# Patient Record
Sex: Female | Born: 1955
Health system: Southern US, Community
[De-identification: ages and names within clinical notes are randomized; demographics above are authoritative.]

## PROBLEM LIST (undated history)

## (undated) DIAGNOSIS — G4733 Obstructive sleep apnea (adult) (pediatric): Secondary | ICD-10-CM

## (undated) DIAGNOSIS — M1990 Unspecified osteoarthritis, unspecified site: Secondary | ICD-10-CM

## (undated) DIAGNOSIS — I10 Essential (primary) hypertension: Secondary | ICD-10-CM

## (undated) DIAGNOSIS — H269 Unspecified cataract: Secondary | ICD-10-CM

## (undated) DIAGNOSIS — R7301 Impaired fasting glucose: Secondary | ICD-10-CM

## (undated) DIAGNOSIS — D649 Anemia, unspecified: Secondary | ICD-10-CM

## (undated) DIAGNOSIS — E119 Type 2 diabetes mellitus without complications: Secondary | ICD-10-CM

## (undated) DIAGNOSIS — E785 Hyperlipidemia, unspecified: Secondary | ICD-10-CM

## (undated) DIAGNOSIS — E079 Disorder of thyroid, unspecified: Secondary | ICD-10-CM

## (undated) DIAGNOSIS — K219 Gastro-esophageal reflux disease without esophagitis: Secondary | ICD-10-CM

## (undated) DIAGNOSIS — G709 Myoneural disorder, unspecified: Secondary | ICD-10-CM

## (undated) DIAGNOSIS — E039 Hypothyroidism, unspecified: Secondary | ICD-10-CM

## (undated) DIAGNOSIS — R011 Cardiac murmur, unspecified: Secondary | ICD-10-CM

## (undated) DIAGNOSIS — G4730 Sleep apnea, unspecified: Secondary | ICD-10-CM

## (undated) HISTORY — DX: Obstructive sleep apnea (adult) (pediatric): G47.33

## (undated) HISTORY — DX: Anemia, unspecified: D64.9

## (undated) HISTORY — DX: Disorder of thyroid, unspecified: E07.9

## (undated) HISTORY — DX: Unspecified cataract: H26.9

## (undated) HISTORY — DX: Hyperlipidemia, unspecified: E78.5

## (undated) HISTORY — DX: Unspecified osteoarthritis, unspecified site: M19.90

## (undated) HISTORY — DX: Essential (primary) hypertension: I10

## (undated) HISTORY — DX: Sleep apnea, unspecified: G47.30

## (undated) HISTORY — PX: EYE SURGERY: SHX253

## (undated) HISTORY — DX: Myoneural disorder, unspecified: G70.9

## (undated) HISTORY — DX: Cardiac murmur, unspecified: R01.1

## (undated) HISTORY — DX: Type 2 diabetes mellitus without complications: E11.9

## (undated) HISTORY — PX: COLONOSCOPY: SHX174

## (undated) HISTORY — DX: Impaired fasting glucose: R73.01

## (undated) HISTORY — DX: Gastro-esophageal reflux disease without esophagitis: K21.9

---
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
## 1998-07-08 ENCOUNTER — Ambulatory Visit (HOSPITAL_COMMUNITY): Admission: RE | Admit: 1998-07-08 | Discharge: 1998-07-08 | Payer: Self-pay | Admitting: Internal Medicine

## 1999-10-16 ENCOUNTER — Encounter: Payer: Self-pay | Admitting: Internal Medicine

## 1999-10-16 ENCOUNTER — Encounter: Admission: RE | Admit: 1999-10-16 | Discharge: 1999-10-16 | Payer: Self-pay | Admitting: Internal Medicine

## 2000-03-12 ENCOUNTER — Other Ambulatory Visit: Admission: RE | Admit: 2000-03-12 | Discharge: 2000-03-12 | Payer: Self-pay | Admitting: Internal Medicine

## 2000-10-25 ENCOUNTER — Encounter: Admission: RE | Admit: 2000-10-25 | Discharge: 2000-10-25 | Payer: Self-pay | Admitting: Internal Medicine

## 2000-10-25 ENCOUNTER — Encounter: Payer: Self-pay | Admitting: Internal Medicine

## 2001-08-04 ENCOUNTER — Other Ambulatory Visit: Admission: RE | Admit: 2001-08-04 | Discharge: 2001-08-04 | Payer: Self-pay | Admitting: Internal Medicine

## 2001-08-11 ENCOUNTER — Encounter: Admission: RE | Admit: 2001-08-11 | Discharge: 2001-09-14 | Payer: Self-pay | Admitting: Internal Medicine

## 2001-11-15 ENCOUNTER — Encounter: Admission: RE | Admit: 2001-11-15 | Discharge: 2001-11-15 | Payer: Self-pay | Admitting: Internal Medicine

## 2001-11-15 ENCOUNTER — Encounter: Payer: Self-pay | Admitting: Internal Medicine

## 2002-01-24 ENCOUNTER — Encounter: Payer: Self-pay | Admitting: Internal Medicine

## 2002-03-17 ENCOUNTER — Encounter: Payer: Self-pay | Admitting: Internal Medicine

## 2002-03-17 ENCOUNTER — Ambulatory Visit (HOSPITAL_COMMUNITY): Admission: RE | Admit: 2002-03-17 | Discharge: 2002-03-17 | Payer: Self-pay | Admitting: Internal Medicine

## 2002-11-20 ENCOUNTER — Ambulatory Visit (HOSPITAL_BASED_OUTPATIENT_CLINIC_OR_DEPARTMENT_OTHER): Admission: RE | Admit: 2002-11-20 | Discharge: 2002-11-20 | Payer: Self-pay | Admitting: Internal Medicine

## 2002-11-28 ENCOUNTER — Encounter: Admission: RE | Admit: 2002-11-28 | Discharge: 2002-11-28 | Payer: Self-pay | Admitting: Internal Medicine

## 2002-11-28 ENCOUNTER — Encounter: Payer: Self-pay | Admitting: Internal Medicine

## 2003-09-20 ENCOUNTER — Encounter: Payer: Self-pay | Admitting: Family Medicine

## 2003-10-16 ENCOUNTER — Other Ambulatory Visit: Admission: RE | Admit: 2003-10-16 | Discharge: 2003-10-16 | Payer: Self-pay | Admitting: Obstetrics & Gynecology

## 2003-12-14 ENCOUNTER — Encounter: Admission: RE | Admit: 2003-12-14 | Discharge: 2003-12-14 | Payer: Self-pay | Admitting: Obstetrics & Gynecology

## 2004-10-09 ENCOUNTER — Ambulatory Visit: Payer: Self-pay | Admitting: Family Medicine

## 2004-12-17 ENCOUNTER — Other Ambulatory Visit: Admission: RE | Admit: 2004-12-17 | Discharge: 2004-12-17 | Payer: Self-pay | Admitting: Obstetrics & Gynecology

## 2004-12-17 ENCOUNTER — Encounter: Admission: RE | Admit: 2004-12-17 | Discharge: 2004-12-17 | Payer: Self-pay | Admitting: Obstetrics & Gynecology

## 2005-01-20 ENCOUNTER — Ambulatory Visit: Payer: Self-pay | Admitting: Family Medicine

## 2005-02-03 ENCOUNTER — Ambulatory Visit: Payer: Self-pay | Admitting: Family Medicine

## 2005-09-03 ENCOUNTER — Ambulatory Visit: Payer: Self-pay | Admitting: Family Medicine

## 2005-09-25 ENCOUNTER — Encounter: Payer: Self-pay | Admitting: Family Medicine

## 2005-09-25 ENCOUNTER — Ambulatory Visit (HOSPITAL_COMMUNITY): Admission: RE | Admit: 2005-09-25 | Discharge: 2005-09-25 | Payer: Self-pay | Admitting: Family Medicine

## 2005-12-09 ENCOUNTER — Encounter: Payer: Self-pay | Admitting: Family Medicine

## 2006-01-13 ENCOUNTER — Encounter: Admission: RE | Admit: 2006-01-13 | Discharge: 2006-01-13 | Payer: Self-pay | Admitting: Obstetrics & Gynecology

## 2006-01-25 ENCOUNTER — Other Ambulatory Visit: Admission: RE | Admit: 2006-01-25 | Discharge: 2006-01-25 | Payer: Self-pay | Admitting: Obstetrics & Gynecology

## 2007-01-06 ENCOUNTER — Ambulatory Visit: Payer: Self-pay | Admitting: Family Medicine

## 2007-01-06 DIAGNOSIS — D649 Anemia, unspecified: Secondary | ICD-10-CM | POA: Insufficient documentation

## 2007-01-06 DIAGNOSIS — E039 Hypothyroidism, unspecified: Secondary | ICD-10-CM | POA: Insufficient documentation

## 2007-01-06 DIAGNOSIS — I10 Essential (primary) hypertension: Secondary | ICD-10-CM | POA: Insufficient documentation

## 2007-01-06 DIAGNOSIS — E785 Hyperlipidemia, unspecified: Secondary | ICD-10-CM | POA: Insufficient documentation

## 2007-01-08 ENCOUNTER — Encounter: Payer: Self-pay | Admitting: Family Medicine

## 2007-01-10 LAB — CONVERTED CEMR LAB
AST: 14 units/L (ref 0–37)
Albumin: 4.4 g/dL (ref 3.5–5.2)
Alkaline Phosphatase: 67 units/L (ref 39–117)
BUN: 13 mg/dL (ref 6–23)
Calcium: 8.8 mg/dL (ref 8.4–10.5)
Creatinine, Ser: 0.72 mg/dL (ref 0.40–1.20)
Glucose, Bld: 65 mg/dL — ABNORMAL LOW (ref 70–99)
HCT: 45 % (ref 36.0–46.0)
HDL: 51 mg/dL (ref >39)
Hemoglobin: 13.1 g/dL (ref 12.0–15.0)
LDL Cholesterol: 74 mg/dL (ref 0–99)
MCHC: 29.1 g/dL — ABNORMAL LOW (ref 30.0–36.0)
MCV: 98.7 fL (ref 78.0–100.0)
Potassium: 3.3 meq/L — ABNORMAL LOW (ref 3.5–5.3)
RBC: 4.56 M/uL (ref 3.87–5.11)
RDW: 17.8 % — ABNORMAL HIGH (ref 11.5–14.0)
TSH: 3.115 microintl units/mL (ref 0.350–5.50)
Total CHOL/HDL Ratio: 3
Triglycerides: 139 mg/dL (ref <150)

## 2007-01-20 ENCOUNTER — Ambulatory Visit: Payer: Self-pay | Admitting: Internal Medicine

## 2007-01-25 ENCOUNTER — Encounter: Admission: RE | Admit: 2007-01-25 | Discharge: 2007-01-25 | Payer: Self-pay | Admitting: Obstetrics & Gynecology

## 2007-01-28 ENCOUNTER — Encounter: Payer: Self-pay | Admitting: Family Medicine

## 2007-02-03 ENCOUNTER — Ambulatory Visit: Payer: Self-pay | Admitting: Internal Medicine

## 2007-02-07 ENCOUNTER — Encounter: Payer: Self-pay | Admitting: Family Medicine

## 2007-02-07 ENCOUNTER — Telehealth: Payer: Self-pay | Admitting: Family Medicine

## 2007-03-02 ENCOUNTER — Other Ambulatory Visit: Admission: RE | Admit: 2007-03-02 | Discharge: 2007-03-02 | Payer: Self-pay | Admitting: Obstetrics & Gynecology

## 2007-04-05 ENCOUNTER — Encounter: Payer: Self-pay | Admitting: Family Medicine

## 2007-11-08 ENCOUNTER — Emergency Department (HOSPITAL_COMMUNITY): Admission: EM | Admit: 2007-11-08 | Discharge: 2007-11-08 | Payer: Self-pay | Admitting: Emergency Medicine

## 2007-11-10 HISTORY — PX: OTHER SURGICAL HISTORY: SHX169

## 2007-12-02 ENCOUNTER — Encounter: Admission: RE | Admit: 2007-12-02 | Discharge: 2007-12-02 | Payer: Self-pay | Admitting: Internal Medicine

## 2007-12-14 ENCOUNTER — Encounter: Admission: RE | Admit: 2007-12-14 | Discharge: 2008-01-25 | Payer: Self-pay | Admitting: Internal Medicine

## 2007-12-22 ENCOUNTER — Ambulatory Visit: Payer: Self-pay | Admitting: Family Medicine

## 2007-12-22 DIAGNOSIS — K5289 Other specified noninfective gastroenteritis and colitis: Secondary | ICD-10-CM | POA: Insufficient documentation

## 2008-01-27 ENCOUNTER — Encounter: Admission: RE | Admit: 2008-01-27 | Discharge: 2008-01-27 | Payer: Self-pay | Admitting: Obstetrics & Gynecology

## 2008-01-31 ENCOUNTER — Encounter: Payer: Self-pay | Admitting: Family Medicine

## 2008-02-06 ENCOUNTER — Encounter: Payer: Self-pay | Admitting: Family Medicine

## 2008-02-07 DIAGNOSIS — E1169 Type 2 diabetes mellitus with other specified complication: Secondary | ICD-10-CM | POA: Insufficient documentation

## 2008-02-07 DIAGNOSIS — E1122 Type 2 diabetes mellitus with diabetic chronic kidney disease: Secondary | ICD-10-CM | POA: Insufficient documentation

## 2008-02-07 DIAGNOSIS — E669 Obesity, unspecified: Secondary | ICD-10-CM | POA: Insufficient documentation

## 2008-02-07 LAB — CONVERTED CEMR LAB
BUN: 18 mg/dL (ref 6–23)
CO2: 24 meq/L (ref 19–32)
Calcium: 9 mg/dL (ref 8.4–10.5)
Chloride: 101 meq/L (ref 96–112)
Cholesterol: 152 mg/dL (ref 0–200)
Creatinine, Ser: 0.9 mg/dL (ref 0.40–1.20)
HDL: 49 mg/dL (ref >39)
Total Bilirubin: 0.5 mg/dL (ref 0.3–1.2)
Total CHOL/HDL Ratio: 3.1
VLDL: 29 mg/dL (ref 0–40)

## 2008-02-10 ENCOUNTER — Ambulatory Visit: Payer: Self-pay | Admitting: Family Medicine

## 2008-02-10 ENCOUNTER — Encounter: Admission: RE | Admit: 2008-02-10 | Discharge: 2008-02-10 | Payer: Self-pay | Admitting: Family Medicine

## 2008-02-10 DIAGNOSIS — M25569 Pain in unspecified knee: Secondary | ICD-10-CM | POA: Insufficient documentation

## 2008-03-05 ENCOUNTER — Telehealth: Payer: Self-pay | Admitting: Family Medicine

## 2008-03-08 ENCOUNTER — Telehealth: Payer: Self-pay | Admitting: Family Medicine

## 2008-03-10 ENCOUNTER — Encounter: Admission: RE | Admit: 2008-03-10 | Discharge: 2008-03-10 | Payer: Self-pay | Admitting: Family Medicine

## 2008-03-12 ENCOUNTER — Telehealth: Payer: Self-pay | Admitting: Family Medicine

## 2008-03-21 ENCOUNTER — Other Ambulatory Visit: Admission: RE | Admit: 2008-03-21 | Discharge: 2008-03-21 | Payer: Self-pay | Admitting: Obstetrics & Gynecology

## 2008-05-02 ENCOUNTER — Telehealth: Payer: Self-pay | Admitting: Family Medicine

## 2008-05-03 ENCOUNTER — Encounter: Payer: Self-pay | Admitting: Family Medicine

## 2008-05-14 ENCOUNTER — Encounter: Payer: Self-pay | Admitting: Family Medicine

## 2008-05-29 ENCOUNTER — Ambulatory Visit (HOSPITAL_BASED_OUTPATIENT_CLINIC_OR_DEPARTMENT_OTHER): Admission: RE | Admit: 2008-05-29 | Discharge: 2008-05-29 | Payer: Self-pay | Admitting: Orthopedic Surgery

## 2008-07-18 ENCOUNTER — Telehealth: Payer: Self-pay | Admitting: Family Medicine

## 2008-07-30 ENCOUNTER — Encounter: Admission: RE | Admit: 2008-07-30 | Discharge: 2008-09-14 | Payer: Self-pay | Admitting: Orthopedic Surgery

## 2009-01-28 ENCOUNTER — Encounter: Admission: RE | Admit: 2009-01-28 | Discharge: 2009-01-28 | Payer: Self-pay | Admitting: Obstetrics & Gynecology

## 2009-01-29 ENCOUNTER — Encounter: Payer: Self-pay | Admitting: Family Medicine

## 2009-02-26 ENCOUNTER — Telehealth (INDEPENDENT_AMBULATORY_CARE_PROVIDER_SITE_OTHER): Payer: Self-pay | Admitting: *Deleted

## 2009-03-18 ENCOUNTER — Encounter: Payer: Self-pay | Admitting: Family Medicine

## 2009-03-19 ENCOUNTER — Ambulatory Visit: Payer: Self-pay | Admitting: Family Medicine

## 2009-03-19 LAB — CONVERTED CEMR LAB
ALT: 20 units/L (ref 0–35)
AST: 17 units/L (ref 0–37)
Alkaline Phosphatase: 62 units/L (ref 39–117)
Cholesterol: 189 mg/dL (ref 0–200)
Creatinine, Ser: 0.83 mg/dL (ref 0.40–1.20)
HCT: 36.8 % (ref 36.0–46.0)
LDL Cholesterol: 92 mg/dL (ref 0–99)
MCHC: 32.6 g/dL (ref 30.0–36.0)
MCV: 89.5 fL (ref 78.0–100.0)
Platelets: 202 10*3/uL (ref 150–400)
RDW: 13.8 % (ref 11.5–15.5)
Sodium: 139 meq/L (ref 135–145)
TSH: 1.899 microintl units/mL (ref 0.350–4.500)
Total Bilirubin: 0.5 mg/dL (ref 0.3–1.2)
Total CHOL/HDL Ratio: 3.4
VLDL: 41 mg/dL — ABNORMAL HIGH (ref 0–40)

## 2009-03-21 ENCOUNTER — Encounter: Payer: Self-pay | Admitting: Family Medicine

## 2009-03-21 LAB — CONVERTED CEMR LAB: Calcium Ionized: 1.25 mmol/L (ref 1.12–1.32)

## 2009-05-08 ENCOUNTER — Other Ambulatory Visit: Admission: RE | Admit: 2009-05-08 | Discharge: 2009-05-08 | Payer: Self-pay | Admitting: Obstetrics & Gynecology

## 2010-02-04 ENCOUNTER — Encounter: Admission: RE | Admit: 2010-02-04 | Discharge: 2010-02-04 | Payer: Self-pay | Admitting: Obstetrics & Gynecology

## 2010-02-05 LAB — HM MAMMOGRAPHY: HM Mammogram: NORMAL

## 2010-03-11 ENCOUNTER — Telehealth: Payer: Self-pay | Admitting: Family Medicine

## 2010-03-14 ENCOUNTER — Encounter: Payer: Self-pay | Admitting: Family Medicine

## 2010-03-16 LAB — CONVERTED CEMR LAB
ALT: 20 units/L (ref 0–35)
Alkaline Phosphatase: 61 units/L (ref 39–117)
CO2: 23 meq/L (ref 19–32)
Cholesterol: 221 mg/dL — ABNORMAL HIGH (ref 0–200)
Creatinine, Ser: 0.83 mg/dL (ref 0.40–1.20)
Total Bilirubin: 0.5 mg/dL (ref 0.3–1.2)
Total CHOL/HDL Ratio: 4.7
VLDL: 43 mg/dL — ABNORMAL HIGH (ref 0–40)

## 2010-03-18 ENCOUNTER — Ambulatory Visit: Payer: Self-pay | Admitting: Family Medicine

## 2010-03-18 ENCOUNTER — Encounter: Admission: RE | Admit: 2010-03-18 | Discharge: 2010-03-18 | Payer: Self-pay | Admitting: Family Medicine

## 2010-03-18 DIAGNOSIS — M75102 Unspecified rotator cuff tear or rupture of left shoulder, not specified as traumatic: Secondary | ICD-10-CM | POA: Insufficient documentation

## 2010-03-18 DIAGNOSIS — M25519 Pain in unspecified shoulder: Secondary | ICD-10-CM | POA: Insufficient documentation

## 2010-03-18 DIAGNOSIS — R209 Unspecified disturbances of skin sensation: Secondary | ICD-10-CM | POA: Insufficient documentation

## 2010-05-22 ENCOUNTER — Other Ambulatory Visit: Admission: RE | Admit: 2010-05-22 | Discharge: 2010-05-22 | Payer: Self-pay | Admitting: Obstetrics & Gynecology

## 2010-06-17 ENCOUNTER — Telehealth: Payer: Self-pay | Admitting: Family Medicine

## 2010-07-02 ENCOUNTER — Encounter: Admission: RE | Admit: 2010-07-02 | Discharge: 2010-08-19 | Payer: Self-pay | Admitting: Family Medicine

## 2010-07-04 ENCOUNTER — Encounter: Payer: Self-pay | Admitting: Family Medicine

## 2010-07-31 ENCOUNTER — Ambulatory Visit: Payer: Self-pay | Admitting: Family Medicine

## 2010-08-01 ENCOUNTER — Encounter: Payer: Self-pay | Admitting: Family Medicine

## 2010-08-01 LAB — CONVERTED CEMR LAB
Alkaline Phosphatase: 81 units/L (ref 39–117)
BUN: 21 mg/dL (ref 6–23)
Glucose, Bld: 119 mg/dL — ABNORMAL HIGH (ref 70–99)
HDL: 43 mg/dL (ref >39)
LDL Cholesterol: 106 mg/dL — ABNORMAL HIGH (ref 0–99)
Sodium: 142 meq/L (ref 135–145)
Total Bilirubin: 0.5 mg/dL (ref 0.3–1.2)
Triglycerides: 216 mg/dL — ABNORMAL HIGH (ref <150)
VLDL: 43 mg/dL — ABNORMAL HIGH (ref 0–40)

## 2010-08-07 ENCOUNTER — Encounter: Payer: Self-pay | Admitting: Family Medicine

## 2010-08-18 ENCOUNTER — Encounter: Payer: Self-pay | Admitting: Family Medicine

## 2010-08-28 ENCOUNTER — Encounter (INDEPENDENT_AMBULATORY_CARE_PROVIDER_SITE_OTHER): Payer: Self-pay | Admitting: *Deleted

## 2010-09-02 ENCOUNTER — Ambulatory Visit (HOSPITAL_BASED_OUTPATIENT_CLINIC_OR_DEPARTMENT_OTHER): Admission: RE | Admit: 2010-09-02 | Discharge: 2010-09-02 | Payer: Self-pay | Admitting: Family Medicine

## 2010-09-02 ENCOUNTER — Encounter: Payer: Self-pay | Admitting: Family Medicine

## 2010-09-26 ENCOUNTER — Telehealth: Payer: Self-pay | Admitting: Family Medicine

## 2010-09-26 DIAGNOSIS — G4733 Obstructive sleep apnea (adult) (pediatric): Secondary | ICD-10-CM | POA: Insufficient documentation

## 2010-10-20 ENCOUNTER — Ambulatory Visit: Payer: Self-pay | Admitting: Internal Medicine

## 2010-10-20 DIAGNOSIS — K602 Anal fissure, unspecified: Secondary | ICD-10-CM | POA: Insufficient documentation

## 2010-11-13 ENCOUNTER — Encounter: Payer: Self-pay | Admitting: Internal Medicine

## 2010-11-26 ENCOUNTER — Encounter: Payer: Self-pay | Admitting: Family Medicine

## 2010-12-09 NOTE — Letter (Signed)
Summary: Letter to Dr Liliane Bade, [facility]  Page January 24, 2002    Madison Hickman, M.D.  Theatre stage manager at [employer] [employer]  [address], [place] [address]    RE:  Lajeana, Strough ([id number])    Dear Shawna Orleans:    Thank you, so much, for sending Ms. Sansom to [place] for discussion of colorectal screening.  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  Thank you for sending her office records to [place] for review.    Jerie's father was found to have a colon polyp at the age of 15, which had high grade dysplasia.  His mother had colon cancer, and died of it at the age of 18, after being diagnosed one year prior to that.  Ladrea has occasional diarrhea, usually at the time of her periods.  She also has intermittent, sharp, left, lower quadrant abdominal pain, which also occurs during her periods, and is usually relieved by evacuation of the stool.  She was given hemoccult cards in the past, but never completed them. Her weight has been stable, somewhat overweight.  She is being treated for hypertension.  She does not smoke and does not drink.   Her medications include Nexium for esophageal reflux.    On physical exam, blood pressure 160/100, pulse 78 and weight 221 pounds.  She appeared healthy and in no distress.  Skin was warm and dry. Sclerae were non-icteric.  Lungs were clear to auscultation.  COR with normal S1, normal S2.  Abdomen was soft and relaxed, with no palpable tenderness or fullness.  Rectal exam was not done today.    IMPRESSION:  Onna is a 55 year old, white female who has a strong family history of colon cancer in her father, as well as paternal grandmother.  Because both of these neoplasms were discovered in 60's, because of the possibility of genetic predisposition to colorectal cancer, I agree that Alanni would be a good candidate for screening colonoscopy.     We discussed preparation  for the colonoscopy, as well as indications, and the procedure itself.  She also may be a candidate for genetic counseling, which will be soon available at [facility], and she was interested in that.  We have scheduled her for colonoscopy for the next few weeks, using the routine preparation, and I will be in touch with you concerning the results of her colonoscopy.    Thank you, so much, for your referral.   With Best Regards,    Hedwig Morton. Juanda Chance, M.D.  [id number] D:  2002-01-24 00:00:00.0; T:  ; Job [phone]

## 2010-12-09 NOTE — Assessment & Plan Note (Signed)
Summary: CPE--NO PAP   Vital Signs:  Patient profile:   55 year old female Height:      66.75 inches Weight:      220 pounds BMI:     34.84 Pulse rate:   79 / minute BP sitting:   122 / 65  (left arm) Cuff size:   large  Vitals Entered By: Kathlene November (Mar 18, 2010 3:49 PM) CC: CPE no pap   Primary Care Provider:  Linford Arnold, C  CC:  CPE no pap.  History of Present Illness: Having rally bad cramps in her legs so stopped her simvastain about 2 weeks ago. Had tried increasing her potassium and that didn't help. Has felt better since off the statin.  here to review the labs as well.   Insomnia:  Can fall alseep but can't stay asleep so on Blach Cohash. Periods are getting less frequent.  Feels achy al over when gets out of bed in the morning. Has been having more low back pain. Both hands go numb at night.  Having some neck pain. Bought a new matress adn still not better.  Still having her left shoulder pain.  Using ASA and motrin for pain relief.  Havng daytime sleepiness.  NOt sure if she snores.  Had had a sleep study in the past.   Current Medications (verified): 1)  Labetalol Hcl 100 Mg Tabs (Labetalol Hcl) .... Take 1 Tablet By Mouth Two Times A Day 2)  Synthroid 125 Mcg  Tabs (Levothyroxine Sodium) .... Take 1 Tablet By Mouth Once A Day 3)  Benicar Hct 40-25 Mg  Tabs (Olmesartan Medoxomil-Hctz) .... Take 1 Tablet By Mouth Once A Day 4)  Nexium 40 Mg Cpdr (Esomeprazole Magnesium) .... Take 1 Tablet By Mouth Once A Day 5)  Celebrex 200 Mg Caps (Celecoxib) .Marland Kitchen.. 1 By Mouth Daily 6)  Fish Oil 1000 Mg Caps (Omega-3 Fatty Acids) .... Take One Capsule By Mouth Three Times A Day 7)  Vitamin D3 2000 Unit Caps (Cholecalciferol) .... Take One Tablet By Mouth Once A Day 8)  St Johns Wort 150 Mg Caps ([address] Johns Wort) .... Take One Tablet By Mouth Twice A Day 9)  Black Cohosh 540 Mg Caps (Black Cohosh) .... Take One Tablet  By Mouth Three Times A Day 10)  Glucosamine Sulfate 750 Mg Caps  (Glucosamine Sulfate) .... Take One Tablet By Mouth Twice A Dday  Allergies (verified): No Known Drug Allergies  Comments:  Nurse/Medical Assistant: The patient's medications and allergies were reviewed with the patient and were updated in the Medication and Allergy Lists. Kathlene November (Mar 18, 2010 3:52 PM)  Past History:  Past Surgical History: Last updated: 03/19/2009 Upper palate of mouth repaired after a large tear 1961 Torn ligament of right shoulder repaired 1975 Right knee surgery, arthroscopy for torn medial meniscus  2009  Family History: Last updated: 03/18/2010 Father dx colon Ca age 89, PGM with colon Ca, MI, stroke GM w/ DM Mom w/ hi cholesterol  Past Medical History: Gyn - Dr.Lavoie.   Family History: Father dx colon Ca age 39, PGM with colon Ca, MI, stroke GM w/ DM Mom w/ hi cholesterol  Social History: Reviewed history from 03/19/2009 and no changes required. 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  No children.   Never Smoked Alcohol use-yes Drug use-no Regular exercise-no  Review of Systems       The patient complains of weight gain.  The patient denies anorexia, fever, weight loss, vision loss, decreased  hearing, hoarseness, chest pain, syncope, dyspnea on exertion, peripheral edema, prolonged cough, headaches, hemoptysis, abdominal pain, melena, hematochezia, severe indigestion/heartburn, hematuria, incontinence, genital sores, muscle weakness, suspicious skin lesions, transient blindness, difficulty walking, depression, unusual weight change, abnormal bleeding, enlarged lymph nodes, and breast masses.    Physical Exam  General:  Well-developed,well-nourished,in no acute distress; alert,appropriate and cooperative throughout examination Head:  Normocephalic and atraumatic without obvious abnormalities. No apparent alopecia or balding. Eyes:  No corneal or conjunctival inflammation noted. EOMI. Perrla.Wears glasses.  Ears:  External ear  exam shows no significant lesions or deformities.  Otoscopic examination reveals clear canals, tympanic membranes are intact bilaterally without bulging, retraction, inflammation or discharge. Hearing is grossly normal bilaterally. Nose:  External nasal examination shows no deformity or inflammation. Nasal mucosa are pink and moist without lesions or exudates. Mouth:  Oral mucosa and oropharynx without lesions or exudates.  Teeth in good repair. Neck:  No deformities, masses, or tenderness noted. Chest Wall:  No deformities, masses, or tenderness noted. Lungs:  Normal respiratory effort, chest expands symmetrically. Lungs are clear to auscultation, no crackles or wheezes. Heart:  Normal rate and regular rhythm. S1 and S2 normal without gallop, murmur, click, rub or other extra sounds. Abdomen:  Bowel sounds positive,abdomen soft and non-tender without masses, organomegaly or hernias noted. Msk:  No deformity or scoliosis noted of thoracic or lumbar spine.   Pulses:  R and L carotid,radial,dorsalis pedis and posterior tibial pulses are full and equal bilaterally Extremities:  No clubbing, cyanosis, edema, or deformity noted with normal full range of motion of all joints.   Neurologic:  No cranial nerve deficits noted. Station and gait are normal. DTRs are symmetrical throughout. Sensory, motor and coordinative functions appear intact. Skin:  no rashes.   Cervical Nodes:  No lymphadenopathy noted Psych:  Cognition and judgment appear intact. Alert and cooperative with normal attention span and concentration. No apparent delusions, illusions, hallucinations   Impression & Recommendations:  Problem # 1:  EXAMINATION, ROUTINE MEDICAL (ICD-V70.0) Dsicussed importance of weight loss, regular exercise, controlling BP and cholesterol.  REcommend restart exercise program Keep f/u with Dr. Seymour Bars fo her pap.   colonoscopy and mammo and vaccines are up to date.   Problem # 2:  HYPERLIPIDEMIA NEC/NOS  (ICD-272.4) Recheck in 3 months. Will change to lipitor. If muscle camping in her legs recur then please call.  The following medications were removed from the medication list:    Simvastatin 40 Mg Tabs (Simvastatin) .Marland Kitchen... Take 1 tablet by mouth once a day at bedtime Her updated medication list for this problem includes:    Lipitor 40 Mg Tabs (Atorvastatin calcium) .Marland Kitchen... Take 1 tablet by mouth once a day at bedtime  Problem # 3:  PARESTHESIA, HANDS (ICD-782.0) Discussed if in both hands likely from her neck.  Will get an xray to see if OA or any evidence of disck herniation.  If normal then consider ortho referrral for further evaluation.  Orders: T-DG Cervical Spine 2-3 Views (54098)  Problem # 4:  SHOULDER PAIN, LEFT (ICD-719.41) hx of ligament repair years ago but still cuasing alot of pain. She has done PT on this shoulder in the past.  Her updated medication list for this problem includes:    Celebrex 200 Mg Caps (Celecoxib) .Marland Kitchen... 1 by mouth daily  Orders: T-DG Shoulder*L* (73030)  Complete Medication List: 1)  Labetalol Hcl 100 Mg Tabs (Labetalol hcl) .... Take 1 tablet by mouth two times a day 2)  Synthroid 137 Mcg  Tabs (Levothyroxine sodium) .... Take 1 tablet by mouth once a day 3)  Benicar Hct 40-25 Mg Tabs (Olmesartan medoxomil-hctz) .... Take 1 tablet by mouth once a day 4)  Nexium 40 Mg Cpdr (Esomeprazole magnesium) .... Take 1 tablet by mouth once a day 5)  Celebrex 200 Mg Caps (Celecoxib) .Marland Kitchen.. 1 by mouth daily 6)  Fish Oil 1000 Mg Caps (Omega-3 fatty acids) .... Take one capsule by mouth three times a day 7)  Vitamin D3 2000 Unit Caps (Cholecalciferol) .... Take one tablet by mouth once a day 8)  Black Cohosh 540 Mg Caps (Black cohosh) .... Take one tablet  by mouth three times a day 9)  Glucosamine Sulfate 750 Mg Caps (Glucosamine sulfate) .... Take one tablet by mouth twice a dday 10)  Lipitor 40 Mg Tabs (Atorvastatin calcium) .... Take 1 tablet by mouth once a day at  bedtime  Patient Instructions: 1)  Encourage regular exercise and weight loss 2)  Recheck lipids and thyroid in 3 months.   Prescriptions: SYNTHROID 137 MCG TABS (LEVOTHYROXINE SODIUM) Take 1 tablet by mouth once a day Brand medically necessary #90 x 0   Entered and Authorized by:   Nani Gasser MD   Signed by:   Nani Gasser MD on 03/18/2010   Method used:   Electronically to        [facility]* (retail)       [address]       [address] Shipping/mailing       [place], [place]  [address]       Ph: [phone]       Fax: [phone]   RxID:   [id number] LIPITOR 40 MG TABS (ATORVASTATIN CALCIUM) Take 1 tablet by mouth once a day at bedtime  #90 x 0   Entered and Authorized by:   Nani Gasser MD   Signed by:   Nani Gasser MD on 03/18/2010   Method used:   Electronically to        [facility]* (retail)       [address]       [address] Shipping/mailing       [place], [place]  [address]       Ph: [phone]       Fax: [phone]   RxID:   [id number]   Flex Sig Next Due:  Not Indicated Hemoccult Next Due:  Not Indicated

## 2010-12-09 NOTE — Op Note (Signed)
Summary: COLON                    [place]  Patient:    Alexis Mcneil, Alexis Mcneil Visit Number: [id number] MRN: [id number]          Service Type: END Location: ENDO Attending Physician:  Mervin Hack Dictated by:   Hedwig Morton. Juanda Chance, M.D. [facility] Proc. Date: 03/17/02 Admit Date:  03/17/2002 Discharge Date: 03/17/2002   CC:         Darius Bump, M.D.   Operative Report  PROCEDURE:  Colonoscopy.  ENDOSCOPIST:  Hedwig Morton. Juanda Chance, M.D.  INDICATIONS:  This 55 year old white female has strong family history of colon cancer in her father who had high grade dysplasia and a polyp, her grandmother who had colon cancer at the age of 8.  The patient herself has diarrhea on an intermittent basis and intermittent left lower quadrant abdominal pain.  She is undergoing colonoscopy to explain the symptoms as well as for screening.  ENDOSCOPE:  Fujinon single channel videoscope.  SEDATION:  Versed 10 mg IV, fentanyl 100 mcg IV.  DESCRIPTION OF PROCEDURE:  The Fujinon single channel videoscope was passed under direct vision to the sigmoid colon.  The patient was monitored by pulse oximeter.  Her oxygen saturations were normal.  Her prep was adequate.  Anal canal and rectal ampulla was unremarkable.  Sigmoid colon showed large thickened folds but no definite diverticula.  The scope was passed with some difficulty through the sigmoid colon because of its tortuosity and enlarged folds.  Descending colon, splenic flexure and transverse colon were unremarkable.  The hepatic flexure and ascending colon was also normal and was traversed without difficulty.  The cecum, ileocecal valve and the appendiceal opening were normal.  The colonoscope was then retracted.  The colon was decompressed.  There were no polyps.  IMPRESSION:  Prediverticular changes of the left colon.  Otherwise normal exam.  PLAN: 1. The patients left lower quadrant abdominal pain is likely related to    an  irritable bowel syndrome due to prediverticular changes of the left    colon.  She is to stay on a high fiber diet and NuLev 0.125 mg sublingual    on a p.r.n. basis. 2. Repeat colonoscopy in five to seven years. Dictated by:   Hedwig Morton. Juanda Chance, M.D. [facility] Attending Physician:  Mervin Hack DD:  03/19/02 TD:  03/19/02 Job: [phone] [id number]  This report was created from the original endoscopy report, which was reviewed and signed by the above listed endoscopist.

## 2010-12-09 NOTE — Procedures (Signed)
Summary: COLON   Colonoscopy  Procedure date:  02/03/2007  Findings:      Location:   Endoscopy Center.    Procedures Next Due Date:    Colonoscopy: 02/2012 Patient Name: Alexis Mcneil, Alexis Mcneil MRN:  Procedure Procedures: Colonoscopy CPT: [address].  Personnel: Endoscopist: Markita Stcharles L. Juanda Chance, MD.  Exam Location: Exam performed in Outpatient Clinic. Outpatient  Patient Consent: Procedure, Alternatives, Risks and Benefits discussed, consent obtained, from patient. Consent was obtained by the RN.  Indications  Surveillance of: 2003.  Increased Risk Screening: For family history of colorectal neoplasia, in  parent grandparent  History  Current Medications: Patient is not currently taking Coumadin.  Pre-Exam Physical: Performed Feb 03, 2007. Entire physical exam was normal.  Comments: Pt. history reviewed/updated, physical exam performed prior to initiation of sedation?yes Exam Exam: Extent of exam reached: Cecum, extent intended: Cecum.  The cecum was identified by appendiceal orifice and IC valve. Colon retroflexion performed. Images taken. ASA Classification: I. Tolerance: good.  Monitoring: Pulse and BP monitoring, Oximetry used. Supplemental O2 given.  Colon Prep Used Miralax for colon prep. Prep results: good.  Sedation Meds: Patient assessed and found to be appropriate for moderate (conscious) sedation. Fentanyl 100 mcg. given IV. Versed 10 mg. given IV.  Findings - NORMAL EXAM: Cecum.  - OTHER FINDING: Sigmoid Colon. Comments: prep induced mucosal petechiae.  - NORMAL EXAM: to Rectum.    Comments: scope withdrawal time 6:00 min Assessment Normal examination.  Comments: no polyps Events  Unplanned Interventions: No intervention was required.  Unplanned Events: There were no complications. Plans Patient Education: Patient given standard instructions for: Patient instructed to get routine colonoscopy every 5 years.  Disposition: After procedure  patient sent to recovery. After recovery patient sent home.   This report was created from the original endoscopy report, which was reviewed and signed by the above listed endoscopist.

## 2010-12-09 NOTE — Letter (Signed)
Summary: Generic Letter  [facility]  [address], [address]   [place], [place] [address]   Phone: [phone]  Fax: [phone]    08/07/2010  BRUNETTE LAVALLE [address] [place], [place]  [address]  Dear Ms. Laurell Josephs,   We have received your lab results back and have been unable to reach you. Your Glucose is 119- very borderline for diabetes. LDL is 106 which is better. Would still like it to be under 100- would you be ok with increasing the Lipitor to 80mg  daily? If so please call our office and let us know so we can send a prescription sent to your pharmacy. Triglycerides are still high. Make sure still taking the Fish Oil. Going up on the Lipitor will improve the LDL and the TG. Thyroid looks great- recheck in 6 months. Make sure you are getting regular exercise.        Sincerely,   Nani Gasser, MD

## 2010-12-09 NOTE — Miscellaneous (Signed)
Summary: PT Initial Summary/[facility]  PT Initial [facility]   Imported By: Lanelle Bal 07/16/2010 10:50:08  _____________________________________________________________________  External Attachment:    Type:   Image     Comment:   External Document

## 2010-12-09 NOTE — Assessment & Plan Note (Signed)
Summary: F/U shoulder, HTN, etc   Vital Signs:  Patient profile:   55 year old female Height:      66.75 inches Weight:      227 pounds Pulse rate:   76 / minute BP sitting:   113 / 61  (right arm) Cuff size:   large  Vitals Entered By: Avon Gully CMA, Duncan Dull) (July 31, 2010 3:09 PM)  Serial Vital Signs/Assessments:                                PEF    PreRx  PostRx Time      O2 Sat  O2 Type     L/min  L/min  L/min   By 3:53 PM                       250    350    380     Andrea McCrimmon CMA, (AAMA)  Comments: 3:53 PM post peak flow By: Avon Gully CMA, (AAMA)   CC: f/u meds, Hypertension Management   Primary Care Provider:  Linford Arnold, C  CC:  f/u meds and Hypertension Management.  History of Present Illness: Pain and mobility are waxing and waning. Worse if reaches out or it lifts arm above her head. Feels improved about 30% with PT and almost done with her PT. Has another session this afternoon.    Daytime sleepiness. She wonders if could be her BP medication.  Had a sleep study 10 + years ago that showed mild apnea. Recnelty told she is a heavy snorer.    Hypertension History:      She denies headache, chest pain, palpitations, dyspnea with exertion, orthopnea, PND, peripheral edema, visual symptoms, neurologic problems, syncope, and side effects from treatment.  She notes no problems with any antihypertensive medication side effects.        Positive major cardiovascular risk factors include hyperlipidemia and hypertension.  Negative major cardiovascular risk factors include female age less than 77 years old, negative family history for ischemic heart disease, and non-tobacco-user status.     Current Medications (verified): 1)  Labetalol Hcl 100 Mg Tabs (Labetalol Hcl) .... Take 1 Tablet By Mouth Two Times A Day 2)  Synthroid 137 Mcg Tabs (Levothyroxine Sodium) .... Take 1 Tablet By Mouth Once A Day 3)  Benicar Hct 40-25 Mg  Tabs (Olmesartan  Medoxomil-Hctz) .... Take 1 Tablet By Mouth Once A Day 4)  Nexium 40 Mg Cpdr (Esomeprazole Magnesium) .... Take 1 Tablet By Mouth Once A Day 5)  Celebrex 200 Mg Caps (Celecoxib) .Marland Kitchen.. 1 By Mouth Daily 6)  Fish Oil 1000 Mg Caps (Omega-3 Fatty Acids) .... Take One Capsule By Mouth Three Times A Day 7)  Vitamin D3 2000 Unit Caps (Cholecalciferol) .... Take One Tablet By Mouth Once A Day 8)  Black Cohosh 540 Mg Caps (Black Cohosh) .... Take One Tablet  By Mouth Three Times A Day 9)  Glucosamine Sulfate 750 Mg Caps (Glucosamine Sulfate) .... Take One Tablet By Mouth Twice A Dday 10)  Lipitor 40 Mg Tabs (Atorvastatin Calcium) .... Take 1 Tablet By Mouth Once A Day At Bedtime  Allergies (verified): No Known Drug Allergies  Comments:  Nurse/Medical Assistant: The patient's medications and allergies were reviewed with the patient and were updated in the Medication and Allergy Lists. 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   Never Smoked Alcohol use-yes Drug use-no Regular exercise-no  Past Surgical History: Reviewed history from 03/19/2009 and no changes required. Upper palate of mouth repaired after a large tear 1961 Torn ligament of right shoulder repaired 1975 Right knee surgery, arthroscopy for torn medial meniscus  2009  Social History: Reviewed history from 03/19/2009 and no changes required. 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   Never Smoked Alcohol use-yes Drug use-no Regular exercise-no  Physical Exam  General:  Well-developed,well-nourished,in no acute distress; alert,appropriate and cooperative throughout examination Lungs:  Normal respiratory effort, chest expands symmetrically. Lungs are clear to auscultation, no crackles or wheezes. Heart:  Normal rate and regular rhythm. S1 and S2 normal without gallop,  murmur, click, rub or other extra sounds. Msk:  Right shoulder with dec rom.  Extension to about 95 degrees.  Skin:  no rashes.   Psych:  Cognition and judgment appear intact. Alert and cooperative with normal attention span and concentration. No apparent delusions, illusions, hallucinations   Impression & Recommendations:  Problem # 1:  SHOULDER PAIN, LEFT (ICD-719.41) Has almost completed PT and feels she is 30% better. I really think she should see ortho since still dec ROM with extension. May need an MRI with arthrogram so rec she f/u wiht Dr. Thurston Hole.   Her updated medication list for this problem includes:    Celebrex 200 Mg Caps (Celecoxib) .Marland Kitchen... 1 by mouth daily  Problem # 2:  SNORING (ICD-786.09) I don't think her excessive daytime sleepiness is from her med.  Would like to refer her for OSA testing.  Will schedule at [facility].   Her updated medication list for this problem includes:    Labetalol Hcl 100 Mg Tabs (Labetalol hcl) .Marland Kitchen... Take 1 tablet by mouth two times a day    Benicar Hct 40-25 Mg Tabs (Olmesartan medoxomil-hctz) .Marland Kitchen... Take 1 tablet by mouth once a day  Orders: Sleep Disorder Referral (Sleep Disorder)  Problem # 3:  HYPERTENSION, BENIGN ESSENTIAL (ICD-401.1) Assessment: Improved Cut the labetolol in half and monitor BP. She is actually on the lower side today.  Her updated medication list for this problem includes:    Labetalol Hcl 100 Mg Tabs (Labetalol hcl) .Marland Kitchen... Take 1 tablet by mouth two times a day    Benicar Hct 40-25 Mg Tabs (Olmesartan medoxomil-hctz) .Marland Kitchen... Take 1 tablet by mouth once a day  Complete Medication List: 1)  Labetalol Hcl 100 Mg Tabs (Labetalol hcl) .... Take 1 tablet by mouth two times a day 2)  Synthroid 137 Mcg Tabs (Levothyroxine sodium) .... Take 1 tablet by mouth once a day 3)  Benicar Hct 40-25 Mg Tabs (Olmesartan medoxomil-hctz) .... Take 1 tablet by mouth once a day 4)  Nexium 40 Mg Cpdr (Esomeprazole magnesium) .... Take 1 tablet by  mouth once a day 5)  Celebrex 200 Mg Caps (Celecoxib) .Marland Kitchen.. 1 by mouth daily 6)  Fish Oil 1000 Mg Caps (Omega-3 fatty acids) .... Take one capsule by mouth three times a day 7)  Vitamin D3 2000 Unit Caps (Cholecalciferol) .... Take one tablet by mouth once a day 8)  Glucosamine Sulfate 750 Mg Caps (Glucosamine sulfate) .... Take one tablet by mouth twice a dday 9)  Lipitor 40 Mg Tabs (Atorvastatin calcium) .... Take 1 tablet by mouth once a day at bedtime  Hypertension Assessment/Plan:      The patient's hypertensive risk group is category B:  At least one risk factor (excluding diabetes) with no target organ damage.  Her calculated 10 year risk of coronary heart disease is 5 %.  Today's blood pressure is 113/61.  Her blood pressure goal is < 140/90.  Patient Instructions: 1)  Call Dr. Sherene Sires Office to evaluate your shoulder.  2)  we will call you iwth your lab result 3)  Please schedule a follow-up appointment in 2-3 months.

## 2010-12-09 NOTE — Progress Notes (Signed)
Summary: Sleep study results.   Phone Note Outgoing Call   Summary of Call: Call pt: Sleep study showed moderately sever sleep apnea.  They did rec a mask with 7cm water pressure with a heated humidifier.  We can arrange this through [employer] or a [facility]. Then she needs a f/u with me in 1 month after she gets her equipment, to see how she is doing.  Initial call taken by: Nani Gasser MD,  September 26, 2010 12:05 PM  Follow-up for Phone Call        left message for pt to call back Follow-up by: Avon Gully CMA, Duncan Dull),  September 26, 2010 12:45 PM  Additional Follow-up for Phone Call Additional follow up Details #1::        left message to call back Additional Follow-up by: Avon Gully CMA, Duncan Dull),  September 29, 2010 9:19 AM  New Problems: SLEEP APNEA, OBSTRUCTIVE (ICD-327.23)   Additional Follow-up for Phone Call Additional follow up Details #2::    pt notified of results Follow-up by: Avon Gully CMA, Duncan Dull),  September 29, 2010 10:27 AM  New Problems: SLEEP APNEA, OBSTRUCTIVE (ICD-327.23)

## 2010-12-09 NOTE — Progress Notes (Signed)
Summary: Benicar, Omeprazole  Phone Note Call from Patient Call back at Home Phone [phone]   Caller: Patient Call For: Nani Gasser MD Summary of Call: pt calls and states the BP med you gave her samples of at [facility] is working. Please send to the [facility] along with her PPI med Initial call taken by: Kathlene November,  March 05, 2008 8:31 AM      Prescriptions: PRILOSEC OTC 20 MG TBEC (OMEPRAZOLE MAGNESIUM) Take 1 tablet by mouth once a day  #90 x 1   Entered and Authorized by:   Nani Gasser MD   Signed by:   Nani Gasser MD on 03/05/2008   Method used:   Electronically sent to ...       [facility]*       [address]       [address] [place] [address]       [place], [place]  [address]       Ph: [phone]       Fax: [phone]   RxID:   [id number] BENICAR HCT 40-25 MG  TABS (OLMESARTAN MEDOXOMIL-HCTZ) Take 1 tablet by mouth once a day  #30 x 5   Entered and Authorized by:   Nani Gasser MD   Signed by:   Nani Gasser MD on 03/05/2008   Method used:   Electronically sent to ...       [facility]*       [address]       [address]       [place], [place]  [address]       Ph: [phone]       Fax: [phone]   RxID:   [id number]

## 2010-12-09 NOTE — Miscellaneous (Signed)
Summary: PT Discharge/[facility]  PT Discharge/[facility]   Imported By: Lanelle Bal 08/29/2010 14:34:49  _____________________________________________________________________  External Attachment:    Type:   Image     Comment:   External Document

## 2010-12-09 NOTE — Progress Notes (Signed)
Summary: Lab orders for CPE  Phone Note Call from Patient   Caller: Patient Summary of Call: Dr.Metheney    Patient is sch to have her CPE with you before you go out, her appt is 03-18-10 at 4:00, she said she can get her labs done at her job so she want to know if she can get her lab orders. Initial call taken by: Vanessa [place],  Mar 11, 2010 11:51 AM  Follow-up for Phone Call        slip printed.  Follow-up by: Nani Gasser MD,  Mar 11, 2010 1:00 PM  Additional Follow-up for Phone Call Additional follow up Details #1::        pt notified. kJ [id number] Additional Follow-up by: Kathlene November,  Mar 11, 2010 1:07 PM

## 2010-12-09 NOTE — Letter (Signed)
Summary: New Patient letter  [facility]  [address] [place], [place] [address]   Phone: [phone]  Fax: [phone]       08/28/2010 MRN: [id number]  Alexis Mcneil [address] [place], [place]  [address]  Dear Ms. Laurell Josephs,  Welcome to the Gastroenterology Division at [facility].    You are scheduled to see Dr. Juanda Chance on 10/20/2010 at 2:45PM on the 3rd floor at [facility], [address]  We ask that you try to arrive at our office 15 minutes prior to your appointment time to allow for check-in.  We would like you to complete the enclosed self-administered evaluation form prior to your visit and bring it with you on the day of your appointment.  We will review it with you.  Also, please bring a complete list of all your medications or, if you prefer, bring the medication bottles and we will list them.  Please bring your insurance card so that we may make a copy of it.  If your insurance requires a referral to see a specialist, please bring your referral form from your primary care physician.  Co-payments are due at the time of your visit and may be paid by cash, check or credit card.     Your office visit will consist of a consult with your physician (includes a physical exam), any laboratory testing he/she may order, scheduling of any necessary diagnostic testing (e.g. x-ray, ultrasound, CT-scan), and scheduling of a procedure (e.g. Endoscopy, Colonoscopy) if required.  Please allow enough time on your schedule to allow for any/all of these possibilities.    If you cannot keep your appointment, please call [phone] to cancel or reschedule prior to your appointment date.  This allows [place] the opportunity to schedule an appointment for another patient in need of care.  If you do not cancel or reschedule by 5 p.m. the business day prior to your appointment date, you will be charged a $50.00 late cancellation/no-show fee.    Thank you for choosing  [facility] for your medical needs.  We appreciate the opportunity to care for you.  Please visit [place] at our website  to learn more about our practice.                     Sincerely,                                                             The Gastroenterology Division

## 2010-12-09 NOTE — Progress Notes (Signed)
Summary: Wants PT for shoulder  Phone Note Call from Patient Call back at Work Phone [phone]   Caller: Patient Call For: Nani Gasser MD Summary of Call: Pt calls and states still having some problems with her shoulder- sen ortho and said spurring but now she thinks it feels more like muscle and would like a referral to do PT. Would like to use the one in our building Initial call taken by: Kathlene November,  June 17, 2010 9:24 AM  Follow-up for Phone Call        OK to schedule.  Follow-up by: Nani Gasser MD,  June 17, 2010 9:36 AM  Additional Follow-up for Phone Call Additional follow up Details #1::        Faxed order to [facility] therapy in [place] Additional Follow-up by: Kathlene November,  June 17, 2010 9:41 AM

## 2010-12-11 NOTE — Letter (Signed)
Summary: CMN for DME Denied / [facility]  CMN for DME Denied / [facility]   Imported By: Lennie Odor 11/25/2010 10:01:17  _____________________________________________________________________  External Attachment:    Type:   Image     Comment:   External Document

## 2010-12-11 NOTE — Assessment & Plan Note (Signed)
Summary: RECTAL BLEEDING...AS.    History of Present Illness Visit Type: Initial Visit Primary GI MD: Lina Sar MD Primary Provider: Nani Gasser, MD Chief Complaint: Rectal bleeding x 3 months, has subsided to a degree History of Present Illness:   This is a 55 year old white female with intermittent rectal bleeding, at times associated with rectal pain but most of the time painless. This has been going on for several months. She has a family history of a malignant dysplastic polyp in her father and colon cancer in a paternal grandmother. A colonoscopy in May 2003 and again in March 2008 was normal. She denies being constipated. She reports occasional rectal pain with bowel movements. 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   GI Review of Systems    Reports acid reflux.      Denies abdominal pain, belching, bloating, chest pain, dysphagia with liquids, dysphagia with solids, heartburn, loss of appetite, nausea, vomiting, vomiting blood, weight loss, and  weight gain.      Reports rectal bleeding.     Denies anal fissure, black tarry stools, change in bowel habit, constipation, diarrhea, diverticulosis, fecal incontinence, heme positive stool, hemorrhoids, irritable bowel syndrome, jaundice, light color stool, liver problems, and  rectal pain. Preventive Screening-Counseling & Management      Drug Use:  no.      Current Medications (verified): 1)  Labetalol Hcl 100 Mg Tabs (Labetalol Hcl) .... Take 1 Tablet By Mouth Two Times A Day 2)  Synthroid 137 Mcg Tabs (Levothyroxine Sodium) .... Take 1 Tablet By Mouth Once A Day 3)  Benicar Hct 40-25 Mg  Tabs (Olmesartan Medoxomil-Hctz) .... Take 1 Tablet By Mouth Once A Day 4)  Prilosec Otc 20 Mg  Tbec (Omeprazole Magnesium) .Marland Kitchen.. 1 Each Day 30 Minutes Before Meal 5)  Celebrex 200 Mg Caps (Celecoxib) .Marland Kitchen.. 1 By Mouth Daily 6)  Fish Oil 1000 Mg  Caps (Omega-3 Fatty Acids) .... Take 1 Capsule By Mouth Three Times A Day 7)  Vitamin D3 2000 Unit Caps (Cholecalciferol) .... Take One Tablet By Mouth Once A Day 8)  Glucosamine Sulfate 750 Mg Caps (Glucosamine Sulfate) .... Take One Tablet By Mouth Twice A Dday 9)  Lipitor 40 Mg Tabs (Atorvastatin Calcium) .... Take 1 Tablet By Mouth Once A Day At Bedtime  Allergies (verified): No Known Drug Allergies  Past History:  Past Medical History: Reviewed history from 10/14/2010 and no changes required. Gyn - Dr.Lavoie Current Problems:  Family Hx of COLON CANCER (ICD-153.9) SLEEP APNEA, OBSTRUCTIVE (ICD-327.23) SHOULDER PAIN, LEFT (ICD-719.41) PARESTHESIA, HANDS (ICD-782.0) KNEE PAIN, RIGHT, ACUTE (ICD-719.46) IMPAIRED FASTING GLUCOSE (ICD-790.21) GASTROENTERITIS (ICD-558.9) ANEMIA NOS (ICD-285.9) HYPOTHYROIDISM (ICD-244.9) HYPERLIPIDEMIA NEC/NOS (ICD-272.4) HYPERTENSION, BENIGN ESSENTIAL (ICD-401.1)    Past Surgical History: Reviewed history from 03/19/2009 and no changes required. Upper palate of mouth repaired after a large tear 1961 Torn ligament of right shoulder repaired 1975 Right knee surgery, arthroscopy for torn medial meniscus  2009  Family History: Reviewed history from 10/14/2010 and no changes required. 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  No children.   Never Smoked Alcohol use-yes Drug use-no Regular exercise-no Daily Caffeine Use Illicit Drug Use - no  Review of Systems       The patient complains of allergy/sinus, back pain, heart murmur,  muscle pains/cramps, night sweats, sleeping problems, swelling of feet/legs, and urine leakage.  The patient denies anemia, anxiety-new, arthritis/joint pain, blood in urine, breast changes/lumps, change in vision, confusion, cough, coughing up blood,  depression-new, fainting, fatigue, fever, headaches-new, hearing problems, heart rhythm changes, itching, menstrual pain, nosebleeds, pregnancy symptoms, shortness of breath, skin rash, sore throat, swollen lymph glands, thirst - excessive , urination - excessive , urination changes/pain, vision changes, and voice change.         Pertinent positive and negative review of systems were noted in the above HPI. All other ROS was otherwise negative.   Vital Signs:  Patient profile:   55 year old female Height:      66 inches Weight:      222.50 pounds BMI:     36.04 Pulse rate:   76 / minute Pulse rhythm:   regular BP sitting:   112 / 68  (left arm) Cuff size:   regular  Vitals Entered By: June McMurray CMA Duncan Dull) (October 20, 2010 3:10 PM)  Physical Exam  General:  alert, oriented and mildly overweight. Eyes:  nonicteric. Neck:  Supple; no masses or thyromegaly. Lungs:  Clear throughout to auscultation. Heart:  Regular rate and rhythm; no murmurs, rubs,  or bruits. Abdomen:  Soft, nontender and nondistended. No masses, hepatosplenomegaly or hernias noted. Normal bowel sounds. Rectal:  rectal and anoscopic exam reveals on normal perianal area. Somewhat decreased rectal sphincter tone with visible half inch long fissure at 3:00 which bleeds as it is dilated with the anoscope. It was nontender. They did not appear to be deep or fibrosed. There was also one internal hemorrhoid that appeared edematous but that did not show any stigmata of bleeding. Stool is Hemoccult negative. Extremities:  No clubbing, cyanosis, edema or deformities noted.   Impression & Recommendations:  Problem # 1:  Family Hx of COLON CANCER (ICD-153.9) Patient's next recall colonoscopy will be due in March 2013  Problem # 2:  ANAL FISSURE (ICD-565.0) An anal fissue was seen on today's endoscopic exam. She will be given a prescription of Analpram cream 2.5%. I have discussed treatment of anal fissure with her and have  given her literature about it. She will start Anusol-HC suppositories for the internal hemorrhoids. I have asked her to stand up frequently and walk around. I have also asked her to take a stool softener as needed to prevent straining.  Patient Instructions: 1)  Please pick up your prescriptions at the pharmacy. Electronic prescription(s) has already been sent for Anusol suppositories and Analpram cream.2.5 % bid 2)  You should use Anusol  HC suppositories, 1 suppository per rectum at bedtime. 3)  We have given you a handout on Anal Fissure which we would like for you to read over. 4)  recall colonoscopy March 2013 5)  Office visit 2 months. Consider surgical repair of fissure if symptoms persist 6)  Copy sent to : Nani Gasser, MD 7)  The medication list was reviewed and reconciled.  All changed / newly prescribed medications were explained.  A complete medication list was provided to the patient / caregiver. Prescriptions: ANUSOL-HC 25 MG SUPP (HYDROCORTISONE ACETATE) Insert 1 suppository into rectum at bedtime  #12 x 1   Entered by:   Lamona Curl CMA (AAMA)   Authorized by:   Hart Carwin MD   Signed by:   Lamona Curl CMA (AAMA) on 10/20/2010   Method used:   Electronically to        [facility]* (retail)       [address]       [address] Shipping/mailing       [place], [place]  [address]       Ph: [phone]       Fax: [phone]   RxID:   [id number] ANALPRAM-HC 1-2.5 % CREA (HYDROCORTISONE ACE-PRAMOXINE) Apply to rectum two times a day  #15 grams x 1   Entered by:   Lamona Curl CMA (AAMA)   Authorized by:   Hart Carwin MD   Signed by:   Lamona Curl CMA (AAMA) on 10/20/2010   Method used:   Electronically to        [facility]* (retail)       [address]       [address] Shipping/mailing       [place], [place]  [address]       Ph: [phone]       Fax: [phone]   RxID:    [id number]

## 2010-12-29 ENCOUNTER — Other Ambulatory Visit: Payer: Self-pay | Admitting: Obstetrics & Gynecology

## 2010-12-29 DIAGNOSIS — Z1231 Encounter for screening mammogram for malignant neoplasm of breast: Secondary | ICD-10-CM

## 2011-02-09 ENCOUNTER — Ambulatory Visit
Admission: RE | Admit: 2011-02-09 | Discharge: 2011-02-09 | Disposition: A | Discharge status: Home / Self Care | Payer: 59 | Source: Ambulatory Visit | Attending: Obstetrics & Gynecology | Admitting: Obstetrics & Gynecology

## 2011-02-09 DIAGNOSIS — Z1231 Encounter for screening mammogram for malignant neoplasm of breast: Secondary | ICD-10-CM

## 2011-03-24 NOTE — Op Note (Signed)
NAMEJAMESE, [name]                ACCOUNT NO.:  [id number]   MEDICAL RECORD NO.:  [id number]          PATIENT TYPE:  AMB   LOCATION:  DSC                          FACILITY:  [facility]   PHYSICIAN:  Robert A. Thurston Hole, M.D. DATE OF BIRTH:  1956-04-07   DATE OF PROCEDURE:  05/29/2008  DATE OF DISCHARGE:                               OPERATIVE REPORT   PREOPERATIVE DIAGNOSIS:  Right knee medial and lateral meniscal tear  with chondromalacia and synovitis.   POSTOPERATIVE DIAGNOSIS:  Right knee medial and lateral meniscal tear  with chondromalacia and synovitis.   PROCEDURE:  1. Right knee examination under anesthesia followed by arthroscopic      partial medial and lateral meniscectomies.  2. Right knee chondroplasty with partial synovectomy.   SURGEON:  Elana Alm. Thurston Hole, MD   ANESTHESIA:  Local MAC.   OPERATIVE TIME:  Thirty minutes.   COMPLICATIONS:  None.   INDICATIONS FOR PROCEDURE:  [name] is a 55 year old woman who has had  significant right knee pain for the past 3 months, increasing in nature  with examine MRI documenting meniscal tear with chondromalacia and  synovitis.  She has failed conservative care and is now to undergo  arthroscopy.   DESCRIPTION:  [name] was brought to the operating room on May 29, 2008, after a knee block was placed in the holding area by Anesthesia.  She was placed on operating table in supine position.  Her right knee  was examined under anesthesia.  She had full range of motion, 1-2+  crepitation, knee stable ligamentous exam, and normal patellar tracking.  The right leg was prepped using sterile DuraPrep and draped using  sterile technique.  Initially through an anterolateral portal, the  arthroscope with a pump attached was placed into an anteromedial portal,  and arthroscopic probe was placed.  On initial inspection of the medial  compartment, she had 30-40% grade 3 chondromalacia, which was debrided.  Medial meniscus tear 30%,  posterior and medial horn, which was resected  back to stable rim.  Intercondylar notch was inspected.  Anterior and  posterior crucial ligaments were normal.  Lateral compartment was  inspected.  Grade 1 and 2 chondromalacia noted.  Lateral meniscus  partial tear is 20% posterolateral corner, which was resected back to a  stable rim.  Patellofemoral joint showed 30-40% grade 3 chondromalacia  on the patella, which was debrided.  Femoral groove and articular  cartilage was intact.  The patella tracked normally.  Moderate synovitis  and medial and lateral gutters were debrided.  Otherwise, this was free  of pathology.  After this was done, it was felt that all pathology have  been satisfactorily addressed.  The instruments were removed.  The  portals were closed with 3-0 nylon suture and injected with 0.25%  Marcaine with epinephrine and 4 mg of morphine.  Sterile dressings were  applied, and the patient awakened and taken to the recovery room in  stable condition.   FOLLOWUP CARE:  [name] will be followed as an outpatient on Vicodin  and Mobic.  She will be seen back  in the office in a week for sutures  out and followup.      Robert A. Thurston Hole, M.D.  Electronically Signed     RAW/MEDQ  D:  05/29/2008  T:  05/30/2008  Job:  [id number]

## 2011-03-27 NOTE — Op Note (Signed)
[facility]  Patient:    Alexis Mcneil, Alexis Mcneil Visit Number: [id number] MRN: [id number]          Service Type: END Location: ENDO Attending Physician:  Mervin Hack Dictated by:   Hedwig Morton. Juanda Chance, M.D. [facility] Proc. Date: 03/17/02 Admit Date:  03/17/2002 Discharge Date: 03/17/2002   CC:         Darius Bump, M.D.   Operative Report  PROCEDURE:  Colonoscopy.  ENDOSCOPIST:  Hedwig Morton. Juanda Chance, M.D.  INDICATIONS:  This 55 year old white female has strong family history of colon cancer in her father who had high grade dysplasia and a polyp, her grandmother who had colon cancer at the age of 2.  The patient herself has diarrhea on an intermittent basis and intermittent left lower quadrant abdominal pain.  She is undergoing colonoscopy to explain the symptoms as well as for screening.  ENDOSCOPE:  Fujinon single channel videoscope.  SEDATION:  Versed 10 mg IV, fentanyl 100 mcg IV.  DESCRIPTION OF PROCEDURE:  The Fujinon single channel videoscope was passed under direct vision to the sigmoid colon.  The patient was monitored by pulse oximeter.  Her oxygen saturations were normal.  Her prep was adequate.  Anal canal and rectal ampulla was unremarkable.  Sigmoid colon showed large thickened folds but no definite diverticula.  The scope was passed with some difficulty through the sigmoid colon because of its tortuosity and enlarged folds.  Descending colon, splenic flexure and transverse colon were unremarkable.  The hepatic flexure and ascending colon was also normal and was traversed without difficulty.  The cecum, ileocecal valve and the appendiceal opening were normal.  The colonoscope was then retracted.  The colon was decompressed.  There were no polyps.  IMPRESSION:  Prediverticular changes of the left colon.  Otherwise normal exam.  PLAN: 1. The patients left lower quadrant abdominal pain is likely related to    an irritable bowel  syndrome due to prediverticular changes of the left    colon.  She is to stay on a high fiber diet and NuLev 0.125 mg sublingual    on a p.r.n. basis. 2. Repeat colonoscopy in five to seven years. Dictated by:   Hedwig Morton. Juanda Chance, M.D. [facility] Attending Physician:  Mervin Hack DD:  03/19/02 TD:  03/19/02 Job: [id number]

## 2011-06-09 ENCOUNTER — Other Ambulatory Visit: Payer: Self-pay | Admitting: Family Medicine

## 2011-06-16 ENCOUNTER — Other Ambulatory Visit: Payer: Self-pay | Admitting: *Deleted

## 2011-06-16 MED ORDER — ESOMEPRAZOLE MAGNESIUM 40 MG PO CPDR: 40.0000 mg | DELAYED_RELEASE_CAPSULE | Freq: Every day | ORAL | Status: DC

## 2011-06-16 MED ORDER — LEVOTHYROXINE SODIUM 137 MCG PO TABS
137.0000 ug | ORAL_TABLET | Freq: Every day | ORAL | Status: DC
Start: 2011-06-16 — End: 2011-07-20

## 2011-06-16 MED ORDER — LABETALOL HCL 100 MG PO TABS: 100.0000 mg | ORAL_TABLET | Freq: Two times a day (BID) | ORAL | Status: DC

## 2011-06-29 ENCOUNTER — Encounter: Payer: Self-pay | Admitting: Family Medicine

## 2011-06-30 ENCOUNTER — Encounter: Payer: Self-pay | Admitting: Family Medicine

## 2011-07-06 ENCOUNTER — Encounter: Payer: Self-pay | Admitting: Family Medicine

## 2011-07-06 ENCOUNTER — Ambulatory Visit (INDEPENDENT_AMBULATORY_CARE_PROVIDER_SITE_OTHER): Payer: 59 | Admitting: Family Medicine

## 2011-07-06 VITALS — BP 109/60 | HR 98 | Wt 225.0 lb

## 2011-07-06 DIAGNOSIS — E039 Hypothyroidism, unspecified: Secondary | ICD-10-CM

## 2011-07-06 DIAGNOSIS — F32A Depression, unspecified: Secondary | ICD-10-CM

## 2011-07-06 DIAGNOSIS — E785 Hyperlipidemia, unspecified: Secondary | ICD-10-CM

## 2011-07-06 DIAGNOSIS — F3289 Other specified depressive episodes: Secondary | ICD-10-CM

## 2011-07-06 DIAGNOSIS — D649 Anemia, unspecified: Secondary | ICD-10-CM

## 2011-07-06 DIAGNOSIS — F329 Major depressive disorder, single episode, unspecified: Secondary | ICD-10-CM

## 2011-07-06 MED ORDER — OLMESARTAN MEDOXOMIL-HCTZ 40-25 MG PO TABS: 1.0000 | ORAL_TABLET | Freq: Every day | ORAL | Status: DC

## 2011-07-06 MED ORDER — ESOMEPRAZOLE MAGNESIUM 40 MG PO CPDR: 40.0000 mg | DELAYED_RELEASE_CAPSULE | Freq: Every day | ORAL | Status: DC

## 2011-07-06 NOTE — Patient Instructions (Signed)
Stop the labetolol.

## 2011-07-06 NOTE — Progress Notes (Signed)
  Subjective:    Patient ID: Alexis Mcneil, female    DOB: [birth year], 55 y.o.   MRN: [id number]  HPI Hypercholesterolemia - She is compliant with her meds.  Says has diffuse jont pain and wonders if from her cholesterol med.She felt that was on simvastatin.  She has been more fatigued lately. Last 3 times tried to donate blood was turned down because her hgb was low. She she hasn't been very motivated to exercise and then when does she aches so much in her joints she quits.   Her partner of 15 year left her last year. Then came back and then left again. She is seeing is Veterinary surgeon. She was tearful in the office talking about it. counseling is hepling. She is not interested in meds at this time.    HTN- compliant with meds. No CP, SOB, or dizziness.  On Benicar HCT.    GERD - Still taking her nexium daily. She takes Advil daily for her OA pain. Celebrex was causing LE edema.    TSH - occ hot flashes. We haven't rechcked her level in almost a year. No weight changes. Occ feels hot but not hotflashes. No skin or hair changes.    Review of Systems     Objective:   Physical Exam  Constitutional: She is oriented to person, place, and time. She appears well-developed and well-nourished.  HENT:  Head: Normocephalic and atraumatic.  Right Ear: External ear normal.  Left Ear: External ear normal.  Cardiovascular: Normal rate, regular rhythm and normal heart sounds.   Pulmonary/Chest: Effort normal and breath sounds normal.  Musculoskeletal: She exhibits no edema.  Neurological: She is alert and oriented to person, place, and time.  Skin: Skin is warm and dry.  Psychiatric: She has a normal mood and affect. Her behavior is normal.          Assessment & Plan:  HTN - Very well controlled. rEAlly a little low so will stop the labetalol and contin the benicar hct and recheck BP in 4-6 weeks.   GERD - Discussed potential risk of osteoporosis and fractures on longterm PPI. Rec dec to every other  day and wan as tolerated.  Monitor for GI S.E. Since on NSAIDs daily.   Hypothyroid - Recheck TSH. Adjust if needed.  Hyperlipidemia - Rechck Chol. We can stop med for one month and see if feels better. Pt to let me know. If still have joint pain then restart the lipitor.    Anemia - Check B12 and iron.   Depression - Discussed counseling and possible med. She is not interested in med at this time.

## 2011-07-08 LAB — COMPLETE METABOLIC PANEL WITHOUT GFR
ALT: 22 U/L (ref 0–35)
AST: 15 U/L (ref 0–37)
Albumin: 4.5 g/dL (ref 3.5–5.2)
Alkaline Phosphatase: 81 U/L (ref 39–117)
BUN: 22 mg/dL (ref 6–23)
CO2: 23 meq/L (ref 19–32)
Calcium: 9.1 mg/dL (ref 8.4–10.5)
Chloride: 106 meq/L (ref 96–112)
Creat: 0.96 mg/dL (ref 0.50–1.10)
GFR, Est African American: >60 mL/min
GFR, Est Non African American: >60 mL/min
Glucose, Bld: 110 mg/dL — ABNORMAL HIGH (ref 70–99)
Potassium: 4.2 meq/L (ref 3.5–5.3)
Sodium: 142 meq/L (ref 135–145)
Total Bilirubin: 0.5 mg/dL (ref 0.3–1.2)
Total Protein: 6.4 g/dL (ref 6.0–8.3)

## 2011-07-08 LAB — VITAMIN B12: Vitamin B-12: 430 pg/mL (ref 211–911)

## 2011-07-08 LAB — IBC PANEL
%SAT: 23 % (ref 20–55)
TIBC: 305 ug/dL (ref 250–470)
UIBC: 234 ug/dL (ref 125–400)

## 2011-07-08 LAB — TSH: TSH: 2.838 u[IU]/mL (ref 0.350–4.500)

## 2011-07-08 LAB — LIPID PANEL: Cholesterol: 165 mg/dL (ref 0–200)

## 2011-07-09 ENCOUNTER — Telehealth: Payer: Self-pay | Admitting: Family Medicine

## 2011-07-09 NOTE — Telephone Encounter (Signed)
Call pt: B12 and iron and thyroid are normal.  Glucose is still mild elevated. Good chol is low and TG are very high. Is she stil taking her fish oil? If so then I recommend adding Tricor.  Let me know if pt ok.

## 2011-07-10 NOTE — Telephone Encounter (Signed)
Left message onvm with results and to call back if ok with tricor

## 2011-07-20 ENCOUNTER — Telehealth: Payer: Self-pay | Admitting: Family Medicine

## 2011-07-20 ENCOUNTER — Other Ambulatory Visit: Payer: Self-pay | Admitting: *Deleted

## 2011-07-20 MED ORDER — LEVOTHYROXINE SODIUM 137 MCG PO TABS: 137.0000 ug | ORAL_TABLET | Freq: Every day | ORAL | Status: DC

## 2011-07-20 MED ORDER — ATORVASTATIN CALCIUM 40 MG PO TABS: 40.0000 mg | ORAL_TABLET | Freq: Every day | ORAL | Status: DC

## 2011-07-20 NOTE — Telephone Encounter (Signed)
Pt had called on 07-14-11 and requested copy of recent labs. Plan:  Mailed copy of labs to the pt. Jarvis Newcomer, LPN [facility]

## 2011-08-07 LAB — BASIC METABOLIC PANEL
CO2: 28
Calcium: 9.1
Creatinine, Ser: 0.84
GFR calc Af Amer: >60

## 2011-08-07 LAB — POCT HEMOGLOBIN-HEMACUE: Hemoglobin: 12.2

## 2011-08-21 ENCOUNTER — Other Ambulatory Visit: Payer: Self-pay | Admitting: Family Medicine

## 2011-08-21 MED ORDER — LEVOTHYROXINE SODIUM 137 MCG PO TABS: 137.0000 ug | ORAL_TABLET | Freq: Every day | ORAL | Status: DC

## 2011-08-21 NOTE — Telephone Encounter (Signed)
Pt called and needs refill of her thyroid medication. Pt insurance is changing as far as having to use catalyst and generic brands, so benicar will not be covered.  Will need to choose diovan HCT, losartan HCTZ, micardis HCT.  These options are on pt ins plan.   Plan:  Refill sent #30/5 levothyroxine  to [facility].  Pt aware. Jarvis Newcomer, LPN [facility]

## 2011-08-22 NOTE — Telephone Encounter (Signed)
Darl Pikes I would change her to Micardis/HCT 80/25 will instructions ton return to the office in a month to make BP was doing all right.

## 2011-08-26 NOTE — Telephone Encounter (Signed)
Acknowledged. Joselyn Edling, LPN /Triage  

## 2011-09-01 ENCOUNTER — Telehealth: Payer: Self-pay | Admitting: Family Medicine

## 2011-09-02 ENCOUNTER — Telehealth: Payer: Self-pay | Admitting: *Deleted

## 2011-09-02 MED ORDER — LOSARTAN POTASSIUM 100 MG PO TABS: 100.0000 mg | ORAL_TABLET | Freq: Every day | ORAL | Status: DC

## 2011-09-02 MED ORDER — HYDROCHLOROTHIAZIDE 25 MG PO TABS: 25.0000 mg | ORAL_TABLET | Freq: Every day | ORAL | Status: DC

## 2011-09-02 NOTE — Telephone Encounter (Signed)
Pt called and states the pharmacist gave her cozaar and HCTZ in place of her Benicar because she can get those for free and wants a rx sent in to her pharm

## 2011-09-02 NOTE — Telephone Encounter (Signed)
Left message on vm

## 2011-09-02 NOTE — Telephone Encounter (Signed)
I changed her prescriptions and sent him to [facility]. She still needs to keep her followup appointment.

## 2011-09-02 NOTE — Telephone Encounter (Signed)
Closed

## 2011-10-26 ENCOUNTER — Emergency Department
Admission: EM | Admit: 2011-10-26 | Discharge: 2011-10-26 | Disposition: A | Discharge status: Home / Self Care | Payer: 59 | Source: Home / Self Care | Attending: Family Medicine | Admitting: Family Medicine

## 2011-10-26 ENCOUNTER — Encounter: Payer: Self-pay | Admitting: *Deleted

## 2011-10-26 DIAGNOSIS — J302 Other seasonal allergic rhinitis: Secondary | ICD-10-CM

## 2011-10-26 DIAGNOSIS — J309 Allergic rhinitis, unspecified: Secondary | ICD-10-CM

## 2011-10-26 DIAGNOSIS — J0100 Acute maxillary sinusitis, unspecified: Secondary | ICD-10-CM

## 2011-10-26 DIAGNOSIS — J069 Acute upper respiratory infection, unspecified: Secondary | ICD-10-CM

## 2011-10-26 MED ORDER — AMOXICILLIN 875 MG PO TABS: 875.0000 mg | ORAL_TABLET | Freq: Two times a day (BID) | ORAL | Status: AC

## 2011-10-26 MED ORDER — BENZONATATE 200 MG PO CAPS: 200.0000 mg | ORAL_CAPSULE | Freq: Every day | ORAL | Status: AC

## 2011-10-26 MED ORDER — GUAIFENESIN ER 600 MG PO TB12: 1200.0000 mg | ORAL_TABLET | Freq: Two times a day (BID) | ORAL | Status: DC

## 2011-10-26 MED ORDER — FLUTICASONE PROPIONATE 50 MCG/ACT NA SUSP: 2.0000 | Freq: Every day | NASAL | Status: DC

## 2011-10-26 NOTE — ED Notes (Signed)
Pt c/o cold and URI s/s x ago. She also c/o  lingering cough, nasal congestion and sinus pain x 1wk. She has taken nyquil and dayquil. Denies fever.

## 2011-10-26 NOTE — ED Provider Notes (Signed)
History     CSN: [id number] Arrival date & time: 10/26/2011  2:46 PM   First MD Initiated Contact with Patient 10/26/11 1511      Chief Complaint  Patient presents with  . Cough  . Facial Pain  . Nasal Congestion      HPI Comments: Patient states that she had a cold about a month ago that lasted 1.5 weeks.  Most of the symptoms resolved except for nasal congestion.  Over the past 3 days she has developed a recurrent sore throat, increased sinus congestion, and increased cough.  Her cough is now productive of brownish sputum.  Patient is a 55 y.o. female presenting with sinusitis. The history is provided by the patient.  Sinusitis  This is a new problem. The current episode started more than 2 days ago. The problem has been gradually worsening. Maximum temperature: low grade fever. The pain has been constant since onset. Associated symptoms include congestion, ear pain, sinus pressure, sore throat and cough. Pertinent negatives include no chills, no sweats, no hoarse voice, no swollen glands and no shortness of breath. Treatments tried: Dayquil and Nyquil. The treatment provided no relief.    Past Medical History  Diagnosis Date  . Cancer   . OSA (obstructive sleep apnea)   . Shoulder pain   . Paresthesia of hand   . Knee pain, right   . IFG (impaired fasting glucose)   . Gastroenteritis   . Anemia   . Thyroid disease   . Hyperlipidemia   . Hypertension     Past Surgical History  Procedure Date  . Mouth palate surgery repair 1961    large tear  . Torn ligament shoulder 1975    repaired  . Rt knee surgery arthroscopy 2009    torn meniscus/medial    Family History  Problem Relation Age of Onset  . Colon cancer Father 63    colon/ polyps  . Colon cancer Paternal Grandmother     colon  . Stroke Paternal Grandmother   . Heart disease Paternal Grandmother     heart attack  . Diabetes      grandmother  . Hyperlipidemia Mother   . Hypertension Mother     History    Substance Use Topics  . Smoking status: Never Smoker   . Smokeless tobacco: Not on file  . Alcohol Use: Yes    OB History    Grav Para Term Preterm Abortions TAB SAB Ect Mult Living                  Review of Systems  Constitutional: Positive for fever and fatigue. Negative for chills.  HENT: Positive for ear pain, congestion, sore throat, rhinorrhea, postnasal drip and sinus pressure. Negative for hoarse voice.   Eyes: Negative.   Respiratory: Positive for cough and chest tightness. Negative for shortness of breath and wheezing.   Cardiovascular: Negative.   Gastrointestinal: Negative.   Genitourinary: Negative.   Musculoskeletal: Negative.   Skin: Negative.   Neurological: Negative for headaches.    Allergies  Review of patient's allergies indicates no known allergies.  Home Medications   Current Outpatient Rx  Name Route Sig Dispense Refill  . AMOXICILLIN 875 MG PO TABS Oral Take 1 tablet (875 mg total) by mouth 2 (two) times daily. 20 tablet 0  . ATORVASTATIN CALCIUM 40 MG PO TABS Oral Take 1 tablet (40 mg total) by mouth daily. 90 tablet 1  . BENZONATATE 200 MG PO CAPS Oral Take 1  capsule (200 mg total) by mouth at bedtime. Take as needed for cough 12 capsule 0  . ESOMEPRAZOLE MAGNESIUM 40 MG PO CPDR Oral Take 1 capsule (40 mg total) by mouth daily. 90 capsule 3  . OMEGA-3 FATTY ACIDS 1000 MG PO CAPS Oral Take 1 g by mouth 3 (three) times daily.      Marland Kitchen FLUTICASONE PROPIONATE 50 MCG/ACT NA SUSP Nasal Place 2 sprays into the nose daily. 16 g 1  . GLUCOSAMINE SULFATE 750 MG PO CAPS Oral Take by mouth 2 (two) times daily.      . GUAIFENESIN ER 600 MG PO TB12 Oral Take 2 tablets (1,200 mg total) by mouth 2 (two) times daily. 20 tablet 0  . HYDROCHLOROTHIAZIDE 25 MG PO TABS Oral Take 1 tablet (25 mg total) by mouth daily. 30 tablet 6  . LEVOTHYROXINE SODIUM 137 MCG PO TABS Oral Take 1 tablet (137 mcg total) by mouth daily. 30 tablet 5    Pt needs blood work  . LOSARTAN  POTASSIUM 100 MG PO TABS Oral Take 1 tablet (100 mg total) by mouth daily. 30 tablet 6  . MULTIVITAMIN PO Oral Take by mouth.      . SYNTHROID 137 MCG PO TABS  TAKE 1 TABLET BY MOUTH DAILY. 30 tablet 0  . SYNTHROID 137 MCG PO TABS  TAKE 1 TABLET BY MOUTH DAILY. 30 tablet 0  . SYNTHROID 137 MCG PO TABS  TAKE 1 TABLET BY MOUTH DAILY. 30 tablet 0  . VITAMIN D (CHOLECALCIFEROL) PO Oral Take by mouth.        BP 142/90  Pulse 81  Temp(Src) 98.6 F (37 C) (Oral)  Resp 18  Ht 5\' 6"  (1.676 m)  Wt 222 lb 12 oz (101.039 kg)  BMI 35.95 kg/m2  SpO2 95%  Physical Exam  Nursing note and vitals reviewed. Constitutional: She is oriented to person, place, and time. She appears well-developed and well-nourished. No distress.       Patient is obese (BMI 36)   HENT:  Head: Normocephalic and atraumatic.  Right Ear: Tympanic membrane, external ear and ear canal normal.  Left Ear: Tympanic membrane, external ear and ear canal normal.  Nose: Mucosal edema and rhinorrhea present. Right sinus exhibits maxillary sinus tenderness. Left sinus exhibits maxillary sinus tenderness.  Mouth/Throat: Oropharynx is clear and moist. No oral lesions. No oropharyngeal exudate.  Eyes: Conjunctivae are normal. Pupils are equal, round, and reactive to light. Right eye exhibits no discharge. Left eye exhibits no discharge. No scleral icterus.  Neck: Neck supple.  Cardiovascular: Normal rate, regular rhythm and normal heart sounds.   Pulmonary/Chest: Effort normal and breath sounds normal. She has no wheezes. She has no rales.  Abdominal: Soft. There is no tenderness.  Musculoskeletal: She exhibits no edema and no tenderness.  Lymphadenopathy:    She has no cervical adenopathy.  Neurological: She is alert and oriented to person, place, and time.  Skin: Skin is warm and dry.      ED Course  Procedures  none      1. Acute maxillary sinusitis   2. Acute upper respiratory infections of unspecified site   3. Seasonal  allergic rhinitis       MDM  Begin amoxicillin, fluticasone nasal spray, and cough suppressant at bedtime Take Mucinex (guaifenesin) twice daily for congestion.  Increase fluid intake, rest. May use Afrin nasal spray (or generic oxymetazoline) twice daily for about 5 days.  Also recommend using saline nasal spray several times daily  and/or saline nasal irrigation. Stop all antihistamines for now, and other non-prescription cough/cold preparations.. Follow-up with family doctor if not improving 7 to 10 days.    Patient has an extensive list of currently prescribed medications.  All new medications have been reconciled with current medications using the Ep[employer] Drug Interactions program.  No significant interactions were revealed.       Donna Christen, MD 10/28/11 2325

## 2012-01-06 ENCOUNTER — Encounter: Payer: Self-pay | Admitting: Internal Medicine

## 2012-01-26 ENCOUNTER — Other Ambulatory Visit: Payer: Self-pay | Admitting: Obstetrics & Gynecology

## 2012-01-26 DIAGNOSIS — Z1231 Encounter for screening mammogram for malignant neoplasm of breast: Secondary | ICD-10-CM

## 2012-02-11 ENCOUNTER — Ambulatory Visit: Payer: 59

## 2012-02-12 ENCOUNTER — Ambulatory Visit
Admission: RE | Admit: 2012-02-12 | Discharge: 2012-02-12 | Disposition: A | Discharge status: Home / Self Care | Payer: 59 | Source: Ambulatory Visit | Attending: Obstetrics & Gynecology | Admitting: Obstetrics & Gynecology

## 2012-02-12 DIAGNOSIS — Z1231 Encounter for screening mammogram for malignant neoplasm of breast: Secondary | ICD-10-CM

## 2012-02-29 ENCOUNTER — Other Ambulatory Visit: Payer: Self-pay | Admitting: Family Medicine

## 2012-03-29 ENCOUNTER — Ambulatory Visit (INDEPENDENT_AMBULATORY_CARE_PROVIDER_SITE_OTHER): Payer: 59 | Admitting: Family Medicine

## 2012-03-29 ENCOUNTER — Encounter: Payer: Self-pay | Admitting: Family Medicine

## 2012-03-29 VITALS — BP 131/81 | HR 79 | Ht 66.0 in | Wt 228.0 lb

## 2012-03-29 DIAGNOSIS — E785 Hyperlipidemia, unspecified: Secondary | ICD-10-CM

## 2012-03-29 DIAGNOSIS — R7301 Impaired fasting glucose: Secondary | ICD-10-CM

## 2012-03-29 DIAGNOSIS — R7309 Other abnormal glucose: Secondary | ICD-10-CM

## 2012-03-29 DIAGNOSIS — I10 Essential (primary) hypertension: Secondary | ICD-10-CM

## 2012-03-29 DIAGNOSIS — E039 Hypothyroidism, unspecified: Secondary | ICD-10-CM

## 2012-03-29 MED ORDER — LOSARTAN POTASSIUM 100 MG PO TABS: 100.0000 mg | ORAL_TABLET | Freq: Every day | ORAL | Status: DC

## 2012-03-29 MED ORDER — PITAVASTATIN CALCIUM 4 MG PO TABS: 1.0000 | ORAL_TABLET | Freq: Every day | ORAL | Status: DC

## 2012-03-29 MED ORDER — METOPROLOL SUCCINATE ER 25 MG PO TB24: 25.0000 mg | ORAL_TABLET | Freq: Every day | ORAL | Status: DC

## 2012-03-29 MED ORDER — HYDROCHLOROTHIAZIDE 25 MG PO TABS: 25.0000 mg | ORAL_TABLET | Freq: Every day | ORAL | Status: DC

## 2012-03-29 NOTE — Progress Notes (Signed)
  Subjective:    Patient ID: Alexis Mcneil, female    DOB: 09-08-1956, 56 y.o.   MRN: [id number]  HPI HTN - doing well. Taking meds regularly. BP at work was 138/92.    hyperlpidemia - Says just really feels achey on th emedication.  Says went misses med for a couple of days.  Has been taking cherry tart for the aches and pain   Review of Systems     Objective:   Physical Exam  Constitutional: She is oriented to person, place, and time. She appears well-developed and well-nourished.  HENT:  Head: Normocephalic and atraumatic.  Cardiovascular: Normal rate, regular rhythm and normal heart sounds.   Pulmonary/Chest: Effort normal and breath sounds normal.  Neurological: She is alert and oriented to person, place, and time.  Skin: Skin is warm and dry.  Psychiatric: She has a normal mood and affect. Her behavior is normal.          Assessment & Plan:  HTN - Will add back a betablocker. Was taking labetalol before, but we had stopped it bc bp was so well controlled.  Will change to metoprolol XL 25mg  . Cointinue losartan and HCTZ. They are free when written seperately.    Hyperlipidemia - Disussed options. Trial of livalo recommended. Given 2 weeks of samples of 4mg  and coupon card for $18 a month.  I would like her to try it for 6 weeks to see if feels better off the lipitor.  Then go to recheck lipids in 2-3 weeks.  Need to make sure TG are under control. Can also increase fish oild to 4 tabs.    Hypothyroid - Says still feels tired.  On brand synthroid.  Recheck to make sure at goal. If normal then consider change to compounded thyroid med.  Also work on exercise and diet. Maybe if get her aches better form lipitor better then can exercise more and feel better.  Lab Results  Component Value Date   TSH 2.838 07/06/2011   IFG- Due to recheck A1C. Last glucose at work was 102.

## 2012-05-03 ENCOUNTER — Other Ambulatory Visit: Payer: Self-pay | Admitting: Family Medicine

## 2012-05-05 LAB — COMPLETE METABOLIC PANEL WITH GFR
Albumin: 4.1 g/dL (ref 3.5–5.2)
BUN: 19 mg/dL (ref 6–23)
CO2: 24 mEq/L (ref 19–32)
Calcium: 9.4 mg/dL (ref 8.4–10.5)
Chloride: 105 mEq/L (ref 96–112)
Creat: 0.83 mg/dL (ref 0.50–1.10)
GFR, Est African American: >89 mL/min
Glucose, Bld: 99 mg/dL (ref 70–99)
Potassium: 3.7 mEq/L (ref 3.5–5.3)

## 2012-05-05 LAB — HEMOGLOBIN A1C
Hgb A1c MFr Bld: 6.1 % — ABNORMAL HIGH (ref <5.7)
Mean Plasma Glucose: 128 mg/dL — ABNORMAL HIGH (ref <117)

## 2012-05-05 LAB — LIPID PANEL
Cholesterol: 135 mg/dL (ref 0–200)
Triglycerides: 162 mg/dL — ABNORMAL HIGH (ref <150)

## 2012-05-17 ENCOUNTER — Ambulatory Visit (INDEPENDENT_AMBULATORY_CARE_PROVIDER_SITE_OTHER): Payer: 59 | Admitting: Family Medicine

## 2012-05-17 ENCOUNTER — Encounter: Payer: Self-pay | Admitting: Family Medicine

## 2012-05-17 VITALS — BP 134/76 | HR 66 | Ht 66.0 in | Wt 230.0 lb

## 2012-05-17 DIAGNOSIS — I10 Essential (primary) hypertension: Secondary | ICD-10-CM

## 2012-05-17 DIAGNOSIS — G5600 Carpal tunnel syndrome, unspecified upper limb: Secondary | ICD-10-CM

## 2012-05-17 DIAGNOSIS — E785 Hyperlipidemia, unspecified: Secondary | ICD-10-CM

## 2012-05-17 NOTE — Patient Instructions (Signed)
Carpal Tunnel Syndrome Carpal tunnel syndrome is a disorder of the nervous system in the wrist that causes pain, hand weakness, and/or loss of feeling. Carpal tunnel syndrome is caused by the compression, stretching, or irritation of the median nerve at the wrist joint. Athletes who experience carpal tunnel syndrome may notice a decrease in their performance to the condition, especially for sports that require strong hand or wrist action.   SYMPTOMS    Tingling, numbness, or burning pain in the hand or fingers.   Inability to sleep due to pain in the hand.   Sharp pains that shoot from the wrist up the arm or to the fingers, especially at night.   Morning stiffness or cramping of the hand.   Thumb weakness, resulting in difficulty holding objects or making a fist.   Shiny, dry skin on the hand.   Reduced performance in any sport requiring a strong grip.  CAUSES    Median nerve damage at the wrist is caused by pressure due to swelling, inflammation, or scarred tissue.   Sources of pressure include:   Repetitive gripping or squeezing that causes inflammation of the tendon sheaths.   Scarring or shortening of the ligament that covers the median nerve.   Traumatic injury to the wrist or forearm such as fracture, sprain, or dislocation.   Prolonged hyperextension (wrist bent backward) or hyperflexion (wrist bent downward) of the wrist.  RISK INCREASES WITH:  Diabetes mellitus.   Menopause or amenorrhea.   Rheumatoid arthritis.   Raynaud's disease.   Pregnancy.   Gout.   Kidney disease.   Ganglion cyst.   Repetitive hand or wrist action.   Hypothyroidism (underactive thyroid gland).   Repetitive jolting or shaking of the hands or wrist.   Prolonged forceful weight-bearing on the hands.  PREVENTION  Bracing the hand and wrist straight during activities that involve repetitive grasping.   For activities that require prolonged extension of the wrist (bending towards  the top of the forearm) periodically change the position of your wrists.   Learn and use proper technique in activities that result in the wrist position in neutral to slight extension.   Avoid bending the wrist into full extension or flexion (up or down)   Keep the wrist in a straight (neutral) position. To keep the wrist in this position, wear a splint.   Avoid repetitive hand and wrist motions.   When possible avoid prolonged grasping of items (steering wheel of a car, a pen, a vacuum cleaner, or a rake).   Loosen your grip for activities that require prolonged grasping of items.   Place keyboards and writing surfaces at the correct height as to decrease strain on the wrist and hand.   Alternate work tasks to avoid prolonged wrist flexion.   Avoid pinching activities (needlework and writing) as they may irritate your carpal tunnel syndrome.   If these activities are necessary, complete them for shorter periods of time.   When writing, use a felt tip or roller ball pen and/or build up the grip on a pen to decrease the forces required for writing.  PROGNOSIS   Carpal tunnel syndrome is usually curable with appropriate conservative treatment and sometimes resolves spontaneously. For some cases, surgery is necessary, especially if muscle wasting or nerve changes have developed.   RELATED COMPLICATIONS    Permanent numbness and a weak thumb or fingers in the affected hand.   Permanent paralysis of a portion of the hand and fingers.  TREATMENT  Treatment initially consists of stopping activities that aggravate the symptoms as well as medication and ice to reduce inflammation. A wrist splint is often recommended for wear during activities of repetitive motion as well as at night. It is also important to learn and use proper technique when performing activities that typically cause pain. On occasion, a corticosteroid injection may be given. If symptoms persist despite conservative  treatment, surgery may be an option. Surgical techniques free the pinched or compressed nerve. Carpal tunnel surgery is usually performed on an outpatient basis, meaning you go home the same day as surgery. These procedures provide almost complete relief of all symptoms in 95% of patients. Expect at least 2 weeks for healing after surgery. For cases that are the result of repeated jolting or shaking of the hand or wrist or prolonged hyperextension, surgery is not usually recommended, because stretching of the median nerve and not compression are usually the cause of carpal tunnel syndrome in these cases. MEDICATION    If pain medication is necessary, nonsteroidal anti-inflammatory medications, such as aspirin and ibuprofen, or other minor pain relievers, such as acetaminophen, are often recommended.   Do not take pain medication for 7 days before surgery.   Prescription pain relievers are usually only prescribed after surgery. Use only as directed and only as much as you need.   Corticosteroid injections may be given to reduce inflammation. However, they are not always recommended.   Vitamin B6 (pyridoxine) may reduce symptoms; use only if prescribed for your disorder.  SEEK MEDICAL CARE IF:    Symptoms get worse or do not improve in 2 weeks despite treatment.   You also have a current or recent history of neck or shoulder injury that has resulted in pain or tingling elsewhere in your arm.  Document Released: 10/26/2005 Document Revised: 10/15/2011 Document Reviewed: 02/07/2009 [facility] Patient Information 2012 [place], [place].

## 2012-05-17 NOTE — Progress Notes (Signed)
  Subjective:    Patient ID: Alexis Mcneil, female    DOB: Apr 20, 1956, 56 y.o.   MRN: [id number]  HPI Hyperlipidemia - Doing well on the livalo. Within 3 days of getting off the lipitor felt tremendously better.   Numbness in hands started a couple of weeks ago. Says if sleeps on her sides her whole hands go numb.  Hx of C3-4 disc issues.  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  Getts numbness in thumb and first finger.  Occ discomfort in the wrist.   HTN- No CP or SOB. Doing well on BP regimen.    Review of Systems     Objective:   Physical Exam  Constitutional: Alexis Mcneil is oriented to person, place, and time. Alexis Mcneil appears well-developed and well-nourished.  HENT:  Head: Normocephalic and atraumatic.  Cardiovascular: Normal rate, regular rhythm and normal heart sounds.   Pulmonary/Chest: Effort normal and breath sounds normal.  Musculoskeletal:       Wrist with normal range of motion. No swelling. Nontender. Negative Tinel's sign. Positive Phalen's sign.  Neurological: Alexis Mcneil is alert and oriented to person, place, and time.  Skin: Skin is warm and dry.  Psychiatric: Alexis Mcneil has a normal mood and affect. Her behavior is normal.          Assessment & Plan:  Hyperlipidemia- well-controlled on the Livalo. Doing well. Her myalgias are much better. Then Alexis Mcneil can start an exercise program and work on losing weight. Alexis Mcneil did increase fish oil to 4 tablets as well and it seems to be working well.  Carpal tunnel syndrome - New dx reveiwed. H.O given. Bilat wrist splints given at night. Can start exercise in 5-7 days. Recommend IBU 600mg  tid with food and water. Avoid NSAIDs if any GI irritation. If not improving in the next 3 weeks then consider referral for injection and maybe PT.    HTN - Well controlled.  F/U in 6 months.

## 2012-05-18 ENCOUNTER — Telehealth: Payer: Self-pay | Admitting: *Deleted

## 2012-05-18 NOTE — Telephone Encounter (Signed)
Can you please take the charge out for this pt for the wrist splints? The patient decided not to get them. Thank you

## 2012-05-19 NOTE — Telephone Encounter (Signed)
DME charge removed

## 2012-05-31 ENCOUNTER — Other Ambulatory Visit: Payer: Self-pay | Admitting: Family Medicine

## 2012-06-06 ENCOUNTER — Other Ambulatory Visit: Payer: Self-pay | Admitting: Family Medicine

## 2012-07-22 ENCOUNTER — Other Ambulatory Visit: Payer: Self-pay | Admitting: Family Medicine

## 2012-08-10 ENCOUNTER — Other Ambulatory Visit: Payer: Self-pay | Admitting: Family Medicine

## 2012-09-14 ENCOUNTER — Other Ambulatory Visit (HOSPITAL_COMMUNITY)
Admission: RE | Admit: 2012-09-14 | Discharge: 2012-09-14 | Disposition: A | Discharge status: Home / Self Care | Payer: 59 | Source: Ambulatory Visit | Attending: Family Medicine | Admitting: Family Medicine

## 2012-09-14 ENCOUNTER — Other Ambulatory Visit: Payer: Self-pay | Admitting: Obstetrics & Gynecology

## 2012-09-14 DIAGNOSIS — Z1151 Encounter for screening for human papillomavirus (HPV): Secondary | ICD-10-CM | POA: Insufficient documentation

## 2012-09-14 DIAGNOSIS — Z01419 Encounter for gynecological examination (general) (routine) without abnormal findings: Secondary | ICD-10-CM | POA: Insufficient documentation

## 2012-09-22 ENCOUNTER — Encounter: Payer: Self-pay | Admitting: Family Medicine

## 2012-10-03 ENCOUNTER — Other Ambulatory Visit: Payer: Self-pay | Admitting: Family Medicine

## 2012-10-12 ENCOUNTER — Other Ambulatory Visit: Payer: Self-pay | Admitting: Family Medicine

## 2012-10-31 ENCOUNTER — Other Ambulatory Visit: Payer: Self-pay | Admitting: Obstetrics & Gynecology

## 2012-10-31 ENCOUNTER — Encounter (HOSPITAL_COMMUNITY): Payer: Self-pay

## 2012-11-07 ENCOUNTER — Other Ambulatory Visit: Payer: Self-pay | Admitting: Family Medicine

## 2012-11-07 ENCOUNTER — Encounter: Payer: Self-pay | Admitting: Family Medicine

## 2012-11-08 ENCOUNTER — Other Ambulatory Visit: Payer: Self-pay

## 2012-11-08 ENCOUNTER — Encounter (HOSPITAL_COMMUNITY): Payer: Self-pay

## 2012-11-08 ENCOUNTER — Encounter (HOSPITAL_COMMUNITY)
Admission: RE | Admit: 2012-11-08 | Discharge: 2012-11-08 | Disposition: A | Discharge status: Home / Self Care | Payer: 59 | Source: Ambulatory Visit | Attending: Obstetrics & Gynecology | Admitting: Obstetrics & Gynecology

## 2012-11-08 LAB — COMPREHENSIVE METABOLIC PANEL
ALT: 35 U/L (ref 0–35)
Albumin: 3.8 g/dL (ref 3.5–5.2)
Alkaline Phosphatase: 68 U/L (ref 39–117)
BUN: 18 mg/dL (ref 6–23)
Calcium: 9.8 mg/dL (ref 8.4–10.5)
GFR calc Af Amer: 87 mL/min — ABNORMAL LOW (ref >90)
Glucose, Bld: 124 mg/dL — ABNORMAL HIGH (ref 70–99)
Potassium: 3.8 mEq/L (ref 3.5–5.1)
Sodium: 136 mEq/L (ref 135–145)
Total Protein: 6.8 g/dL (ref 6.0–8.3)

## 2012-11-08 LAB — CBC
Hemoglobin: 12.5 g/dL (ref 12.0–15.0)
MCH: 29.5 pg (ref 26.0–34.0)
MCHC: 34 g/dL (ref 30.0–36.0)
RDW: 14.1 % (ref 11.5–15.5)

## 2012-11-08 NOTE — Patient Instructions (Addendum)
20 Ayliana Casciano  11/08/2012   Your procedure is scheduled on:  11/10/12  Enter through the Main Entrance of [facility] at 800 AM.  Pick up the phone at the desk and dial [phone].   Call this number if you have problems the morning of surgery: [phone]   Remember:   Do not eat food:After Midnight.  Do not drink clear liquids: After Midnight.  Take these medicines the morning of surgery with A SIP OF WATER: Blood pressure medication, Nexium, Thyroid Medication   Do not wear jewelry, make-up or nail polish.  Do not wear lotions, powders, or perfumes. You may wear deodorant.  Do not shave 48 hours prior to surgery.  Do not bring valuables to the hospital.  Contacts, dentures or bridgework may not be worn into surgery.  Leave suitcase in the car. After surgery it may be brought to your room.  For patients admitted to the hospital, checkout time is 11:00 AM the day of discharge.   Patients discharged the day of surgery will not be allowed to drive home.  Name and phone number of your driver: Lorra Hals  Special Instructions: Shower using CHG 2 nights before surgery and the night before surgery.  If you shower the day of surgery use CHG.  Use special wash - you have one bottle of CHG for all showers.  You should use approximately 1/3 of the bottle for each shower.   Please read over the following fact sheets that you were given: Surgical Site Infection Prevention

## 2012-11-10 ENCOUNTER — Encounter (HOSPITAL_COMMUNITY): Payer: Self-pay | Admitting: Anesthesiology

## 2012-11-10 ENCOUNTER — Encounter (HOSPITAL_COMMUNITY)
Admission: RE | Disposition: A | Discharge status: Home / Self Care | Payer: Self-pay | Source: Ambulatory Visit | Attending: Obstetrics & Gynecology

## 2012-11-10 ENCOUNTER — Ambulatory Visit (HOSPITAL_COMMUNITY)
Admission: RE | Admit: 2012-11-10 | Discharge: 2012-11-10 | Disposition: A | Discharge status: Home / Self Care | Payer: 59 | Source: Ambulatory Visit | Attending: Obstetrics & Gynecology | Admitting: Obstetrics & Gynecology

## 2012-11-10 ENCOUNTER — Ambulatory Visit (HOSPITAL_COMMUNITY): Payer: 59 | Admitting: Anesthesiology

## 2012-11-10 ENCOUNTER — Encounter (HOSPITAL_COMMUNITY): Payer: Self-pay | Admitting: *Deleted

## 2012-11-10 DIAGNOSIS — Z01812 Encounter for preprocedural laboratory examination: Secondary | ICD-10-CM | POA: Insufficient documentation

## 2012-11-10 DIAGNOSIS — N950 Postmenopausal bleeding: Secondary | ICD-10-CM | POA: Insufficient documentation

## 2012-11-10 DIAGNOSIS — N840 Polyp of corpus uteri: Secondary | ICD-10-CM | POA: Insufficient documentation

## 2012-11-10 DIAGNOSIS — Z01818 Encounter for other preprocedural examination: Secondary | ICD-10-CM | POA: Insufficient documentation

## 2012-11-10 LAB — PREGNANCY, URINE: Preg Test, Ur: NEGATIVE

## 2012-11-10 SURGERY — DILATATION & CURETTAGE/HYSTEROSCOPY WITH TRUCLEAR
Anesthesia: General | Site: Uterus | Wound class: Clean Contaminated

## 2012-11-10 MED ORDER — KETOROLAC TROMETHAMINE 30 MG/ML IJ SOLN
INTRAMUSCULAR | Status: AC
  Filled 2012-11-10: qty 1

## 2012-11-10 MED ORDER — OXYCODONE-ACETAMINOPHEN 7.5-325 MG PO TABS: 1.0000 | ORAL_TABLET | ORAL | Status: DC | PRN

## 2012-11-10 MED ORDER — DEXAMETHASONE SODIUM PHOSPHATE 10 MG/ML IJ SOLN
INTRAMUSCULAR | Status: DC | PRN
  Administered 2012-11-10: 10 mg via INTRAVENOUS

## 2012-11-10 MED ORDER — FENTANYL CITRATE 0.05 MG/ML IJ SOLN
INTRAMUSCULAR | Status: AC
  Filled 2012-11-10: qty 2

## 2012-11-10 MED ORDER — KETOROLAC TROMETHAMINE 30 MG/ML IJ SOLN
INTRAMUSCULAR | Status: DC | PRN
  Administered 2012-11-10: 30 mg via INTRAVENOUS

## 2012-11-10 MED ORDER — CEFAZOLIN SODIUM-DEXTROSE 2-3 GM-% IV SOLR
2.0000 g | INTRAVENOUS | Status: AC
  Administered 2012-11-10: 2 g via INTRAVENOUS

## 2012-11-10 MED ORDER — PROPOFOL 10 MG/ML IV EMUL
INTRAVENOUS | Status: AC
  Filled 2012-11-10: qty 20

## 2012-11-10 MED ORDER — SODIUM CHLORIDE 0.9 % IR SOLN
Status: DC | PRN
  Administered 2012-11-10: 3000 mL

## 2012-11-10 MED ORDER — CHLOROPROCAINE HCL 1 % IJ SOLN
INTRAMUSCULAR | Status: DC | PRN
  Administered 2012-11-10: 10 mL

## 2012-11-10 MED ORDER — LIDOCAINE HCL (CARDIAC) 20 MG/ML IV SOLN
INTRAVENOUS | Status: AC
  Filled 2012-11-10: qty 5

## 2012-11-10 MED ORDER — PANTOPRAZOLE SODIUM 40 MG IV SOLR
40.0000 mg | Freq: Once | INTRAVENOUS | Status: DC
  Filled 2012-11-10: qty 40

## 2012-11-10 MED ORDER — LACTATED RINGERS IV SOLN
INTRAVENOUS | Status: DC
  Administered 2012-11-10: 10:00:00 via INTRAVENOUS
  Administered 2012-11-10: 125 mL/h via INTRAVENOUS

## 2012-11-10 MED ORDER — CEFAZOLIN SODIUM-DEXTROSE 2-3 GM-% IV SOLR
INTRAVENOUS | Status: AC
  Filled 2012-11-10: qty 50

## 2012-11-10 MED ORDER — LIDOCAINE HCL (CARDIAC) 20 MG/ML IV SOLN
INTRAVENOUS | Status: DC | PRN
  Administered 2012-11-10: 40 mg via INTRAVENOUS

## 2012-11-10 MED ORDER — PROPOFOL 10 MG/ML IV BOLUS
INTRAVENOUS | Status: DC | PRN
  Administered 2012-11-10: 150 mg via INTRAVENOUS

## 2012-11-10 MED ORDER — ONDANSETRON HCL 4 MG/2ML IJ SOLN
INTRAMUSCULAR | Status: DC | PRN
  Administered 2012-11-10: 4 mg via INTRAVENOUS

## 2012-11-10 MED ORDER — MIDAZOLAM HCL 5 MG/5ML IJ SOLN
INTRAMUSCULAR | Status: DC | PRN
  Administered 2012-11-10: 2 mg via INTRAVENOUS

## 2012-11-10 MED ORDER — ONDANSETRON HCL 4 MG/2ML IJ SOLN
INTRAMUSCULAR | Status: AC
  Filled 2012-11-10: qty 2

## 2012-11-10 MED ORDER — DEXAMETHASONE SODIUM PHOSPHATE 10 MG/ML IJ SOLN
INTRAMUSCULAR | Status: AC
  Filled 2012-11-10: qty 1

## 2012-11-10 MED ORDER — MIDAZOLAM HCL 2 MG/2ML IJ SOLN
INTRAMUSCULAR | Status: AC
  Filled 2012-11-10: qty 2

## 2012-11-10 MED ORDER — CHLOROPROCAINE HCL 1 % IJ SOLN
INTRAMUSCULAR | Status: AC
  Filled 2012-11-10: qty 30

## 2012-11-10 MED ORDER — FENTANYL CITRATE 0.05 MG/ML IJ SOLN
INTRAMUSCULAR | Status: DC | PRN
  Administered 2012-11-10: 100 ug via INTRAVENOUS

## 2012-11-10 MED ORDER — PANTOPRAZOLE SODIUM 40 MG PO TBEC
DELAYED_RELEASE_TABLET | ORAL | Status: AC
  Administered 2012-11-10: 40 mg
  Filled 2012-11-10: qty 1

## 2012-11-10 SURGICAL SUPPLY — 21 items
BLADE INCISOR TRUC PLUS 2.9 (ABLATOR) IMPLANT
CANISTERS HI-FLOW 3000CC (CANNISTER) ×4 IMPLANT
CATH ROBINSON RED A/P 16FR (CATHETERS) ×2 IMPLANT
CLOTH BEACON ORANGE TIMEOUT ST (SAFETY) ×2 IMPLANT
CONTAINER PREFILL 10% NBF 60ML (FORM) ×4 IMPLANT
DRAPE HYSTEROSCOPY (DRAPE) ×2 IMPLANT
DRESSING TELFA 8X3 (GAUZE/BANDAGES/DRESSINGS) ×2 IMPLANT
GLOVE BIO SURGEON STRL SZ 6.5 (GLOVE) ×4 IMPLANT
GLOVE BIOGEL PI IND STRL 7.0 (GLOVE) ×1 IMPLANT
GLOVE BIOGEL PI INDICATOR 7.0 (GLOVE) ×1
GOWN STRL REIN XL XLG (GOWN DISPOSABLE) ×4 IMPLANT
INCISOR TRUC PLUS BLADE 2.9 (ABLATOR) ×2
KIT HYSTEROSCOPY TRUCLEAR (ABLATOR) ×1 IMPLANT
MORCELLATOR RECIP TRUCLEAR 4.0 (ABLATOR) IMPLANT
NDL SPNL 22GX3.5 QUINCKE BK (NEEDLE) ×1 IMPLANT
NEEDLE SPNL 22GX3.5 QUINCKE BK (NEEDLE) ×2 IMPLANT
PACK VAGINAL MINOR WOMEN LF (CUSTOM PROCEDURE TRAY) ×2 IMPLANT
PAD OB MATERNITY 4.3X12.25 (PERSONAL CARE ITEMS) ×2 IMPLANT
SYR CONTROL 10ML LL (SYRINGE) ×2 IMPLANT
TOWEL OR 17X24 6PK STRL BLUE (TOWEL DISPOSABLE) ×4 IMPLANT
WATER STERILE IRR 1000ML POUR (IV SOLUTION) ×2 IMPLANT

## 2012-11-10 NOTE — Discharge Summary (Signed)
  Physician Discharge Summary  Patient ID: Alexis Mcneil MRN: [id number] DOB/AGE: 1956-02-01 57 y.o.  Admit date: 11/10/2012 Discharge date: 11/10/2012  Admission Diagnoses: Postmenopausal Bleeding, intrauterine lesions  Discharge Diagnoses: Postmenopausal Bleeding, intrauterine lesions        Active Problems:  * No active hospital problems. *    Discharged Condition: good  Hospital Course: Outpatient  Consults: None  Treatments: surgery: Hysteroscopy, resection TruClear, D+C  Disposition: 01-Home or Self Care     Medication List     As of 11/10/2012 10:21 AM    TAKE these medications         fish oil-omega-3 fatty acids 1000 MG capsule   Take 2 g by mouth 2 (two) times daily.      hydrochlorothiazide 25 MG tablet   Commonly known as: HYDRODIURIL   Take 1 tablet (25 mg total) by mouth daily.      levothyroxine 137 MCG tablet   Commonly known as: SYNTHROID, LEVOTHROID   Take 137 mcg by mouth daily before breakfast.      LIVALO 4 MG Tabs   Generic drug: Pitavastatin Calcium   TAKE 1 TABLET BY MOUTH EVERY NIGHT AT BEDTIME      losartan 100 MG tablet   Commonly known as: COZAAR   Take 1 tablet (100 mg total) by mouth daily.      meloxicam 15 MG tablet   Commonly known as: MOBIC   Take 15 mg by mouth daily.      metoprolol succinate 25 MG 24 hr tablet   Commonly known as: TOPROL-XL   TAKE 1 TABLET BY MOUTH ONCE DAILY      NEXIUM 40 MG capsule   Generic drug: esomeprazole   TAKE 1 CAPSULE BY MOUTH ONCE DAILY      oxyCODONE-acetaminophen 7.5-325 MG per tablet   Commonly known as: PERCOCET   Take 1 tablet by mouth every 4 (four) hours as needed for pain.      VITAMIN D (CHOLECALCIFEROL) PO   Take 2,000 Int'l Units by mouth at bedtime.           Follow-up Information    Follow up with Lyvia Mondesir,MARIE-LYNE, MD. In 3 weeks.   Contact information:   [address] [place] [address] [phone]          Signed: Genia Del, MD 11/10/2012,  10:21 AM

## 2012-11-10 NOTE — Transfer of Care (Signed)
Immediate Anesthesia Transfer of Care Note  Patient: Alexis Mcneil  Procedure(s) Performed: Procedure(s) (LRB) with comments: DILATATION & CURETTAGE/HYSTEROSCOPY WITH TRUCLEAR (N/A)  Patient Location: PACU  Anesthesia Type:General  Level of Consciousness: awake  Airway & Oxygen Therapy: Patient Spontanous Breathing and Patient connected to nasal cannula oxygen  Post-op Assessment: Report given to PACU RN and Post -op Vital signs reviewed and stable  Post vital signs: stable  Complications: No apparent anesthesia complications

## 2012-11-10 NOTE — Anesthesia Preprocedure Evaluation (Addendum)
Anesthesia Evaluation  Patient identified by MRN, date of birth, ID band Patient awake    Reviewed: Allergy & Precautions, H&P , Patient's Chart, lab work & pertinent test results, reviewed documented beta blocker date and time   Airway Mallampati: II TM Distance: >3 FB Neck ROM: full    Dental No notable dental hx.    Pulmonary sleep apnea ,  breath sounds clear to auscultation  Pulmonary exam normal       Cardiovascular hypertension, Pt. on medications and Pt. on home beta blockers Rhythm:regular Rate:Normal     Neuro/Psych    GI/Hepatic   Endo/Other  Morbid obesity  Renal/GU      Musculoskeletal   Abdominal   Peds  Hematology   Anesthesia Other Findings   Reproductive/Obstetrics                           Anesthesia Physical Anesthesia Plan  ASA: II  Anesthesia Plan: General   Post-op Pain Management:    Induction: Intravenous  Airway Management Planned: LMA  Additional Equipment:   Intra-op Plan:   Post-operative Plan:   Informed Consent: I have reviewed the patients History and Physical, chart, labs and discussed the procedure including the risks, benefits and alternatives for the proposed anesthesia with the patient or authorized representative who has indicated his/her understanding and acceptance.   Dental Advisory Given  Plan Discussed with: CRNA and Surgeon  Anesthesia Plan Comments: (  Discussed  general anesthesia, including possible nausea, instrumentation of airway, sore throat,pulmonary aspiration, etc. I asked if the were any outstanding questions, or  concerns before we proceeded. )        Anesthesia Quick Evaluation

## 2012-11-10 NOTE — H&P (Signed)
Alexis Mcneil is an 57 y.o. female  G0  RP:  IU lesion for [facility], resection, D+C  Pertinent Gynecological History: Menses: post-menopausal Contraception: abstinence, same sex partner Blood transfusions: none Sexually transmitted diseases: no past history Previous GYN Procedures: none  Last mammogram: normal Last pap: normal  OB History: G0   Menstrual History:  No LMP recorded. Patient is postmenopausal.    Past Medical History  Diagnosis Date  . OSA (obstructive sleep apnea)   . IFG (impaired fasting glucose)   . Anemia   . Thyroid disease   . Hyperlipidemia   . Hypertension     Past Surgical History  Procedure Date  . Mouth palate surgery repair 1961    large tear  . Torn ligament shoulder 1975    repaired  . Rt knee surgery arthroscopy 2009    torn meniscus/medial    Family History  Problem Relation Age of Onset  . Colon cancer Father 87    colon/ polyps  . Colon cancer Paternal Grandmother   . Stroke Paternal Grandmother   . Heart attack Paternal Grandmother     heart attack  . Diabetes      grandmother  . Hyperlipidemia Mother   . Hypertension Mother     Social History:  reports that she has never smoked. She has never used smokeless tobacco. She reports that she drinks alcohol. She reports that she does not use illicit drugs.  Allergies:  Allergies  Allergen Reactions  . Lipitor (Atorvastatin) Other (See Comments)    Myalgias, stiffness.   . Niacin And Related Nausea And Vomiting    Passed out and threw up    Prescriptions prior to admission  Medication Sig Dispense Refill  . fish oil-omega-3 fatty acids 1000 MG capsule Take 2 g by mouth 2 (two) times daily.       . hydrochlorothiazide (HYDRODIURIL) 25 MG tablet Take 1 tablet (25 mg total) by mouth daily.  90 tablet  3  . levothyroxine (SYNTHROID, LEVOTHROID) 137 MCG tablet Take 137 mcg by mouth daily before breakfast.      . LIVALO 4 MG TABS TAKE 1 TABLET BY MOUTH EVERY NIGHT AT BEDTIME  30  tablet  1  . losartan (COZAAR) 100 MG tablet Take 1 tablet (100 mg total) by mouth daily.  90 tablet  3  . meloxicam (MOBIC) 15 MG tablet Take 15 mg by mouth daily.      . metoprolol succinate (TOPROL-XL) 25 MG 24 hr tablet TAKE 1 TABLET BY MOUTH ONCE DAILY  30 tablet  1  . NEXIUM 40 MG capsule TAKE 1 CAPSULE BY MOUTH ONCE DAILY  90 capsule  0  . VITAMIN D, CHOLECALCIFEROL, PO Take 2,000 Int'l Units by mouth at bedtime.          Blood pressure 142/88, pulse 66, temperature 97.5 F (36.4 C), temperature source Oral, resp. rate 18, SpO2 97.00%.   Results for orders placed during the hospital encounter of 11/10/12 (from the past 24 hour(s))  PREGNANCY, URINE     Status: Normal   Collection Time   11/10/12  8:23 AM      Component Value Range   Preg Test, Ur NEGATIVE  NEGATIVE    No results found.  Assessment/Plan: IU lesion for [facility], resection, D+C  Juwana Thoreson,MARIE-LYNE 11/10/2012, 9:31 AM

## 2012-11-10 NOTE — Op Note (Signed)
11/10/2012  10:11 AM  PATIENT:  Alexis Mcneil  57 y.o. female  PRE-OPERATIVE DIAGNOSIS:  Postmenopausal Bleeding, intrauterine lesions  POST-OPERATIVE DIAGNOSIS:  Postmenopausal Bleeding, intrauterine lesions  PROCEDURE:  Procedure(s): DILATATION & CURETTAGE/HYSTEROSCOPY WITH TRUCLEAR  SURGEON:  Surgeon(s): Genia Del, MD  ASSISTANTS: none   ANESTHESIA:   general  PROCEDURE:  Under general anesthesia the patient is in lithotomy position. She is prepped with Betadine on the suprapubic, vulvar and vaginal areas. The vaginal exam revealed an anteverted uterus, no adnexal mass. The anterior lip of the cervix is grasped with a tenaculum. A paracervical block was done with Nesacaine a total of 20 cc at 4 and 8:00. Dilation of the cervix with Hegar dilators up to #21. The hysterometry was at 8 cm. We used a small hysteroscope with the Truclear device. Pictures were taken of the intrauterine cavity. 2 polyps were seen coming from the posterior wall, midline and to the right. There were each about 1 cm in diameter.  Those 2 lesions were resected without difficulty. Pictures were taken after resection. Hemostasis was adequate. Both ostia were well seen. The cavity appeared normal. The hysteroscope was removed. A curettage of the intrauterine cavity was done with a sharp curet. The cavity was curetted on all surfaces. The 2 specimens were sent to pathology together. The tenaculum was removed. Hemostasis was adequate. The speculum was removed as well. The patient was brought to recovery room in good and stable status.  Fluid deficit 100 cc ESTIMATED BLOOD LOSS: 10 cc   Intake/Output Summary (Last 24 hours) at 11/10/12 1011 Last data filed at 11/10/12 0953  Gross per 24 hour  Intake   1000 ml  Output    100 ml  Net    900 ml     BLOOD ADMINISTERED:none   LOCAL MEDICATIONS USED:  NESACAINE   SPECIMEN:  Source of Specimen:  Endometrial polyps and curettings  DISPOSITION OF SPECIMEN:   PATHOLOGY  COUNTS:  YES  PLAN OF CARE: Transfer to PACU   Genia Del MD   11/10/2012 at 10:13 am

## 2012-11-11 ENCOUNTER — Other Ambulatory Visit: Payer: Self-pay | Admitting: Family Medicine

## 2012-11-11 ENCOUNTER — Telehealth: Payer: Self-pay

## 2012-11-11 DIAGNOSIS — I10 Essential (primary) hypertension: Secondary | ICD-10-CM

## 2012-11-11 DIAGNOSIS — R7301 Impaired fasting glucose: Secondary | ICD-10-CM

## 2012-11-11 DIAGNOSIS — E039 Hypothyroidism, unspecified: Secondary | ICD-10-CM

## 2012-11-11 DIAGNOSIS — E785 Hyperlipidemia, unspecified: Secondary | ICD-10-CM

## 2012-11-11 NOTE — Anesthesia Postprocedure Evaluation (Signed)
  Anesthesia Post-op Note  Patient: Alexis Mcneil  Procedure(s) Performed: Procedure(s) (LRB) with comments: DILATATION & CURETTAGE/HYSTEROSCOPY WITH TRUCLEAR (N/A) Patient is awake and responsive. Pain and nausea are reasonably well controlled. Vital signs are stable and clinically acceptable. Oxygen saturation is clinically acceptable. There are no apparent anesthetic complications at this time. Patient is ready for discharge.

## 2012-11-11 NOTE — Telephone Encounter (Signed)
She has been scheduled for an office visit this month to follow up thyroid, hyperlipid and htn. She wants to have her labs done at [facility] before her appointment.What labs do you want her to have?   Fax to [facility] is [phone]

## 2012-11-11 NOTE — Telephone Encounter (Signed)
Tsh, lipids, CMP and A1C. Thank you.

## 2012-11-11 NOTE — Telephone Encounter (Signed)
Lab orders entered and faxed to [facility]

## 2012-11-18 LAB — COMPLETE METABOLIC PANEL WITH GFR
ALT: 27 U/L (ref 0–35)
CO2: 28 mEq/L (ref 19–32)
Creat: 0.84 mg/dL (ref 0.50–1.10)
GFR, Est African American: >89 mL/min
Total Bilirubin: 0.6 mg/dL (ref 0.3–1.2)

## 2012-11-18 LAB — LIPID PANEL
HDL: 40 mg/dL (ref >39)
LDL Cholesterol: 81 mg/dL (ref 0–99)
Triglycerides: 221 mg/dL — ABNORMAL HIGH (ref <150)
VLDL: 44 mg/dL — ABNORMAL HIGH (ref 0–40)

## 2012-11-18 LAB — HEMOGLOBIN A1C
Hgb A1c MFr Bld: 6.3 % — ABNORMAL HIGH (ref <5.7)
Mean Plasma Glucose: 134 mg/dL — ABNORMAL HIGH (ref <117)

## 2012-11-22 ENCOUNTER — Ambulatory Visit (INDEPENDENT_AMBULATORY_CARE_PROVIDER_SITE_OTHER): Payer: 59 | Admitting: Family Medicine

## 2012-11-22 ENCOUNTER — Ambulatory Visit (INDEPENDENT_AMBULATORY_CARE_PROVIDER_SITE_OTHER): Payer: 59

## 2012-11-22 ENCOUNTER — Encounter: Payer: Self-pay | Admitting: Family Medicine

## 2012-11-22 VITALS — BP 119/79 | HR 88 | Resp 16 | Wt 238.0 lb

## 2012-11-22 DIAGNOSIS — E039 Hypothyroidism, unspecified: Secondary | ICD-10-CM

## 2012-11-22 DIAGNOSIS — G5793 Unspecified mononeuropathy of bilateral lower limbs: Secondary | ICD-10-CM

## 2012-11-22 DIAGNOSIS — M545 Low back pain, unspecified: Secondary | ICD-10-CM

## 2012-11-22 DIAGNOSIS — M47817 Spondylosis without myelopathy or radiculopathy, lumbosacral region: Secondary | ICD-10-CM

## 2012-11-22 DIAGNOSIS — G5790 Unspecified mononeuropathy of unspecified lower limb: Secondary | ICD-10-CM

## 2012-11-22 DIAGNOSIS — M5137 Other intervertebral disc degeneration, lumbosacral region: Secondary | ICD-10-CM

## 2012-11-22 DIAGNOSIS — E785 Hyperlipidemia, unspecified: Secondary | ICD-10-CM

## 2012-11-22 DIAGNOSIS — G4733 Obstructive sleep apnea (adult) (pediatric): Secondary | ICD-10-CM

## 2012-11-22 MED ORDER — OMEPRAZOLE 40 MG PO CPDR: 40.0000 mg | DELAYED_RELEASE_CAPSULE | Freq: Every day | ORAL | Status: DC

## 2012-11-22 MED ORDER — LEVOTHYROXINE SODIUM 137 MCG PO CAPS: 137.0000 ug | ORAL_CAPSULE | Freq: Every day | ORAL | Status: DC

## 2012-11-22 NOTE — Progress Notes (Signed)
Subjective:    Patient ID: Alexis Mcneil, female    DOB: 11/29/1955, 57 y.o.   MRN: [id number]  HPI Hypothyroid - Would like to change to the generic thyroid medication for cost saving.  Feels thyroid is well controlled.  Says med makes her want to eat more but not sure why. Says still struggling with her weight.    OSA  - Says has been using her maching lately but needs new supplies,  the computed she's currently using is not covered under her insurance plan. She would like to try advanced home care.  Now getting numbness in several toes on both feet. Start in her 5th digit on her left foot.  Marland Kitchen  Has lots of low back tightness.  Says she knows needs to lose weight.  Says her back is so tight and stiff. She feels like she needs PT as well. Hx of surgery on her right knee. Most of her pain is on the latearl side of her knee and ortho told her it was coming from her back.  Numbness in toes is more prominent when barefeet.    GERD- request a generic instead of nexium.   Hyperlipidemia-she had a lot of muscle cramping on Lipitor so we switched her to Livalo. She says so far she's tolerating it well. She's not had recurrence of her cramping, and says overall feels much better on this medicine.  Review of Systems     Objective:   Physical Exam  Constitutional: She is oriented to person, place, and time. She appears well-developed and well-nourished.  HENT:  Head: Normocephalic and atraumatic.  Neck: Neck supple. No thyromegaly present.  Cardiovascular: Normal rate, regular rhythm and normal heart sounds.   Pulmonary/Chest: Effort normal and breath sounds normal.  Musculoskeletal:       Low back with normal flexion and extension. Decreased rotation right and left but symmetric. She is nontender over the lumbar spine itself is tender over both SI joints. More tender on the left compared to the right. Negative straight leg is bilaterally. Hip, knee, ankle strength is 5 out of 5 bilaterally.    Lymphadenopathy:    She has no cervical adenopathy.  Neurological: She is alert and oriented to person, place, and time.  Skin: Skin is warm and dry.  Psychiatric: She has a normal mood and affect. Her behavior is normal.          Assessment & Plan:  OSA - up as an order for CPAP and supplies 3 advanced home care. We'll fax him a copy of her 6 study from 09/02/2010 that showed moderately severe short of sleep apnea and hypopnea syndrome. She had an AHI of 45.7 per hour with non-positional difference. She also had moderately loud snoring. And oxygen dropped to 67% on room air. She was titrated to 7 cm water pressure. She was fitted with a ResMed Mirage Quattro face mask with heated humidifier.  Toe numbness -  Unclear etiology at this point I suspect it may be coming from her low back. Excision because she's had a chronic problem for quite some time. I would like to start with a low back x-ray film. We'll call with results. She also be a great candidate for physical therapy for low back pain.  Low back pain-her pain is chronic but she has had a lot of stiffness and discomfort has been getting worse. Will schedule for physical therapy. We'll start with x-rays. She's been taking an anti-inflammatory chronically. It does provide some  relief she does notice a difference when she takes it versus when she doesn't. The she thinks some of it may be causing some ankle swelling. When she came off temporarily for her recent surgery her ankle swelling actually improved. She had similar symptoms with Celebrex in the past. She is very tender over both SI joints but especially the left. She might be a good candidate for SI joint injections as well.  Hypothyroid - will change to generic form. Check thyroid level in 4 weeks.   Hyperlipidemia - doing well on Livalo. Continue current regimen.

## 2012-11-23 LAB — CBC
HCT: 36.7 % (ref 36.0–46.0)
Hemoglobin: 12.9 g/dL (ref 12.0–15.0)
MCH: 29.7 pg (ref 26.0–34.0)
MCV: 84.6 fL (ref 78.0–100.0)
Platelets: 199 10*3/uL (ref 150–400)
RBC: 4.34 MIL/uL (ref 3.87–5.11)

## 2012-11-23 LAB — VITAMIN B12: Vitamin B-12: 402 pg/mL (ref 211–911)

## 2012-11-25 ENCOUNTER — Encounter: Payer: Self-pay | Admitting: Family Medicine

## 2012-12-05 ENCOUNTER — Ambulatory Visit: Payer: 59 | Attending: Family Medicine | Admitting: Physical Therapy

## 2012-12-05 DIAGNOSIS — M545 Low back pain, unspecified: Secondary | ICD-10-CM | POA: Insufficient documentation

## 2012-12-05 DIAGNOSIS — M2560 Stiffness of unspecified joint, not elsewhere classified: Secondary | ICD-10-CM | POA: Insufficient documentation

## 2012-12-05 DIAGNOSIS — M6281 Muscle weakness (generalized): Secondary | ICD-10-CM | POA: Insufficient documentation

## 2012-12-05 DIAGNOSIS — IMO0001 Reserved for inherently not codable concepts without codable children: Secondary | ICD-10-CM | POA: Insufficient documentation

## 2012-12-05 DIAGNOSIS — M25559 Pain in unspecified hip: Secondary | ICD-10-CM | POA: Insufficient documentation

## 2012-12-07 ENCOUNTER — Ambulatory Visit: Payer: 59 | Admitting: Physical Therapy

## 2012-12-12 ENCOUNTER — Ambulatory Visit: Payer: 59 | Attending: Family Medicine | Admitting: Physical Therapy

## 2012-12-12 DIAGNOSIS — M25559 Pain in unspecified hip: Secondary | ICD-10-CM | POA: Insufficient documentation

## 2012-12-12 DIAGNOSIS — M6281 Muscle weakness (generalized): Secondary | ICD-10-CM | POA: Insufficient documentation

## 2012-12-12 DIAGNOSIS — M2560 Stiffness of unspecified joint, not elsewhere classified: Secondary | ICD-10-CM | POA: Insufficient documentation

## 2012-12-12 DIAGNOSIS — M545 Low back pain, unspecified: Secondary | ICD-10-CM | POA: Insufficient documentation

## 2012-12-12 DIAGNOSIS — IMO0001 Reserved for inherently not codable concepts without codable children: Secondary | ICD-10-CM | POA: Insufficient documentation

## 2012-12-14 ENCOUNTER — Ambulatory Visit: Payer: 59 | Admitting: Physical Therapy

## 2012-12-19 ENCOUNTER — Ambulatory Visit: Payer: 59 | Admitting: Physical Therapy

## 2012-12-20 ENCOUNTER — Other Ambulatory Visit: Payer: Self-pay | Admitting: Family Medicine

## 2012-12-21 ENCOUNTER — Ambulatory Visit: Payer: 59 | Admitting: Physical Therapy

## 2012-12-24 ENCOUNTER — Other Ambulatory Visit: Payer: Self-pay

## 2012-12-26 ENCOUNTER — Ambulatory Visit: Payer: 59 | Admitting: Physical Therapy

## 2012-12-28 ENCOUNTER — Ambulatory Visit: Payer: 59 | Admitting: Physical Therapy

## 2013-02-02 ENCOUNTER — Other Ambulatory Visit: Payer: Self-pay | Admitting: Obstetrics & Gynecology

## 2013-02-06 ENCOUNTER — Other Ambulatory Visit: Payer: Self-pay

## 2013-02-06 ENCOUNTER — Other Ambulatory Visit: Payer: Self-pay | Admitting: Obstetrics & Gynecology

## 2013-02-06 DIAGNOSIS — N951 Menopausal and female climacteric states: Secondary | ICD-10-CM

## 2013-02-06 DIAGNOSIS — Z1231 Encounter for screening mammogram for malignant neoplasm of breast: Secondary | ICD-10-CM

## 2013-02-07 ENCOUNTER — Other Ambulatory Visit: Payer: Self-pay | Admitting: Family Medicine

## 2013-03-01 ENCOUNTER — Ambulatory Visit
Admission: RE | Admit: 2013-03-01 | Discharge: 2013-03-01 | Disposition: A | Discharge status: Home / Self Care | Payer: 59 | Source: Ambulatory Visit | Attending: Obstetrics & Gynecology | Admitting: Obstetrics & Gynecology

## 2013-03-01 ENCOUNTER — Ambulatory Visit
Admission: RE | Admit: 2013-03-01 | Discharge: 2013-03-01 | Disposition: A | Discharge status: Home / Self Care | Payer: 59 | Source: Ambulatory Visit

## 2013-03-01 DIAGNOSIS — N951 Menopausal and female climacteric states: Secondary | ICD-10-CM

## 2013-03-01 DIAGNOSIS — Z1231 Encounter for screening mammogram for malignant neoplasm of breast: Secondary | ICD-10-CM

## 2013-03-07 ENCOUNTER — Ambulatory Visit (INDEPENDENT_AMBULATORY_CARE_PROVIDER_SITE_OTHER): Payer: 59 | Admitting: Family Medicine

## 2013-03-07 ENCOUNTER — Encounter: Payer: Self-pay | Admitting: Family Medicine

## 2013-03-07 VITALS — BP 134/70 | HR 79 | Ht 66.0 in | Wt 238.0 lb

## 2013-03-07 DIAGNOSIS — E039 Hypothyroidism, unspecified: Secondary | ICD-10-CM

## 2013-03-07 DIAGNOSIS — G4733 Obstructive sleep apnea (adult) (pediatric): Secondary | ICD-10-CM

## 2013-03-07 DIAGNOSIS — M545 Low back pain, unspecified: Secondary | ICD-10-CM

## 2013-03-07 MED ORDER — MELOXICAM 15 MG PO TABS: 15.0000 mg | ORAL_TABLET | Freq: Every day | ORAL | Status: DC

## 2013-03-07 MED ORDER — HYDROCHLOROTHIAZIDE 25 MG PO TABS: 25.0000 mg | ORAL_TABLET | Freq: Every day | ORAL | Status: DC

## 2013-03-07 NOTE — Progress Notes (Signed)
  Subjective:    Patient ID: Alexis Mcneil, female    DOB: [birth year], 57 y.o.   MRN: [id number]  HPI Hypothyroid - No skin or hair changes. No weight changes.  Taking her medication regularly without any side effects.  OSA- Getting supplies through [facility].  Has been using Tyelnol PM at beditme. Out of mobic for her knee.  She plans on seeing orthopedist back for her knee as it's starting to swell again. She knows she needs to work on weight loss. We recently increased her CPCP to 8 CWP and she is not sure if has made a difference.   Low Back Pain - PT did help.  Did get a new mattress and has helped. Overall she significant improvement but not complete resolution of her pain. She would like a refill for the [facility] which she uses for her knee and for her back.  Review of Systems     Objective:   Physical Exam  Constitutional: She is oriented to person, place, and time. She appears well-developed and well-nourished.  HENT:  Head: Normocephalic and atraumatic.  Neck: Neck supple. No thyromegaly present.  Cardiovascular: Normal rate, regular rhythm and normal heart sounds.   Pulmonary/Chest: Effort normal and breath sounds normal.  Neurological: She is alert and oriented to person, place, and time.  Skin: Skin is warm and dry.  Psychiatric: She has a normal mood and affect. Her behavior is normal.          Assessment & Plan:  Hypothyroid - doing well on current regimen. Due to recheck TSH. Lab slip given.  OSA - overall she has been more consistent with wearing her CPAP which is fantastic. Reminded her of the importance of treating sleep apnea to reduce her risk of future heart disease, help control blood pressure, helping control her weight as well.  Will awit next download on 8 CWP and if not feeling better consider setting machine on autotitrate.    Obesity-she really wants to work on losing weight and has been struggling.  I strongly encouraged her to get her sleep apnea  under better control  IFG-Due to recheck in July.  Lab Results  Component Value Date   HGBA1C 6.3* 11/11/2012

## 2013-03-08 ENCOUNTER — Encounter: Payer: Self-pay | Admitting: Family Medicine

## 2013-03-08 LAB — TSH: TSH: 2.74 u[IU]/mL (ref 0.350–4.500)

## 2013-03-08 NOTE — Progress Notes (Signed)
Quick Note:  All labs are normal. ______ 

## 2013-04-04 ENCOUNTER — Other Ambulatory Visit: Payer: Self-pay | Admitting: Family Medicine

## 2013-04-09 ENCOUNTER — Encounter: Payer: Self-pay | Admitting: *Deleted

## 2013-04-09 ENCOUNTER — Emergency Department
Admission: EM | Admit: 2013-04-09 | Discharge: 2013-04-09 | Disposition: A | Discharge status: Home / Self Care | Payer: 59 | Source: Home / Self Care | Attending: Family Medicine | Admitting: Family Medicine

## 2013-04-09 DIAGNOSIS — B309 Viral conjunctivitis, unspecified: Secondary | ICD-10-CM

## 2013-04-09 DIAGNOSIS — J069 Acute upper respiratory infection, unspecified: Secondary | ICD-10-CM

## 2013-04-09 MED ORDER — POLYMYXIN B-TRIMETHOPRIM 10000-0.1 UNIT/ML-% OP SOLN: OPHTHALMIC | Status: DC

## 2013-04-09 NOTE — ED Provider Notes (Signed)
History     CSN: [id number]  Arrival date & time 04/09/13  1118   First MD Initiated Contact with Patient 04/09/13 1216      Chief Complaint  Patient presents with  . Eye Drainage      HPI Comments: Patient developed URI symptoms several days ago with sinus congestion, scratchy throat, non-productive cough, and low grade fever.  Two days ago she developed redness and drainage in her right eye, and yesterday noted similar symptoms in her left eye.  No photophobia.  The history is provided by the patient.    Past Medical History  Diagnosis Date  . OSA (obstructive sleep apnea)   . IFG (impaired fasting glucose)   . Anemia   . Thyroid disease   . Hyperlipidemia   . Hypertension     Past Surgical History  Procedure Laterality Date  . Mouth palate surgery repair  1961    large tear  . Torn ligament shoulder  1975    repaired  . Rt knee surgery arthroscopy  2009    torn meniscus/medial    Family History  Problem Relation Age of Onset  . Colon cancer Father 8    colon/ polyps  . Colon cancer Paternal Grandmother   . Stroke Paternal Grandmother   . Heart attack Paternal Grandmother     heart attack  . Diabetes      grandmother  . Hyperlipidemia Mother   . Hypertension Mother     History  Substance Use Topics  . Smoking status: Never Smoker   . Smokeless tobacco: Never Used  . Alcohol Use: Yes    OB History   Grav Para Term Preterm Abortions TAB SAB Ect Mult Living                  Review of Systems + sore throat + cough No pleuritic pain No wheezing + nasal congestion + post-nasal drainage No sinus pain/pressure + itchy/red eyes No earache No hemoptysis No SOB + low grade fever, + chills No nausea No vomiting No abdominal pain No diarrhea No urinary symptoms No skin rashes + fatigue No myalgias No headache    Allergies  Lipitor and Niacin and related  Home Medications   Current Outpatient Rx  Name  Route  Sig  Dispense  Refill  .  fish oil-omega-3 fatty acids 1000 MG capsule   Oral   Take 2 g by mouth 2 (two) times daily.          . hydrochlorothiazide (HYDRODIURIL) 25 MG tablet   Oral   Take 1 tablet (25 mg total) by mouth daily.   90 tablet   3   . levothyroxine (SYNTHROID, LEVOTHROID) 137 MCG tablet      TAKE 1 TABLET BY MOUTH ONCE DAILY   30 tablet   1   . LIVALO 4 MG TABS      TAKE 1 TABLET BY MOUTH EVERY NIGHT AT BEDTIME   30 tablet   3   . EXPIRED: losartan (COZAAR) 100 MG tablet   Oral   Take 1 tablet (100 mg total) by mouth daily.   90 tablet   3   . meloxicam (MOBIC) 15 MG tablet   Oral   Take 1 tablet (15 mg total) by mouth daily.   30 tablet   3   . meloxicam (MOBIC) 15 MG tablet      TAKE 1 TABLET BY MOUTH ONCE DAILY   30 tablet   0   .  metoprolol succinate (TOPROL-XL) 25 MG 24 hr tablet      TAKE 1 TABLET BY MOUTH ONCE DAILY   30 tablet   3   . omeprazole (PRILOSEC) 40 MG capsule   Oral   Take 1 capsule (40 mg total) by mouth daily.   90 capsule   1   . trimethoprim-polymyxin b (POLYTRIM) ophthalmic solution      Place one drop into affected eye every four hours (Rx void after 04/16/13)   10 mL   0   . VITAMIN D, CHOLECALCIFEROL, PO   Oral   Take 2,000 Int'l Units by mouth at bedtime.            BP 141/78  Pulse 64  Temp(Src) 97.9 F (36.6 C) (Oral)  Resp 16  Ht 5\' 6"  (1.676 m)  Wt 240 lb (108.863 kg)  BMI 38.76 kg/m2  SpO2 94%  Physical Exam Nursing notes and Vital Signs reviewed. Appearance:  Patient appears stated age, and in no acute distress.  Patient is obese (BMI 38.8) Eyes:  Pupils are equal, round, and reactive to light and accomodation.  Extraocular movement is intact.  Conjunctivae are minimally injected.  No discharge.  No lid swelling or tenderness.  No photophobia. Ears:  Canals normal.  Tympanic membranes normal.  Nose:  Mildly congested turbinates.  No sinus tenderness.    Pharynx:  Normal Neck:  Supple.  Slightly tender shotty  posterior nodes are palpated bilaterally  Lungs:  Clear to auscultation.  Breath sounds are equal.  Heart:  Regular rate and rhythm without murmurs, rubs, or gallops.  Abdomen:  Nontender without masses or hepatosplenomegaly.  Bowel sounds are present.  No CVA or flank tenderness.  Extremities:  No edema.  No calf tenderness Skin:  No rash present.   ED Course  Procedures  none      1. Viral conjunctivitis   2. Acute upper respiratory infections of unspecified site; suspect viral URI       MDM  There is no evidence of bacterial infection today.   Treat symptomatically for now  Take plain Mucinex (guaifenesin) twice daily for cough and congestion.  Increase fluid intake, rest. May use Afrin nasal spray (or generic oxymetazoline) twice daily for about 5 days.  Also recommend using saline nasal spray several times daily and saline nasal irrigation (AYR is a common brand) Stop all antihistamines for now, and other non-prescription cough/cold preparations. May use refrigerated lubricating eye drops as needed (Begin Polytrim eye drops if eye redness worsens (Given a prescription to hold, with an expiration date) ) May take Delsym Cough Suppressant at bedtime for nighttime cough.  Follow-up with family doctor if not improving 7 to 10 days.         Lattie Haw, MD 04/12/13 1245

## 2013-04-09 NOTE — ED Notes (Signed)
Patient c/o eye drainage and crust x 2 days. Patient states she tried OTC Eyewash with no relief.

## 2013-04-11 ENCOUNTER — Other Ambulatory Visit: Payer: Self-pay | Admitting: Family Medicine

## 2013-04-21 ENCOUNTER — Other Ambulatory Visit: Payer: Self-pay | Admitting: Family Medicine

## 2013-04-26 ENCOUNTER — Other Ambulatory Visit: Payer: Self-pay | Admitting: *Deleted

## 2013-04-26 MED ORDER — PITAVASTATIN CALCIUM 4 MG PO TABS: ORAL_TABLET | ORAL | Status: DC

## 2013-05-02 ENCOUNTER — Other Ambulatory Visit: Payer: Self-pay | Admitting: Family Medicine

## 2013-05-16 ENCOUNTER — Other Ambulatory Visit: Payer: Self-pay | Admitting: Family Medicine

## 2013-05-31 ENCOUNTER — Other Ambulatory Visit: Payer: Self-pay | Admitting: Family Medicine

## 2013-06-21 ENCOUNTER — Other Ambulatory Visit: Payer: Self-pay | Admitting: Family Medicine

## 2013-07-05 ENCOUNTER — Other Ambulatory Visit: Payer: Self-pay | Admitting: Family Medicine

## 2013-08-03 ENCOUNTER — Other Ambulatory Visit: Payer: Self-pay | Admitting: Family Medicine

## 2013-08-16 ENCOUNTER — Other Ambulatory Visit: Payer: Self-pay | Admitting: Family Medicine

## 2013-08-28 ENCOUNTER — Other Ambulatory Visit: Payer: Self-pay | Admitting: Family Medicine

## 2013-09-14 ENCOUNTER — Other Ambulatory Visit: Payer: Self-pay

## 2013-10-02 ENCOUNTER — Other Ambulatory Visit: Payer: Self-pay | Admitting: Family Medicine

## 2013-10-26 ENCOUNTER — Other Ambulatory Visit: Payer: Self-pay | Admitting: Family Medicine

## 2013-11-03 ENCOUNTER — Other Ambulatory Visit: Payer: Self-pay | Admitting: Family Medicine

## 2013-11-16 ENCOUNTER — Encounter: Payer: Self-pay | Admitting: Family Medicine

## 2013-11-16 ENCOUNTER — Ambulatory Visit (INDEPENDENT_AMBULATORY_CARE_PROVIDER_SITE_OTHER): Payer: 59 | Admitting: Family Medicine

## 2013-11-16 VITALS — BP 147/84 | HR 86 | Temp 98.3°F | Ht 65.6 in | Wt 247.0 lb

## 2013-11-16 DIAGNOSIS — R200 Anesthesia of skin: Secondary | ICD-10-CM

## 2013-11-16 DIAGNOSIS — R209 Unspecified disturbances of skin sensation: Secondary | ICD-10-CM

## 2013-11-16 DIAGNOSIS — Z9989 Dependence on other enabling machines and devices: Secondary | ICD-10-CM

## 2013-11-16 DIAGNOSIS — E119 Type 2 diabetes mellitus without complications: Secondary | ICD-10-CM

## 2013-11-16 DIAGNOSIS — G4733 Obstructive sleep apnea (adult) (pediatric): Secondary | ICD-10-CM

## 2013-11-16 DIAGNOSIS — E039 Hypothyroidism, unspecified: Secondary | ICD-10-CM

## 2013-11-16 DIAGNOSIS — I10 Essential (primary) hypertension: Secondary | ICD-10-CM

## 2013-11-16 DIAGNOSIS — M545 Low back pain, unspecified: Secondary | ICD-10-CM

## 2013-11-16 DIAGNOSIS — R7301 Impaired fasting glucose: Secondary | ICD-10-CM

## 2013-11-16 LAB — POCT GLYCOSYLATED HEMOGLOBIN (HGB A1C): HEMOGLOBIN A1C: 6.6

## 2013-11-16 MED ORDER — METOPROLOL SUCCINATE ER 25 MG PO TB24: ORAL_TABLET | ORAL | Status: DC

## 2013-11-16 MED ORDER — HYDROCHLOROTHIAZIDE 25 MG PO TABS: 25.0000 mg | ORAL_TABLET | Freq: Every day | ORAL | Status: DC

## 2013-11-16 MED ORDER — CHLORTHALIDONE 100 MG PO TABS: 100.0000 mg | ORAL_TABLET | Freq: Every day | ORAL | Status: DC

## 2013-11-16 MED ORDER — LIVALO 4 MG PO TABS: ORAL_TABLET | ORAL | Status: DC

## 2013-11-16 MED ORDER — METFORMIN HCL 500 MG PO TABS: 500.0000 mg | ORAL_TABLET | Freq: Two times a day (BID) | ORAL | Status: DC

## 2013-11-16 MED ORDER — AMBULATORY NON FORMULARY MEDICATION: Status: DC

## 2013-11-16 MED ORDER — LEVOTHYROXINE SODIUM 137 MCG PO TABS: ORAL_TABLET | ORAL | Status: DC

## 2013-11-16 MED ORDER — LOSARTAN POTASSIUM 100 MG PO TABS: ORAL_TABLET | ORAL | Status: DC

## 2013-11-16 NOTE — Progress Notes (Signed)
   Subjective:    Patient ID: Alexis Mcneil, female    DOB: 1956-09-28, 58 y.o.   MRN: [id number]  HPI  Hypertension- Pt denies chest pain, SOB, dizziness, or heart palpitations.  Taking meds as directed w/o problems.  Denies medication side effects.  Has had some ankle swelling.    IFG - has toe numbness in both feet.  OSA- Has also been experiencing very dry mouth for about the last month and a half. Is causing problems with wearing the CPAP. She thinks her pressures currently set at 8 cm of water. She just feels like it's not working very well anymore. She definitely does not wake up refreshed.  Lipidemia-tolerating statin well without any side effects or problems.  Low back pain - still having a lot of pain in her low back.  Bc of knee issues feels like can't squat normally adn feels that has made her back really "tight". Back pain has been on going for years.  We did xrays one year ago.  Had tx'd acutely.  Says initially her last digit on left foot was going numb and now all her toes on both feet feel like they are falling asleep at time.   Review of Systems     Objective:   Physical Exam  Constitutional: She is oriented to person, place, and time. She appears well-developed and well-nourished.  HENT:  Head: Normocephalic and atraumatic.  Cardiovascular: Normal rate, regular rhythm and normal heart sounds.   Pulmonary/Chest: Effort normal and breath sounds normal.  Musculoskeletal:  Trace ankle edema  bilat  Neurological: She is alert and oriented to person, place, and time.  Skin: Skin is warm and dry.  Psychiatric: She has a normal mood and affect. Her behavior is normal.          Assessment & Plan:  HTN - Uncontrolled. Stop HCTZ and start chlorthalidone.  We'll see if this may help a little bit more with her ankle edema as well as lowering her blood pressure. The chlorthalidone is a little bit more blood pressure lowering power.   DM- - New Dx today. Discussed work on  diet and exercise and will start metformin. Discussed potential S.E of the medication. Will refer for diabetic and nutrition counseling.  F/U in 3 months.   Lab Results  Component Value Date   HGBA1C 6.6 11/16/2013     Sleep apnea-will send over to her home health company to see if they can do an overnight auto titration and also do a download from her current machine to see she's having any significant apnea on her current settings.  OSA - doesn't feel refresshed when wakes up on it.  Hasn't had it checked in 2 years.  Not well rested.   Ankle edema-likely related to venous stasis and elevated blood pressure. We'll try her blood pressure down and see if this helps. Also recommended compression stockings and she does stand on her feet a lot during the day.  Hyperlipidemia-on Livalo currently. Due to repeat CMP and lipids.  Lab Results  Component Value Date   CHOL 165 11/11/2012   HDL 40 11/11/2012   LDLCALC 81 11/11/2012   TRIG 221* 11/11/2012   CHOLHDL 4.1 11/11/2012   Back Pain/Toe numbness - suspect related her her back.  Would like her to see Dr. Darene Lamer for this.  Xray done 11/2012 showing degen disc disease.

## 2013-11-21 LAB — COMPLETE METABOLIC PANEL WITH GFR
ALBUMIN: 4.4 g/dL (ref 3.5–5.2)
ALK PHOS: 71 U/L (ref 39–117)
ALT: 26 U/L (ref 0–35)
AST: 22 U/L (ref 0–37)
BUN: 14 mg/dL (ref 6–23)
CALCIUM: 9.3 mg/dL (ref 8.4–10.5)
CO2: 30 meq/L (ref 19–32)
Chloride: 99 mEq/L (ref 96–112)
Creat: 0.95 mg/dL (ref 0.50–1.10)
GFR, EST AFRICAN AMERICAN: 77 mL/min
GFR, EST NON AFRICAN AMERICAN: 67 mL/min
Glucose, Bld: 135 mg/dL — ABNORMAL HIGH (ref 70–99)
POTASSIUM: 4.1 meq/L (ref 3.5–5.3)
Sodium: 139 mEq/L (ref 135–145)
TOTAL PROTEIN: 6.8 g/dL (ref 6.0–8.3)
Total Bilirubin: 0.6 mg/dL (ref 0.3–1.2)

## 2013-11-21 LAB — LIPID PANEL
CHOL/HDL RATIO: 4 ratio
Cholesterol: 166 mg/dL (ref 0–200)
HDL: 42 mg/dL (ref >39)
LDL Cholesterol: 76 mg/dL (ref 0–99)
Triglycerides: 239 mg/dL — ABNORMAL HIGH (ref <150)
VLDL: 48 mg/dL — ABNORMAL HIGH (ref 0–40)

## 2013-11-21 LAB — TSH: TSH: 3.813 u[IU]/mL (ref 0.350–4.500)

## 2013-11-28 ENCOUNTER — Encounter: Payer: Self-pay | Admitting: Family Medicine

## 2013-12-01 ENCOUNTER — Other Ambulatory Visit: Payer: Self-pay | Admitting: Family Medicine

## 2013-12-01 MED ORDER — ATORVASTATIN CALCIUM 40 MG PO TABS: 40.0000 mg | ORAL_TABLET | Freq: Every day | ORAL | Status: DC

## 2013-12-01 NOTE — Telephone Encounter (Signed)
Which Lipitor dose do you want sent to the pharmacy?

## 2013-12-06 ENCOUNTER — Other Ambulatory Visit: Payer: Self-pay | Admitting: Family Medicine

## 2013-12-08 ENCOUNTER — Encounter: Payer: Self-pay | Admitting: Internal Medicine

## 2013-12-19 ENCOUNTER — Other Ambulatory Visit: Payer: Self-pay | Admitting: Family Medicine

## 2013-12-20 ENCOUNTER — Encounter: Payer: Self-pay | Admitting: Internal Medicine

## 2013-12-27 NOTE — Telephone Encounter (Signed)
error 

## 2014-01-01 ENCOUNTER — Other Ambulatory Visit: Payer: Self-pay | Admitting: Family Medicine

## 2014-01-16 ENCOUNTER — Encounter: Payer: Self-pay | Admitting: Family Medicine

## 2014-02-02 ENCOUNTER — Other Ambulatory Visit: Payer: Self-pay | Admitting: Family Medicine

## 2014-02-08 ENCOUNTER — Other Ambulatory Visit: Payer: Self-pay | Admitting: Family Medicine

## 2014-02-12 ENCOUNTER — Other Ambulatory Visit: Payer: Self-pay

## 2014-02-12 DIAGNOSIS — Z1231 Encounter for screening mammogram for malignant neoplasm of breast: Secondary | ICD-10-CM

## 2014-02-19 ENCOUNTER — Ambulatory Visit (AMBULATORY_SURGERY_CENTER): Payer: Self-pay | Admitting: *Deleted

## 2014-02-19 VITALS — Ht 65.5 in | Wt 240.6 lb

## 2014-02-19 DIAGNOSIS — Z800 Family history of malignant neoplasm of digestive organs: Secondary | ICD-10-CM

## 2014-02-19 MED ORDER — MOVIPREP 100 G PO SOLR: ORAL | Status: DC

## 2014-02-19 NOTE — Progress Notes (Signed)
No egg or soy allergy  No home oxygen use or problems with anesthesia

## 2014-03-02 ENCOUNTER — Ambulatory Visit (AMBULATORY_SURGERY_CENTER): Payer: 59 | Admitting: Internal Medicine

## 2014-03-02 ENCOUNTER — Encounter: Payer: Self-pay | Admitting: Internal Medicine

## 2014-03-02 VITALS — BP 127/62 | HR 66 | Temp 97.8°F | Resp 18 | Ht 65.5 in | Wt 240.0 lb

## 2014-03-02 DIAGNOSIS — Z800 Family history of malignant neoplasm of digestive organs: Secondary | ICD-10-CM

## 2014-03-02 DIAGNOSIS — Z1211 Encounter for screening for malignant neoplasm of colon: Secondary | ICD-10-CM

## 2014-03-02 LAB — GLUCOSE, CAPILLARY
GLUCOSE-CAPILLARY: 115 mg/dL — AB (ref 70–99)
Glucose-Capillary: 134 mg/dL — ABNORMAL HIGH (ref 70–99)

## 2014-03-02 MED ORDER — SODIUM CHLORIDE 0.9 % IV SOLN: 500.0000 mL | INTRAVENOUS | Status: DC

## 2014-03-02 NOTE — Op Note (Signed)
[place]  [address]. [place], [address]   COLONOSCOPY PROCEDURE REPORT  PATIENT: Alexis Mcneil, Alexis Mcneil  MR#: [id number] BIRTHDATE: July 14, 1956 , 22  yrs. old GENDER: Female ENDOSCOPIST: Lafayette Dragon, MD REFERRED MW:[id number] Madilyn Fireman, M.D. PROCEDURE DATE:  03/02/2014 PROCEDURE:   Colonoscopy, screening First Screening Colonoscopy - Avg.  risk and is 50 yrs.  old or older - No.  Prior Negative Screening - Now for repeat screening. Above average risk  History of Adenoma - Now for follow-up colonoscopy & has been > or = to 3 yrs.  N/A  Polyps Removed Today? No.  Recommend repeat exam, <10 yrs? Yes.  High risk (family or personal hx). ASA CLASS:   Class II INDICATIONS:Patient's immediate family history of colon cancer and Erin with colon cancer.  Previous colonoscopies in 2003 and March 2008 were normal. MEDICATIONS: MAC sedation, administered by CRNA and propofol (Diprivan) 200mg  IV  DESCRIPTION OF PROCEDURE:   After the risks benefits and alternatives of the procedure were thoroughly explained, informed consent was obtained.  A digital rectal exam revealed no abnormalities of the rectum.   The     endoscope was introduced through the anus and advanced to the cecum, which was identified by both the appendix and ileocecal valve. No adverse events experienced.   The quality of the prep was good, using MoviPrep The instrument was then slowly withdrawn as the colon was fully examined.      COLON FINDINGS: Mild diverticulosis was noted in the sigmoid colon. Retroflexed views revealed no abnormalities. The time to cecum=3 minutes 24 seconds.  Withdrawal time=7 minutes 00 seconds.  The scope was withdrawn and the procedure completed. COMPLICATIONS: There were no complications.  ENDOSCOPIC IMPRESSION: Mild diverticulosis was noted in the sigmoid colon small internal hemorrhoids  RECOMMENDATIONS: high fiber diet Recall colonoscopy in 5 years   eSigned:   Lafayette Dragon, MD 03/02/2014 9:00 AM   cc:   PATIENT NAME:  Alexis Mcneil MR#: [id number]

## 2014-03-02 NOTE — Progress Notes (Signed)
  [facility] Post-op Note  Patient: Alexis Mcneil  Procedure(s) Performed: colonoscopy  Patient Location: [facility] - Recovery Area  Anesthesia Type: Deep Sedation/Propofol  Level of Consciousness: awake, oriented and patient cooperative  Airway and Oxygen Therapy: Patient Spontanous Breathing  Post-op Pain: none  Post-op Assessment:  Post-op Vital signs reviewed, Patient's Cardiovascular Status Stable, Respiratory Function Stable, Patent Airway, No signs of Nausea or vomiting and Pain level controlled  Post-op Vital Signs: Reviewed and stable  Complications: No apparent anesthesia complications  Dredyn Gubbels E Zaidee Rion 9:03 AM

## 2014-03-02 NOTE — Patient Instructions (Signed)
YOU HAD AN ENDOSCOPIC PROCEDURE TODAY AT THE [facility]: Refer to the procedure report that was given to you for any specific questions about what was found during the examination.  If the procedure report does not answer your questions, please call your gastroenterologist to clarify.  If you requested that your care partner not be given the details of your procedure findings, then the procedure report has been included in a sealed envelope for you to review at your convenience later.  YOU SHOULD EXPECT: Some feelings of bloating in the abdomen. Passage of more gas than usual.  Walking can help get rid of the air that was put into your GI tract during the procedure and reduce the bloating. If you had a lower endoscopy (such as a colonoscopy or flexible sigmoidoscopy) you may notice spotting of blood in your stool or on the toilet paper. If you underwent a bowel prep for your procedure, then you may not have a normal bowel movement for a few days.  DIET: Your first meal following the procedure should be a light meal and then it is ok to progress to your normal diet.  A half-sandwich or bowl of soup is an example of a good first meal.  Heavy or fried foods are harder to digest and may make you feel nauseous or bloated.  Likewise meals heavy in dairy and vegetables can cause extra gas to form and this can also increase the bloating.  Drink plenty of fluids but you should avoid alcoholic beverages for 24 hours.  ACTIVITY: Your care partner should take you home directly after the procedure.  You should plan to take it easy, moving slowly for the rest of the day.  You can resume normal activity the day after the procedure however you should NOT DRIVE or use heavy machinery for 24 hours (because of the sedation medicines used during the test).    SYMPTOMS TO REPORT IMMEDIATELY: A gastroenterologist can be reached at any hour.  During normal business hours, 8:30 AM to 5:00 PM Monday through Friday,  call [phone].  After hours and on weekends, please call the GI answering service at [phone] who will take a message and have the physician on call contact you.   Following lower endoscopy (colonoscopy or flexible sigmoidoscopy):  Excessive amounts of blood in the stool  Significant tenderness or worsening of abdominal pains  Swelling of the abdomen that is new, acute  Fever of 100F or higher    FOLLOW UP: If any biopsies were taken you will be contacted by phone or by letter within the next 1-3 weeks.  Call your gastroenterologist if you have not heard about the biopsies in 3 weeks.  Our staff will call the home number listed on your records the next business day following your procedure to check on you and address any questions or concerns that you may have at that time regarding the information given to you following your procedure. This is a courtesy call and so if there is no answer at the home number and we have not heard from you through the emergency physician on call, we will assume that you have returned to your regular daily activities without incident.  SIGNATURES/CONFIDENTIALITY: You and/or your care partner have signed paperwork which will be entered into your electronic medical record.  These signatures attest to the fact that that the information above on your After Visit Summary has been reviewed and is understood.  Full responsibility of the confidentiality   of this discharge information lies with you and/or your care-partner.     

## 2014-03-05 ENCOUNTER — Telehealth: Payer: Self-pay | Admitting: *Deleted

## 2014-03-05 ENCOUNTER — Ambulatory Visit
Admission: RE | Admit: 2014-03-05 | Discharge: 2014-03-05 | Disposition: A | Discharge status: Home / Self Care | Payer: 59 | Source: Ambulatory Visit

## 2014-03-05 DIAGNOSIS — Z1231 Encounter for screening mammogram for malignant neoplasm of breast: Secondary | ICD-10-CM

## 2014-03-05 NOTE — Telephone Encounter (Signed)
  Follow up Call-  Call back number 03/02/2014  Post procedure Call Back phone  # [phone] or [phone] (leave message here)  Permission to leave phone message Yes     Patient questions:  Do you have a fever, pain , or abdominal swelling? no Pain Score  0 *  Have you tolerated food without any problems? yes  Have you been able to return to your normal activities? yes  Do you have any questions about your discharge instructions: Diet   no Medications  no Follow up visit  no  Do you have questions or concerns about your Care? no  Actions: * If pain score is 4 or above: No action needed, pain <4.

## 2014-03-08 ENCOUNTER — Other Ambulatory Visit: Payer: Self-pay | Admitting: Family Medicine

## 2014-04-13 ENCOUNTER — Other Ambulatory Visit: Payer: Self-pay | Admitting: Family Medicine

## 2014-05-07 ENCOUNTER — Other Ambulatory Visit: Payer: Self-pay | Admitting: Family Medicine

## 2014-05-21 ENCOUNTER — Other Ambulatory Visit: Payer: Self-pay | Admitting: Family Medicine

## 2014-05-28 ENCOUNTER — Other Ambulatory Visit: Payer: Self-pay | Admitting: Family Medicine

## 2014-06-04 ENCOUNTER — Other Ambulatory Visit: Payer: Self-pay | Admitting: Family Medicine

## 2014-06-19 ENCOUNTER — Other Ambulatory Visit: Payer: Self-pay | Admitting: Family Medicine

## 2014-06-26 ENCOUNTER — Ambulatory Visit (INDEPENDENT_AMBULATORY_CARE_PROVIDER_SITE_OTHER): Payer: 59 | Admitting: Family Medicine

## 2014-06-26 ENCOUNTER — Encounter: Payer: Self-pay | Admitting: Family Medicine

## 2014-06-26 VITALS — BP 125/82 | HR 76 | Ht 65.6 in | Wt 229.0 lb

## 2014-06-26 DIAGNOSIS — R209 Unspecified disturbances of skin sensation: Secondary | ICD-10-CM

## 2014-06-26 DIAGNOSIS — I10 Essential (primary) hypertension: Secondary | ICD-10-CM

## 2014-06-26 DIAGNOSIS — E785 Hyperlipidemia, unspecified: Secondary | ICD-10-CM

## 2014-06-26 DIAGNOSIS — R200 Anesthesia of skin: Secondary | ICD-10-CM

## 2014-06-26 DIAGNOSIS — R7301 Impaired fasting glucose: Secondary | ICD-10-CM

## 2014-06-26 LAB — POCT UA - MICROALBUMIN
Albumin/Creatinine Ratio, Urine, POC: <30
Creatinine, POC: 200 mg/dL
MICROALBUMIN (UR) POC: 10 mg/L

## 2014-06-26 LAB — POCT GLYCOSYLATED HEMOGLOBIN (HGB A1C): Hemoglobin A1C: 6.3

## 2014-06-26 MED ORDER — METFORMIN HCL 500 MG PO TABS: ORAL_TABLET | ORAL | Status: DC

## 2014-06-26 MED ORDER — METOPROLOL SUCCINATE ER 25 MG PO TB24: ORAL_TABLET | ORAL | Status: DC

## 2014-06-26 MED ORDER — MELOXICAM 15 MG PO TABS: ORAL_TABLET | ORAL | Status: DC

## 2014-06-26 NOTE — Assessment & Plan Note (Signed)
Well conrolled on current regimen.

## 2014-06-26 NOTE — Assessment & Plan Note (Addendum)
Well controlled. Stable. Her eye exam is scheduled. Urine microalbumin is negative today. F/U in 6 months.

## 2014-06-26 NOTE — Assessment & Plan Note (Signed)
She off her fish oil.  She is on a statin.

## 2014-06-26 NOTE — Progress Notes (Signed)
   Subjective:    Patient ID: Alexis Mcneil, female    DOB: 01/14/1956, 58 y.o.   MRN: [id number]  Diabetes    Diabetes - no hypoglycemic events. No wounds or sores that are not healing well. No increased thirst or urination. Checking glucose at home. Taking medications as prescribed without any side effects.  Has eye exam schedule for next week. Has lost 18 lbs.   Lab Results  Component Value Date   HGBA1C 6.3 06/26/2014   C/O of her toes and ball of her foot going numb.    Hypertension- Pt denies chest pain, SOB, dizziness, or heart palpitations.  Taking meds as directed w/o problems.  Denies medication side effects.    Hyperlipidemia - she is off her fish oil and feeling less achey.  She is still on her statin and not having any cramps this time.   Review of Systems     Objective:   Physical Exam  Constitutional: She is oriented to person, place, and time. She appears well-developed and well-nourished.  HENT:  Head: Normocephalic and atraumatic.  Cardiovascular: Normal rate, regular rhythm and normal heart sounds.   Pulmonary/Chest: Effort normal and breath sounds normal.  Neurological: She is alert and oriented to person, place, and time.  Skin: Skin is warm and dry.  Psychiatric: She has a normal mood and affect. Her behavior is normal.          Assessment & Plan:  For the numbness in her toes I'm going to refer her to or sports medicine doctor for further evaluation. She says initially started just her pinky toes and has gradually spread across all of her toes. This could be related to structural foot issues or possibly her back as she does have chronic low back pain and problems. I reassured her that this is not likely to be from her diabetes as this is such a recent diagnosis and she has been followed for impaired fasting glucose for years.

## 2014-06-27 LAB — BASIC METABOLIC PANEL WITH GFR
BUN: 20 mg/dL (ref 6–23)
CHLORIDE: 99 meq/L (ref 96–112)
CO2: 30 meq/L (ref 19–32)
Calcium: 9 mg/dL (ref 8.4–10.5)
Creat: 1.07 mg/dL (ref 0.50–1.10)
GFR, EST AFRICAN AMERICAN: 67 mL/min
GFR, Est Non African American: 58 mL/min — ABNORMAL LOW
Glucose, Bld: 143 mg/dL — ABNORMAL HIGH (ref 70–99)
POTASSIUM: 3.5 meq/L (ref 3.5–5.3)
SODIUM: 140 meq/L (ref 135–145)

## 2014-06-28 NOTE — Progress Notes (Signed)
Quick Note:  All labs are normal. ______ 

## 2014-07-10 ENCOUNTER — Other Ambulatory Visit: Payer: Self-pay | Admitting: *Deleted

## 2014-07-10 MED ORDER — CHLORTHALIDONE 50 MG PO TABS: ORAL_TABLET | ORAL | Status: DC

## 2014-09-06 ENCOUNTER — Ambulatory Visit (INDEPENDENT_AMBULATORY_CARE_PROVIDER_SITE_OTHER): Payer: 59

## 2014-09-06 ENCOUNTER — Ambulatory Visit (INDEPENDENT_AMBULATORY_CARE_PROVIDER_SITE_OTHER): Payer: 59 | Admitting: Sports Medicine

## 2014-09-06 ENCOUNTER — Encounter: Payer: Self-pay | Admitting: Sports Medicine

## 2014-09-06 VITALS — BP 125/82 | HR 88 | Ht 66.0 in | Wt 226.0 lb

## 2014-09-06 DIAGNOSIS — M170 Bilateral primary osteoarthritis of knee: Secondary | ICD-10-CM

## 2014-09-06 DIAGNOSIS — M48061 Spinal stenosis, lumbar region without neurogenic claudication: Secondary | ICD-10-CM | POA: Insufficient documentation

## 2014-09-06 DIAGNOSIS — M25561 Pain in right knee: Secondary | ICD-10-CM

## 2014-09-06 DIAGNOSIS — M5416 Radiculopathy, lumbar region: Secondary | ICD-10-CM

## 2014-09-06 MED ORDER — PREDNISONE 50 MG PO TABS: ORAL_TABLET | ORAL | Status: DC

## 2014-09-06 MED ORDER — GABAPENTIN 300 MG PO CAPS: ORAL_CAPSULE | ORAL | Status: DC

## 2014-09-06 NOTE — Progress Notes (Signed)
   Subjective:    I'm seeing this patient as a consultation for:  Dr. Madilyn Fireman  CC: Low back pain, knee pain  HPI: Low back pain: Moderate, persistent, radiating down both legs with numbness and tingling. Worse with sitting, and Valsalva. X-rays in the past did show L4-L5 degenerative changes.  Toe numbness: Bilateral, localized in all the toes, no discrete dermatomal distribution. She does have diabetes.  Right knee pain: Post partial meniscectomy, known osteoarthritis, pain is moderate, persistent, localized at the joint line.  Past medical history, Surgical history, Family history not pertinant except as noted below, Social history, Allergies, and medications have been entered into the medical record, reviewed, and no changes needed.   Review of Systems: No headache, visual changes, nausea, vomiting, diarrhea, constipation, dizziness, abdominal pain, skin rash, fevers, chills, night sweats, weight loss, swollen lymph nodes, body aches, joint swelling, muscle aches, chest pain, shortness of breath, mood changes, visual or auditory hallucinations.   Objective:   General: Well Developed, well nourished, and in no acute distress.  Neuro/Psych: Alert and oriented x3, extra-ocular muscles intact, able to move all 4 extremities, sensation grossly intact. Skin: Warm and dry, no rashes noted.  Respiratory: Not using accessory muscles, speaking in full sentences, trachea midline.  Cardiovascular: Pulses palpable, no extremity edema. Abdomen: Does not appear distended. Back Exam:  Inspection: Unremarkable  Motion: Flexion 45 deg, Extension 45 deg, Side Bending to 45 deg bilaterally,  Rotation to 45 deg bilaterally  SLR laying: Negative  XSLR laying: Negative  Palpable tenderness: None. FABER: negative. Sensory change: Gross sensation intact to all lumbar and sacral dermatomes.  Reflexes: 2+ at both patellar tendons, 2+ at achilles tendons, Babinski's downgoing.  Strength at foot    Plantar-flexion: 5/5 Dorsi-flexion: 5/5 Eversion: 5/5 Inversion: 5/5  Leg strength  Quad: 5/5 Hamstring: 5/5 Hip flexor: 5/5 Hip abductors: 5/5  Gait unremarkable. Right Knee: Mild palpable effusion with tenderness at the medial joint line. ROM normal in flexion and extension and lower leg rotation. Ligaments with solid consistent endpoints including ACL, PCL, LCL, MCL. Negative Mcmurray's and provocative meniscal tests. Non painful patellar compression. Patellar and quadriceps tendons unremarkable. Hamstring and quadriceps strength is normal.  Procedure: Real-time Ultrasound Guided Injection of right knee Device: GE Logiq E  Verbal informed consent obtained.  Time-out conducted.  Noted no overlying erythema, induration, or other signs of local infection.  Skin prepped in a sterile fashion.  Local anesthesia: Topical Ethyl chloride.  With sterile technique and under real time ultrasound guidance: Aspirated 5 mL of straw-colored fluid, syringe switched and 2 mL kenalog 40, 4 mL lidocaine injected easily.  Completed without difficulty  Pain immediately resolved suggesting accurate placement of the medication.  Advised to call if fevers/chills, erythema, induration, drainage, or persistent bleeding.  Images permanently stored and available for review in the ultrasound unit.  Impression: Technically successful ultrasound guided injection.  Impression and Recommendations:   This case required medical decision making of moderate complexity.

## 2014-09-06 NOTE — Assessment & Plan Note (Signed)
Bilateral. L4-L5 DDD on x-ray. Formal physical therapy, prednisone, gabapentin. The radicular symptoms in her legs and thighs are from her spine. The numbness and tingling in her distal toes are probably diabetic peripheral neuropathy, both will be improved with gabapentin.

## 2014-09-06 NOTE — Assessment & Plan Note (Signed)
Aspiration and injection as above.  X-rays. Return in one month. 

## 2014-09-07 ENCOUNTER — Encounter: Payer: Self-pay | Admitting: Sports Medicine

## 2014-09-11 ENCOUNTER — Encounter: Payer: Self-pay | Admitting: Sports Medicine

## 2014-09-14 ENCOUNTER — Telehealth: Payer: Self-pay

## 2014-09-14 MED ORDER — IBUPROFEN 800 MG PO TABS: 800.0000 mg | ORAL_TABLET | Freq: Three times a day (TID) | ORAL | Status: DC | PRN

## 2014-09-14 NOTE — Telephone Encounter (Signed)
Called in ibuprofen 800s

## 2014-09-14 NOTE — Telephone Encounter (Signed)
Spoke to patient informed her that pain medication has been sent to pharmacy. Leahmarie Gasiorowski,CMA

## 2014-09-14 NOTE — Telephone Encounter (Signed)
Patient called stated that she is still having hip pain she is requesting a Rx for pain medication sent to her pharmacy. Rhonda Cunningham,CMA

## 2014-09-20 ENCOUNTER — Ambulatory Visit (INDEPENDENT_AMBULATORY_CARE_PROVIDER_SITE_OTHER): Payer: 59 | Admitting: Physical Therapy

## 2014-09-20 DIAGNOSIS — R5381 Other malaise: Secondary | ICD-10-CM

## 2014-09-20 DIAGNOSIS — M5416 Radiculopathy, lumbar region: Secondary | ICD-10-CM

## 2014-09-20 DIAGNOSIS — R262 Difficulty in walking, not elsewhere classified: Secondary | ICD-10-CM

## 2014-09-20 DIAGNOSIS — M2550 Pain in unspecified joint: Secondary | ICD-10-CM

## 2014-09-24 ENCOUNTER — Encounter (INDEPENDENT_AMBULATORY_CARE_PROVIDER_SITE_OTHER): Payer: 59 | Admitting: Physical Therapy

## 2014-09-24 DIAGNOSIS — M5416 Radiculopathy, lumbar region: Secondary | ICD-10-CM

## 2014-09-24 DIAGNOSIS — R5381 Other malaise: Secondary | ICD-10-CM

## 2014-09-24 DIAGNOSIS — M2550 Pain in unspecified joint: Secondary | ICD-10-CM

## 2014-09-24 DIAGNOSIS — R262 Difficulty in walking, not elsewhere classified: Secondary | ICD-10-CM

## 2014-09-26 ENCOUNTER — Encounter (INDEPENDENT_AMBULATORY_CARE_PROVIDER_SITE_OTHER): Payer: 59 | Admitting: Physical Therapy

## 2014-09-26 DIAGNOSIS — M2550 Pain in unspecified joint: Secondary | ICD-10-CM

## 2014-09-26 DIAGNOSIS — R262 Difficulty in walking, not elsewhere classified: Secondary | ICD-10-CM

## 2014-09-26 DIAGNOSIS — M5381 Other specified dorsopathies, occipito-atlanto-axial region: Secondary | ICD-10-CM

## 2014-09-26 DIAGNOSIS — M5416 Radiculopathy, lumbar region: Secondary | ICD-10-CM

## 2014-10-01 ENCOUNTER — Encounter (INDEPENDENT_AMBULATORY_CARE_PROVIDER_SITE_OTHER): Payer: 59 | Admitting: Physical Therapy

## 2014-10-01 DIAGNOSIS — M2550 Pain in unspecified joint: Secondary | ICD-10-CM

## 2014-10-01 DIAGNOSIS — R5381 Other malaise: Secondary | ICD-10-CM

## 2014-10-01 DIAGNOSIS — M5416 Radiculopathy, lumbar region: Secondary | ICD-10-CM

## 2014-10-01 DIAGNOSIS — R262 Difficulty in walking, not elsewhere classified: Secondary | ICD-10-CM

## 2014-10-08 ENCOUNTER — Encounter: Payer: Self-pay | Admitting: Sports Medicine

## 2014-10-08 ENCOUNTER — Ambulatory Visit (INDEPENDENT_AMBULATORY_CARE_PROVIDER_SITE_OTHER): Payer: 59 | Admitting: Sports Medicine

## 2014-10-08 VITALS — BP 136/86 | HR 100 | Ht 66.0 in | Wt 229.0 lb

## 2014-10-08 DIAGNOSIS — M25561 Pain in right knee: Secondary | ICD-10-CM

## 2014-10-08 DIAGNOSIS — M5416 Radiculopathy, lumbar region: Secondary | ICD-10-CM

## 2014-10-08 DIAGNOSIS — M7061 Trochanteric bursitis, right hip: Secondary | ICD-10-CM | POA: Insufficient documentation

## 2014-10-08 DIAGNOSIS — M7062 Trochanteric bursitis, left hip: Secondary | ICD-10-CM

## 2014-10-08 MED ORDER — DICLOFENAC SODIUM 2 % TD SOLN: 2.0000 | Freq: Two times a day (BID) | TRANSDERMAL | Status: DC

## 2014-10-08 NOTE — Patient Instructions (Signed)
Bilateral injection. Work on hip abductor rehabilitation exercises aggressively.

## 2014-10-08 NOTE — Progress Notes (Signed)
  Subjective:    CC: Follow-up  HPI: Lumbar radiculopathy: Improved significantly with physical therapy but still having back pain with left-sided radicular symptoms.  Knee pain: Resolved after right knee injection, only gets occasional twinges at the medial joint line.  Hip pain: Bilateral, localized greater trochanter. Moderate, persistent without radiation.  Past medical history, Surgical history, Family history not pertinant except as noted below, Social history, Allergies, and medications have been entered into the medical record, reviewed, and no changes needed.   Review of Systems: No fevers, chills, night sweats, weight loss, chest pain, or shortness of breath.   Objective:    General: Well Developed, well nourished, and in no acute distress.  Neuro: Alert and oriented x3, extra-ocular muscles intact, sensation grossly intact.  HEENT: Normocephalic, atraumatic, pupils equal round reactive to light, neck supple, no masses, no lymphadenopathy, thyroid nonpalpable.  Skin: Warm and dry, no rashes. Cardiac: Regular rate and rhythm, no murmurs rubs or gallops, no lower extremity edema.  Respiratory: Clear to auscultation bilaterally. Not using accessory muscles, speaking in full sentences. Bilateral Hip: ROM IR: 60 Deg, ER: 60 Deg, Flexion: 120 Deg, Extension: 100 Deg, Abduction: 45 Deg, Adduction: 45 Deg Strength IR: 5/5, ER: 5/5, Flexion: 5/5, Extension: 5/5, Abduction: 5/5, Adduction: 5/5 Pelvic alignment unremarkable to inspection and palpation. Standing hip rotation and gait without trendelenburg / unsteadiness. Greater trochanter with tenderness to palpation bilaterally. No tenderness over piriformis. No SI joint tenderness and normal minimal SI movement.  Procedure:  Injection of right greater trochanteric bursa Consent obtained and verified. Time-out conducted. Noted no overlying erythema, induration, or other signs of local infection. Skin prepped in a sterile  fashion. Topical analgesic spray: Ethyl chloride. Completed without difficulty. Meds: Spinal needle advanced to the greater trochanteric bursa, 1 mL kenalog 40, 4 mL lidocaine injected easily. Pain immediately improved suggesting accurate placement of the medication. Advised to call if fevers/chills, erythema, induration, drainage, or persistent bleeding.  Procedure:  Injection of left greater trochanteric bursa Consent obtained and verified. Time-out conducted. Noted no overlying erythema, induration, or other signs of local infection. Skin prepped in a sterile fashion. Topical analgesic spray: Ethyl chloride. Completed without difficulty. Meds: Spinal needle advanced to the greater trochanteric bursa, 1 mL kenalog 40, 4 mL lidocaine injected easily. Pain immediately improved suggesting accurate placement of the medication. Advised to call if fevers/chills, erythema, induration, drainage, or persistent bleeding.  Impression and Recommendations:

## 2014-10-08 NOTE — Assessment & Plan Note (Signed)
Did extremely well after injection. Occasional pain when the weather is bad, adding topical diclofenac.

## 2014-10-08 NOTE — Assessment & Plan Note (Signed)
Bilateral injection. Work on hip abductor rehabilitation exercises aggressively.

## 2014-10-08 NOTE — Assessment & Plan Note (Signed)
Persistent left-sided radicular symptoms despite formal physical therapy. MRI for interventional injection planning.

## 2014-10-10 ENCOUNTER — Encounter: Payer: Self-pay | Admitting: Sports Medicine

## 2014-10-10 ENCOUNTER — Encounter (INDEPENDENT_AMBULATORY_CARE_PROVIDER_SITE_OTHER): Payer: 59 | Admitting: Physical Therapy

## 2014-10-10 DIAGNOSIS — M5416 Radiculopathy, lumbar region: Secondary | ICD-10-CM

## 2014-10-10 DIAGNOSIS — R262 Difficulty in walking, not elsewhere classified: Secondary | ICD-10-CM

## 2014-10-10 DIAGNOSIS — M25561 Pain in right knee: Secondary | ICD-10-CM

## 2014-10-10 DIAGNOSIS — R5381 Other malaise: Secondary | ICD-10-CM

## 2014-10-10 DIAGNOSIS — M2550 Pain in unspecified joint: Secondary | ICD-10-CM

## 2014-10-10 MED ORDER — DICLOFENAC SODIUM 2 % TD SOLN: 2.0000 | Freq: Two times a day (BID) | TRANSDERMAL | Status: DC

## 2014-10-12 ENCOUNTER — Encounter: Payer: 59 | Admitting: Physical Therapy

## 2014-10-15 ENCOUNTER — Encounter: Payer: 59 | Admitting: Physical Therapy

## 2014-10-18 ENCOUNTER — Encounter: Payer: 59 | Admitting: Physical Therapy

## 2014-10-22 ENCOUNTER — Ambulatory Visit (HOSPITAL_COMMUNITY)
Admission: RE | Admit: 2014-10-22 | Discharge: 2014-10-22 | Disposition: A | Discharge status: Home / Self Care | Payer: 59 | Source: Ambulatory Visit | Attending: Sports Medicine | Admitting: Sports Medicine

## 2014-10-22 DIAGNOSIS — M5416 Radiculopathy, lumbar region: Secondary | ICD-10-CM | POA: Diagnosis present

## 2014-10-29 ENCOUNTER — Encounter: Payer: Self-pay | Admitting: Sports Medicine

## 2014-11-05 ENCOUNTER — Ambulatory Visit (INDEPENDENT_AMBULATORY_CARE_PROVIDER_SITE_OTHER): Payer: 59 | Admitting: Sports Medicine

## 2014-11-05 ENCOUNTER — Encounter: Payer: Self-pay | Admitting: Sports Medicine

## 2014-11-05 DIAGNOSIS — M5416 Radiculopathy, lumbar region: Secondary | ICD-10-CM

## 2014-11-05 NOTE — Progress Notes (Signed)
  Subjective:    CC: MRI results  HPI: Axial back pain: Localize in the midline of the lower lumbar spine, worse with standing and twisting.  Left lumbar radiculopathy: Left-sided, L4 versus L5 distribution, failed conservative measures.  Past medical history, Surgical history, Family history not pertinant except as noted below, Social history, Allergies, and medications have been entered into the medical record, reviewed, and no changes needed.   Review of Systems: No fevers, chills, night sweats, weight loss, chest pain, or shortness of breath.   Objective:    General: Well Developed, well nourished, and in no acute distress.  Neuro: Alert and oriented x3, extra-ocular muscles intact, sensation grossly intact.  HEENT: Normocephalic, atraumatic, pupils equal round reactive to light, neck supple, no masses, no lymphadenopathy, thyroid nonpalpable.  Skin: Warm and dry, no rashes. Cardiac: Regular rate and rhythm, no murmurs rubs or gallops, no lower extremity edema.  Respiratory: Clear to auscultation bilaterally. Not using accessory muscles, speaking in full sentences.  MRI was personally reviewed and shows multilevel facet arthritis, worse at the L5-S1 levels, there is also a large left-sided L4-L5 disc protrusion causing severe left foraminal stenosis.  Impression and Recommendations:

## 2014-11-05 NOTE — Assessment & Plan Note (Signed)
MRI does show severe lumbar spondylosis, the worst levels the L4-L5 level with severe left foraminal stenosis. There is also facetogenic back pain with multilevel facet arthritis. At this point we are going to proceed with a left-sided L4-L5 transforaminal epidural, apply to see her back 2-3 weeks after the injection to a diet response. If continues to have axial back pain we will then proceed with facet injections.

## 2014-11-06 ENCOUNTER — Other Ambulatory Visit: Payer: Self-pay | Admitting: *Deleted

## 2014-11-06 MED ORDER — LEVOTHYROXINE SODIUM 137 MCG PO TABS: ORAL_TABLET | ORAL | Status: DC

## 2014-11-13 ENCOUNTER — Encounter: Payer: Self-pay | Admitting: Sports Medicine

## 2014-11-20 ENCOUNTER — Ambulatory Visit
Admission: RE | Admit: 2014-11-20 | Discharge: 2014-11-20 | Disposition: A | Discharge status: Home / Self Care | Payer: 59 | Source: Ambulatory Visit | Attending: Sports Medicine | Admitting: Sports Medicine

## 2014-11-20 DIAGNOSIS — M5416 Radiculopathy, lumbar region: Secondary | ICD-10-CM

## 2014-11-20 MED ORDER — METHYLPREDNISOLONE ACETATE 40 MG/ML INJ SUSP (RADIOLOG
120.0000 mg | Freq: Once | INTRAMUSCULAR | Status: AC
  Administered 2014-11-20: 120 mg via EPIDURAL

## 2014-11-20 MED ORDER — IOHEXOL 180 MG/ML  SOLN
1.0000 mL | Freq: Once | INTRAMUSCULAR | Status: AC | PRN
  Administered 2014-11-20: 1 mL via EPIDURAL

## 2014-11-20 NOTE — Discharge Instructions (Signed)

## 2014-11-27 ENCOUNTER — Encounter: Payer: Self-pay | Admitting: Family Medicine

## 2014-12-05 ENCOUNTER — Other Ambulatory Visit: Payer: Self-pay | Admitting: Family Medicine

## 2014-12-27 ENCOUNTER — Ambulatory Visit: Payer: 59 | Admitting: Family Medicine

## 2015-01-01 ENCOUNTER — Ambulatory Visit: Payer: Self-pay | Admitting: Family Medicine

## 2015-01-07 ENCOUNTER — Other Ambulatory Visit: Payer: Self-pay | Admitting: Family Medicine

## 2015-01-10 ENCOUNTER — Encounter: Payer: Self-pay | Admitting: Family Medicine

## 2015-01-10 ENCOUNTER — Ambulatory Visit (INDEPENDENT_AMBULATORY_CARE_PROVIDER_SITE_OTHER): Payer: 59 | Admitting: Family Medicine

## 2015-01-10 VITALS — BP 114/69 | HR 79 | Ht 66.0 in | Wt 231.0 lb

## 2015-01-10 DIAGNOSIS — E119 Type 2 diabetes mellitus without complications: Secondary | ICD-10-CM

## 2015-01-10 DIAGNOSIS — G4733 Obstructive sleep apnea (adult) (pediatric): Secondary | ICD-10-CM

## 2015-01-10 DIAGNOSIS — R7989 Other specified abnormal findings of blood chemistry: Secondary | ICD-10-CM

## 2015-01-10 DIAGNOSIS — E038 Other specified hypothyroidism: Secondary | ICD-10-CM

## 2015-01-10 DIAGNOSIS — I10 Essential (primary) hypertension: Secondary | ICD-10-CM

## 2015-01-10 DIAGNOSIS — R700 Elevated erythrocyte sedimentation rate: Secondary | ICD-10-CM

## 2015-01-10 DIAGNOSIS — E790 Hyperuricemia without signs of inflammatory arthritis and tophaceous disease: Secondary | ICD-10-CM

## 2015-01-10 DIAGNOSIS — R509 Fever, unspecified: Secondary | ICD-10-CM

## 2015-01-10 DIAGNOSIS — M2550 Pain in unspecified joint: Secondary | ICD-10-CM

## 2015-01-10 LAB — POCT UA - MICROALBUMIN
CREATININE, POC: 200 mg/dL
Microalbumin Ur, POC: 10 mg/L

## 2015-01-10 LAB — POCT GLYCOSYLATED HEMOGLOBIN (HGB A1C): Hemoglobin A1C: 6.1

## 2015-01-10 MED ORDER — AMBULATORY NON FORMULARY MEDICATION: Status: DC

## 2015-01-10 NOTE — Progress Notes (Signed)
Subjective:    Patient ID: Alexis Mcneil, female    DOB: 08/14/1956, 59 y.o.   MRN: [id number]  HPI Diabetes - no hypoglycemic events. No wounds or sores that are not healing well. No increased thirst or urination. Checking glucose at home. Taking medications as prescribed without any side effects.  Hypertension- Pt denies chest pain, SOB, dizziness, or heart palpitations.  Taking meds as directed w/o problems.  Denies medication side effects.    Hypothyroidism-no recent skin or hair changes. No significant change to energy level.  Runny nose x 1 month and has been having fever intermittantly. She reports it happens about once or twice a month. Says will wake up in AM and will feel like flu and then will run a low grade temp around 99-100 and then next day feels good. When she runs a fever she just feels achy all over. No swollen nodes.  Says has had a lot of nasal congestion. No appetite changes. No HA. No facial pain or pressure. She does complain about an increase in general with joint pain. She says she bottomed mattress to see if it would help. She does mostly sleep on her back because she wears the CPAP and if she sleeps on her sides her arms go numb.  Review of Systems  BP 114/69 mmHg  Pulse 79  Ht 5\' 6"  (1.676 m)  Wt 231 lb (104.781 kg)  BMI 37.30 kg/m2    Allergies  Allergen Reactions  . Niacin And Related Nausea And Vomiting    Passed out and threw up  . Gabapentin Other (See Comments)    Vision changes    Past Medical History  Diagnosis Date  . OSA (obstructive sleep apnea)     CPAP  . IFG (impaired fasting glucose)   . Anemia   . Thyroid disease   . Hyperlipidemia   . Hypertension   . Diabetes mellitus without complication   . GERD (gastroesophageal reflux disease)   . Heart murmur     Past Surgical History  Procedure Laterality Date  . Mouth palate surgery repair  1961    large tear  . Torn ligament shoulder  1975    repaired  . Rt knee surgery  arthroscopy  2009    torn meniscus/medial  . Endometrial polyp      January 2014    History   Social History  . Marital Status: Single    Spouse Name: N/A  . Number of Children: N/A  . Years of Education: N/A   Occupational History  . Not on file.   Social History Main Topics  . Smoking status: Never Smoker   . Smokeless tobacco: Never Used  . Alcohol Use: Yes     Comment: occasional beer  . Drug Use: No  . Sexual Activity: Yes    Birth Control/ Protection: Post-menopausal   Other Topics Concern  . Not on file   Social History Narrative    Family History  Problem Relation Age of Onset  . Colon cancer Father 71    colon/ polyps  . Colon cancer Paternal Grandmother   . Stroke Paternal Grandmother   . Heart attack Paternal Grandmother     heart attack  . Diabetes      grandmother  . Hyperlipidemia Mother   . Hypertension Mother   . Esophageal cancer Neg Hx   . Stomach cancer Neg Hx   . Rectal cancer Neg Hx     Outpatient Encounter Prescriptions as  of 01/10/2015  Medication Sig  . AMBULATORY NON FORMULARY MEDICATION Medication Name: Please schedule auto titration overnight with her CPAP for a single night with download to see if we might need to change her pressure settings. Diagnosis sleep apnea. I would also like to see the download from her machine on her current settings.  Marland Kitchen atorvastatin (LIPITOR) 40 MG tablet TAKE 1 TABLET BY MOUTH ONCE DAILY  . chlorthalidone (HYGROTON) 50 MG tablet TAKE 2 TABLETS BY MOUTH DAILY.  . Diclofenac Sodium 2 % SOLN Place 2 sprays onto the skin 2 (two) times daily.  Marland Kitchen ibuprofen (ADVIL,MOTRIN) 800 MG tablet Take 1 tablet (800 mg total) by mouth every 8 (eight) hours as needed.  Marland Kitchen levothyroxine (SYNTHROID, LEVOTHROID) 137 MCG tablet TAKE 1 TABLET BY MOUTH DAILY BEFORE BREAKFAST.  Marland Kitchen losartan (COZAAR) 100 MG tablet TAKE 1 TABLET BY MOUTH ONCE DAILY  . meloxicam (MOBIC) 15 MG tablet TAKE 1 TABLET BY MOUTH ONCE DAILY  . metFORMIN  (GLUCOPHAGE) 500 MG tablet TAKE 1 TABLET BY MOUTH TWICE DAILY WITH MEALS  . metoprolol succinate (TOPROL-XL) 25 MG 24 hr tablet TAKE 1 TABLET BY MOUTH ONCE DAILY  . pantoprazole (PROTONIX) 40 MG tablet TAKE 1 TABLET BY MOUTH ONCE DAILY  . predniSONE (DELTASONE) 50 MG tablet   . [DISCONTINUED] gabapentin (NEURONTIN) 300 MG capsule   . AMBULATORY NON FORMULARY MEDICATION Medication Name: Please set her CPAP to 9 cm water pressure.  Then download after one week so can review her apneas on 9 cm water pressure.          Objective:   Physical Exam  Constitutional: She is oriented to person, place, and time. She appears well-developed and well-nourished.  HENT:  Head: Normocephalic and atraumatic.  Cardiovascular: Normal rate, regular rhythm and normal heart sounds.   Pulmonary/Chest: Effort normal and breath sounds normal.  Neurological: She is alert and oriented to person, place, and time.  Skin: Skin is warm and dry.  Psychiatric: She has a normal mood and affect. Her behavior is normal.          Assessment & Plan:  DM- well controlled with hemoglobin of 6.1 today. This is actually improvement from previous of 6.3. Monofilament foot exam performed finger microalbumin collected as well. RF losartan.    HTN - Well controlled. F/U in 4-6 months.   Hypothyroidism-due to recheck TSH level.  OSA - Still wearing her CPAP some. She feels like it is not as effective. Will send note with [facility].  Will have them increase it 9, then will download.    Fever - unclear etiology. Happening about once a month the last couple of months. Along with some fatigue. We'll start by checking a CBC with differential. We'll also check a sedimentation rate and CRP for inflammatory markers. No other specific symptoms. She has been having a lot more generalized aches and pains concern tenderness some nodules on some of her joints. We'll go ahead and check rheumatoid factor and anti-CCP as well.

## 2015-01-11 LAB — RHEUMATOID FACTOR: Rhuematoid fact SerPl-aCnc: <10 IU/mL (ref <=14)

## 2015-01-11 LAB — LIPID PANEL
CHOL/HDL RATIO: 3.8 ratio
CHOLESTEROL: 160 mg/dL (ref 0–200)
HDL: 42 mg/dL — AB (ref >=46)
LDL Cholesterol: 67 mg/dL (ref 0–99)
Triglycerides: 253 mg/dL — ABNORMAL HIGH (ref <150)
VLDL: 51 mg/dL — ABNORMAL HIGH (ref 0–40)

## 2015-01-11 LAB — CBC WITH DIFFERENTIAL/PLATELET
BASOS PCT: 1 % (ref 0–1)
Basophils Absolute: 0.1 10*3/uL (ref 0.0–0.1)
EOS ABS: 0.1 10*3/uL (ref 0.0–0.7)
Eosinophils Relative: 2 % (ref 0–5)
HCT: 35.5 % — ABNORMAL LOW (ref 36.0–46.0)
Hemoglobin: 12.4 g/dL (ref 12.0–15.0)
LYMPHS ABS: 1.6 10*3/uL (ref 0.7–4.0)
LYMPHS PCT: 27 % (ref 12–46)
MCH: 30.7 pg (ref 26.0–34.0)
MCHC: 34.9 g/dL (ref 30.0–36.0)
MCV: 87.9 fL (ref 78.0–100.0)
MONOS PCT: 6 % (ref 3–12)
MPV: 9.3 fL (ref 8.6–12.4)
Monocytes Absolute: 0.4 10*3/uL (ref 0.1–1.0)
NEUTROS ABS: 3.8 10*3/uL (ref 1.7–7.7)
NEUTROS PCT: 64 % (ref 43–77)
Platelets: 233 10*3/uL (ref 150–400)
RBC: 4.04 MIL/uL (ref 3.87–5.11)
RDW: 15 % (ref 11.5–15.5)
WBC: 6 10*3/uL (ref 4.0–10.5)

## 2015-01-11 LAB — COMPLETE METABOLIC PANEL WITH GFR
ALT: 21 U/L (ref 0–35)
AST: 14 U/L (ref 0–37)
Albumin: 4.4 g/dL (ref 3.5–5.2)
Alkaline Phosphatase: 73 U/L (ref 39–117)
BILIRUBIN TOTAL: 0.7 mg/dL (ref 0.2–1.2)
BUN: 20 mg/dL (ref 6–23)
CALCIUM: 9.2 mg/dL (ref 8.4–10.5)
CO2: 27 meq/L (ref 19–32)
CREATININE: 1.38 mg/dL — AB (ref 0.50–1.10)
Chloride: 99 mEq/L (ref 96–112)
GFR, EST AFRICAN AMERICAN: 49 mL/min — AB
GFR, Est Non African American: 42 mL/min — ABNORMAL LOW
Glucose, Bld: 126 mg/dL — ABNORMAL HIGH (ref 70–99)
Potassium: 3.2 mEq/L — ABNORMAL LOW (ref 3.5–5.3)
Sodium: 138 mEq/L (ref 135–145)
Total Protein: 7.1 g/dL (ref 6.0–8.3)

## 2015-01-11 LAB — SEDIMENTATION RATE: Sed Rate: 42 mm/hr — ABNORMAL HIGH (ref 0–30)

## 2015-01-11 LAB — TSH: TSH: 2.195 u[IU]/mL (ref 0.350–4.500)

## 2015-01-11 LAB — C-REACTIVE PROTEIN: CRP: 1.8 mg/dL — AB (ref <0.60)

## 2015-01-11 LAB — URIC ACID: Uric Acid, Serum: 9.5 mg/dL — ABNORMAL HIGH (ref 2.4–7.0)

## 2015-01-14 LAB — ANA: Anti Nuclear Antibody(ANA): NEGATIVE

## 2015-01-14 LAB — CYCLIC CITRUL PEPTIDE ANTIBODY, IGG: Cyclic Citrullin Peptide Ab: <2 U/mL (ref 0.0–5.0)

## 2015-01-15 ENCOUNTER — Other Ambulatory Visit: Payer: Self-pay | Admitting: Family Medicine

## 2015-01-18 NOTE — Addendum Note (Signed)
Addended by: Beatrice Lecher D on: 01/18/2015 07:48 AM   Modules accepted: Orders

## 2015-01-22 ENCOUNTER — Other Ambulatory Visit: Payer: Self-pay | Admitting: Family Medicine

## 2015-01-23 ENCOUNTER — Encounter: Payer: Self-pay | Admitting: Family Medicine

## 2015-02-07 ENCOUNTER — Telehealth: Payer: Self-pay | Admitting: Family Medicine

## 2015-02-07 NOTE — Telephone Encounter (Signed)
Pt stated that she feels better when she uses it. She said that it sometimes dries out her eyes and nose.Alexis Mcneil [place]

## 2015-02-07 NOTE — Telephone Encounter (Signed)
Can try apply nasal saline moisturizing gel in nose at bedtime. Made by [employer]pper [employer]liche

## 2015-02-07 NOTE — Telephone Encounter (Signed)
Please call patient: And at the download report from her home health for CPAP. It looks like the 9 cm of water pressure is working very well. Please see if she's noticing if she's feeling better or not.

## 2015-02-08 NOTE — Telephone Encounter (Signed)
Pt informed of recommendations.Alexis Mcneil [place]  

## 2015-02-18 ENCOUNTER — Other Ambulatory Visit: Payer: Self-pay | Admitting: Family Medicine

## 2015-03-01 ENCOUNTER — Encounter: Payer: Self-pay | Admitting: Family Medicine

## 2015-03-04 ENCOUNTER — Other Ambulatory Visit: Payer: Self-pay | Admitting: Family Medicine

## 2015-03-27 ENCOUNTER — Encounter: Payer: Self-pay | Admitting: Internal Medicine

## 2015-04-15 ENCOUNTER — Other Ambulatory Visit: Payer: Self-pay

## 2015-04-15 DIAGNOSIS — Z1231 Encounter for screening mammogram for malignant neoplasm of breast: Secondary | ICD-10-CM

## 2015-04-23 ENCOUNTER — Telehealth: Payer: Self-pay | Admitting: Family

## 2015-04-23 ENCOUNTER — Other Ambulatory Visit: Payer: Self-pay | Admitting: Family Medicine

## 2015-04-23 ENCOUNTER — Encounter: Payer: Self-pay | Admitting: Family Medicine

## 2015-04-23 DIAGNOSIS — E790 Hyperuricemia without signs of inflammatory arthritis and tophaceous disease: Secondary | ICD-10-CM

## 2015-04-23 DIAGNOSIS — J0120 Acute ethmoidal sinusitis, unspecified: Secondary | ICD-10-CM

## 2015-04-23 MED ORDER — AMOXICILLIN-POT CLAVULANATE 875-125 MG PO TABS: 1.0000 | ORAL_TABLET | Freq: Two times a day (BID) | ORAL | Status: DC

## 2015-04-23 NOTE — Progress Notes (Signed)

## 2015-04-24 ENCOUNTER — Other Ambulatory Visit: Payer: Self-pay | Admitting: Family Medicine

## 2015-04-24 LAB — URIC ACID: Uric Acid, Serum: 9.2 mg/dL — ABNORMAL HIGH (ref 2.4–7.0)

## 2015-04-29 ENCOUNTER — Other Ambulatory Visit: Payer: Self-pay | Admitting: Sports Medicine

## 2015-04-30 ENCOUNTER — Other Ambulatory Visit: Payer: Self-pay | Admitting: Family Medicine

## 2015-05-17 ENCOUNTER — Ambulatory Visit
Admission: RE | Admit: 2015-05-17 | Discharge: 2015-05-17 | Disposition: A | Discharge status: Home / Self Care | Payer: 59 | Source: Ambulatory Visit

## 2015-05-17 DIAGNOSIS — Z1231 Encounter for screening mammogram for malignant neoplasm of breast: Secondary | ICD-10-CM

## 2015-05-21 ENCOUNTER — Ambulatory Visit: Payer: 59 | Admitting: Family Medicine

## 2015-05-22 ENCOUNTER — Other Ambulatory Visit: Payer: Self-pay | Admitting: Family Medicine

## 2015-05-27 ENCOUNTER — Encounter: Payer: Self-pay | Admitting: Family Medicine

## 2015-05-27 ENCOUNTER — Ambulatory Visit (INDEPENDENT_AMBULATORY_CARE_PROVIDER_SITE_OTHER): Payer: 59 | Admitting: Family Medicine

## 2015-05-27 ENCOUNTER — Ambulatory Visit: Payer: 59 | Admitting: Family Medicine

## 2015-05-27 VITALS — BP 119/58 | HR 63 | Ht 66.0 in | Wt 216.0 lb

## 2015-05-27 DIAGNOSIS — R7989 Other specified abnormal findings of blood chemistry: Secondary | ICD-10-CM

## 2015-05-27 DIAGNOSIS — J0120 Acute ethmoidal sinusitis, unspecified: Secondary | ICD-10-CM

## 2015-05-27 DIAGNOSIS — Z23 Encounter for immunization: Secondary | ICD-10-CM

## 2015-05-27 DIAGNOSIS — M5416 Radiculopathy, lumbar region: Secondary | ICD-10-CM | POA: Diagnosis not present

## 2015-05-27 DIAGNOSIS — G4733 Obstructive sleep apnea (adult) (pediatric): Secondary | ICD-10-CM

## 2015-05-27 DIAGNOSIS — E119 Type 2 diabetes mellitus without complications: Secondary | ICD-10-CM | POA: Diagnosis not present

## 2015-05-27 DIAGNOSIS — R05 Cough: Secondary | ICD-10-CM

## 2015-05-27 DIAGNOSIS — E790 Hyperuricemia without signs of inflammatory arthritis and tophaceous disease: Secondary | ICD-10-CM

## 2015-05-27 DIAGNOSIS — R059 Cough, unspecified: Secondary | ICD-10-CM

## 2015-05-27 DIAGNOSIS — R0982 Postnasal drip: Secondary | ICD-10-CM

## 2015-05-27 LAB — POCT GLYCOSYLATED HEMOGLOBIN (HGB A1C): HEMOGLOBIN A1C: 6.2

## 2015-05-27 MED ORDER — FLUTICASONE PROPIONATE 50 MCG/ACT NA SUSP: 2.0000 | Freq: Every day | NASAL | Status: DC

## 2015-05-27 MED ORDER — AMOXICILLIN-POT CLAVULANATE 875-125 MG PO TABS: 1.0000 | ORAL_TABLET | Freq: Two times a day (BID) | ORAL | Status: DC

## 2015-05-27 MED ORDER — PANTOPRAZOLE SODIUM 40 MG PO TBEC: DELAYED_RELEASE_TABLET | ORAL | Status: DC

## 2015-05-27 NOTE — Progress Notes (Signed)
   Subjective:    Patient ID: Alexis Mcneil, female    DOB: May 12, 1956, 59 y.o.   MRN: [id number]  HPI Diabetes - no hypoglycemic events. No wounds or sores that are not healing well. No increased thirst or urination. Checking glucose at home. Taking medications as prescribed without any side effects.  She has worked hard to lose weight.    Elevated uric acid level.  - she hasn't started the allopurinol yet She hasn't really had any joint sxs or swelling.  Says the ibuprofen 800mg  is much better than the meloxicam.    Says still waking up his back and hip pain. When went out of town to her sisters and slept on an air matress and her back and hip felt better.  As soon as she got back home her pain returned.    Says has had significant congestion and post nasal drip.  Did an e visit recently for sinus infection about 1 month ago and says took amoxicillin they Rx and she felt immediately better. But once she finished it she started to fill bad again and has had more drainage.  She was running some low-grade fevers and has been wearing them on and off even before her appointment last time.   Review of Systems     Objective:   Physical Exam  Constitutional: She is oriented to person, place, and time. She appears well-developed and well-nourished.  HENT:  Head: Normocephalic and atraumatic.  Right Ear: External ear normal.  Left Ear: External ear normal.  Nose: Nose normal.  Mouth/Throat: Oropharynx is clear and moist.  TMs and canals are clear.   Eyes: Conjunctivae and EOM are normal. Pupils are equal, round, and reactive to light.  Neck: Neck supple. No thyromegaly present.  Cardiovascular: Normal rate, regular rhythm and normal heart sounds.   Pulmonary/Chest: Effort normal and breath sounds normal. She has no wheezes.  Lymphadenopathy:    She has no cervical adenopathy.  Neurological: She is alert and oriented to person, place, and time.  Skin: Skin is warm and dry.  Psychiatric: She  has a normal mood and affect.          Assessment & Plan:  DM- well controlled on current regimen.  F/U in 3 months.  Eye exam done this year at [facility], Dr. Francia Greaves. We will call to get most recent eye exam.  Elevated uric acid - could consider joint fluid ananlysis to actually test for gout as she has never really had a flare.  But certainly her uric acid levels are extremely high. She really is wanting to work on diet and exercise to try to bring it down on her own.  Tdap needs to be updated.    Chronic sinusitis. Discussed that I think she most likely has chronic sinusitis. She fell immediately better on the amoxicillin after she completed it she started to slowly decline again. We'll go ahead and put her on 3 weeks of Augmentin and see if this makes a big difference. If not then consider limited sinus CT for further evaluation. I also encouraged her start a nasal steroid spray. I think she would also benefit from allergy testing but she will need to stop her Benadryl that she takes at night to help her sleep.  OSA - using her CPAP regularly. Takes Benadryl at bedtime to help her sleep.

## 2015-05-27 NOTE — Patient Instructions (Signed)
Flonase 2 sprays in each nostril daily for at least 3 weeks.

## 2015-05-27 NOTE — Addendum Note (Signed)
Addended by: Beatris Ship L on: 05/27/2015 05:07 PM   Modules accepted: Orders

## 2015-05-29 ENCOUNTER — Other Ambulatory Visit: Payer: Self-pay | Admitting: Family Medicine

## 2015-05-31 ENCOUNTER — Other Ambulatory Visit: Payer: Self-pay | Admitting: Family Medicine

## 2015-05-31 MED ORDER — ATORVASTATIN CALCIUM 40 MG PO TABS: 40.0000 mg | ORAL_TABLET | Freq: Every day | ORAL | Status: DC

## 2015-06-06 ENCOUNTER — Other Ambulatory Visit: Payer: Self-pay | Admitting: Family Medicine

## 2015-06-07 LAB — RESPIRATORY ALLERGY PROFILE REGION II ~~LOC~~
Allergen, Cedar tree, t12: <0.1 kU/L
Allergen, Comm Silver Birch, t9: <0.1 kU/L
Allergen, Cottonwood, t14: <0.1 kU/L
Allergen, D pternoyssinus,d7: <0.1 kU/L
Allergen, Mulberry, t76: <0.1 kU/L
Bermuda Grass: <0.1 kU/L
Box Elder IgE: <0.1 kU/L
Cat Dander: <0.1 kU/L
Common Ragweed: <0.1 kU/L
D. farinae: <0.1 kU/L
Dog Dander: <0.1 kU/L
Elm IgE: <0.1 kU/L
IGE (IMMUNOGLOBULIN E), SERUM: 64 kU/L (ref <115)
Johnson Grass: <0.1 kU/L
Oak: <0.1 kU/L
Penicillium Notatum: <0.1 kU/L
Rough Pigweed  IgE: <0.1 kU/L
Sheep Sorrel IgE: <0.1 kU/L
Timothy Grass: <0.1 kU/L

## 2015-06-25 ENCOUNTER — Encounter: Payer: Self-pay | Admitting: Family Medicine

## 2015-07-04 ENCOUNTER — Other Ambulatory Visit: Payer: Self-pay | Admitting: *Deleted

## 2015-07-04 DIAGNOSIS — G629 Polyneuropathy, unspecified: Secondary | ICD-10-CM

## 2015-07-04 DIAGNOSIS — M109 Gout, unspecified: Secondary | ICD-10-CM

## 2015-07-05 LAB — URIC ACID: URIC ACID, SERUM: 7.4 mg/dL — AB (ref 2.4–7.0)

## 2015-07-05 LAB — VITAMIN B12: Vitamin B-12: 287 pg/mL (ref 211–911)

## 2015-07-05 LAB — TSH: TSH: 2.859 u[IU]/mL (ref 0.350–4.500)

## 2015-07-16 ENCOUNTER — Other Ambulatory Visit: Payer: Self-pay | Admitting: Family Medicine

## 2015-08-27 ENCOUNTER — Ambulatory Visit: Payer: 59 | Admitting: Family Medicine

## 2015-08-28 LAB — HM DIABETES EYE EXAM

## 2015-09-17 ENCOUNTER — Other Ambulatory Visit: Payer: Self-pay | Admitting: Family Medicine

## 2015-09-23 ENCOUNTER — Ambulatory Visit (INDEPENDENT_AMBULATORY_CARE_PROVIDER_SITE_OTHER): Payer: 59 | Admitting: Family Medicine

## 2015-09-23 ENCOUNTER — Encounter: Payer: Self-pay | Admitting: Family Medicine

## 2015-09-23 VITALS — BP 107/58 | HR 73 | Temp 97.7°F | Resp 16 | Wt 213.7 lb

## 2015-09-23 DIAGNOSIS — E119 Type 2 diabetes mellitus without complications: Secondary | ICD-10-CM | POA: Diagnosis not present

## 2015-09-23 DIAGNOSIS — E1169 Type 2 diabetes mellitus with other specified complication: Secondary | ICD-10-CM

## 2015-09-23 DIAGNOSIS — R7989 Other specified abnormal findings of blood chemistry: Secondary | ICD-10-CM | POA: Diagnosis not present

## 2015-09-23 DIAGNOSIS — E669 Obesity, unspecified: Secondary | ICD-10-CM

## 2015-09-23 DIAGNOSIS — Z1159 Encounter for screening for other viral diseases: Secondary | ICD-10-CM | POA: Diagnosis not present

## 2015-09-23 DIAGNOSIS — E790 Hyperuricemia without signs of inflammatory arthritis and tophaceous disease: Secondary | ICD-10-CM

## 2015-09-23 DIAGNOSIS — Z23 Encounter for immunization: Secondary | ICD-10-CM

## 2015-09-23 DIAGNOSIS — I10 Essential (primary) hypertension: Secondary | ICD-10-CM

## 2015-09-23 DIAGNOSIS — G609 Hereditary and idiopathic neuropathy, unspecified: Secondary | ICD-10-CM

## 2015-09-23 LAB — POCT GLYCOSYLATED HEMOGLOBIN (HGB A1C): Hemoglobin A1C: 6.4

## 2015-09-23 NOTE — Progress Notes (Signed)
   Subjective:    Patient ID: Alexis Mcneil, female    DOB: 07/28/1956, 59 y.o.   MRN: [id number]  HPI Diabetes - no hypoglycemic events. No wounds or sores that are not healing well. No increased thirst or urination. Checking glucose at home. Taking medications as prescribed without any side effects. She is actually lost 3 more pounds which is fantastic. Though she says she thinks she gained a couple back after she went on vacation.  Hypertension- Pt denies chest pain, SOB, dizziness, or heart palpitations.  Taking meds as directed w/o problems.  Denies medication side effects.  She reports she hasn't had chance to follow back up with the rheumatologist about elevated uric acid levels and arthritis. She is trying to get her uric acid level down with diet etc.  She also complains of burning in both of her feet. This is been going on for well over a year. She said at first her with numbness in her fifth toe in both feet.  Slowly has progressed to numbness in all of her toes. She now gets a burning sensation and it feels like there is a wide of fabric or cotton under the ball of her feet. She says it's worse when she wears shoes and she gets some relief when she takes them off. She's a family feels like her right foot is much worse than her left foot. She does have a history of lumbar degenerative disc disease with a history of radiculopathy.  Review of Systems     Objective:   Physical Exam  Constitutional: She is oriented to person, place, and time. She appears well-developed and well-nourished.  HENT:  Head: Normocephalic and atraumatic.  Cardiovascular: Normal rate, regular rhythm and normal heart sounds.   Pulmonary/Chest: Effort normal and breath sounds normal.  Neurological: She is alert and oriented to person, place, and time.  Skin: Skin is warm and dry.  Psychiatric: She has a normal mood and affect. Her behavior is normal.          Assessment & Plan:  DM- well controlled.  F/U in 3 months.   HTN - well controlled. Continue current regimen.  Elevated uric acid level. Will recheck today.  Peripheral neuropathy-she is Re: Tried gabapentin and was intolerant. 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 We discussed that the next would be to consider EMG studies for further evaluation to see if the neuropathy could be coming from her back versus her diabetes. Her diabetes has always been very well controlled and she has never had any issues. We also did blood work at the last all his visit to rule out B12 deficiency etc. and everything looked normal. Encouraged her to call me when she's ready to move forward with the nerve conduction testing.  Given Pneumonia 23 vaccine.

## 2015-09-24 ENCOUNTER — Encounter: Payer: Self-pay | Admitting: Family Medicine

## 2015-09-24 ENCOUNTER — Other Ambulatory Visit (HOSPITAL_COMMUNITY)
Admission: RE | Admit: 2015-09-24 | Discharge: 2015-09-24 | Disposition: A | Discharge status: Home / Self Care | Payer: 59 | Source: Ambulatory Visit | Attending: Family Medicine | Admitting: Family Medicine

## 2015-09-24 DIAGNOSIS — G609 Hereditary and idiopathic neuropathy, unspecified: Secondary | ICD-10-CM | POA: Insufficient documentation

## 2015-09-24 DIAGNOSIS — Z0000 Encounter for general adult medical examination without abnormal findings: Secondary | ICD-10-CM | POA: Diagnosis present

## 2015-09-24 LAB — COMPREHENSIVE METABOLIC PANEL
ALT: 24 U/L (ref 14–54)
ANION GAP: 9 (ref 5–15)
AST: 19 U/L (ref 15–41)
Albumin: 4.3 g/dL (ref 3.5–5.0)
Alkaline Phosphatase: 76 U/L (ref 38–126)
BUN: 28 mg/dL — ABNORMAL HIGH (ref 6–20)
CALCIUM: 9.5 mg/dL (ref 8.9–10.3)
CHLORIDE: 101 mmol/L (ref 101–111)
CO2: 29 mmol/L (ref 22–32)
CREATININE: 1.22 mg/dL — AB (ref 0.44–1.00)
GFR, EST AFRICAN AMERICAN: 55 mL/min — AB (ref >60)
GFR, EST NON AFRICAN AMERICAN: 48 mL/min — AB (ref >60)
GLUCOSE: 119 mg/dL — AB (ref 65–99)
Potassium: 3.7 mmol/L (ref 3.5–5.1)
SODIUM: 139 mmol/L (ref 135–145)
Total Bilirubin: 1 mg/dL (ref 0.3–1.2)
Total Protein: 7.3 g/dL (ref 6.5–8.1)

## 2015-09-24 LAB — URIC ACID: URIC ACID, SERUM: 8.8 mg/dL — AB (ref 2.3–6.6)

## 2015-09-25 LAB — HEPATITIS C ANTIBODY

## 2015-10-21 ENCOUNTER — Other Ambulatory Visit: Payer: Self-pay | Admitting: Family Medicine

## 2015-11-25 ENCOUNTER — Other Ambulatory Visit: Payer: Self-pay | Admitting: Family Medicine

## 2015-11-25 MED FILL — METOPROLOL SUCC ER 25 MG TA: 25 | 86 days supply | Qty: 86 | Fill #0

## 2015-11-25 MED FILL — PANTOPRAZOLE SOD DR 40 MG T: 40 | 90 days supply | Qty: 90 | Fill #2

## 2015-11-27 MED FILL — MELOXICAM 15 MG TABLET: 15 | 90 days supply | Qty: 90 | Fill #0

## 2015-11-28 MED FILL — LOSARTAN POTASSIUM 100 MG T: 100 | 26 days supply | Qty: 26 | Fill #0

## 2015-12-02 ENCOUNTER — Other Ambulatory Visit: Payer: Self-pay | Admitting: Family Medicine

## 2015-12-02 MED FILL — ATORVASTATIN 40 MG TABLET: 40 | 90 days supply | Qty: 90 | Fill #0

## 2015-12-10 DIAGNOSIS — G4733 Obstructive sleep apnea (adult) (pediatric): Secondary | ICD-10-CM | POA: Diagnosis not present

## 2015-12-23 ENCOUNTER — Other Ambulatory Visit: Payer: Self-pay | Admitting: Family Medicine

## 2015-12-23 MED FILL — LOSARTAN POTASSIUM 100 MG T: 100 | 26 days supply | Qty: 26 | Fill #0

## 2015-12-23 MED FILL — CHLORTHALIDONE 50 MG TABLET: 50 | 30 days supply | Qty: 60 | Fill #0

## 2015-12-25 ENCOUNTER — Other Ambulatory Visit: Payer: Self-pay | Admitting: *Deleted

## 2015-12-25 MED ORDER — LOSARTAN POTASSIUM 100 MG PO TABS: 100.0000 mg | ORAL_TABLET | Freq: Every day | ORAL | Status: DC

## 2016-01-07 ENCOUNTER — Encounter: Payer: Self-pay | Admitting: Family Medicine

## 2016-01-07 ENCOUNTER — Ambulatory Visit (INDEPENDENT_AMBULATORY_CARE_PROVIDER_SITE_OTHER): Payer: 59 | Admitting: Family Medicine

## 2016-01-07 VITALS — BP 103/51 | HR 72 | Wt 220.0 lb

## 2016-01-07 DIAGNOSIS — I10 Essential (primary) hypertension: Secondary | ICD-10-CM

## 2016-01-07 DIAGNOSIS — F4321 Adjustment disorder with depressed mood: Secondary | ICD-10-CM

## 2016-01-07 DIAGNOSIS — G609 Hereditary and idiopathic neuropathy, unspecified: Secondary | ICD-10-CM

## 2016-01-07 DIAGNOSIS — R7989 Other specified abnormal findings of blood chemistry: Secondary | ICD-10-CM

## 2016-01-07 DIAGNOSIS — E119 Type 2 diabetes mellitus without complications: Secondary | ICD-10-CM | POA: Diagnosis not present

## 2016-01-07 DIAGNOSIS — E790 Hyperuricemia without signs of inflammatory arthritis and tophaceous disease: Secondary | ICD-10-CM

## 2016-01-07 DIAGNOSIS — E1169 Type 2 diabetes mellitus with other specified complication: Secondary | ICD-10-CM

## 2016-01-07 DIAGNOSIS — E669 Obesity, unspecified: Secondary | ICD-10-CM

## 2016-01-07 LAB — POCT GLYCOSYLATED HEMOGLOBIN (HGB A1C): Hemoglobin A1C: 6.1

## 2016-01-07 MED ORDER — FLUTICASONE PROPIONATE 50 MCG/ACT NA SUSP: 2.0000 | Freq: Every day | NASAL | Status: DC

## 2016-01-07 MED ORDER — METFORMIN HCL 500 MG PO TABS: 500.0000 mg | ORAL_TABLET | Freq: Two times a day (BID) | ORAL | Status: DC

## 2016-01-07 MED ORDER — LEVOTHYROXINE SODIUM 137 MCG PO TABS: 137.0000 ug | ORAL_TABLET | Freq: Every day | ORAL | Status: DC

## 2016-01-07 MED ORDER — CHLORTHALIDONE 50 MG PO TABS: 100.0000 mg | ORAL_TABLET | Freq: Every day | ORAL | Status: DC

## 2016-01-07 NOTE — Patient Instructions (Signed)
Cut

## 2016-01-07 NOTE — Progress Notes (Signed)
   Subjective:    Patient ID: Alexis Mcneil, female    DOB: 09/28/1956, 60 y.o.   MRN: [id number]  HPI Hypertension- Pt denies chest pain, SOB, dizziness, or heart palpitations.  Taking meds as directed w/o problems.  Denies medication side effects.    Diabetes - no hypoglycemic events. No wounds or sores that are not healing well. No increased thirst or urination. Checking glucose at home. Taking medications as prescribed without any side effects. Has been getting into sweets more lately.   Her father passed away in 12/03/2022 and she has been doing ok.  She has been greiving.   Peripheral neuropathy-she's still having a lot of pain and numbness in her feet. She saw rheumatology at one point. At first they thought she may have gout because she had a significantly elevated uric acid but with dietary changes she was actually able to bring it down and she had never had any symptoms of joint pain. Eventually stop the allopurinol and told her to follow-up in a year.    Review of Systems     Objective:   Physical Exam  Constitutional: She is oriented to person, place, and time. She appears well-developed and well-nourished.  HENT:  Head: Normocephalic and atraumatic.  Cardiovascular: Normal rate, regular rhythm and normal heart sounds.   Pulmonary/Chest: Effort normal and breath sounds normal.  Neurological: She is alert and oriented to person, place, and time.  Skin: Skin is warm and dry.  Psychiatric: She has a normal mood and affect. Her behavior is normal.       Assessment & Plan:  HTN - well controlled. Continue current regimen.    DM- well controlled.  Will call for last eye exam.  Dr. Francia Greaves.  Did encourage her to work on diet and exercise as she has gained some weight there her A1c looks great today.  Grieving-normal grieving response. She is back at work and functioning well.  Elevated uric acid level-due to recheck level. Continue work on diet.  Peripheral  neuropathy-most likely from her diabetes. Though her doses have been very well controlled. Recommend a trial of her diclofenac gel on her feet. She might be a good candidate for one of the topical compounded gels for relief. Or we could always consider an oral agent. She tried gabapentin in the past but it caused vision changes.

## 2016-01-08 ENCOUNTER — Other Ambulatory Visit (HOSPITAL_COMMUNITY)
Admission: RE | Admit: 2016-01-08 | Discharge: 2016-01-08 | Disposition: A | Discharge status: Home / Self Care | Payer: 59 | Source: Ambulatory Visit | Attending: Family Medicine | Admitting: Family Medicine

## 2016-01-08 DIAGNOSIS — M109 Gout, unspecified: Secondary | ICD-10-CM | POA: Diagnosis not present

## 2016-01-08 LAB — BASIC METABOLIC PANEL
Anion gap: 12 (ref 5–15)
BUN: 24 mg/dL — AB (ref 6–20)
CHLORIDE: 101 mmol/L (ref 101–111)
CO2: 27 mmol/L (ref 22–32)
CREATININE: 1.18 mg/dL — AB (ref 0.44–1.00)
Calcium: 9.2 mg/dL (ref 8.9–10.3)
GFR calc Af Amer: 57 mL/min — ABNORMAL LOW (ref >60)
GFR calc non Af Amer: 49 mL/min — ABNORMAL LOW (ref >60)
GLUCOSE: 141 mg/dL — AB (ref 65–99)
POTASSIUM: 3.4 mmol/L — AB (ref 3.5–5.1)
SODIUM: 140 mmol/L (ref 135–145)

## 2016-01-08 LAB — URIC ACID: Uric Acid, Serum: 8.3 mg/dL — ABNORMAL HIGH (ref 2.3–6.6)

## 2016-01-09 ENCOUNTER — Encounter: Payer: Self-pay | Admitting: Family Medicine

## 2016-01-21 MED FILL — LOSARTAN POTASSIUM 100 MG T: 100 | 90 days supply | Qty: 90 | Fill #0

## 2016-01-21 MED FILL — CHLORTHALIDONE 50 MG TABLET: 50 | 90 days supply | Qty: 180 | Fill #0

## 2016-01-21 MED FILL — metFORMIN HCL 500 MG TABS: 500 | 90 days supply | Qty: 180 | Fill #0

## 2016-01-21 MED FILL — LEVOTHYROXINE 137 MCG TAB: 137 | 90 days supply | Qty: 90 | Fill #0

## 2016-02-12 ENCOUNTER — Other Ambulatory Visit (HOSPITAL_COMMUNITY)
Admission: RE | Admit: 2016-02-12 | Discharge: 2016-02-12 | Disposition: A | Discharge status: Home / Self Care | Payer: 59 | Source: Ambulatory Visit | Attending: Obstetrics & Gynecology | Admitting: Obstetrics & Gynecology

## 2016-02-12 ENCOUNTER — Other Ambulatory Visit: Payer: Self-pay | Admitting: Obstetrics & Gynecology

## 2016-02-12 DIAGNOSIS — Z01419 Encounter for gynecological examination (general) (routine) without abnormal findings: Secondary | ICD-10-CM | POA: Diagnosis not present

## 2016-02-12 DIAGNOSIS — Z01411 Encounter for gynecological examination (general) (routine) with abnormal findings: Secondary | ICD-10-CM | POA: Insufficient documentation

## 2016-02-12 DIAGNOSIS — Z1151 Encounter for screening for human papillomavirus (HPV): Secondary | ICD-10-CM | POA: Diagnosis not present

## 2016-02-12 DIAGNOSIS — Z6836 Body mass index (BMI) 36.0-36.9, adult: Secondary | ICD-10-CM | POA: Diagnosis not present

## 2016-02-14 ENCOUNTER — Encounter: Payer: Self-pay | Admitting: Family Medicine

## 2016-02-14 LAB — CYTOLOGY - PAP

## 2016-02-17 ENCOUNTER — Other Ambulatory Visit: Payer: Self-pay | Admitting: Family Medicine

## 2016-02-17 DIAGNOSIS — G609 Hereditary and idiopathic neuropathy, unspecified: Secondary | ICD-10-CM

## 2016-02-19 MED FILL — MELOXICAM 15 MG TABLET: 15 | 90 days supply | Qty: 90 | Fill #1

## 2016-02-19 MED FILL — PANTOPRAZOLE SOD DR 40 MG T: 40 | 90 days supply | Qty: 90 | Fill #3

## 2016-02-28 MED FILL — ATORVASTATIN 40 MG TABLET: 40 | 90 days supply | Qty: 90 | Fill #1

## 2016-03-19 ENCOUNTER — Ambulatory Visit (INDEPENDENT_AMBULATORY_CARE_PROVIDER_SITE_OTHER): Payer: 59 | Admitting: Neurology

## 2016-03-19 ENCOUNTER — Other Ambulatory Visit (INDEPENDENT_AMBULATORY_CARE_PROVIDER_SITE_OTHER): Payer: 59

## 2016-03-19 ENCOUNTER — Encounter: Payer: Self-pay | Admitting: Neurology

## 2016-03-19 VITALS — BP 130/68 | HR 91 | Ht 66.0 in | Wt 225.2 lb

## 2016-03-19 DIAGNOSIS — E1142 Type 2 diabetes mellitus with diabetic polyneuropathy: Secondary | ICD-10-CM | POA: Diagnosis not present

## 2016-03-19 LAB — SEDIMENTATION RATE: Sed Rate: 39 mm/hr — ABNORMAL HIGH (ref 0–22)

## 2016-03-19 LAB — FOLATE: Folate: 23.3 ng/mL (ref >5.9)

## 2016-03-19 LAB — VITAMIN B12: Vitamin B-12: 311 pg/mL (ref 211–911)

## 2016-03-19 MED ORDER — LIDOCAINE 5 % EX OINT: 1.0000 "application " | TOPICAL_OINTMENT | Freq: Two times a day (BID) | CUTANEOUS | Status: DC | PRN

## 2016-03-19 NOTE — Progress Notes (Signed)
[facility] Clinic Note - Initial Visit   Date: 03/19/2016  Alexis Mcneil MRN: [id number] DOB: 01/28/1956   Dear Dr. Madilyn Fireman:  Thank you for your kind referral of Alexis Mcneil for consultation of neuropathy. Although her history is well known to you, please allow [place] to reiterate it for the purpose of our medical record. The patient was accompanied to the clinic by self.   History of Present Illness: Alexis Mcneil is a 60 y.o. right-handed Caucasian female with OSA on CPAP, hyperlipidemia, hypertension, diabetes mellitus, GERD, hypothyroidism,  presenting for evaluation of neuropathy.    Since 2015, she developed low back pain radiating into her left foot.  Over the years, she began having numbness involving both feet and lower legs.  When she is wearing her shoes, she has acute worsening of burning and stinging pain of the feet.  Symptoms are quickly alleviated if she removes her shoes.  She has started to buy shoes a size larger, as to minimize the constriction of the feet.    She saw orthopeadics who thought symptoms were related to diabetes.  She was given gabapentin, but stopped this due to blurry vision. She also had uric acid levels checked which returned elevated and was considering starting allopurinol for gout.  She does not have red, swollen, or inflamed joints.   She also complains of numbness involving the top of both her arm and dorsum of the hand radiating from the neck.  It is worse if she is sleeping on her side.  She has know degenerative changes of the spine and has done neck PT before which has helped. Alexis Mcneil paper records, electronic medical record, and images have been reviewed where available and summarized as:  Lab Results  Component Value Date   TSH 2.859 07/04/2015   Lab Results  Component Value Date   HGBA1C 6.1 01/07/2016   Lab Results  Component Value Date   VITAMINB12 287 07/04/2015    Past Medical History    Diagnosis Date  . OSA (obstructive sleep apnea)     CPAP  . IFG (impaired fasting glucose)   . Anemia   . Thyroid disease   . Hyperlipidemia   . Hypertension   . Diabetes mellitus without complication ([place])   . GERD (gastroesophageal reflux disease)   . Heart murmur     Past Surgical History  Procedure Laterality Date  . Mouth palate surgery repair  1961    large tear  . Torn ligament shoulder  1975    repaired  . Rt knee surgery arthroscopy  2009    torn meniscus/medial  . Endometrial polyp      January 2014     Medications:  Outpatient Encounter Prescriptions as of 03/19/2016  Medication Sig  . AMBULATORY NON FORMULARY MEDICATION Medication Name: Please set her CPAP to 9 cm water pressure.  Then download after one week so can review her apneas on 9 cm water pressure.  Marland Kitchen atorvastatin (LIPITOR) 40 MG tablet TAKE 1 TABLET BY MOUTH ONCE DAILY  . chlorthalidone (HYGROTON) 50 MG tablet Take 2 tablets (100 mg total) by mouth daily.  . Diclofenac Sodium 2 % SOLN Place 2 sprays onto the skin 2 (two) times daily.  . fluticasone (FLONASE) 50 MCG/ACT nasal spray Place 2 sprays into both nostrils daily.  Marland Kitchen ibuprofen (ADVIL,MOTRIN) 800 MG tablet TAKE 1 TABLET BY MOUTH 3 TIMES DAILY AS NEEDED  . levothyroxine (SYNTHROID, LEVOTHROID) 137 MCG  tablet Take 1 tablet (137 mcg total) by mouth daily before breakfast.  . losartan (COZAAR) 100 MG tablet Take 1 tablet (100 mg total) by mouth daily.  . meloxicam (MOBIC) 15 MG tablet TAKE 1 TABLET BY MOUTH ONCE DAILY  . metFORMIN (GLUCOPHAGE) 500 MG tablet Take 1 tablet (500 mg total) by mouth 2 (two) times daily with a meal.  . pantoprazole (PROTONIX) 40 MG tablet TAKE 1 TABLET BY MOUTH ONCE DAILY.   No facility-administered encounter medications on file as of 03/19/2016.     Allergies:  Allergies  Allergen Reactions  . Niacin And Related Nausea And Vomiting    Passed out and threw up  . Gabapentin Other (See Comments)    Vision changes     Family History: Family History  Problem Relation Age of Onset  . Colon cancer Father 41    colon/ polyps  . Colon cancer Paternal Grandmother   . Stroke Paternal Grandmother   . Heart attack Paternal Grandmother     heart attack  . Diabetes      grandmother  . Hyperlipidemia Mother   . Hypertension Mother   . Esophageal cancer Neg Hx   . Stomach cancer Neg Hx   . Rectal cancer Neg Hx     Social History: Social History  Substance Use Topics  . Smoking status: Never Smoker   . Smokeless tobacco: Never Used  . Alcohol Use: Yes     Comment: occasional beer   Social History   Social History Narrative   Lives alone in a 2 story home.  Has no children.  Works for [employer].  Education: associates degree.    Review of Systems:  CONSTITUTIONAL: No fevers, chills, night sweats, or weight loss.   EYES: No visual changes or eye pain ENT: No hearing changes.  No history of nose bleeds.   RESPIRATORY: No cough, wheezing and shortness of breath.   CARDIOVASCULAR: Negative for chest pain, and palpitations.   GI: Negative for abdominal discomfort, blood in stools or black stools.  No recent change in bowel habits.   GU:  No history of incontinence.   MUSCLOSKELETAL: +history of joint pain or swelling.  No myalgias.   SKIN: Negative for lesions, rash, and itching.   HEMATOLOGY/ONCOLOGY: Negative for prolonged bleeding, bruising easily, and swollen nodes.  No history of cancer.   ENDOCRINE: Negative for cold or heat intolerance, polydipsia or goiter.   PSYCH:  No depression or anxiety symptoms.   NEURO: As Above.   Vital Signs:  BP 130/68 mmHg  Pulse 91  Ht _0  (1.676 m)  Wt 225 lb 4 oz (102.173 kg)  BMI 36.37 kg/m2  SpO2 96%   General Medical Exam:   General:  Well appearing, comfortable.   Eyes/ENT: see cranial nerve examination.   Neck: No masses appreciated.  Full range of motion without tenderness.  No carotid bruits. Respiratory:  Clear to  auscultation, good air entry bilaterally.   Cardiac:  Regular rate and rhythm, no murmur.   Extremities:  No deformities, edema, or skin discoloration.  Skin:  No rashes or lesions.  Neurological Exam: MENTAL STATUS including orientation to time, place, person, recent and remote memory, attention span and concentration, language, and fund of knowledge is normal.  Speech is not dysarthric.  CRANIAL NERVES: II:  No visual field defects.  Unremarkable fundi.   III-IV-VI: Pupils equal round and reactive to light.  Normal conjugate, extra-ocular eye movements in all directions of gaze.  No nystagmus.  No ptosis.   V:  Normal facial sensation.     VII:  Normal facial symmetry and movements.  No pathologic facial reflexes.  VIII:  Normal hearing and vestibular function.   IX-X:  Normal palatal movement.   XI:  Normal shoulder shrug and head rotation.   XII:  Normal tongue strength and range of motion, no deviation or fasciculation.  MOTOR:  No atrophy, fasciculations or abnormal movements.  No pronator drift.  Tone is normal.    Right Upper Extremity:    Left Upper Extremity:    Deltoid  5/5   Deltoid  5/5   Biceps  5/5   Biceps  5/5   Triceps  5/5   Triceps  5/5   Wrist extensors  5/5   Wrist extensors  5/5   Wrist flexors  5/5   Wrist flexors  5/5   Finger extensors  5/5   Finger extensors  5/5   Finger flexors  5/5   Finger flexors  5/5   Dorsal interossei  5/5   Dorsal interossei  5/5   Abductor pollicis  5/5   Abductor pollicis  5/5   Tone (Ashworth scale)  0  Tone (Ashworth scale)  0   Right Lower Extremity:    Left Lower Extremity:    Hip flexors  5/5   Hip flexors  5/5   Hip extensors  5/5   Hip extensors  5/5   Knee flexors  5/5   Knee flexors  5/5   Knee extensors  5/5   Knee extensors  5/5   Dorsiflexors  5/5   Dorsiflexors  5/5   Plantarflexors  5/5   Plantarflexors  5/5   Toe extensors  5/5   Toe extensors  5/5   Toe flexors  5/5   Toe flexors  5/5   Tone (Ashworth  scale)  0  Tone (Ashworth scale)  0   MSRs:  Right                                                                 Left brachioradialis 2+  brachioradialis 2+  biceps 2+  biceps 2+  triceps 2+  triceps 2+  patellar 2+  patellar 2+  ankle jerk trace  ankle jerk trace  Hoffman no  Hoffman no  plantar response down  plantar response down   SENSORY:  Vibration is diminished at the great toe only.  Light touch, pinprick, and proprioception is intact.  Romberg's sign absent.   COORDINATION/GAIT: Normal finger-to- nose-finger and heel-to-shin.  Intact rapid alternating movements bilaterally.  Able to rise from a chair without using arms.  Gait narrow based and stable. Tandem and stressed gait intact.    IMPRESSION: Alexis Mcneil is a 60 year-old female referred for evaluation of bilateral feet paresthesias.  Her neurological examination shows a mild distal predominant peripheral neuropathy. Her symptoms are not suggestive of gout.  I had extensive discussion with the patient regarding the pathogenesis, etiology, management, and natural course of neuropathy.  She has very well controlled diabetes, so this would be less likely to cause symptoms, but cannot be excluded as patients with even prediabetes can develop neuropathy.    I will test for other treatable causes of neuropathy and  order NCS/EMG to better assess the severity of symptoms.  Regarding her upper extremities paresthesias, this is more consistent with cervical radiculopathy at C6-7.  PLAN/RECOMMENDATIONS:  1.  Check ESR, vitamin B12, vitamin B1, SPEP with IFE, copper, folate 2.  NCS/EMG of the left arm and leg 3.  Start lidocaine ointment to feet.    Return to clinic in 3 months.   The duration of this appointment visit was 40 minutes of face-to-face time with the patient.  Greater than 50% of this time was spent in counseling, explanation of diagnosis, planning of further management, and coordination of care.   Thank you for  allowing me to participate in patient's care.  If I can answer any additional questions, I would be pleased to do so.    Sincerely,    Donika K. Posey Pronto, DO

## 2016-03-19 NOTE — Patient Instructions (Signed)
Check blood work NCS/EMG of the left arm and leg Start lidocaine ointment to feet.  Use gloves.  Return to clinic in 3 months

## 2016-03-23 LAB — COPPER, SERUM: Copper: 113 ug/dL (ref 72–166)

## 2016-03-24 LAB — PROTEIN ELECTROPHORESIS, SERUM
ALPHA-1-GLOBULIN: 0.3 g/dL (ref 0.2–0.3)
ALPHA-2-GLOBULIN: 0.8 g/dL (ref 0.5–0.9)
Albumin ELP: 4.4 g/dL (ref 3.8–4.8)
Beta 2: 0.4 g/dL (ref 0.2–0.5)
Beta Globulin: 0.5 g/dL (ref 0.4–0.6)
GAMMA GLOBULIN: 0.7 g/dL — AB (ref 0.8–1.7)
Total Protein, Serum Electrophoresis: 7 g/dL (ref 6.1–8.1)

## 2016-03-24 LAB — VITAMIN B1: Vitamin B1 (Thiamine): 11 nmol/L (ref 8–30)

## 2016-03-24 LAB — IMMUNOFIXATION ELECTROPHORESIS
IGM, SERUM: 79 mg/dL (ref 52–322)
IgA: 95 mg/dL (ref 69–380)
IgG (Immunoglobin G), Serum: 712 mg/dL (ref 690–1700)

## 2016-03-25 ENCOUNTER — Encounter: Payer: Self-pay | Admitting: *Deleted

## 2016-04-07 ENCOUNTER — Telehealth: Payer: Self-pay | Admitting: Neurology

## 2016-04-07 ENCOUNTER — Ambulatory Visit (INDEPENDENT_AMBULATORY_CARE_PROVIDER_SITE_OTHER): Payer: 59 | Admitting: Neurology

## 2016-04-07 ENCOUNTER — Ambulatory Visit: Payer: 59 | Admitting: Family Medicine

## 2016-04-07 DIAGNOSIS — E1142 Type 2 diabetes mellitus with diabetic polyneuropathy: Secondary | ICD-10-CM | POA: Diagnosis not present

## 2016-04-07 DIAGNOSIS — G5602 Carpal tunnel syndrome, left upper limb: Secondary | ICD-10-CM

## 2016-04-07 NOTE — Procedures (Signed)
[facility]  [address], [address]  [place], [place] [address] Tel: [phone] Fax:  [phone] Test Date:  04/07/2016  Patient: Alexis Mcneil DOB: [birth year] Physician: Narda Amber, DO  Sex: Female Height: 5\' 4"  Ref Phys: Narda Amber, DO  ID#: [id number] Temp: 33.8C Technician: Jerilynn Mages. Dean   Patient Complaints: This is a 60 year old female referred for evaluation of bilateral foot paresthesias and left hand paresthesias.  NCV & EMG Findings: Extensive electrodiagnostic testing of the left upper and lower extremity shows: 1. Left median sensory nerve shows prolonged distal peak latency (4.3 ms) and normal amplitude.  Left ulnar sensory response is within normal limits. 2. Left median motor nerve showed prolonged distal onset latency (4.4 ms) and normal amplitude. Left ulnar motor responses within normal limits. 3. Left sural and superficial peroneal sensory responses are within normal limits. 4. Left tibial and peroneal motor responses are within normal limits. 5. Left tibial H reflex studies within normal limits. 6. Sparse chronic motor axon loss changes are restricted the flexor digitorum longus muscle. In isolation, these findings are insufficient for definitive diagnosis.  Impression: 1. Left median neuropathy at or distal to the wrist, consistent with the clinical diagnosis of carpal tunnel syndrome. Overall, these findings are mild-to-moderate in degree electrically. 2. There is no evidence of a large fiber generalized sensorimotor polyneuropathy or lumbosacral radiculopathy affecting the left lower extremity.  A small fiber neuropathy cannot be excluded by this study.    ___________________________ Narda Amber, DO    Nerve Conduction Studies Anti Sensory Summary Table   Stim Site NR Peak (ms) Norm Peak (ms) P-T Amp (V) Norm P-T Amp  Left Median Anti Sensory (2nd Digit)  33.8C  Wrist    4.3 <3.6 18.9 >15  Left Sup Peroneal Anti Sensory (Ant Lat Mall)   33.8C  12 cm    3.4 <4.6 7.8 >4  Left Sural Anti Sensory (Lat Mall)  33.8C  Calf    3.0 <4.6 13.5 >4  Left Ulnar Anti Sensory (5th Digit)  33.8C  Wrist    2.7 <3.1 13.5 >10   Motor Summary Table   Stim Site NR Onset (ms) Norm Onset (ms) O-P Amp (mV) Norm O-P Amp Site1 Site2 Delta-0 (ms) Dist (cm) Vel (m/s) Norm Vel (m/s)  Left Median Motor (Abd Poll Brev)  33.8C  Wrist    4.4 <4.0 10.9 >6 Elbow Wrist 3.6 21.0 58 >50  Elbow    8.0  9.4         Left Peroneal Motor (Ext Dig Brev)  33.8C  Ankle    3.2 <6.0 3.5 >2.5 B Fib Ankle 6.8 31.0 46 >40  B Fib    10.0  3.1  Poplt B Fib 1.7 10.0 59 >40  Poplt    11.7  3.1         Left Tibial Motor (Abd Hall Brev)  33.8C  Ankle    3.5 <6.0 4.1 >4 Knee Ankle 8.1 37.0 46 >40  Knee    11.6  3.0         Left Ulnar Motor (Abd Dig Minimi)  33.8C  Wrist    2.7 <3.1 10.4 >7 B Elbow Wrist 3.2 19.0 59 >50  B Elbow    5.9  9.7  A Elbow B Elbow 1.6 10.0 63 >50  A Elbow    7.5  9.4          H Reflex Studies   NR H-Lat (ms) Lat Norm (ms) L-R H-Lat (  ms) M-Lat (ms) HLat-MLat (ms)  Left Tibial (Gastroc)  33.8C     34.15 <35  4.22 29.93   EMG   Side Muscle Ins Act Fibs Psw Fasc Number Recrt Dur Dur. Amp Amp. Poly Poly. Comment  Left AntTibialis Nml Nml Nml Nml Nml Nml Nml Nml Nml Nml Nml Nml N/A  Left Gastroc Nml Nml Nml Nml Nml Nml Nml Nml Nml Nml Nml Nml N/A  Left Flex Dig Long Nml Nml Nml Nml 1- Mod-R Few 1+ Few 1+ Nml Nml N/A  Left RectFemoris Nml Nml Nml Nml Nml Nml Nml Nml Nml Nml Nml Nml N/A  Left GluteusMed Nml Nml Nml Nml Nml Nml Nml Nml Nml Nml Nml Nml N/A  Left 1stDorInt Nml Nml Nml Nml Nml Nml Nml Nml Nml Nml Nml Nml N/A  Left Abd Poll Brev Nml Nml Nml Nml Nml Nml Nml Nml Nml Nml Nml Nml N/A  Left Ext Indicis Nml Nml Nml Nml Nml Nml Nml Nml Nml Nml Nml Nml N/A  Left PronatorTeres Nml Nml Nml Nml Nml Nml Nml Nml Nml Nml Nml Nml N/A  Left Biceps Nml Nml Nml Nml Nml Nml Nml Nml Nml Nml Nml Nml N/A  Left Triceps Nml Nml Nml Nml Nml Nml Nml  Nml Nml Nml Nml Nml N/A  Left Deltoid Nml Nml Nml Nml Nml Nml Nml Nml Nml Nml Nml Nml N/A      Waveforms:

## 2016-04-07 NOTE — Telephone Encounter (Signed)
Results of EMG discussed with patient which shows left carpal tunnel syndrome. She was instructed to start using a wrist splint as needed. There is no evidence of a peripheral neuropathy affecting the feet. She most likely has a small fiber neuropathy, skin biopsy discussed, patient declined as it would mostly not change management.  Donika K. Posey Pronto, DO

## 2016-04-16 ENCOUNTER — Encounter: Payer: Self-pay | Admitting: Family Medicine

## 2016-04-16 ENCOUNTER — Ambulatory Visit (INDEPENDENT_AMBULATORY_CARE_PROVIDER_SITE_OTHER): Payer: 59 | Admitting: Family Medicine

## 2016-04-16 ENCOUNTER — Ambulatory Visit (INDEPENDENT_AMBULATORY_CARE_PROVIDER_SITE_OTHER): Payer: 59 | Admitting: Sports Medicine

## 2016-04-16 VITALS — BP 127/56 | HR 75 | Wt 221.0 lb

## 2016-04-16 DIAGNOSIS — E119 Type 2 diabetes mellitus without complications: Secondary | ICD-10-CM | POA: Diagnosis not present

## 2016-04-16 DIAGNOSIS — E669 Obesity, unspecified: Secondary | ICD-10-CM

## 2016-04-16 DIAGNOSIS — I10 Essential (primary) hypertension: Secondary | ICD-10-CM

## 2016-04-16 DIAGNOSIS — M25561 Pain in right knee: Secondary | ICD-10-CM

## 2016-04-16 DIAGNOSIS — R7989 Other specified abnormal findings of blood chemistry: Secondary | ICD-10-CM | POA: Diagnosis not present

## 2016-04-16 DIAGNOSIS — G609 Hereditary and idiopathic neuropathy, unspecified: Secondary | ICD-10-CM

## 2016-04-16 DIAGNOSIS — E1169 Type 2 diabetes mellitus with other specified complication: Secondary | ICD-10-CM

## 2016-04-16 DIAGNOSIS — E790 Hyperuricemia without signs of inflammatory arthritis and tophaceous disease: Secondary | ICD-10-CM

## 2016-04-16 LAB — POCT GLYCOSYLATED HEMOGLOBIN (HGB A1C): Hemoglobin A1C: 6

## 2016-04-16 NOTE — Progress Notes (Signed)
Subjective:    CC: DM  HPI:  Diabetes - no hypoglycemic events. No wounds or sores that are not healing well. No increased thirst or urination. Checking glucose at home. Taking medications as prescribed without any side effects.She has lost 4 pounds since she was last year.  Hypertension- Pt denies chest pain, SOB, dizziness, or heart palpitations.  Taking meds as directed w/o problems.  Denies medication side effects.  She is just taking her chlorthalidone as needed. we stopped her metoprolol last time.  She did see Dr. Posey Pronto for her neuropathy and had a fairly normal EMG study.  Past medical history, Surgical history, Family history not pertinant except as noted below, Social history, Allergies, and medications have been entered into the medical record, reviewed, and corrections made.   Review of Systems: No fevers, chills, night sweats, weight loss, chest pain, or shortness of breath.   Objective:    General: Well Developed, well nourished, and in no acute distress.  Neuro: Alert and oriented x3, extra-ocular muscles intact, sensation grossly intact.  HEENT: Normocephalic, atraumatic  Skin: Warm and dry, no rashes. Cardiac: Regular rate and rhythm, no murmurs rubs or gallops, no lower extremity edema.  Respiratory: Clear to auscultation bilaterally. Not using accessory muscles, speaking in full sentences.   Impression and Recommendations:   HTN-Well-controlled. Looks fantastic off of metoprolol.  DM- hemoglobin A1c at goal. She would like to get it down further. She admits she hasn't been doing her best with her diet. Continue work on diet and exercise. Lab Results  Component Value Date   HGBA1C 6.0 04/16/2016   Peripheral neuropathy-Continues to get intermittent numbness and burning. But some days it doesn't bother her at all. She's not really interested in the lidocaine gel. Next  Elevated uric acid. I still think we should try the allopurinol. She never started it really  want to work on diet and exercise. She was able to get it down significantly but has been slowly crept back up. Encouraged her to think about restarting allopurinol. She was not ready to commit to it at this point in time.

## 2016-04-16 NOTE — Assessment & Plan Note (Signed)
Did well after injection and 2015, repeat injection today. Return in one month.

## 2016-04-16 NOTE — Progress Notes (Signed)
  Subjective:    CC: Right knee pain  HPI: This is a pleasant 60 year old female, she comes in with recurrence of pain in her right knee, she has known osteoarthritis, we injected her approximately a year and a half ago and she had a good response until recently. Pain is predominantly on the posterior aspect of the knee, with inability to flex past 90, moderate, persistent without radiation.  Past medical history, Surgical history, Family history not pertinant except as noted below, Social history, Allergies, and medications have been entered into the medical record, reviewed, and no changes needed.   Review of Systems: No fevers, chills, night sweats, weight loss, chest pain, or shortness of breath.   Objective:    General: Well Developed, well nourished, and in no acute distress.  Neuro: Alert and oriented x3, extra-ocular muscles intact, sensation grossly intact.  HEENT: Normocephalic, atraumatic, pupils equal round reactive to light, neck supple, no masses, no lymphadenopathy, thyroid nonpalpable.  Skin: Warm and dry, no rashes. Cardiac: Regular rate and rhythm, no murmurs rubs or gallops, no lower extremity edema.  Respiratory: Clear to auscultation bilaterally. Not using accessory muscles, speaking in full sentences. Right Knee: Normal to inspection with no erythema or effusion or obvious bony abnormalities. Tender to palpation at the anteromedial joint line, range of motion is listed past 90 of flexion Ligaments with solid consistent endpoints including ACL, PCL, LCL, MCL. Negative Mcmurray's and provocative meniscal tests. Non painful patellar compression. Patellar and quadriceps tendons unremarkable. Hamstring and quadriceps strength is normal.  Procedure: Real-time Ultrasound Guided Injection of right knee Device: GE Logiq E  Verbal informed consent obtained.  Time-out conducted.  Noted no overlying erythema, induration, or other signs of local infection.  Skin prepped in  a sterile fashion.  Local anesthesia: Topical Ethyl chloride.  With sterile technique and under real time ultrasound guidance:  1 mL kenalog 40, 2 mL lidocaine, 2 mL Marcaine injected easily. Completed without difficulty  Pain immediately resolved suggesting accurate placement of the medication.  Advised to call if fevers/chills, erythema, induration, drainage, or persistent bleeding.  Images permanently stored and available for review in the ultrasound unit.  Impression: Technically successful ultrasound guided injection.  Impression and Recommendations:

## 2016-04-21 MED FILL — LOSARTAN POTASSIUM 100 MG T: 100 | 90 days supply | Qty: 90 | Fill #1

## 2016-04-21 MED FILL — IBUPROFEN 800 MG TABLET: 800 | 20 days supply | Qty: 60 | Fill #2

## 2016-05-01 MED FILL — LEVOTHYROXINE 137 MCG TAB: 137 | 90 days supply | Qty: 90 | Fill #1

## 2016-05-04 MED FILL — metFORMIN HCL 500 MG TABS: 500 | 90 days supply | Qty: 180 | Fill #1

## 2016-05-18 ENCOUNTER — Other Ambulatory Visit: Payer: Self-pay | Admitting: Family Medicine

## 2016-05-18 MED FILL — PANTOPRAZOLE SOD DR 40 MG T: 40 | 90 days supply | Qty: 90 | Fill #0

## 2016-05-18 MED FILL — MELOXICAM 15 MG TABLET: 15 | 90 days supply | Qty: 90 | Fill #0

## 2016-05-18 MED FILL — CHLORTHALIDONE 50 MG TABLET: 50 | 90 days supply | Qty: 180 | Fill #1

## 2016-05-19 ENCOUNTER — Ambulatory Visit (INDEPENDENT_AMBULATORY_CARE_PROVIDER_SITE_OTHER): Payer: 59 | Admitting: Sports Medicine

## 2016-05-19 ENCOUNTER — Encounter: Payer: Self-pay | Admitting: Sports Medicine

## 2016-05-19 VITALS — BP 118/70 | HR 94 | Resp 18 | Wt 218.9 lb

## 2016-05-19 DIAGNOSIS — M25561 Pain in right knee: Secondary | ICD-10-CM | POA: Diagnosis not present

## 2016-05-19 NOTE — Progress Notes (Signed)
  Subjective:    CC: Follow-up  HPI: I injected Alexis Mcneil's right knee month ago, she returns pain-free.  Past medical history, Surgical history, Family history not pertinant except as noted below, Social history, Allergies, and medications have been entered into the medical record, reviewed, and no changes needed.   Review of Systems: No fevers, chills, night sweats, weight loss, chest pain, or shortness of breath.   Objective:    General: Well Developed, well nourished, and in no acute distress.  Neuro: Alert and oriented x3, extra-ocular muscles intact, sensation grossly intact.  HEENT: Normocephalic, atraumatic, pupils equal round reactive to light, neck supple, no masses, no lymphadenopathy, thyroid nonpalpable.  Skin: Warm and dry, no rashes. Cardiac: Regular rate and rhythm, no murmurs rubs or gallops, no lower extremity edema.  Respiratory: Clear to auscultation bilaterally. Not using accessory muscles, speaking in full sentences. Right Knee: Normal to inspection with no erythema or effusion or obvious bony abnormalities. Palpation normal with no warmth or joint line tenderness or patellar tenderness or condyle tenderness. ROM normal in flexion and extension and lower leg rotation. Ligaments with solid consistent endpoints including ACL, PCL, LCL, MCL. Negative Mcmurray's and provocative meniscal tests. Non painful patellar compression. Patellar and quadriceps tendons unremarkable. Hamstring and quadriceps strength is normal.  Impression and Recommendations:

## 2016-05-19 NOTE — Assessment & Plan Note (Signed)
Resolved after injection of month ago, return as needed. Information given on viscosupplementation.

## 2016-06-02 ENCOUNTER — Other Ambulatory Visit: Payer: Self-pay | Admitting: Family Medicine

## 2016-06-02 DIAGNOSIS — Z1231 Encounter for screening mammogram for malignant neoplasm of breast: Secondary | ICD-10-CM

## 2016-06-05 ENCOUNTER — Other Ambulatory Visit: Payer: Self-pay | Admitting: Family Medicine

## 2016-06-05 MED FILL — ATORVASTATIN 40 MG TABLET: 40 | 90 days supply | Qty: 90 | Fill #0

## 2016-06-11 ENCOUNTER — Ambulatory Visit
Admission: RE | Admit: 2016-06-11 | Discharge: 2016-06-11 | Disposition: A | Discharge status: Home / Self Care | Payer: 59 | Source: Ambulatory Visit | Attending: Family Medicine | Admitting: Family Medicine

## 2016-06-11 DIAGNOSIS — Z1231 Encounter for screening mammogram for malignant neoplasm of breast: Secondary | ICD-10-CM

## 2016-06-19 DIAGNOSIS — G4733 Obstructive sleep apnea (adult) (pediatric): Secondary | ICD-10-CM | POA: Diagnosis not present

## 2016-06-22 ENCOUNTER — Ambulatory Visit: Payer: 59 | Admitting: Neurology

## 2016-06-26 ENCOUNTER — Ambulatory Visit (INDEPENDENT_AMBULATORY_CARE_PROVIDER_SITE_OTHER): Payer: 59 | Admitting: Neurology

## 2016-06-26 ENCOUNTER — Encounter: Payer: Self-pay | Admitting: Neurology

## 2016-06-26 VITALS — BP 120/64 | HR 92 | Ht 66.0 in | Wt 222.3 lb

## 2016-06-26 DIAGNOSIS — E1142 Type 2 diabetes mellitus with diabetic polyneuropathy: Secondary | ICD-10-CM | POA: Diagnosis not present

## 2016-06-26 MED ORDER — LIDOCAINE 5 % EX OINT: TOPICAL_OINTMENT | CUTANEOUS | 3 refills | Status: DC

## 2016-06-26 MED FILL — LIDOCAINE 5% OINTMENT: 5 | 10 days supply | Qty: 30 | Fill #0

## 2016-06-26 NOTE — Progress Notes (Signed)
Follow-up Visit   Date: 06/26/16    Alexis Mcneil MRN: [id number] DOB: [birth year]   Interim History: Alexis Mcneil is a 60 y.o. right-handed Caucasian female with OSA on CPAP, hyperlipidemia, hypertension, diabetes mellitus, GERD, hypothyroidism returning to the clinic for follow-up of neuropathy.  The patient was accompanied to the clinic by self.  History of present illness: Since 2015, she developed low back pain radiating into her left foot.  Over the years, she began having numbness involving both feet and lower legs.  When she is wearing her shoes, she has acute worsening of burning and stinging pain of the feet.  Symptoms are quickly alleviated if she removes her shoes.  She has started to buy shoes a size larger, as to minimize the constriction of the feet.    She saw orthopeadics who thought symptoms were related to diabetes.  She was given gabapentin, but stopped this due to blurry vision. She also had uric acid levels checked which returned elevated and was considering starting allopurinol for gout.  She does not have red, swollen, or inflamed joints.   She also complains of numbness involving the top of both her arm and dorsum of the hand radiating from the neck.  It is worse if she is sleeping on her side.  She has know degenerative changes of the spine and has done neck PT before which has helped.    UPDATE 06/26/2016:  She continues to have burning pain in her feet and numbness.  NCS/EMG did not show neuropathy.  She denies restless sensation.  She denies any weakness or imbalance. She is frustrated about her feet, because the numbness prevents her from staying active.   She denies any numbness/tingling of the arms.    Medications:  Current Outpatient Prescriptions on File Prior to Visit  Medication Sig Dispense Refill  . AMBULATORY NON FORMULARY MEDICATION Medication Name: Please set her CPAP to 9 cm water pressure.  Then download after one week so can review her  apneas on 9 cm water pressure. 1 vial 0  . atorvastatin (LIPITOR) 40 MG tablet Take 1 tablet (40 mg total) by mouth daily. Needs lipid panel 90 tablet 0  . chlorthalidone (HYGROTON) 50 MG tablet Take 2 tablets (100 mg total) by mouth daily. 180 tablet 3  . Diclofenac Sodium 2 % SOLN Place 2 sprays onto the skin 2 (two) times daily. 1 Bottle 11  . ibuprofen (ADVIL,MOTRIN) 800 MG tablet TAKE 1 TABLET BY MOUTH 3 TIMES DAILY AS NEEDED 60 tablet 2  . levothyroxine (SYNTHROID, LEVOTHROID) 137 MCG tablet Take 1 tablet (137 mcg total) by mouth daily before breakfast. 90 tablet 1  . losartan (COZAAR) 100 MG tablet Take 1 tablet (100 mg total) by mouth daily. 90 tablet 1  . meloxicam (MOBIC) 15 MG tablet TAKE 1 TABLET BY MOUTH ONCE DAILY 90 tablet 1  . metFORMIN (GLUCOPHAGE) 500 MG tablet Take 1 tablet (500 mg total) by mouth 2 (two) times daily with a meal. 180 tablet 2  . pantoprazole (PROTONIX) 40 MG tablet TAKE 1 TABLET BY MOUTH ONCE DAILY. 90 tablet 3   No current facility-administered medications on file prior to visit.     Allergies:  Allergies  Allergen Reactions  . Niacin And Related Nausea And Vomiting    Passed out and threw up  . Gabapentin Other (See Comments)    Vision changes    Review of Systems:  CONSTITUTIONAL: No fevers, chills, night sweats, or weight loss.  EYES: No visual changes or eye pain ENT: No hearing changes.  No history of nose bleeds.   RESPIRATORY: No cough, wheezing and shortness of breath.   CARDIOVASCULAR: Negative for chest pain, and palpitations.   GI: Negative for abdominal discomfort, blood in stools or black stools.  No recent change in bowel habits.   GU:  No history of incontinence.   MUSCLOSKELETAL: No history of joint pain or swelling.  No myalgias.   SKIN: Negative for lesions, rash, and itching.   ENDOCRINE: Negative for cold or heat intolerance, polydipsia or goiter.   PSYCH:  No depression or anxiety symptoms.   NEURO: As Above.   Vital  Signs:  BP 120/64   Pulse 92   Ht '5\' 6"'  (1.676 m)   Wt 222 lb 5 oz (100.8 kg)   SpO2 95%   BMI 35.88 kg/m   Neurological Exam: MENTAL STATUS including orientation to time, place, person, recent and remote memory, attention span and concentration, language, and fund of knowledge is normal.  Speech is not dysarthric.  CRANIAL NERVES:  Face is symmetric.   MOTOR:  Motor strength is 5/5 in all extremities No pronator drift.  Tone is normal.    MSRs:  Reflexes are 2+/4 throughout, except trace at Achilles bilaterally.  SENSORY:  Vibration is diminished at the great toe only  COORDINATION/GAIT:  Gait narrow based and stable. Tandem gait intact.  Data: NCS/EMG 04/07/2016:  1. Left median neuropathy at or distal to the wrist, consistent with the clinical diagnosis of carpal tunnel syndrome. Overall, these findings are mild-to-moderate in degree electrically. 2. There is no evidence of a large fiber generalized sensorimotor polyneuropathy or lumbosacral radiculopathy affecting the left lower extremity.  A small fiber neuropathy cannot be excluded by this study.  Labs 03/19/2016:  ESR 39, vitamin B12 311, vitamin B1 11, SPEP with IFE no M protein, copper 113, folate 23   IMPRESSION/PLAN: 1.  Bilateral feet paresthesias.  History is very suggestive of distal and symmetric neuropathy, but there is no evidence of large fiber neuropathy on EMG, I have high clinical suspicion that she has small fiber neuropathy.  Discussed doing skin biopsy to look for small fiber neuropathy, but patient declined additional testing at this time.   - Start lidocaine ointment to feet for symptom management  - She did not tolerate gabapentin and prefers to avoid oral medications  - Plan to refer to podiatry for skin biopsy, if symptoms get worse  2.  Left carpal tunnel syndrome, asymptomatic   Return to clinic as needed.  The duration of this appointment visit was 25 minutes of face-to-face time with the patient.   Greater than 50% of this time was spent in counseling, explanation of diagnosis, planning of further management, and coordination of care.   Thank you for allowing me to participate in patient's care.  If I can answer any additional questions, I would be pleased to do so.    Sincerely,    Donika K. Posey Pronto, DO

## 2016-06-26 NOTE — Patient Instructions (Signed)
1.  Start lidocaine ointment to your feet 2.  If symptoms worsen, please come back and see me

## 2016-07-22 ENCOUNTER — Other Ambulatory Visit: Payer: Self-pay | Admitting: Family Medicine

## 2016-07-22 MED FILL — LOSARTAN POTASSIUM 100 MG T: 100 | 30 days supply | Qty: 30 | Fill #0

## 2016-07-24 ENCOUNTER — Encounter: Payer: Self-pay | Admitting: Family Medicine

## 2016-08-03 MED FILL — metFORMIN HCL 500 MG TABS: 500 | 90 days supply | Qty: 180 | Fill #2

## 2016-08-04 ENCOUNTER — Other Ambulatory Visit: Payer: Self-pay | Admitting: Family Medicine

## 2016-08-04 ENCOUNTER — Encounter: Payer: Self-pay | Admitting: Family Medicine

## 2016-08-04 ENCOUNTER — Ambulatory Visit (INDEPENDENT_AMBULATORY_CARE_PROVIDER_SITE_OTHER): Payer: 59 | Admitting: Family Medicine

## 2016-08-04 VITALS — BP 136/59 | HR 81 | Wt 222.0 lb

## 2016-08-04 DIAGNOSIS — E669 Obesity, unspecified: Secondary | ICD-10-CM | POA: Diagnosis not present

## 2016-08-04 DIAGNOSIS — E1169 Type 2 diabetes mellitus with other specified complication: Secondary | ICD-10-CM

## 2016-08-04 DIAGNOSIS — G609 Hereditary and idiopathic neuropathy, unspecified: Secondary | ICD-10-CM

## 2016-08-04 DIAGNOSIS — E119 Type 2 diabetes mellitus without complications: Secondary | ICD-10-CM | POA: Diagnosis not present

## 2016-08-04 DIAGNOSIS — I10 Essential (primary) hypertension: Secondary | ICD-10-CM

## 2016-08-04 LAB — POCT GLYCOSYLATED HEMOGLOBIN (HGB A1C): HEMOGLOBIN A1C: 6.4

## 2016-08-04 MED ORDER — BLOOD GLUCOSE MONITOR KIT: PACK | 99 refills | Status: DC

## 2016-08-04 MED FILL — LEVOTHYROXINE 137 MCG TABLE: 137 | 30 days supply | Qty: 30 | Fill #0

## 2016-08-04 NOTE — Patient Instructions (Signed)
Increase metformin to 1.5 tabs twice a day.

## 2016-08-04 NOTE — Progress Notes (Signed)
Subjective:    CC: HTN, DM  HPI:  Diabetes - no hypoglycemic events. No wounds or sores that are not healing well. No increased thirst or urination. Checking glucose at home. Taking medications as prescribed without any side effects.  Hypertension- Pt denies chest pain, SOB, dizziness, or heart palpitations.  Taking meds as directed w/o problems.  Denies medication side effects.    She still continues to struggle with numbness and pain in both feet. She really only gets relief when she takes her feet out of her shoe wear. They will feel hot and burning on the bottom. She did go see neurology and they did a pretty extensive workup and diagnosed her with etiopathic peripheral neuropathy. She is diabetic but has been very well controlled over the years.   Past medical history, Surgical history, Family history not pertinant except as noted below, Social history, Allergies, and medications have been entered into the medical record, reviewed, and corrections made.   Review of Systems: No fevers, chills, night sweats, weight loss, chest pain, or shortness of breath.   Objective:    General: Well Developed, well nourished, and in no acute distress.  Neuro: Alert and oriented x3, extra-ocular muscles intact, sensation grossly intact.  HEENT: Normocephalic, atraumatic  Skin: Warm and dry, no rashes. Cardiac: Regular rate and rhythm, no murmurs rubs or gallops, no lower extremity edema.  Respiratory: Clear to auscultation bilaterally. Not using accessory muscles, speaking in full sentences. MSK: tender along the ball of the foot.  No lesion or rash.    Impression and Recommendations:   HTN  - Well controlled. Continue current regimen. Follow up in  3-4 mo.   DM- well controlled overall but A1C is up to 6.4.  Inc metformin to 1000mg  BID. F/U in 3 months.    Peripheral neuropathy-I did offer to potentially refer her to podiatry. She could also have some muscle skeletal issues with her feet that  could be contributing to some of her discomfort.  Though I do think she has true peripheral neuropathy. I did give her a set of medial metatarsal pads to try in her shoes. This is helpful then she can go on to the website and order these on her own from [facility].    Will get flu shot through work.

## 2016-08-05 MED FILL — TRUEplus LANCETS 30G MISC: 90 days supply | Qty: 100 | Fill #0

## 2016-08-05 MED FILL — TRUE METRIX GLUCOSE TEST ST: 90 days supply | Qty: 100 | Fill #0

## 2016-08-06 ENCOUNTER — Other Ambulatory Visit (HOSPITAL_COMMUNITY)
Admission: RE | Admit: 2016-08-06 | Discharge: 2016-08-06 | Disposition: A | Discharge status: Home / Self Care | Payer: 59 | Source: Ambulatory Visit | Attending: Obstetrics & Gynecology | Admitting: Obstetrics & Gynecology

## 2016-08-06 DIAGNOSIS — E118 Type 2 diabetes mellitus with unspecified complications: Secondary | ICD-10-CM | POA: Diagnosis not present

## 2016-08-06 DIAGNOSIS — E669 Obesity, unspecified: Secondary | ICD-10-CM | POA: Insufficient documentation

## 2016-08-06 LAB — BASIC METABOLIC PANEL
Anion gap: 10 (ref 5–15)
BUN: 21 mg/dL — ABNORMAL HIGH (ref 6–20)
CO2: 29 mmol/L (ref 22–32)
Calcium: 9.7 mg/dL (ref 8.9–10.3)
Chloride: 99 mmol/L — ABNORMAL LOW (ref 101–111)
Creatinine, Ser: 0.95 mg/dL (ref 0.44–1.00)
GFR calc Af Amer: >60 mL/min (ref >60)
GFR calc non Af Amer: >60 mL/min (ref >60)
Glucose, Bld: 113 mg/dL — ABNORMAL HIGH (ref 65–99)
Potassium: 3.5 mmol/L (ref 3.5–5.1)
Sodium: 138 mmol/L (ref 135–145)

## 2016-08-24 ENCOUNTER — Other Ambulatory Visit: Payer: Self-pay | Admitting: Family Medicine

## 2016-08-24 MED FILL — PANTOPRAZOLE SOD DR 40 MG T: 40 | 90 days supply | Qty: 90 | Fill #1

## 2016-08-24 MED FILL — LOSARTAN POTASSIUM 100 MG T: 100 | 90 days supply | Qty: 90 | Fill #0

## 2016-08-31 DIAGNOSIS — E119 Type 2 diabetes mellitus without complications: Secondary | ICD-10-CM | POA: Diagnosis not present

## 2016-08-31 DIAGNOSIS — H5213 Myopia, bilateral: Secondary | ICD-10-CM | POA: Diagnosis not present

## 2016-08-31 DIAGNOSIS — Z135 Encounter for screening for eye and ear disorders: Secondary | ICD-10-CM | POA: Diagnosis not present

## 2016-08-31 LAB — HM DIABETES EYE EXAM

## 2016-09-07 ENCOUNTER — Other Ambulatory Visit: Payer: Self-pay | Admitting: Family Medicine

## 2016-09-07 MED FILL — CHLORTHALIDONE 50 MG TABLET: 50 | 90 days supply | Qty: 180 | Fill #2

## 2016-09-07 MED FILL — MELOXICAM 15 MG TABLET: 15 | 90 days supply | Qty: 90 | Fill #1

## 2016-09-10 ENCOUNTER — Encounter: Payer: Self-pay | Admitting: Family Medicine

## 2016-09-10 ENCOUNTER — Telehealth: Payer: Self-pay | Admitting: *Deleted

## 2016-09-10 DIAGNOSIS — E785 Hyperlipidemia, unspecified: Secondary | ICD-10-CM

## 2016-09-10 DIAGNOSIS — E038 Other specified hypothyroidism: Secondary | ICD-10-CM

## 2016-09-10 DIAGNOSIS — I10 Essential (primary) hypertension: Secondary | ICD-10-CM

## 2016-09-10 MED FILL — LEVOTHYROXINE 137 MCG TABLE: 137 | 90 days supply | Qty: 90 | Fill #0

## 2016-09-10 MED FILL — ATORVASTATIN 40 MG TABLET: 40 | 90 days supply | Qty: 90 | Fill #0

## 2016-09-10 NOTE — Telephone Encounter (Signed)
Pt called and informed that she will need updated labs. Will fax to [phone].Alexis Mcneil, Alexis Mcneil'

## 2016-09-11 MED ORDER — COLCHICINE 0.6 MG PO TABS: 0.6000 mg | ORAL_TABLET | Freq: Every day | ORAL | 1 refills | Status: DC

## 2016-09-11 MED ORDER — ALLOPURINOL 100 MG PO TABS: 100.0000 mg | ORAL_TABLET | Freq: Every day | ORAL | 1 refills | Status: DC

## 2016-09-11 MED FILL — ALLOPURINOL 100 MG TABLET: 100 | 90 days supply | Qty: 90 | Fill #0

## 2016-09-11 NOTE — Telephone Encounter (Signed)
Yes, Ok for lipid, CMP, and TSH. I'm not sure which medicine she is talking about either. May be gabapentin for the neuropathy. Just seeing what she says and let me know and I can always start her on it if she would like.

## 2016-09-11 NOTE — Addendum Note (Signed)
Addended by: Narda Rutherford on: 09/11/2016 03:45 PM   Modules accepted: Orders

## 2016-09-11 NOTE — Telephone Encounter (Signed)
Call patient: I sent over a prescription for allopurinol. She will also have to take colchicine once a day with it for the first 6 months. After that she will come off the colchicine and just continue the allopurinol by itself.

## 2016-09-14 ENCOUNTER — Inpatient Hospital Stay (HOSPITAL_COMMUNITY): Admit: 2016-09-14 | Payer: Self-pay

## 2016-09-14 ENCOUNTER — Other Ambulatory Visit (HOSPITAL_COMMUNITY)
Admission: RE | Admit: 2016-09-14 | Discharge: 2016-09-14 | Disposition: A | Discharge status: Home / Self Care | Payer: 59 | Source: Ambulatory Visit | Attending: Family Medicine | Admitting: Family Medicine

## 2016-09-14 DIAGNOSIS — E038 Other specified hypothyroidism: Secondary | ICD-10-CM | POA: Diagnosis not present

## 2016-09-14 DIAGNOSIS — E785 Hyperlipidemia, unspecified: Secondary | ICD-10-CM | POA: Insufficient documentation

## 2016-09-14 LAB — LIPID PANEL
Cholesterol: 171 mg/dL (ref 0–200)
HDL: 44 mg/dL (ref >40)
LDL CALC: 74 mg/dL (ref 0–99)
Total CHOL/HDL Ratio: 3.9 RATIO
Triglycerides: 267 mg/dL — ABNORMAL HIGH (ref <150)
VLDL: 53 mg/dL — ABNORMAL HIGH (ref 0–40)

## 2016-09-14 LAB — URIC ACID: Uric Acid, Serum: 7.3 mg/dL — ABNORMAL HIGH (ref 2.3–6.6)

## 2016-09-14 LAB — TSH: TSH: 1.66 u[IU]/mL (ref 0.350–4.500)

## 2016-09-29 ENCOUNTER — Other Ambulatory Visit: Payer: Self-pay | Admitting: Family Medicine

## 2016-09-30 ENCOUNTER — Other Ambulatory Visit: Payer: Self-pay | Admitting: Family Medicine

## 2016-10-02 MED FILL — metFORMIN HCL 500 MG TABS: 500 | 90 days supply | Qty: 180 | Fill #0

## 2016-11-05 ENCOUNTER — Encounter: Payer: Self-pay | Admitting: Family Medicine

## 2016-11-05 ENCOUNTER — Ambulatory Visit (INDEPENDENT_AMBULATORY_CARE_PROVIDER_SITE_OTHER): Payer: 59 | Admitting: Family Medicine

## 2016-11-05 VITALS — BP 132/68 | HR 81 | Ht 66.0 in | Wt 219.0 lb

## 2016-11-05 DIAGNOSIS — I10 Essential (primary) hypertension: Secondary | ICD-10-CM

## 2016-11-05 DIAGNOSIS — G609 Hereditary and idiopathic neuropathy, unspecified: Secondary | ICD-10-CM | POA: Diagnosis not present

## 2016-11-05 DIAGNOSIS — E669 Obesity, unspecified: Secondary | ICD-10-CM

## 2016-11-05 DIAGNOSIS — E1169 Type 2 diabetes mellitus with other specified complication: Secondary | ICD-10-CM

## 2016-11-05 LAB — POCT GLYCOSYLATED HEMOGLOBIN (HGB A1C): HEMOGLOBIN A1C: 6.2

## 2016-11-05 MED ORDER — METFORMIN HCL 500 MG PO TABS: ORAL_TABLET | ORAL | 2 refills | Status: DC

## 2016-11-05 NOTE — Progress Notes (Signed)
Subjective:    CC: HTN  HPI: Hypertension- Pt denies chest pain, SOB, dizziness, or heart palpitations.  Taking meds as directed w/o problems.  Denies medication side effects.    Diabetes - no hypoglycemic events. No wounds or sores that are not healing well. No increased thirst or urination. Checking glucose at home. Taking medications as prescribed without any side effects. Lab Results  Component Value Date   HGBA1C 6.2 11/05/2016    Neuropathy-she is still having the same burning and numbness in her feet. She tried gabapentin and had side effects. She wants to know what her other options are.  Elevated uric acid-she did start the allopurinol. They're still some debate about whether or not she may or may not have actual gallop she has not had a flare but does occasionally get red and swollen joints but they really only bothered her for about a day or 2 particularly in her great toe. She did not start the colchicine but says she has not had any flares with just being on the allopurinol by itself.  Past medical history, Surgical history, Family history not pertinant except as noted below, Social history, Allergies, and medications have been entered into the medical record, reviewed, and corrections made.   Review of Systems: No fevers, chills, night sweats, weight loss, chest pain, or shortness of breath.   Objective:    General: Well Developed, well nourished, and in no acute distress.  Neuro: Alert and oriented x3, extra-ocular muscles intact, sensation grossly intact.  HEENT: Normocephalic, atraumatic  Skin: Warm and dry, no rashes. Cardiac: Regular rate and rhythm, no murmurs rubs or gallops, no lower extremity edema.  Respiratory: Clear to auscultation bilaterally. Not using accessory muscles, speaking in full sentences.   Impression and Recommendations:   Hypertension-well-controlled. Continue current regimen.  Diabetes-A1c looks fantastic today. She is now taking 1-1/2 tabs of  the saphenofemoral milligram metformin twice a day and has been tolerating that well. Continue work on [employer] and regular exercise.  Neuropathy-discussed with her that this point we are still not a Hunter percent sure what may have been the cause of her neuropathy. Certainly it could be her diabetes that she's never been poorly controlled or it may just be etiopathic. She has had a normal nerve conduction study. She did not tolerate gabapentin well but certainly there are other medications on the market including Lyrica and Cymbalta and TCAs which could be used. I explained that none of these will slow the progression of the neuropathy and none of these will reverse the neuropathy. Most import thing is to control her diabetes and these medications can offer some reduction in pain but not complete resolution of pain. It certainly a threshold level for her day-to-day living to decide whether or not she wants to take medication to control it. She says for now she will hold off.

## 2016-11-10 ENCOUNTER — Encounter: Payer: Self-pay | Admitting: Family Medicine

## 2016-11-11 ENCOUNTER — Other Ambulatory Visit: Payer: Self-pay | Admitting: *Deleted

## 2016-11-11 MED ORDER — METFORMIN HCL 500 MG PO TABS: ORAL_TABLET | ORAL | 2 refills | Status: DC

## 2016-11-23 ENCOUNTER — Other Ambulatory Visit: Payer: Self-pay | Admitting: Family Medicine

## 2016-11-23 MED FILL — PANTOPRAZOLE SOD DR 40 MG T: 40 | 90 days supply | Qty: 90 | Fill #2

## 2016-11-24 ENCOUNTER — Encounter: Payer: Self-pay | Admitting: Family Medicine

## 2016-11-24 MED FILL — metFORMIN HCL 500 MG TABS: 500 | 90 days supply | Qty: 270 | Fill #0

## 2016-11-24 MED FILL — LOSARTAN POTASSIUM 100 MG T: 100 | 90 days supply | Qty: 90 | Fill #0

## 2016-11-25 ENCOUNTER — Ambulatory Visit: Payer: 59 | Admitting: Family Medicine

## 2016-11-30 ENCOUNTER — Other Ambulatory Visit: Payer: Self-pay | Admitting: Sports Medicine

## 2016-11-30 MED FILL — IBUPROFEN 800 MG TABLET: 800 | 20 days supply | Qty: 60 | Fill #0

## 2016-12-08 ENCOUNTER — Encounter: Payer: Self-pay | Admitting: Family Medicine

## 2016-12-08 ENCOUNTER — Ambulatory Visit (INDEPENDENT_AMBULATORY_CARE_PROVIDER_SITE_OTHER): Payer: 59 | Admitting: Family Medicine

## 2016-12-08 VITALS — BP 139/69 | HR 88 | Ht 66.0 in | Wt 217.0 lb

## 2016-12-08 DIAGNOSIS — G4733 Obstructive sleep apnea (adult) (pediatric): Secondary | ICD-10-CM

## 2016-12-08 MED ORDER — AMBULATORY NON FORMULARY MEDICATION: 0 refills | Status: AC

## 2016-12-08 NOTE — Progress Notes (Signed)
Subjective:    Patient ID: Alexis Mcneil, female    DOB: 08-22-1956, 61 y.o.   MRN: [id number]  HPI 14-year-old female with a diagnosis of hypertension diabetes and sleep apnea comes in today to discuss her sleep apnea. Her current CPAP machine is no longer working. She was diagnosed since 2011. Her AHI at that time was 45.7 consistent with moderately severe sleep apnea. She was using her CPAP at 9 cm of water pressure.She uses it almost every night and less she has other symptoms like nasal congestion etc. She typically gets about 7 hours of sleep most nights and wears it for the entire time. Her machine is humidified. She does feel like her CPAP improves her symptoms. She denies any daytime somnolence, excess fatigue or frequent headaches.   Review of Systems   BP 139/69   Pulse 88   Ht '5\' 6"'  (1.676 m)   Wt 217 lb (98.4 kg)   SpO2 98%   BMI 35.02 kg/m     Allergies  Allergen Reactions  . Niacin And Related Nausea And Vomiting    Passed out and threw up  . Gabapentin Other (See Comments)    Vision changes    Past Medical History:  Diagnosis Date  . Anemia   . Diabetes mellitus without complication ([place])   . GERD (gastroesophageal reflux disease)   . Heart murmur   . Hyperlipidemia   . Hypertension   . IFG (impaired fasting glucose)   . OSA (obstructive sleep apnea)    CPAP  . Thyroid disease     Past Surgical History:  Procedure Laterality Date  . endometrial polyp     January 2014  . mouth palate surgery repair  1961   large tear  . rt knee surgery arthroscopy  2009   torn meniscus/medial  . torn ligament shoulder  1975   repaired    Social History   Social History  . Marital status: Single    Spouse name: N/A  . Number of children: N/A  . Years of education: N/A   Occupational History  . Not on file.   Social History Main Topics  . Smoking status: Never Smoker  . Smokeless tobacco: Never Used  . Alcohol use 0.0 oz/week     Comment: occasional  beer on the weekends.    . Drug use: No  . Sexual activity: Yes    Birth control/ protection: Post-menopausal   Other Topics Concern  . Not on file   Social History Narrative   Lives alone in a 2 story home.  Has no children.     Works for [employer].     Education: associates degree.    Family History  Problem Relation Age of Onset  . Colon cancer Father 77    colon/ polyps  . Heart attack Father   . Stroke Father   . Hyperlipidemia Mother     Living  . Hypertension Mother   . Colon cancer Paternal Grandmother   . Stroke Paternal Grandmother   . Heart attack Paternal Grandmother     heart attack  . Diabetes      grandmother  . Supraventricular tachycardia Sister   . Esophageal cancer Neg Hx   . Stomach cancer Neg Hx   . Rectal cancer Neg Hx     Outpatient Encounter Prescriptions as of 12/08/2016  Medication Sig  . allopurinol (ZYLOPRIM) 100 MG tablet Take 1 tablet (100 mg total) by mouth daily.  Marland Kitchen  atorvastatin (LIPITOR) 40 MG tablet Take 1 tablet (40 mg total) by mouth daily at 6 PM.  . blood glucose meter kit and supplies KIT Dispense based on patient and insurance preference. Use up to once daily as directed. (FOR ICD-9 250.00, 250.01).  . chlorthalidone (HYGROTON) 50 MG tablet Take 2 tablets (100 mg total) by mouth daily.  . Diclofenac Sodium 2 % SOLN Place 2 sprays onto the skin 2 (two) times daily.  Marland Kitchen ibuprofen (ADVIL,MOTRIN) 800 MG tablet TAKE 1 TABLET BY MOUTH 3 TIMES DAILY AS NEEDED  . levothyroxine (SYNTHROID, LEVOTHROID) 137 MCG tablet Take 1 tablet (137 mcg total) by mouth daily before breakfast.  . lidocaine (XYLOCAINE) 5 % ointment Apply to feet twice daily as needed  . losartan (COZAAR) 100 MG tablet TAKE 1 TABLET BY MOUTH ONCE DAILY  . meloxicam (MOBIC) 15 MG tablet TAKE 1 TABLET BY MOUTH ONCE DAILY  . metFORMIN (GLUCOPHAGE) 500 MG tablet Take 1 1/2 tablets by mouth twice a day with meals  . pantoprazole (PROTONIX) 40 MG tablet TAKE 1 TABLET BY  MOUTH ONCE DAILY.  Marland Kitchen AMBULATORY NON FORMULARY MEDICATION Medication Name: CPAP machine and supplies with humidifier.  Please set her CPAP to 9 cm water pressure. Then download after one week so can review her apneas on 9 cm water pressure.  [facility]. Dx. OSA G47.33  . colchicine 0.6 MG tablet Take 1 tablet (0.6 mg total) by mouth daily. (Patient not taking: Reported on 11/05/2016)  . [DISCONTINUED] AMBULATORY NON FORMULARY MEDICATION Medication Name: Please set her CPAP to 9 cm water pressure.  Then download after one week so can review her apneas on 9 cm water pressure.   No facility-administered encounter medications on file as of 12/08/2016.           Objective:   Physical Exam  Constitutional: She is oriented to person, place, and time. She appears well-developed and well-nourished.  HENT:  Head: Normocephalic and atraumatic.  Cardiovascular: Normal rate, regular rhythm and normal heart sounds.   Pulmonary/Chest: Effort normal and breath sounds normal.  Neurological: She is alert and oriented to person, place, and time.  Skin: Skin is warm and dry.  Psychiatric: She has a normal mood and affect. Her behavior is normal.       Assessment & Plan:  Obstructive sleep apnea-we will place request for new CPAP machine with humidifier and new tubing, Etc. We'll set to 9 cm of water pressure. She is very compliant with her CPAP and will do well. Based on her AHI score at diagnosis CPAP is essential to her overall health.  Beatrice Lecher, MD

## 2016-12-09 ENCOUNTER — Other Ambulatory Visit: Payer: Self-pay | Admitting: Family Medicine

## 2016-12-09 MED FILL — LEVOTHYROXINE 137 MCG TABLE: 137 | 90 days supply | Qty: 90 | Fill #1

## 2016-12-09 MED FILL — MELOXICAM 15 MG TABLET: 15 | 90 days supply | Qty: 90 | Fill #0

## 2016-12-09 MED FILL — ALLOPURINOL 100 MG TABLET: 100 | 90 days supply | Qty: 90 | Fill #1

## 2016-12-09 MED FILL — ATORVASTATIN 40 MG TABLET: 40 | 90 days supply | Qty: 90 | Fill #1

## 2016-12-11 ENCOUNTER — Other Ambulatory Visit: Payer: Self-pay | Admitting: *Deleted

## 2016-12-11 DIAGNOSIS — E669 Obesity, unspecified: Principal | ICD-10-CM

## 2016-12-11 DIAGNOSIS — E1169 Type 2 diabetes mellitus with other specified complication: Secondary | ICD-10-CM

## 2016-12-11 MED ORDER — BLOOD GLUCOSE MONITOR KIT: PACK | 99 refills | Status: DC

## 2016-12-22 DIAGNOSIS — G4733 Obstructive sleep apnea (adult) (pediatric): Secondary | ICD-10-CM | POA: Diagnosis not present

## 2016-12-30 ENCOUNTER — Telehealth: Payer: Self-pay | Admitting: Family Medicine

## 2016-12-30 NOTE — Telephone Encounter (Signed)
Call pt: got her compliance report for her CPAP.  Looks [place]!!  AHI of 0.8.   Beatrice Lecher, MD

## 2016-12-31 NOTE — Telephone Encounter (Signed)
Pt informed.Shantaya Bluestone Lynetta  

## 2017-01-04 MED FILL — CHLORTHALIDONE 50 MG TABLET: 50 | 90 days supply | Qty: 180 | Fill #3

## 2017-01-19 DIAGNOSIS — G4733 Obstructive sleep apnea (adult) (pediatric): Secondary | ICD-10-CM | POA: Diagnosis not present

## 2017-02-08 ENCOUNTER — Ambulatory Visit: Payer: 59 | Admitting: Family Medicine

## 2017-02-10 DIAGNOSIS — G4733 Obstructive sleep apnea (adult) (pediatric): Secondary | ICD-10-CM | POA: Diagnosis not present

## 2017-02-15 ENCOUNTER — Encounter: Payer: Self-pay | Admitting: Family Medicine

## 2017-02-15 ENCOUNTER — Ambulatory Visit (INDEPENDENT_AMBULATORY_CARE_PROVIDER_SITE_OTHER): Payer: 59 | Admitting: Family Medicine

## 2017-02-15 ENCOUNTER — Telehealth: Payer: Self-pay | Admitting: Family Medicine

## 2017-02-15 VITALS — BP 122/62 | HR 78 | Wt 219.0 lb

## 2017-02-15 DIAGNOSIS — E790 Hyperuricemia without signs of inflammatory arthritis and tophaceous disease: Secondary | ICD-10-CM

## 2017-02-15 DIAGNOSIS — E669 Obesity, unspecified: Secondary | ICD-10-CM

## 2017-02-15 DIAGNOSIS — G609 Hereditary and idiopathic neuropathy, unspecified: Secondary | ICD-10-CM

## 2017-02-15 DIAGNOSIS — I10 Essential (primary) hypertension: Secondary | ICD-10-CM

## 2017-02-15 DIAGNOSIS — E1169 Type 2 diabetes mellitus with other specified complication: Secondary | ICD-10-CM

## 2017-02-15 DIAGNOSIS — E038 Other specified hypothyroidism: Secondary | ICD-10-CM

## 2017-02-15 LAB — POCT GLYCOSYLATED HEMOGLOBIN (HGB A1C): HEMOGLOBIN A1C: 6.4

## 2017-02-15 MED ORDER — PREGABALIN 50 MG PO CAPS: ORAL_CAPSULE | ORAL | 0 refills | Status: DC

## 2017-02-15 NOTE — Progress Notes (Signed)
Subjective:    CC: DM, HTN   HPI:  Hypertension- Pt denies chest pain, SOB, dizziness, or heart palpitations.  Taking meds as directed w/o problems.  Denies medication side effects.    Diabetes - no hypoglycemic events. No wounds or sores that are not healing well. No increased thirst or urination. Checking glucose at home. Taking medications as prescribed without any side effects.  She still continues to have a lot of pain from her peripheral neuropathy. She's had extensive workup. She is also taking her allopurinol to reduce her uric acid levels but really has not had any joint pain swelling or flares.  Past medical history, Surgical history, Family history not pertinant except as noted below, Social history, Allergies, and medications have been entered into the medical record, reviewed, and corrections made.   Review of Systems: No fevers, chills, night sweats, weight loss, chest pain, or shortness of breath.   Objective:    General: Well Developed, well nourished, and in no acute distress.  Neuro: Alert and oriented x3, extra-ocular muscles intact, sensation grossly intact.  HEENT: Normocephalic, atraumatic  Skin: Warm and dry, no rashes. Cardiac: Regular rate and rhythm, no murmurs rubs or gallops, no lower extremity edema.  Respiratory: Clear to auscultation bilaterally. Not using accessory muscles, speaking in full sentences.   Impression and Recommendations:    HTN  - Well controlled. Continue current regimen. Follow up in  3-4 months.   DM- Well controlled. Continue current regimen. Follow up in  3-4 months.  Lab Results  Component Value Date   HGBA1C 6.4 02/15/2017   Elevated uric acid level-due to recheck today. Next  Peripheral neuropathy- idiopathic. Discussed options for treatment. She said she's at the point now where it really is bothering her every day almost all day and she is the point of wanting to try something. We discussed options. She had tried gabapentin  in the past but it caused blurry vision. We will try Lyrica instead.

## 2017-02-15 NOTE — Telephone Encounter (Signed)
Please call [facility] on [facility] for last eye exam.   Beatrice Lecher, MD

## 2017-02-15 NOTE — Telephone Encounter (Signed)
Placed in your box 

## 2017-02-15 NOTE — Telephone Encounter (Signed)
Spoke with [place], she said she would fax them over.

## 2017-02-16 MED ORDER — ACCU-CHEK FASTCLIX LANCET KIT: PACK | 1 refills | Status: AC

## 2017-02-16 MED ORDER — ACCU-CHEK AVIVA PLUS W/DEVICE KIT: PACK | 0 refills | Status: DC

## 2017-02-16 MED ORDER — GLUCOSE BLOOD VI STRP: ORAL_STRIP | 12 refills | Status: DC

## 2017-02-16 MED FILL — ACCU-CHEK GUIDE TEST STRIP: 90 days supply | Qty: 100 | Fill #0

## 2017-02-16 MED FILL — ACCU-CHEK FASTCLIX LANCETS: 90 days supply | Qty: 102 | Fill #0

## 2017-02-16 MED FILL — PANTOPRAZOLE SOD DR 40 MG T: 40 | 90 days supply | Qty: 90 | Fill #3

## 2017-02-16 MED FILL — LYRICA 50 MG CAPSULE: 50 | 30 days supply | Qty: 60 | Fill #0

## 2017-02-16 MED FILL — LOSARTAN POTASSIUM 100 MG T: 100 | 90 days supply | Qty: 90 | Fill #1

## 2017-02-17 ENCOUNTER — Other Ambulatory Visit (HOSPITAL_COMMUNITY)
Admission: RE | Admit: 2017-02-17 | Discharge: 2017-02-17 | Disposition: A | Discharge status: Home / Self Care | Payer: 59 | Source: Ambulatory Visit | Attending: Family Medicine | Admitting: Family Medicine

## 2017-02-17 DIAGNOSIS — E038 Other specified hypothyroidism: Secondary | ICD-10-CM | POA: Diagnosis not present

## 2017-02-17 DIAGNOSIS — E1169 Type 2 diabetes mellitus with other specified complication: Secondary | ICD-10-CM | POA: Diagnosis not present

## 2017-02-17 DIAGNOSIS — E790 Hyperuricemia without signs of inflammatory arthritis and tophaceous disease: Secondary | ICD-10-CM | POA: Diagnosis not present

## 2017-02-17 DIAGNOSIS — E669 Obesity, unspecified: Secondary | ICD-10-CM | POA: Insufficient documentation

## 2017-02-17 DIAGNOSIS — I10 Essential (primary) hypertension: Secondary | ICD-10-CM | POA: Diagnosis not present

## 2017-02-17 LAB — COMPREHENSIVE METABOLIC PANEL
ALT: 22 U/L (ref 14–54)
AST: 19 U/L (ref 15–41)
Albumin: 4 g/dL (ref 3.5–5.0)
Alkaline Phosphatase: 69 U/L (ref 38–126)
Anion gap: 9 (ref 5–15)
BUN: 26 mg/dL — AB (ref 6–20)
CO2: 31 mmol/L (ref 22–32)
CREATININE: 0.99 mg/dL (ref 0.44–1.00)
Calcium: 9.4 mg/dL (ref 8.9–10.3)
Chloride: 102 mmol/L (ref 101–111)
GFR calc Af Amer: >60 mL/min (ref >60)
GFR calc non Af Amer: >60 mL/min (ref >60)
GLUCOSE: 138 mg/dL — AB (ref 65–99)
Potassium: 3.3 mmol/L — ABNORMAL LOW (ref 3.5–5.1)
Sodium: 142 mmol/L (ref 135–145)
Total Bilirubin: 0.9 mg/dL (ref 0.3–1.2)
Total Protein: 7 g/dL (ref 6.5–8.1)

## 2017-02-17 LAB — TSH: TSH: 0.992 u[IU]/mL (ref 0.350–4.500)

## 2017-02-17 LAB — URIC ACID: Uric Acid, Serum: 6.9 mg/dL — ABNORMAL HIGH (ref 2.3–6.6)

## 2017-02-19 ENCOUNTER — Encounter: Payer: Self-pay | Admitting: Family Medicine

## 2017-02-19 DIAGNOSIS — G4733 Obstructive sleep apnea (adult) (pediatric): Secondary | ICD-10-CM | POA: Diagnosis not present

## 2017-02-24 MED FILL — metFORMIN HCL 500 MG TABS: 500 | 90 days supply | Qty: 270 | Fill #1

## 2017-03-08 ENCOUNTER — Other Ambulatory Visit: Payer: Self-pay | Admitting: Family Medicine

## 2017-03-08 MED FILL — LEVOTHYROXINE 137 MCG TABLE: 137 | 90 days supply | Qty: 90 | Fill #0

## 2017-03-08 MED FILL — ALLOPURINOL 100 MG TABLET: 100 | 90 days supply | Qty: 90 | Fill #0

## 2017-03-08 MED FILL — MELOXICAM 15 MG TABLET: 15 | 90 days supply | Qty: 90 | Fill #1

## 2017-03-08 MED FILL — ATORVASTATIN 40 MG TABLET: 40 | 90 days supply | Qty: 90 | Fill #2

## 2017-03-16 ENCOUNTER — Ambulatory Visit: Payer: 59 | Admitting: Family Medicine

## 2017-03-21 DIAGNOSIS — G4733 Obstructive sleep apnea (adult) (pediatric): Secondary | ICD-10-CM | POA: Diagnosis not present

## 2017-03-25 ENCOUNTER — Other Ambulatory Visit: Payer: Self-pay | Admitting: *Deleted

## 2017-03-25 MED ORDER — PREGABALIN 50 MG PO CAPS: 50.0000 mg | ORAL_CAPSULE | Freq: Two times a day (BID) | ORAL | 1 refills | Status: DC

## 2017-03-25 MED FILL — LYRICA 50 MG CAPSULE: 50 | 90 days supply | Qty: 180 | Fill #0

## 2017-03-30 ENCOUNTER — Ambulatory Visit: Payer: 59 | Admitting: Family Medicine

## 2017-04-19 ENCOUNTER — Ambulatory Visit (INDEPENDENT_AMBULATORY_CARE_PROVIDER_SITE_OTHER): Payer: 59 | Admitting: Family Medicine

## 2017-04-19 ENCOUNTER — Encounter: Payer: Self-pay | Admitting: Family Medicine

## 2017-04-19 VITALS — BP 114/53 | HR 86 | Wt 222.0 lb

## 2017-04-19 DIAGNOSIS — E669 Obesity, unspecified: Secondary | ICD-10-CM | POA: Diagnosis not present

## 2017-04-19 DIAGNOSIS — G609 Hereditary and idiopathic neuropathy, unspecified: Secondary | ICD-10-CM

## 2017-04-19 DIAGNOSIS — E1169 Type 2 diabetes mellitus with other specified complication: Secondary | ICD-10-CM

## 2017-04-19 NOTE — Progress Notes (Signed)
   Subjective:    Patient ID: Alexis Mcneil, female    DOB: 1956/03/21, 61 y.o.   MRN: [id number]  HPI  55 female comes in today to follow-up on peripheral neuropathy. We recently started her on Lyrica.She says it has been helpful. There is also made her a little sleepy. She's now to 50 mg twice a day. She feels like it has improved her symptoms by about 20%. She's not noticing the discomfort at work nearly as much. But if she still does a lot of walking she'll still get a lot of pain and burning and redness in her feet. Particularly if she's wearing closed shoes. If she's wearing open shoes it actually is much better.  Diabetes-she is currently in the well [facility] program. They did send over her recent blood sugar levels that she's been recording. A lot of them have been in the 120s and 130s. Overall they look pretty good. There is a Foley some occasional spikes but she admits that she's also been stress eating. Her mother's dementia has been progressing and this is been very stressful for her as her mother is in complete denial that there is anything wrong with her.  Review of Systems     Objective:   Physical Exam  Constitutional: She is oriented to person, place, and time. She appears well-developed and well-nourished.  HENT:  Head: Normocephalic and atraumatic.  Eyes: Conjunctivae and EOM are normal.  Cardiovascular: Normal rate.   Pulmonary/Chest: Effort normal.  Neurological: She is alert and oriented to person, place, and time.  Skin: Skin is dry. No pallor.  Psychiatric: She has a normal mood and affect. Her behavior is normal.  Vitals reviewed.         Assessment & Plan:  Peripheral neuropathy-discussed options. Will increase Lyrica by an extra 50 mg per day. She can take the extra dose in the middle the day or take 100 mg at bedtime and 50 in the morning. She can try different ways and see which she prefers. I really like to get her to about a 30% improvement in  pain  Diabetes-just reminded her to stay on track with her diet. Stress eating is one of her biggest weaknesses. We discussed other strategies to help reduce stress. Also encouraged her to be as active as she can be there are no her neuropathy keeps her from exercising regularly. Keep appointment in 5 weeks to recheck hemoglobin A1c.

## 2017-04-22 ENCOUNTER — Other Ambulatory Visit: Payer: Self-pay | Admitting: Family Medicine

## 2017-04-22 MED FILL — IBUPROFEN 800 MG TAB: 800 | 20 days supply | Qty: 60 | Fill #1

## 2017-04-22 MED FILL — CHLORTHALIDONE 50 MG TABLET: 50 | 90 days supply | Qty: 180 | Fill #0

## 2017-05-05 ENCOUNTER — Other Ambulatory Visit: Payer: Self-pay | Admitting: Family Medicine

## 2017-05-05 MED FILL — ACCU-CHEK GUIDE TEST STRIP: 90 days supply | Qty: 100 | Fill #1

## 2017-05-06 DIAGNOSIS — G4733 Obstructive sleep apnea (adult) (pediatric): Secondary | ICD-10-CM | POA: Diagnosis not present

## 2017-05-14 MED FILL — LOSARTAN POTASSIUM 100 MG T: 100 | 90 days supply | Qty: 90 | Fill #0

## 2017-05-20 ENCOUNTER — Other Ambulatory Visit: Payer: Self-pay | Admitting: Family Medicine

## 2017-05-20 DIAGNOSIS — Z1231 Encounter for screening mammogram for malignant neoplasm of breast: Secondary | ICD-10-CM

## 2017-05-23 MED FILL — metFORMIN HCL 500 MG TABS: 500 | 90 days supply | Qty: 270 | Fill #2

## 2017-05-24 ENCOUNTER — Ambulatory Visit (INDEPENDENT_AMBULATORY_CARE_PROVIDER_SITE_OTHER): Payer: 59 | Admitting: Family Medicine

## 2017-05-24 ENCOUNTER — Other Ambulatory Visit: Payer: Self-pay | Admitting: Family Medicine

## 2017-05-24 ENCOUNTER — Encounter: Payer: Self-pay | Admitting: Family Medicine

## 2017-05-24 VITALS — BP 122/57 | HR 75 | Ht 65.5 in | Wt 222.0 lb

## 2017-05-24 DIAGNOSIS — G609 Hereditary and idiopathic neuropathy, unspecified: Secondary | ICD-10-CM | POA: Diagnosis not present

## 2017-05-24 DIAGNOSIS — E119 Type 2 diabetes mellitus without complications: Secondary | ICD-10-CM

## 2017-05-24 DIAGNOSIS — I10 Essential (primary) hypertension: Secondary | ICD-10-CM | POA: Diagnosis not present

## 2017-05-24 LAB — POCT GLYCOSYLATED HEMOGLOBIN (HGB A1C): Hemoglobin A1C: 6.1

## 2017-05-24 MED ORDER — AMITRIPTYLINE HCL 25 MG PO TABS: 12.5000 mg | ORAL_TABLET | Freq: Every day | ORAL | 0 refills | Status: DC

## 2017-05-24 MED FILL — PANTOPRAZOLE SOD DR 40 MG T: 40 | 90 days supply | Qty: 90 | Fill #0

## 2017-05-24 MED FILL — AMITRIPTYLINE HCL 25 MG TAB: 25 | 30 days supply | Qty: 60 | Fill #0

## 2017-05-24 NOTE — Patient Instructions (Addendum)
Decrease Lyrica down to 1 pill daily for 2-3 days and then stop completely. After about 3 days off of medication can start the amitriptyline.

## 2017-05-24 NOTE — Progress Notes (Signed)
Subjective:    CC: DM, neuropathy  HPI: Diabetes - no hypoglycemic events. No wounds or sores that are not healing well. No increased thirst or urination. Checking glucose at home. Taking medications as prescribed without any side effects.  Hypertension- Pt denies chest pain, SOB, dizziness, or heart palpitations.  Taking meds as directed w/o problems.  Denies medication side effects.    Peripheral neuropathy-we increased her Lyrica an extra 50 mg a day. She says often forgets to take it at lunch.  Says not sure if it is really helping. She has gained a lot of weight on it.  She feels has felt excessively hungry as well as sleepy.    Past medical history, Surgical history, Family history not pertinant except as noted below, Social history, Allergies, and medications have been entered into the medical record, reviewed, and corrections made.   Review of Systems: No fevers, chills, night sweats, weight loss, chest pain, or shortness of breath.   Objective:    General: Well Developed, well nourished, and in no acute distress.  Neuro: Alert and oriented x3, extra-ocular muscles intact, sensation grossly intact.  HEENT: Normocephalic, atraumatic  Skin: Warm and dry, no rashes. Cardiac: Regular rate and rhythm, no murmurs rubs or gallops, no lower extremity edema.  Respiratory: Clear to auscultation bilaterally. Not using accessory muscles, speaking in full sentences.   Impression and Recommendations:    DM- A1C of 6.1.  Well controlled. Continue current regimen. Follow up in  4 months.   HTN - Well controlled. Continue current regimen. Follow up in  6 months.   Peripheral Neuropathy - discussed options.  Will wean the Lyrica. She may find that it has been working more than she thinks.  Will try amitriptyline. I did warn about the potential for weight gain with this as well but will certainly take a look at it. Start with half a tab gradually work up to 1 tab and then up to 2 tabs at bedtime  as tolerated.

## 2017-06-04 MED FILL — ATORVASTATIN 40 MG TABLET: 40 | 90 days supply | Qty: 90 | Fill #3

## 2017-06-04 MED FILL — LEVOTHYROXINE 137 MCG TABLE: 137 | 90 days supply | Qty: 90 | Fill #1

## 2017-06-04 MED FILL — ALLOPURINOL 100 MG TABLET: 100 | 90 days supply | Qty: 90 | Fill #1

## 2017-06-14 ENCOUNTER — Ambulatory Visit
Admission: RE | Admit: 2017-06-14 | Discharge: 2017-06-14 | Disposition: A | Discharge status: Home / Self Care | Payer: 59 | Source: Ambulatory Visit | Attending: Family Medicine | Admitting: Family Medicine

## 2017-06-14 DIAGNOSIS — Z1231 Encounter for screening mammogram for malignant neoplasm of breast: Secondary | ICD-10-CM | POA: Diagnosis not present

## 2017-06-24 ENCOUNTER — Encounter: Payer: Self-pay | Admitting: Family Medicine

## 2017-06-24 ENCOUNTER — Ambulatory Visit: Payer: 59 | Admitting: Family Medicine

## 2017-06-24 NOTE — Progress Notes (Deleted)
Subjective:    CC: Peripheral neuropathy  HPI:  Follow-up peripheral neuropathy-one-month follow-up. We discontinued her Lyrica and put her on amitriptyline.  Past medical history, Surgical history, Family history not pertinant except as noted below, Social history, Allergies, and medications have been entered into the medical record, reviewed, and corrections made.   Review of Systems: No fevers, chills, night sweats, weight loss, chest pain, or shortness of breath.   Objective:    General: Well Developed, well nourished, and in no acute distress.  Neuro: Alert and oriented x3, extra-ocular muscles intact, sensation grossly intact.  HEENT: Normocephalic, atraumatic  Skin: Warm and dry, no rashes. Cardiac: Regular rate and rhythm, no murmurs rubs or gallops, no lower extremity edema.  Respiratory: Clear to auscultation bilaterally. Not using accessory muscles, speaking in full sentences.   Impression and Recommendations:    Peripheral neuropathy, etiopathic-

## 2017-06-29 ENCOUNTER — Other Ambulatory Visit: Payer: Self-pay | Admitting: Family Medicine

## 2017-06-30 MED FILL — MELOXICAM 15 MG TABLET: 15 | 90 days supply | Qty: 90 | Fill #0

## 2017-07-21 ENCOUNTER — Encounter: Payer: Self-pay | Admitting: Family Medicine

## 2017-07-23 ENCOUNTER — Telehealth: Payer: Self-pay | Admitting: Family Medicine

## 2017-07-23 NOTE — Telephone Encounter (Signed)
Alexis Mcneil, Can we wave her late fee for last OV please.

## 2017-08-03 ENCOUNTER — Encounter: Payer: Self-pay | Admitting: Family Medicine

## 2017-08-03 ENCOUNTER — Ambulatory Visit (INDEPENDENT_AMBULATORY_CARE_PROVIDER_SITE_OTHER): Payer: 59 | Admitting: Family Medicine

## 2017-08-03 VITALS — BP 108/63 | HR 70 | Ht 66.0 in | Wt 218.0 lb

## 2017-08-03 DIAGNOSIS — Z6835 Body mass index (BMI) 35.0-35.9, adult: Secondary | ICD-10-CM

## 2017-08-03 DIAGNOSIS — G609 Hereditary and idiopathic neuropathy, unspecified: Secondary | ICD-10-CM

## 2017-08-03 MED ORDER — AMITRIPTYLINE HCL 25 MG PO TABS: 12.5000 mg | ORAL_TABLET | Freq: Every day | ORAL | 1 refills | Status: DC

## 2017-08-03 MED FILL — ACCU-CHEK GUIDE TEST STRIP: 90 days supply | Qty: 100 | Fill #2

## 2017-08-03 MED FILL — AMITRIPTYLINE HCL 25 MG TAB: 25 | 90 days supply | Qty: 180 | Fill #0

## 2017-08-03 NOTE — Progress Notes (Signed)
Subjective:    Patient ID: Alexis Mcneil, female    DOB: 1956/07/13, 61 y.o.   MRN: [id number]  HPI Here today to follow-up for peripheral neuropathy. Initially on Lyrica. She felt it was helping some but then the effect seem to wear off. She stopped the medication and didn't notice any difference.  She is now on amitriptyline at bedtime.She is up to 1-1/4 of a tab. When she takes 1-1/2 it's a little too sedating the next day. She's noticed that she's not having to kick off her shoes at work as much. She's been going to visit her mother every other weekend. Unfortunately her mother's dementia has been progressing. He is also off the fish oil as well.  She still waking up with the sensation that her feet feel swollen in the morning. They're typically they're not actually physically swollen they by the middle of the day they are she'll notice that she has to loosen her shoes.  Review of Systems   BP 108/63   Pulse 70   Ht '5\' 6"'$  (1.676 m)   Wt 218 lb (98.9 kg)   SpO2 99%   BMI 35.19 kg/m     Allergies  Allergen Reactions  . Niacin And Related Nausea And Vomiting    Passed out and threw up  . Gabapentin Other (See Comments)    Vision changes    Past Medical History:  Diagnosis Date  . Anemia   . Diabetes mellitus without complication ([place])   . GERD (gastroesophageal reflux disease)   . Heart murmur   . Hyperlipidemia   . Hypertension   . IFG (impaired fasting glucose)   . OSA (obstructive sleep apnea)    CPAP  . Thyroid disease     Past Surgical History:  Procedure Laterality Date  . endometrial polyp     January 2014  . mouth palate surgery repair  1961   large tear  . rt knee surgery arthroscopy  2009   torn meniscus/medial  . torn ligament shoulder  1975   repaired    Social History   Social History  . Marital status: Single    Spouse name: N/A  . Number of children: N/A  . Years of education: N/A   Occupational History  . Not on file.   Social  History Main Topics  . Smoking status: Never Smoker  . Smokeless tobacco: Never Used  . Alcohol use 0.0 oz/week     Comment: occasional beer on the weekends.    . Drug use: No  . Sexual activity: Yes    Birth control/ protection: Post-menopausal   Other Topics Concern  . Not on file   Social History Narrative   Lives alone in a 2 story home.  Has no children.     Works for [employer].     Education: associates degree.    Family History  Problem Relation Age of Onset  . Colon cancer Father 79       colon/ polyps  . Heart attack Father   . Stroke Father   . Hyperlipidemia Mother        Living  . Hypertension Mother   . Colon cancer Paternal Grandmother   . Stroke Paternal Grandmother   . Heart attack Paternal Grandmother        heart attack  . Diabetes Unknown        grandmother  . Supraventricular tachycardia Sister   . Esophageal cancer Neg Hx   .  Stomach cancer Neg Hx   . Rectal cancer Neg Hx     Outpatient Encounter Prescriptions as of 08/03/2017  Medication Sig  . allopurinol (ZYLOPRIM) 100 MG tablet TAKE 1 TABLET BY MOUTH ONCE DAILY  . AMBULATORY NON FORMULARY MEDICATION Medication Name: CPAP machine and supplies with humidifier.  Please set her CPAP to 9 cm water pressure. Then download after one week so can review her apneas on 9 cm water pressure.  [facility]. Dx. OSA G47.33  . amitriptyline (ELAVIL) 25 MG tablet Take 0.5-2 tablets (12.5-50 mg total) by mouth at bedtime.  Marland Kitchen atorvastatin (LIPITOR) 40 MG tablet Take 1 tablet (40 mg total) by mouth daily at 6 PM.  . Blood Glucose Monitoring Suppl (ACCU-CHEK AVIVA PLUS) w/Device KIT Use to check blood sugar once daily. Dx code: E11.69  . chlorthalidone (HYGROTON) 50 MG tablet TAKE 2 TABLETS BY MOUTH DAILY  . glucose blood (ACCU-CHEK AVIVA) test strip Use to check blood sugar once daily. Dx code: E11.69  . ibuprofen (ADVIL,MOTRIN) 800 MG tablet TAKE 1 TABLET BY MOUTH 3 TIMES DAILY AS NEEDED  . Lancets  Misc. (ACCU-CHEK FASTCLIX LANCET) KIT Use to check blood sugar once daily. Dx code: E11.69  . levothyroxine (SYNTHROID, LEVOTHROID) 137 MCG tablet TAKE 1 TABLET BY MOUTH ONCE DAILY BEFORE BREAKFAST  . losartan (COZAAR) 100 MG tablet TAKE 1 TABLET BY MOUTH ONCE DAILY  . meloxicam (MOBIC) 15 MG tablet TAKE 1 TABLET BY MOUTH ONCE DAILY  . metFORMIN (GLUCOPHAGE) 500 MG tablet Take 1 1/2 tablets by mouth twice a day with meals  . Multiple Vitamins-Minerals (MULTIVITAMIN ADULT PO) Take by mouth daily.  . pantoprazole (PROTONIX) 40 MG tablet TAKE 1 TABLET BY MOUTH ONCE DAILY.  . [DISCONTINUED] amitriptyline (ELAVIL) 25 MG tablet Take 0.5-2 tablets (12.5-50 mg total) by mouth at bedtime.  . [DISCONTINUED] lidocaine (XYLOCAINE) 5 % ointment Apply to feet twice daily as needed  . [DISCONTINUED] Omega-3 Fatty Acids (FISH OIL CONCENTRATE PO) Take by mouth 2 (two) times daily.  . [DISCONTINUED] pregabalin (LYRICA) 50 MG capsule Take 1 capsule (50 mg total) by mouth 2 (two) times daily.   No facility-administered encounter medications on file as of 08/03/2017.           Objective:   Physical Exam  Constitutional: She is oriented to person, place, and time. She appears well-developed and well-nourished.  HENT:  Head: Normocephalic and atraumatic.  Cardiovascular: Normal rate, regular rhythm and normal heart sounds.   Pulmonary/Chest: Effort normal and breath sounds normal.  Neurological: She is alert and oriented to person, place, and time.  Skin: Skin is warm and dry.  Psychiatric: She has a normal mood and affect. Her behavior is normal.          Assessment & Plan:  Peripheral neuropathy-I do feel like her symptoms are a little bit more manageable on the Amitriptyline compared to the Lyrica, so we'll continue with this for now. Follow-up in 4-6 months.  BMI 35-she's actually lost about 4 pounds since she was here in July which is fantastic.She says when she's not at home she tends to snack  less and does better with controlling her weight.  Time spent 20 minutes, greater than 50% of time counseling about neuropathy and healthy weight.

## 2017-08-06 NOTE — Telephone Encounter (Signed)
Pt informed.Alexis Mcneil Lynetta  

## 2017-08-06 NOTE — Telephone Encounter (Signed)
Please cal pt and let her know fee was waived.

## 2017-08-10 MED FILL — CHLORTHALIDONE 50 MG TABLET: 50 | 90 days supply | Qty: 180 | Fill #1

## 2017-08-11 MED FILL — LOSARTAN POTASSIUM 100 MG T: 100 | 90 days supply | Qty: 90 | Fill #1

## 2017-08-23 MED FILL — PANTOPRAZOLE SOD DR 40 MG T: 40 | 90 days supply | Qty: 90 | Fill #1

## 2017-08-24 ENCOUNTER — Other Ambulatory Visit: Payer: Self-pay | Admitting: Family Medicine

## 2017-08-24 MED FILL — metFORMIN HCL 500 MG TABS: 500 | 90 days supply | Qty: 270 | Fill #0

## 2017-09-07 ENCOUNTER — Other Ambulatory Visit: Payer: Self-pay | Admitting: Family Medicine

## 2017-09-07 MED FILL — ATORVASTATIN 40 MG TABLET: 40 | 30 days supply | Qty: 30 | Fill #0

## 2017-09-07 MED FILL — ALLOPURINOL 100 MG TABS: 100 | 30 days supply | Qty: 30 | Fill #0

## 2017-09-13 ENCOUNTER — Other Ambulatory Visit: Payer: Self-pay | Admitting: Family Medicine

## 2017-09-13 MED FILL — LEVOTHYROXINE 137 MCG TABLE: 137 | 90 days supply | Qty: 90 | Fill #0

## 2017-09-29 MED FILL — MELOXICAM 15 MG TABLET: 15 | 90 days supply | Qty: 90 | Fill #1

## 2017-10-04 ENCOUNTER — Ambulatory Visit: Payer: 59 | Admitting: Family Medicine

## 2017-10-04 NOTE — Progress Notes (Deleted)
   Subjective:    Patient ID: Alexis Mcneil, female    DOB: 1955/12/02, 61 y.o.   MRN: [id number]  HPI Hypertension- Pt denies chest pain, SOB, dizziness, or heart palpitations.  Taking meds as directed w/o problems.  Denies medication side effects.    DM-    Review of Systems     Objective:   Physical Exam  Constitutional: She is oriented to person, place, and time. She appears well-developed and well-nourished.  HENT:  Head: Normocephalic and atraumatic.  Cardiovascular: Normal rate, regular rhythm and normal heart sounds.  Pulmonary/Chest: Effort normal and breath sounds normal.  Neurological: She is alert and oriented to person, place, and time.  Skin: Skin is warm and dry.  Psychiatric: She has a normal mood and affect. Her behavior is normal.          Assessment & Plan:  HTN -   DM -  On statin and ARB.

## 2017-10-05 ENCOUNTER — Other Ambulatory Visit: Payer: Self-pay | Admitting: Family Medicine

## 2017-10-05 MED FILL — ATORVASTATIN 40 MG TABLET: 40 | 90 days supply | Qty: 90 | Fill #0

## 2017-10-05 MED FILL — ALLOPURINOL 100 MG TABS: 100 | 90 days supply | Qty: 90 | Fill #0

## 2017-10-15 MED FILL — IBUPROFEN 800 MG TAB: 800 | 20 days supply | Qty: 60 | Fill #2

## 2017-10-18 ENCOUNTER — Ambulatory Visit: Payer: 59 | Admitting: Family Medicine

## 2017-10-22 ENCOUNTER — Encounter: Payer: Self-pay | Admitting: Family Medicine

## 2017-10-22 ENCOUNTER — Ambulatory Visit (INDEPENDENT_AMBULATORY_CARE_PROVIDER_SITE_OTHER): Payer: 59 | Admitting: Family Medicine

## 2017-10-22 VITALS — BP 135/66 | HR 98 | Wt 214.0 lb

## 2017-10-22 DIAGNOSIS — E119 Type 2 diabetes mellitus without complications: Secondary | ICD-10-CM | POA: Diagnosis not present

## 2017-10-22 DIAGNOSIS — R682 Dry mouth, unspecified: Secondary | ICD-10-CM | POA: Diagnosis not present

## 2017-10-22 DIAGNOSIS — G4733 Obstructive sleep apnea (adult) (pediatric): Secondary | ICD-10-CM | POA: Diagnosis not present

## 2017-10-22 DIAGNOSIS — G609 Hereditary and idiopathic neuropathy, unspecified: Secondary | ICD-10-CM

## 2017-10-22 DIAGNOSIS — I10 Essential (primary) hypertension: Secondary | ICD-10-CM

## 2017-10-22 LAB — POCT GLYCOSYLATED HEMOGLOBIN (HGB A1C): Hemoglobin A1C: 6.2

## 2017-10-22 NOTE — Progress Notes (Signed)
Subjective:    CC: DM  HPI:  Diabetes - no hypoglycemic events. No wounds or sores that are not healing well. No increased thirst or urination. Checking glucose at home. Taking medications as prescribed without any side effects.  Hypertension- Pt denies chest pain, SOB, dizziness, or heart palpitations.  Taking meds as directed w/o problems.  Denies medication side effects.    Follow-up idiopathic peripheral neuropathy-she is now up to one half tabs on the amitriptyline and is doing fantastic with it really it has made a big difference.  She is also making sure to wear loosefitting socks which also seem to help.  She is actually been doing a little more walking than usual and has been able to tolerate it without significant pain or discomfort.  Obstructive of sleep apnea-she is been wearing her CPAP regularly.  She is been having a lot of excess dry mouth though since she stopped the amitriptyline.  She is been doing a Biotene rinse before she puts her CPAP on at night.  Past medical history, Surgical history, Family history not pertinant except as noted below, Social history, Allergies, and medications have been entered into the medical record, reviewed, and corrections made.   Review of Systems: No fevers, chills, night sweats, weight loss, chest pain, or shortness of breath.   Objective:    General: Well Developed, well nourished, and in no acute distress.  Neuro: Alert and oriented x3, extra-ocular muscles intact, sensation grossly intact.  HEENT: Normocephalic, atraumatic  Skin: Warm and dry, no rashes. Cardiac: Regular rate and rhythm, no murmurs rubs or gallops, no lower extremity edema.  Respiratory: Clear to auscultation bilaterally. Not using accessory muscles, speaking in full sentences.   Impression and Recommendations:    DM -we will control.  Heme globin A1c of 6.2 today which is fantastic.  Continue current regimen.  Follow-up in 4 months.  Due for regular CMP and lipid  panel.  HTN - Well controlled. Continue current regimen. Follow up in  4 months  Idiopathic peripheral neuropathy-doing well with amitriptyline.  Continue current regimen.  OSA -stable.  Wearing her CPAP consistently.  Dry mouth-likely side effect of amitriptyline as well as the chlorthalidone.  For now she wants to stick with her current regimen and will just work on trying to increase fluids

## 2017-10-28 ENCOUNTER — Other Ambulatory Visit: Payer: Self-pay | Admitting: Family Medicine

## 2017-10-28 ENCOUNTER — Other Ambulatory Visit (HOSPITAL_COMMUNITY)
Admission: RE | Admit: 2017-10-28 | Discharge: 2017-10-28 | Disposition: A | Discharge status: Home / Self Care | Payer: 59 | Source: Ambulatory Visit | Attending: Family Medicine | Admitting: Family Medicine

## 2017-10-28 DIAGNOSIS — E119 Type 2 diabetes mellitus without complications: Secondary | ICD-10-CM | POA: Insufficient documentation

## 2017-10-28 DIAGNOSIS — I10 Essential (primary) hypertension: Secondary | ICD-10-CM | POA: Insufficient documentation

## 2017-10-28 LAB — COMPREHENSIVE METABOLIC PANEL
ALBUMIN: 4 g/dL (ref 3.5–5.0)
ALT: 29 U/L (ref 14–54)
AST: 22 U/L (ref 15–41)
Alkaline Phosphatase: 75 U/L (ref 38–126)
Anion gap: 10 (ref 5–15)
BUN: 22 mg/dL — AB (ref 6–20)
CHLORIDE: 98 mmol/L — AB (ref 101–111)
CO2: 30 mmol/L (ref 22–32)
CREATININE: 1.05 mg/dL — AB (ref 0.44–1.00)
Calcium: 9.5 mg/dL (ref 8.9–10.3)
GFR calc non Af Amer: 56 mL/min — ABNORMAL LOW (ref >60)
GLUCOSE: 140 mg/dL — AB (ref 65–99)
Potassium: 3.2 mmol/L — ABNORMAL LOW (ref 3.5–5.1)
SODIUM: 138 mmol/L (ref 135–145)
Total Bilirubin: 1.1 mg/dL (ref 0.3–1.2)
Total Protein: 7.1 g/dL (ref 6.5–8.1)

## 2017-10-28 LAB — LIPID PANEL
Cholesterol: 162 mg/dL (ref 0–200)
HDL: 40 mg/dL — ABNORMAL LOW (ref >40)
LDL CALC: 77 mg/dL (ref 0–99)
TRIGLYCERIDES: 225 mg/dL — AB (ref <150)
Total CHOL/HDL Ratio: 4.1 RATIO
VLDL: 45 mg/dL — AB (ref 0–40)

## 2017-10-28 MED ORDER — POTASSIUM CHLORIDE ER 10 MEQ PO TBCR: 10.0000 meq | EXTENDED_RELEASE_TABLET | Freq: Every day | ORAL | 1 refills | Status: DC

## 2017-10-28 MED FILL — POTASSIUM CL ER 10 MEQ TABL: 10 | 90 days supply | Qty: 90 | Fill #0

## 2017-10-29 ENCOUNTER — Other Ambulatory Visit: Payer: Self-pay

## 2017-10-29 DIAGNOSIS — E876 Hypokalemia: Secondary | ICD-10-CM

## 2017-11-05 DIAGNOSIS — H5213 Myopia, bilateral: Secondary | ICD-10-CM | POA: Diagnosis not present

## 2017-11-05 DIAGNOSIS — E119 Type 2 diabetes mellitus without complications: Secondary | ICD-10-CM | POA: Diagnosis not present

## 2017-11-05 DIAGNOSIS — Z135 Encounter for screening for eye and ear disorders: Secondary | ICD-10-CM | POA: Diagnosis not present

## 2017-11-05 LAB — HM DIABETES EYE EXAM

## 2017-11-05 MED FILL — ACCU-CHEK GUIDE TEST STRIP: 90 days supply | Qty: 100 | Fill #3

## 2017-11-05 MED FILL — ACCU-CHEK FASTCLIX LANCETS: 90 days supply | Qty: 102 | Fill #1

## 2017-11-08 ENCOUNTER — Other Ambulatory Visit: Payer: Self-pay | Admitting: Family Medicine

## 2017-11-08 MED FILL — LOSARTAN POTASSIUM 100 MG T: 100 | 90 days supply | Qty: 90 | Fill #0

## 2017-11-15 MED FILL — metFORMIN HCL 500 MG TABS: 500 | 90 days supply | Qty: 270 | Fill #1

## 2017-11-18 DIAGNOSIS — G4733 Obstructive sleep apnea (adult) (pediatric): Secondary | ICD-10-CM | POA: Diagnosis not present

## 2017-12-01 MED FILL — AMITRIPTYLINE HCL 25 MG TAB: 25 | 90 days supply | Qty: 180 | Fill #1

## 2017-12-01 MED FILL — CHLORTHALIDONE 50 MG TABS: 50 | 90 days supply | Qty: 180 | Fill #2

## 2017-12-05 MED FILL — PANTOPRAZOLE SOD DR 40 MG T: 40 | 90 days supply | Qty: 90 | Fill #2

## 2017-12-14 MED FILL — LEVOTHYROXINE 137 MCG TABLE: 137 | 90 days supply | Qty: 90 | Fill #1

## 2017-12-21 ENCOUNTER — Encounter: Payer: Self-pay | Admitting: Family Medicine

## 2018-01-05 MED FILL — ALLOPURINOL 100 MG TABS: 100 | 90 days supply | Qty: 90 | Fill #1

## 2018-01-06 ENCOUNTER — Other Ambulatory Visit: Payer: Self-pay | Admitting: Family Medicine

## 2018-01-06 MED FILL — MELOXICAM 15 MG TABLET: 15 | 90 days supply | Qty: 90 | Fill #0

## 2018-01-10 MED FILL — ATORVASTATIN 40 MG TABLET: 40 | 90 days supply | Qty: 90 | Fill #1

## 2018-01-26 MED FILL — POTASSIUM CL ER 10 MEQ TABL: 10 | 90 days supply | Qty: 90 | Fill #1

## 2018-02-01 MED FILL — LOSARTAN POTASSIUM 100 MG T: 100 | 90 days supply | Qty: 90 | Fill #1

## 2018-02-01 MED FILL — ACCU-CHEK GUIDE TEST STRIP: 90 days supply | Qty: 100 | Fill #4

## 2018-02-14 MED FILL — metFORMIN HCL 500 MG TABS: 500 | 90 days supply | Qty: 270 | Fill #2

## 2018-02-18 ENCOUNTER — Encounter: Payer: 59 | Admitting: Family Medicine

## 2018-02-22 ENCOUNTER — Ambulatory Visit: Payer: 59 | Admitting: Family Medicine

## 2018-02-22 ENCOUNTER — Encounter: Payer: 59 | Admitting: Family Medicine

## 2018-03-07 ENCOUNTER — Encounter: Payer: Self-pay | Admitting: Family Medicine

## 2018-03-07 ENCOUNTER — Ambulatory Visit (INDEPENDENT_AMBULATORY_CARE_PROVIDER_SITE_OTHER): Payer: 59 | Admitting: Family Medicine

## 2018-03-07 VITALS — BP 132/64 | HR 89 | Ht 66.0 in | Wt 218.0 lb

## 2018-03-07 DIAGNOSIS — E876 Hypokalemia: Secondary | ICD-10-CM | POA: Diagnosis not present

## 2018-03-07 DIAGNOSIS — E119 Type 2 diabetes mellitus without complications: Secondary | ICD-10-CM

## 2018-03-07 DIAGNOSIS — Z0000 Encounter for general adult medical examination without abnormal findings: Secondary | ICD-10-CM | POA: Diagnosis not present

## 2018-03-07 DIAGNOSIS — E038 Other specified hypothyroidism: Secondary | ICD-10-CM

## 2018-03-07 DIAGNOSIS — L570 Actinic keratosis: Secondary | ICD-10-CM | POA: Diagnosis not present

## 2018-03-07 DIAGNOSIS — M109 Gout, unspecified: Secondary | ICD-10-CM | POA: Insufficient documentation

## 2018-03-07 DIAGNOSIS — M1A09X0 Idiopathic chronic gout, multiple sites, without tophus (tophi): Secondary | ICD-10-CM

## 2018-03-07 LAB — POCT GLYCOSYLATED HEMOGLOBIN (HGB A1C): HEMOGLOBIN A1C: 6.7

## 2018-03-07 MED ORDER — IMIQUIMOD 5 % EX CREA: TOPICAL_CREAM | CUTANEOUS | 3 refills | Status: DC

## 2018-03-07 MED FILL — IMIQUIMOD 5 % CREA: 5 | 42 days supply | Qty: 12 | Fill #0

## 2018-03-07 NOTE — Patient Instructions (Signed)

## 2018-03-07 NOTE — Progress Notes (Signed)
Subjective:     Alexis Mcneil is a 62 y.o. female and is here for a comprehensive physical exam. The patient reports problems - feet are doing well overall wiht her neuropathy. Some days pain is there and other days she does really well. The amitryptiline does well but giver her a very dry mouth.    Social History   Socioeconomic History  . Marital status: Single    Spouse name: Not on file  . Number of children: Not on file  . Years of education: Not on file  . Highest education level: Not on file  Occupational History  . Not on file  Social Needs  . Financial resource strain: Not on file  . Food insecurity:    Worry: Not on file    Inability: Not on file  . Transportation needs:    Medical: Not on file    Non-medical: Not on file  Tobacco Use  . Smoking status: Never Smoker  . Smokeless tobacco: Never Used  Substance and Sexual Activity  . Alcohol use: Yes    Alcohol/week: 0.0 oz    Comment: occasional beer on the weekends.    . Drug use: No  . Sexual activity: Yes    Birth control/protection: Post-menopausal  Lifestyle  . Physical activity:    Days per week: Not on file    Minutes per session: Not on file  . Stress: Not on file  Relationships  . Social connections:    Talks on phone: Not on file    Gets together: Not on file    Attends religious service: Not on file    Active member of club or organization: Not on file    Attends meetings of clubs or organizations: Not on file    Relationship status: Not on file  . Intimate partner violence:    Fear of current or ex partner: Not on file    Emotionally abused: Not on file    Physically abused: Not on file    Forced sexual activity: Not on file  Other Topics Concern  . Not on file  Social History Narrative   Lives alone in a 2 story home.  Has no children.     Works for [employer].     Education: associates degree.   Health Maintenance  Topic Date Due  . HIV Screening  07/21/1971  .  OPHTHALMOLOGY EXAM  08/31/2017  . FOOT EXAM  02/15/2018  . HEMOGLOBIN A1C  04/22/2018  . INFLUENZA VACCINE  06/09/2018  . PAP SMEAR  02/12/2019  . COLONOSCOPY  03/03/2019  . MAMMOGRAM  06/15/2019  . PNEUMOCOCCAL POLYSACCHARIDE VACCINE (2) 09/22/2020  . TETANUS/TDAP  05/26/2025  . Hepatitis C Screening  Completed    The following portions of the patient's history were reviewed and updated as appropriate: allergies, current medications, past family history, past medical history, past social history, past surgical history and problem list.  Review of Systems A comprehensive review of systems was negative.   Objective:    BP 132/64   Pulse 89   Ht 5\' 6"  (1.676 m)   Wt 218 lb (98.9 kg)   SpO2 99%   BMI 35.19 kg/m  General appearance: alert, cooperative and appears stated age Head: Normocephalic, without obvious abnormality, atraumatic Eyes: conj clear, EOMI, PEERLA Ears: normal TM's and external ear canals both ears Nose: Nares normal. Septum midline. Mucosa normal. No drainage or sinus tenderness. Throat: lips, mucosa, and tongue normal; teeth and gums normal Neck:  no adenopathy, no carotid bruit, no JVD, supple, symmetrical, trachea midline and thyroid not enlarged, symmetric, no tenderness/mass/nodules Back: symmetric, no curvature. ROM normal. No CVA tenderness. Lungs: clear to auscultation bilaterally Breasts: normal appearance, no masses or tenderness Heart: regular rate and rhythm, S1, S2 normal, no murmur, click, rub or gallop Abdomen: soft, non-tender; bowel sounds normal; no masses,  no organomegaly Extremities: extremities normal, atraumatic, no cyanosis or edema Pulses: 2+ and symmetric Skin: no rashes.. Skin tags under axilla.  She has several AKs on her forearms and left side of her neck.   Lymph nodes: Cervical, supraclavicular, and axillary nodes normal. Neurologic: Alert and oriented X 3, normal strength and tone. Normal symmetric reflexes. Normal coordination  and gait    Assessment:    Healthy female exam.   Plan:     See After Visit Summary for Counseling Recommendations   Keep up a regular exercise program and make sure you are eating a healthy diet Try to eat 4 servings of dairy a day, or if you are lactose intolerant take a calcium with vitamin D daily.  Your vaccines are up to date.  Plans on scheduling with her GYN for her Pap smear. Phillip Heal up-to-date.  Diabetes-hemoglobin A1c up to 6.7 which is up from previous of 6.2.  We discussed the importance of getting back on track with healthy diet and staying active.  Follow-up in 3 months.  Actinic keratoses-we discussed treatment options including cryotherapy and topical imiquimod.  She would like to try the cream.  Apply twice weekly for 16 weeks.  Explained that it will cause the skin to be red and irritated.  Skin tags-she is welcome to schedule appointment at any time so that we can have those removed here in the office.  Dry mouth - using biotine. Can try Xylimelts too.

## 2018-03-09 MED FILL — PANTOPRAZOLE SOD DR 40 MG T: 40 | 90 days supply | Qty: 90 | Fill #3

## 2018-03-10 ENCOUNTER — Other Ambulatory Visit (HOSPITAL_COMMUNITY)
Admission: RE | Admit: 2018-03-10 | Discharge: 2018-03-10 | Disposition: A | Discharge status: Home / Self Care | Payer: 59 | Source: Ambulatory Visit | Attending: Family Medicine | Admitting: Family Medicine

## 2018-03-10 DIAGNOSIS — E876 Hypokalemia: Secondary | ICD-10-CM | POA: Insufficient documentation

## 2018-03-10 LAB — URIC ACID: URIC ACID, SERUM: 6.9 mg/dL — AB (ref 2.3–6.6)

## 2018-03-10 LAB — BASIC METABOLIC PANEL
Anion gap: 14 (ref 5–15)
BUN: 27 mg/dL — ABNORMAL HIGH (ref 6–20)
CO2: 27 mmol/L (ref 22–32)
Calcium: 9.2 mg/dL (ref 8.9–10.3)
Chloride: 98 mmol/L — ABNORMAL LOW (ref 101–111)
Creatinine, Ser: 1.27 mg/dL — ABNORMAL HIGH (ref 0.44–1.00)
GFR, EST AFRICAN AMERICAN: 52 mL/min — AB (ref >60)
GFR, EST NON AFRICAN AMERICAN: 45 mL/min — AB (ref >60)
GLUCOSE: 138 mg/dL — AB (ref 65–99)
POTASSIUM: 3.1 mmol/L — AB (ref 3.5–5.1)
Sodium: 139 mmol/L (ref 135–145)

## 2018-03-10 LAB — TSH: TSH: 1.323 u[IU]/mL (ref 0.350–4.500)

## 2018-03-14 ENCOUNTER — Encounter: Payer: Self-pay | Admitting: Family Medicine

## 2018-03-15 ENCOUNTER — Encounter: Payer: Self-pay | Admitting: Family Medicine

## 2018-03-21 ENCOUNTER — Other Ambulatory Visit: Payer: Self-pay | Admitting: Family Medicine

## 2018-03-21 MED FILL — LEVOTHYROXINE 137 MCG TABLE: 137 | 90 days supply | Qty: 90 | Fill #0

## 2018-03-28 ENCOUNTER — Other Ambulatory Visit: Payer: Self-pay | Admitting: Family Medicine

## 2018-03-28 MED FILL — AMITRIPTYLINE HCL 25 MG TAB: 25 | 90 days supply | Qty: 180 | Fill #0

## 2018-04-05 ENCOUNTER — Other Ambulatory Visit: Payer: Self-pay | Admitting: Family Medicine

## 2018-04-05 ENCOUNTER — Encounter: Payer: Self-pay | Admitting: Family Medicine

## 2018-04-05 MED ORDER — POTASSIUM CHLORIDE ER 10 MEQ PO TBCR: 20.0000 meq | EXTENDED_RELEASE_TABLET | Freq: Every day | ORAL | 1 refills | Status: DC

## 2018-04-05 MED FILL — POTASSIUM CL ER 10 MEQ TABL: 10 | 90 days supply | Qty: 180 | Fill #0

## 2018-04-12 ENCOUNTER — Other Ambulatory Visit: Payer: Self-pay | Admitting: Family Medicine

## 2018-04-12 MED FILL — MELOXICAM 15 MG TABLET: 15 | 90 days supply | Qty: 90 | Fill #1

## 2018-04-12 MED FILL — ATORVASTATIN 40 MG TABLET: 40 | 90 days supply | Qty: 90 | Fill #2

## 2018-04-12 MED FILL — ALLOPURINOL 100 MG TABLET: 100 | 90 days supply | Qty: 90 | Fill #0

## 2018-04-25 ENCOUNTER — Other Ambulatory Visit: Payer: Self-pay | Admitting: Family Medicine

## 2018-04-25 MED FILL — LOSARTAN POTASSIUM 100 MG T: 100 | 90 days supply | Qty: 90 | Fill #0

## 2018-04-26 ENCOUNTER — Other Ambulatory Visit: Payer: Self-pay | Admitting: *Deleted

## 2018-04-26 DIAGNOSIS — I10 Essential (primary) hypertension: Secondary | ICD-10-CM

## 2018-04-26 MED ORDER — CHLORTHALIDONE 50 MG PO TABS: 100.0000 mg | ORAL_TABLET | Freq: Every day | ORAL | 3 refills | Status: DC

## 2018-04-26 MED FILL — CHLORTHALIDONE 50 MG TABS: 50 | 90 days supply | Qty: 180 | Fill #0

## 2018-05-16 ENCOUNTER — Other Ambulatory Visit: Payer: Self-pay | Admitting: Family Medicine

## 2018-05-16 DIAGNOSIS — E1169 Type 2 diabetes mellitus with other specified complication: Secondary | ICD-10-CM

## 2018-05-16 DIAGNOSIS — E669 Obesity, unspecified: Principal | ICD-10-CM

## 2018-05-16 MED FILL — ACCU-CHEK GUIDE TEST STRIP: 90 days supply | Qty: 100 | Fill #0

## 2018-05-16 MED FILL — metFORMIN HCL 500 MG TABS: 500 | 90 days supply | Qty: 270 | Fill #0

## 2018-05-19 DIAGNOSIS — G4733 Obstructive sleep apnea (adult) (pediatric): Secondary | ICD-10-CM | POA: Diagnosis not present

## 2018-05-25 ENCOUNTER — Other Ambulatory Visit: Payer: Self-pay | Admitting: Sports Medicine

## 2018-05-25 ENCOUNTER — Other Ambulatory Visit: Payer: Self-pay | Admitting: Family Medicine

## 2018-05-25 DIAGNOSIS — Z1231 Encounter for screening mammogram for malignant neoplasm of breast: Secondary | ICD-10-CM

## 2018-05-25 MED FILL — IBUPROFEN 800 MG TAB: 800 | 20 days supply | Qty: 60 | Fill #0

## 2018-06-08 ENCOUNTER — Other Ambulatory Visit: Payer: Self-pay | Admitting: Family Medicine

## 2018-06-08 MED FILL — PANTOPRAZOLE SOD DR 40 MG T: 40 | 90 days supply | Qty: 90 | Fill #0

## 2018-06-13 ENCOUNTER — Ambulatory Visit: Payer: 59 | Admitting: Family Medicine

## 2018-06-16 ENCOUNTER — Ambulatory Visit: Payer: 59

## 2018-06-20 MED FILL — LEVOTHYROXINE 137 MCG TABLE: 137 | 90 days supply | Qty: 90 | Fill #1

## 2018-07-01 MED FILL — ATORVASTATIN 40 MG TABLET: 40 | 90 days supply | Qty: 90 | Fill #3

## 2018-07-01 MED FILL — ALLOPURINOL 100 MG TABLET: 100 | 90 days supply | Qty: 90 | Fill #1

## 2018-07-01 MED FILL — POTASSIUM CHL ER M10 TABLET: 10 | 90 days supply | Qty: 180 | Fill #1

## 2018-07-07 ENCOUNTER — Other Ambulatory Visit: Payer: Self-pay | Admitting: Family Medicine

## 2018-07-08 MED FILL — MELOXICAM 15 MG TABLET: 15 | 90 days supply | Qty: 90 | Fill #0

## 2018-07-18 ENCOUNTER — Ambulatory Visit
Admission: RE | Admit: 2018-07-18 | Discharge: 2018-07-18 | Disposition: A | Discharge status: Home / Self Care | Payer: 59 | Source: Ambulatory Visit | Attending: Family Medicine | Admitting: Family Medicine

## 2018-07-18 DIAGNOSIS — Z1231 Encounter for screening mammogram for malignant neoplasm of breast: Secondary | ICD-10-CM

## 2018-07-19 ENCOUNTER — Ambulatory Visit (INDEPENDENT_AMBULATORY_CARE_PROVIDER_SITE_OTHER): Payer: 59 | Admitting: Family Medicine

## 2018-07-19 ENCOUNTER — Encounter: Payer: Self-pay | Admitting: Family Medicine

## 2018-07-19 VITALS — BP 120/58 | HR 87 | Ht 66.0 in | Wt 215.0 lb

## 2018-07-19 DIAGNOSIS — M7062 Trochanteric bursitis, left hip: Secondary | ICD-10-CM | POA: Diagnosis not present

## 2018-07-19 DIAGNOSIS — E1169 Type 2 diabetes mellitus with other specified complication: Secondary | ICD-10-CM

## 2018-07-19 DIAGNOSIS — I10 Essential (primary) hypertension: Secondary | ICD-10-CM | POA: Diagnosis not present

## 2018-07-19 DIAGNOSIS — E669 Obesity, unspecified: Secondary | ICD-10-CM

## 2018-07-19 DIAGNOSIS — M7061 Trochanteric bursitis, right hip: Secondary | ICD-10-CM

## 2018-07-19 LAB — POCT GLYCOSYLATED HEMOGLOBIN (HGB A1C): HEMOGLOBIN A1C: 6.8 % — AB (ref 4.0–5.6)

## 2018-07-19 MED ORDER — METFORMIN HCL 1000 MG PO TABS: 1000.0000 mg | ORAL_TABLET | Freq: Two times a day (BID) | ORAL | 1 refills | Status: DC

## 2018-07-19 NOTE — Progress Notes (Signed)
Subjective:    CC: DM  HPI:  Diabetes - no hypoglycemic events. No wounds or sores that are not healing well. No increased thirst or urination. Checking glucose at home. Taking medications as prescribed without any side effects.  Been eating a little bit more candy.  When she goes to visit her mom who has dementia her mom constantly offers her candy.  She tries to be good but sometimes will does eat it.  She did  Hypertension- Pt denies chest pain, SOB, dizziness, or heart palpitations.  Taking meds as directed w/o problems.  Denies medication side effects.    She also but has been having some left hip pain.  She is had bilateral trochanteric bursitis before.  She was out doing some yard work at her mom's and now has started to bother her.  She says normally she can take some ibuprofen or Aleve for several days and get it to sort of calm down but just does not seem to be responding.   Past medical history, Surgical history, Family history not pertinant except as noted below, Social history, Allergies, and medications have been entered into the medical record, reviewed, and corrections made.   Review of Systems: No fevers, chills, night sweats, weight loss, chest pain, or shortness of breath.   Objective:    General: Well Developed, well nourished, and in no acute distress.  Neuro: Alert and oriented x3, extra-ocular muscles intact, sensation grossly intact.  HEENT: Normocephalic, atraumatic  Skin: Warm and dry, no rashes. Cardiac: Regular rate and rhythm, no murmurs rubs or gallops, no lower extremity edema.  Respiratory: Clear to auscultation bilaterally. Not using accessory muscles, speaking in full sentences. MSK: Very tender over the left greater trochanter.  She has some pain with hip abduction especially against resistance.   Impression and Recommendations:    DM -A1c of 6.8.  Up slightly from previous of 6.7 back in April.  Crease metformin 1000 mg twice a day.  Follow-up in 3  months.  Continue to work on avoiding sugary foods and candy.  HTN  - Well controlled. Continue current regimen. Follow up in  6 months.   Left greater trochanteric bursitis -discussed options.  She has not been responding to conservative therapy which is anti-inflammatories and rest.  Offered to do a bursa injection which she has had before and has been helpful.  Afterwards recommend icing after a few days recommend starting to do stretches.  Handout provided with some home physical therapy.  Aspiration/Injection Procedure Note TAIWANA WILLISON [id number] 24-Apr-1956  Procedure: Injection into the right and left trochanteric bursa.   Indications: pain, not responding to conservative tx.   Procedure Details Consent: Risks of procedure as well as the alternatives and risks of each were explained to the (patient/caregiver).  Consent for procedure obtained. Time Out: Verified patient identification, verified procedure, site/side was marked, verified correct patient position, special equipment/implants available, medications/allergies/relevent history reviewed, required imaging and test results available.  Performed   Local Anesthesia Used:Ethyl Chloride Spray Amount of Fluid Aspirated: minimal amount Character of Fluid: clear  Injected: 40 mg of Kenalog with 9 cc of lidocaine without epi.  A sterile dressing was applied.  Patient did tolerate procedure well. Estimated blood loss: 0  Beatrice Lecher 07/19/2018, 4:29 PM

## 2018-07-19 NOTE — Patient Instructions (Signed)
Ice your hip for about 5 to 10 minutes when you get home and then again at bedtime.  If your pain is improving over the next couple days then after the weekend and when she does start doing some gentle leg lifts to strengthen the hip.  Muscle can give you a handout of exercises to do over the next few weeks.

## 2018-07-20 ENCOUNTER — Telehealth: Payer: Self-pay | Admitting: Family Medicine

## 2018-07-20 NOTE — Telephone Encounter (Signed)
Call pt: her last bone density in 2014 was in the normal range.  Recommend repeat when she is 80.

## 2018-07-21 NOTE — Telephone Encounter (Signed)
Called patient and notified of normal bone density and repeat at age 62. KG LPN

## 2018-07-28 MED FILL — metFORMIN HCL 1000 MG TABS: 1000 | 90 days supply | Qty: 180 | Fill #0

## 2018-08-04 MED FILL — AMITRIPTYLINE HCL 25 MG TAB: 25 | 90 days supply | Qty: 180 | Fill #1

## 2018-08-05 ENCOUNTER — Other Ambulatory Visit: Payer: Self-pay | Admitting: Family Medicine

## 2018-08-05 MED FILL — LOSARTAN POTASSIUM 100 MG T: 100 | 90 days supply | Qty: 90 | Fill #0

## 2018-08-11 MED FILL — ACCU-CHEK GUIDE TEST STRIP: 90 days supply | Qty: 100 | Fill #1

## 2018-08-11 MED FILL — CHLORTHALIDONE 50 MG TABS: 50 | 90 days supply | Qty: 180 | Fill #1

## 2018-09-19 ENCOUNTER — Other Ambulatory Visit: Payer: Self-pay | Admitting: Family Medicine

## 2018-09-19 MED FILL — PANTOPRAZOLE SOD DR 40 MG T: 40 | 90 days supply | Qty: 90 | Fill #1

## 2018-09-19 MED FILL — LEVOTHYROXINE 137 MCG TABLE: 137 | 90 days supply | Qty: 90 | Fill #0

## 2018-10-03 ENCOUNTER — Other Ambulatory Visit: Payer: Self-pay | Admitting: Family Medicine

## 2018-10-03 MED FILL — POTASSIUM CHL ER M10 TABLET: 10 | 90 days supply | Qty: 180 | Fill #0

## 2018-10-03 MED FILL — ATORVASTATIN 40 MG TABLET: 40 | 90 days supply | Qty: 90 | Fill #0

## 2018-10-03 MED FILL — ALLOPURINOL 100 MG TABS: 100 | 90 days supply | Qty: 90 | Fill #0

## 2018-10-18 ENCOUNTER — Ambulatory Visit: Payer: 59 | Admitting: Family Medicine

## 2018-10-22 MED FILL — MELOXICAM 15 MG TABLET: 15 | 90 days supply | Qty: 90 | Fill #1

## 2018-10-22 MED FILL — IMIQUIMOD 5 % CREA: 5 | 42 days supply | Qty: 12 | Fill #1

## 2018-11-05 DIAGNOSIS — H04123 Dry eye syndrome of bilateral lacrimal glands: Secondary | ICD-10-CM | POA: Diagnosis not present

## 2018-11-05 DIAGNOSIS — E119 Type 2 diabetes mellitus without complications: Secondary | ICD-10-CM | POA: Diagnosis not present

## 2018-11-05 DIAGNOSIS — H5213 Myopia, bilateral: Secondary | ICD-10-CM | POA: Diagnosis not present

## 2018-11-05 DIAGNOSIS — Z135 Encounter for screening for eye and ear disorders: Secondary | ICD-10-CM | POA: Diagnosis not present

## 2018-11-05 LAB — HM DIABETES EYE EXAM

## 2018-11-06 MED FILL — metFORMIN HCL 1000 MG TABS: 1000 | 90 days supply | Qty: 180 | Fill #1

## 2018-11-07 ENCOUNTER — Other Ambulatory Visit: Payer: Self-pay | Admitting: Family Medicine

## 2018-11-07 MED FILL — LOSARTAN POTASSIUM 100 MG T: 100 | 30 days supply | Qty: 30 | Fill #0

## 2018-11-18 ENCOUNTER — Encounter: Payer: Self-pay | Admitting: Family Medicine

## 2018-11-29 ENCOUNTER — Ambulatory Visit: Payer: 59 | Admitting: Family Medicine

## 2018-12-06 MED FILL — LOSARTAN POTASSIUM 100 MG T: 100 | 30 days supply | Qty: 30 | Fill #1

## 2018-12-13 MED FILL — CHLORTHALIDONE 50 MG TABS: 50 | 90 days supply | Qty: 180 | Fill #2

## 2018-12-13 MED FILL — LEVOTHYROXINE 137 MCG TABLE: 137 | 90 days supply | Qty: 90 | Fill #1

## 2018-12-20 ENCOUNTER — Encounter: Payer: Self-pay | Admitting: Family Medicine

## 2018-12-20 ENCOUNTER — Ambulatory Visit (INDEPENDENT_AMBULATORY_CARE_PROVIDER_SITE_OTHER): Payer: 59 | Admitting: Family Medicine

## 2018-12-20 VITALS — BP 129/65 | HR 87 | Ht 66.0 in | Wt 219.0 lb

## 2018-12-20 DIAGNOSIS — E1169 Type 2 diabetes mellitus with other specified complication: Secondary | ICD-10-CM | POA: Diagnosis not present

## 2018-12-20 DIAGNOSIS — L570 Actinic keratosis: Secondary | ICD-10-CM | POA: Diagnosis not present

## 2018-12-20 DIAGNOSIS — R209 Unspecified disturbances of skin sensation: Secondary | ICD-10-CM

## 2018-12-20 DIAGNOSIS — I10 Essential (primary) hypertension: Secondary | ICD-10-CM

## 2018-12-20 DIAGNOSIS — E669 Obesity, unspecified: Secondary | ICD-10-CM

## 2018-12-20 DIAGNOSIS — M1A09X0 Idiopathic chronic gout, multiple sites, without tophus (tophi): Secondary | ICD-10-CM | POA: Diagnosis not present

## 2018-12-20 DIAGNOSIS — G609 Hereditary and idiopathic neuropathy, unspecified: Secondary | ICD-10-CM | POA: Diagnosis not present

## 2018-12-20 LAB — POCT GLYCOSYLATED HEMOGLOBIN (HGB A1C): Hemoglobin A1C: 6.7 % — AB (ref 4.0–5.6)

## 2018-12-20 MED ORDER — DULOXETINE HCL 30 MG PO CPEP: 30.0000 mg | ORAL_CAPSULE | Freq: Every day | ORAL | 3 refills | Status: DC

## 2018-12-20 MED FILL — DULoxetine HCL 30 MG CPEP: 30 | 30 days supply | Qty: 30 | Fill #0

## 2018-12-20 MED FILL — PANTOPRAZOLE SOD DR 40 MG T: 40 | 90 days supply | Qty: 90 | Fill #2

## 2018-12-20 NOTE — Progress Notes (Addendum)
Subjective:    CC: Diabetes   HPI:  Diabetes - no hypoglycemic events. No wounds or sores that are not healing well. No increased thirst or urination. Checking glucose at home. Taking medications as prescribed without any side effects.  Hypertension- Pt denies chest pain, SOB, dizziness, or heart palpitations.  Taking meds as directed w/o problems.  Denies medication side effects.    F/U peripheral neuropathy - currently on amitriptyline.  She says it does seem to help though does not completely control her symptoms but it just causes such excessive dry mouth and at one point where even tried to go up and she was just unable tolerate to tolerate it because of the dry mouth.  She says when she wakes up in the morning she just feels like her feet are balloons when she first steps on them.  Even though they do not look swollen they more recently she is had a little bit of localized swelling right around the second and third distal metatarsal heads.  Though she denies any injury or trauma.  Though she did wear some more tight fitting shoes on her left foot and then later when she took her shoe off a couple days later is noticed that she had a little bleeding under her great toenail from where the she most of them pressing on her toe.  The end of the day she feels like her feet are on fire and burning at times she goes home and takes off her shoes within half an hour to an hour they actually start to feel cold the cold sensation started more recently about 2 months ago.  Is often the bottoms of her feet will look flushed.  Follow-up actinic Jeanmarie Plant did apply the medical mode twice a week for 4 months and says she is not sure that it really helped very much.  She has several that still feels scaly but she admits she does pick at them a lot.  Past medical history, Surgical history, Family history not pertinant except as noted below, Social history, Allergies, and medications have been entered into the  medical record, reviewed, and corrections made.   Review of Systems: No fevers, chills, night sweats, weight loss, chest pain, or shortness of breath.   Objective:    General: Well Developed, well nourished, and in no acute distress.  Neuro: Alert and oriented x3, extra-ocular muscles intact, sensation grossly intact.  HEENT: Normocephalic, atraumatic  Skin: Warm and dry, no rashes.  Cardiac: Regular rate and rhythm, no murmurs rubs or gallops, no lower extremity edema.  Respiratory: Clear to auscultation bilaterally. Not using accessory muscles, speaking in full sentences. MSK: nontender over the distal MT on the right foot.  Posterior tibial pulse 2+, dorsal pedal pulse trace.  No discoloration of the toes. No significant edema.  Has some blood under the left great toenail.    Impression and Recommendations:    DM - Well controlled. A1C if 6.7.  Continue current regimen. Follow up in  3-4 months.    HTN - Well controlled. Continue current regimen. Follow up in  4 monhts.    Peripheral neuropathy- wean amitryptiline. Will try cymbalta. Discussed medication options.  The gabapentin causes vision changes.    Actinic keratoses - will refer to Dermatology.  No significant response to imiquimod.  We discussed the cryotherapy as an option.  For now she would prefer a dermatology referral so we will go ahead and place that.  Her last Pap smear was normal  with negative HPV testing so she is due for a repeat in 5 years instead of 3 years.  Also verify on her colonoscopy that she is due for 5-year repeat.  Her father had colon cancer.  She will call to schedule that.

## 2018-12-20 NOTE — Patient Instructions (Signed)
Cut your amitriptyline in half and take a half a tab nightly for 2 nights and then stop. That okay to start the duloxetine.

## 2018-12-20 NOTE — Addendum Note (Signed)
Addended by: Beatrice Lecher D on: 12/20/2018 05:34 PM   Modules accepted: Orders

## 2018-12-21 ENCOUNTER — Encounter: Payer: Self-pay | Admitting: Family Medicine

## 2018-12-21 ENCOUNTER — Other Ambulatory Visit (HOSPITAL_COMMUNITY)
Admission: RE | Admit: 2018-12-21 | Discharge: 2018-12-21 | Disposition: A | Discharge status: Home / Self Care | Payer: 59 | Source: Ambulatory Visit | Attending: Family Medicine | Admitting: Family Medicine

## 2018-12-21 DIAGNOSIS — E1169 Type 2 diabetes mellitus with other specified complication: Secondary | ICD-10-CM | POA: Diagnosis not present

## 2018-12-21 DIAGNOSIS — I10 Essential (primary) hypertension: Secondary | ICD-10-CM | POA: Diagnosis not present

## 2018-12-21 LAB — CBC
HEMATOCRIT: 38.8 % (ref 36.0–46.0)
Hemoglobin: 12.8 g/dL (ref 12.0–15.0)
MCH: 29.6 pg (ref 26.0–34.0)
MCHC: 33 g/dL (ref 30.0–36.0)
MCV: 89.6 fL (ref 80.0–100.0)
NRBC: 0 % (ref 0.0–0.2)
Platelets: 236 10*3/uL (ref 150–400)
RBC: 4.33 MIL/uL (ref 3.87–5.11)
RDW: 13.9 % (ref 11.5–15.5)
WBC: 7.4 10*3/uL (ref 4.0–10.5)

## 2018-12-21 LAB — COMPREHENSIVE METABOLIC PANEL
ALBUMIN: 4.5 g/dL (ref 3.5–5.0)
ALK PHOS: 81 U/L (ref 38–126)
ALT: 33 U/L (ref 0–44)
AST: 29 U/L (ref 15–41)
Anion gap: 11 (ref 5–15)
BILIRUBIN TOTAL: 0.8 mg/dL (ref 0.3–1.2)
BUN: 18 mg/dL (ref 8–23)
CALCIUM: 9.1 mg/dL (ref 8.9–10.3)
CO2: 28 mmol/L (ref 22–32)
Chloride: 98 mmol/L (ref 98–111)
Creatinine, Ser: 1.16 mg/dL — ABNORMAL HIGH (ref 0.44–1.00)
GFR calc Af Amer: 58 mL/min — ABNORMAL LOW (ref >60)
GFR calc non Af Amer: 50 mL/min — ABNORMAL LOW (ref >60)
GLUCOSE: 140 mg/dL — AB (ref 70–99)
POTASSIUM: 3.2 mmol/L — AB (ref 3.5–5.1)
Sodium: 137 mmol/L (ref 135–145)
TOTAL PROTEIN: 7.5 g/dL (ref 6.5–8.1)

## 2018-12-21 LAB — LIPID PANEL
CHOLESTEROL: 153 mg/dL (ref 0–200)
HDL: 40 mg/dL — ABNORMAL LOW (ref >40)
LDL Cholesterol: 66 mg/dL (ref 0–99)
TRIGLYCERIDES: 237 mg/dL — AB (ref <150)
Total CHOL/HDL Ratio: 3.8 RATIO
VLDL: 47 mg/dL — ABNORMAL HIGH (ref 0–40)

## 2018-12-21 LAB — TSH: TSH: 2.268 u[IU]/mL (ref 0.350–4.500)

## 2018-12-21 LAB — URIC ACID: Uric Acid, Serum: 6.4 mg/dL (ref 2.5–7.1)

## 2018-12-22 ENCOUNTER — Other Ambulatory Visit: Payer: Self-pay | Admitting: Family Medicine

## 2018-12-22 DIAGNOSIS — I10 Essential (primary) hypertension: Secondary | ICD-10-CM

## 2018-12-22 MED ORDER — CHLORTHALIDONE 50 MG PO TABS: 50.0000 mg | ORAL_TABLET | Freq: Every day | ORAL | 0 refills | Status: DC

## 2018-12-23 MED ORDER — POTASSIUM CHLORIDE CRYS ER 10 MEQ PO TBCR: 10.0000 meq | EXTENDED_RELEASE_TABLET | Freq: Three times a day (TID) | ORAL | 1 refills | Status: DC

## 2018-12-23 MED FILL — POTASSIUM CHL ER M10 TABLET: 10 | 90 days supply | Qty: 270 | Fill #0

## 2018-12-23 NOTE — Telephone Encounter (Signed)
Medication sent, patient aware.

## 2018-12-26 ENCOUNTER — Ambulatory Visit (HOSPITAL_COMMUNITY)
Admission: RE | Admit: 2018-12-26 | Discharge: 2018-12-26 | Disposition: A | Discharge status: Home / Self Care | Payer: 59 | Source: Ambulatory Visit | Attending: Family Medicine | Admitting: Family Medicine

## 2018-12-26 DIAGNOSIS — R209 Unspecified disturbances of skin sensation: Secondary | ICD-10-CM | POA: Diagnosis not present

## 2018-12-26 NOTE — Progress Notes (Signed)
ABI's have been completed. Preliminary results can be found in CV Proc through chart review.   12/26/18 4:24 PM Alexis Mcneil RVT

## 2018-12-27 ENCOUNTER — Encounter (HOSPITAL_COMMUNITY): Payer: 59

## 2018-12-28 ENCOUNTER — Encounter: Payer: Self-pay | Admitting: Family Medicine

## 2019-01-03 MED FILL — ALLOPURINOL 100 MG TABS: 100 | 90 days supply | Qty: 90 | Fill #1

## 2019-01-03 MED FILL — LOSARTAN POTASSIUM 100 MG T: 100 | 30 days supply | Qty: 30 | Fill #2

## 2019-01-12 MED FILL — IBUPROFEN 800 MG TAB: 800 | 20 days supply | Qty: 60 | Fill #1

## 2019-01-12 MED FILL — ATORVASTATIN 40 MG TABLET: 40 | 90 days supply | Qty: 90 | Fill #1

## 2019-01-17 ENCOUNTER — Other Ambulatory Visit: Payer: Self-pay | Admitting: Family Medicine

## 2019-01-17 MED FILL — MELOXICAM 15 MG TABLET: 15 | 90 days supply | Qty: 90 | Fill #0

## 2019-01-17 MED FILL — DULoxetine HCL 30 MG CPEP: 30 | 30 days supply | Qty: 30 | Fill #1

## 2019-01-30 ENCOUNTER — Other Ambulatory Visit: Payer: Self-pay | Admitting: Family Medicine

## 2019-01-30 MED FILL — metFORMIN HCL 1000 MG TABS: 1000 | 90 days supply | Qty: 180 | Fill #0

## 2019-02-01 MED FILL — LOSARTAN POTASSIUM 100 MG T: 100 | 30 days supply | Qty: 30 | Fill #3

## 2019-02-01 MED FILL — ACCU-CHEK GUIDE TEST STRIP: 90 days supply | Qty: 100 | Fill #3

## 2019-02-04 ENCOUNTER — Other Ambulatory Visit: Payer: Self-pay | Admitting: Family Medicine

## 2019-02-04 MED FILL — DULoxetine HCL 30 MG CPEP: 30 | 30 days supply | Qty: 30 | Fill #2

## 2019-03-02 MED FILL — AMOXICILLIN 500 MG CAPSULE: 500 | 7 days supply | Qty: 21 | Fill #0

## 2019-03-07 MED FILL — LOSARTAN POTASSIUM 100 MG T: 100 | 30 days supply | Qty: 30 | Fill #4

## 2019-03-09 MED FILL — AMOXICILLIN 500 MG CAPSULE: 500 | 7 days supply | Qty: 21 | Fill #1

## 2019-03-14 ENCOUNTER — Encounter: Payer: Self-pay | Admitting: Gastroenterology

## 2019-03-15 ENCOUNTER — Other Ambulatory Visit: Payer: Self-pay | Admitting: Family Medicine

## 2019-03-15 MED FILL — LEVOTHYROXINE 137 MCG TABLE: 137 | 90 days supply | Qty: 90 | Fill #0

## 2019-03-15 MED FILL — DULoxetine HCL 30 MG CPEP: 30 | 30 days supply | Qty: 30 | Fill #3

## 2019-03-21 MED FILL — PANTOPRAZOLE SOD DR 40 MG T: 40 | 90 days supply | Qty: 90 | Fill #3

## 2019-03-27 ENCOUNTER — Ambulatory Visit (INDEPENDENT_AMBULATORY_CARE_PROVIDER_SITE_OTHER): Payer: 59 | Admitting: Family Medicine

## 2019-03-27 ENCOUNTER — Encounter: Payer: Self-pay | Admitting: Family Medicine

## 2019-03-27 VITALS — BP 127/72 | HR 80 | Ht 66.0 in | Wt 217.0 lb

## 2019-03-27 DIAGNOSIS — I10 Essential (primary) hypertension: Secondary | ICD-10-CM | POA: Diagnosis not present

## 2019-03-27 DIAGNOSIS — G609 Hereditary and idiopathic neuropathy, unspecified: Secondary | ICD-10-CM

## 2019-03-27 DIAGNOSIS — E669 Obesity, unspecified: Secondary | ICD-10-CM | POA: Diagnosis not present

## 2019-03-27 DIAGNOSIS — E1169 Type 2 diabetes mellitus with other specified complication: Secondary | ICD-10-CM | POA: Diagnosis not present

## 2019-03-27 MED ORDER — DULOXETINE HCL 30 MG PO CPEP: 30.0000 mg | ORAL_CAPSULE | Freq: Every day | ORAL | 1 refills | Status: DC

## 2019-03-27 NOTE — Progress Notes (Signed)
BS:132 Pt reports that the Duloxetine works well for the neuropathy in here feet however, she does get some blurred vision in the mornings that goes away after a few hours.Alexis Mcneil, [place]

## 2019-03-27 NOTE — Progress Notes (Signed)
Virtual Visit via Video Note  I connected with Alexis Mcneil on 03/27/19 at  3:20 PM EDT by a video enabled telemedicine application and verified that I am speaking with the correct person using two identifiers.   I discussed the limitations of evaluation and management by telemedicine and the availability of in person appointments. The patient expressed understanding and agreed to proceed.  Subjective:    CC: DM  HPI:  Diabetes - no hypoglycemic events. No wounds or sores that are not healing well. No increased thirst or urination. Checking glucose at home. 130s. Taking medications as prescribed without any side effects.  Hypertension- Pt denies chest pain, SOB, dizziness, or heart palpitations.  Taking meds as directed w/o problems.  Denies medication side effects.    F/U peripheral neuropathy - was on amitriptyline but we changed her to Cymbalta. Pt reports that the Duloxetine works well for the neuropathy in here feet however. She does get some blurred vision in the mornings that goes away after a few hours.   Feels like pulled her hamstring gardening in the yard. She has been putting a ball on it.   Reminded her to call to schedule her colonoscopy  Past medical history, Surgical history, Family history not pertinant except as noted below, Social history, Allergies, and medications have been entered into the medical record, reviewed, and corrections made.   Review of Systems: No fevers, chills, night sweats, weight loss, chest pain, or shortness of breath.   Objective:    General: Speaking clearly in complete sentences without any shortness of breath.  Alert and oriented x3.  Normal judgment. No apparent acute distress.    Impression and Recommendations:    DM - stable. Continue current regimen. F/U in 3 months.  We will just check A1c again in 3 months since the last 1 looked good.  HTN  - Well controlled. Continue current regimen. Follow up in  6 months.   Peripheral  Neuropathy - she like the cymbalta works better than the amitryptiline.      I discussed the assessment and treatment plan with the patient. The patient was provided an opportunity to ask questions and all were answered. The patient agreed with the plan and demonstrated an understanding of the instructions.   The patient was advised to call back or seek an in-person evaluation if the symptoms worsen or if the condition fails to improve as anticipated.   Beatrice Lecher, MD

## 2019-03-31 ENCOUNTER — Encounter: Payer: Self-pay | Admitting: Gastroenterology

## 2019-04-04 MED FILL — LOSARTAN POTASSIUM 100 MG T: 100 | 30 days supply | Qty: 30 | Fill #5

## 2019-04-04 MED FILL — ALLOPURINOL 100 MG TABS: 100 | 90 days supply | Qty: 90 | Fill #0

## 2019-04-05 ENCOUNTER — Ambulatory Visit: Payer: 59 | Admitting: *Deleted

## 2019-04-05 ENCOUNTER — Other Ambulatory Visit: Payer: Self-pay

## 2019-04-05 VITALS — Ht 65.0 in | Wt 210.0 lb

## 2019-04-05 DIAGNOSIS — Z800 Family history of malignant neoplasm of digestive organs: Secondary | ICD-10-CM

## 2019-04-05 MED ORDER — NA SULFATE-K SULFATE-MG SULF 17.5-3.13-1.6 GM/177ML PO SOLN: 1.0000 | Freq: Once | ORAL | 0 refills | Status: AC

## 2019-04-05 MED FILL — SUPREP BOWEL PREP KIT: 17.5-3.13-1 | 2 days supply | Qty: 354 | Fill #0

## 2019-04-05 NOTE — Progress Notes (Signed)

## 2019-04-06 MED FILL — ATORVASTATIN 40 MG TABLET: 40 | 90 days supply | Qty: 90 | Fill #2

## 2019-04-17 MED FILL — CHLORTHALIDONE 50 MG TABLET: 50 | 90 days supply | Qty: 180 | Fill #3

## 2019-04-17 MED FILL — POTASSIUM CHL ER M10 TABLET: 10 | 90 days supply | Qty: 270 | Fill #1

## 2019-04-17 MED FILL — DULoxetine HCL 30 MG CPEP: 30 | 90 days supply | Qty: 90 | Fill #0

## 2019-04-19 ENCOUNTER — Encounter: Payer: Self-pay | Admitting: Gastroenterology

## 2019-04-21 ENCOUNTER — Encounter: Payer: 59 | Admitting: Gastroenterology

## 2019-04-26 ENCOUNTER — Telehealth: Payer: Self-pay | Admitting: Gastroenterology

## 2019-04-26 NOTE — Telephone Encounter (Signed)

## 2019-04-27 ENCOUNTER — Ambulatory Visit (AMBULATORY_SURGERY_CENTER): Payer: 59 | Admitting: Gastroenterology

## 2019-04-27 ENCOUNTER — Other Ambulatory Visit: Payer: Self-pay

## 2019-04-27 ENCOUNTER — Encounter: Payer: Self-pay | Admitting: Gastroenterology

## 2019-04-27 VITALS — BP 142/78 | HR 72 | Temp 97.9°F | Resp 15 | Ht 66.0 in | Wt 217.0 lb

## 2019-04-27 DIAGNOSIS — E119 Type 2 diabetes mellitus without complications: Secondary | ICD-10-CM | POA: Diagnosis not present

## 2019-04-27 DIAGNOSIS — D122 Benign neoplasm of ascending colon: Secondary | ICD-10-CM | POA: Diagnosis not present

## 2019-04-27 DIAGNOSIS — G4733 Obstructive sleep apnea (adult) (pediatric): Secondary | ICD-10-CM | POA: Diagnosis not present

## 2019-04-27 DIAGNOSIS — K640 First degree hemorrhoids: Secondary | ICD-10-CM

## 2019-04-27 DIAGNOSIS — D120 Benign neoplasm of cecum: Secondary | ICD-10-CM | POA: Diagnosis not present

## 2019-04-27 DIAGNOSIS — Z800 Family history of malignant neoplasm of digestive organs: Secondary | ICD-10-CM

## 2019-04-27 DIAGNOSIS — K5730 Diverticulosis of large intestine without perforation or abscess without bleeding: Secondary | ICD-10-CM

## 2019-04-27 DIAGNOSIS — Z1211 Encounter for screening for malignant neoplasm of colon: Secondary | ICD-10-CM

## 2019-04-27 DIAGNOSIS — I10 Essential (primary) hypertension: Secondary | ICD-10-CM | POA: Diagnosis not present

## 2019-04-27 MED ORDER — SODIUM CHLORIDE 0.9 % IV SOLN: 500.0000 mL | Freq: Once | INTRAVENOUS | Status: DC

## 2019-04-27 NOTE — Patient Instructions (Addendum)
Read all handouts given to you by your recovery room nurse.    YOU SHOULD EXPECT: Some feelings of bloating in the abdomen. Passage of more gas than usual.  Walking can help get rid of the air that was put into your GI tract during the procedure and reduce the bloating. If you had a lower endoscopy (such as a colonoscopy or flexible sigmoidoscopy) you may notice spotting of blood in your stool or on the toilet paper. If you underwent a bowel prep for your procedure, you may not have a normal bowel movement for a few days.  Please Note:  You might notice some irritation and congestion in your nose or some drainage.  This is from the oxygen used during your procedure.  There is no need for concern and it should clear up in a day or so.  SYMPTOMS TO REPORT IMMEDIATELY:   Following lower endoscopy (colonoscopy or flexible sigmoidoscopy):  Excessive amounts of blood in the stool  Significant tenderness or worsening of abdominal pains  Swelling of the abdomen that is new, acute  Fever of 100F or higher   For urgent or emergent issues, a gastroenterologist can be reached at any hour by calling [phone].   DIET:  We do recommend a small meal at first, but then you may proceed to your regular diet.  Drink plenty of fluids but you should avoid alcoholic beverages for 24 hours. Try to increase you diet.  Dr. Bryan Lemma has suggestions on your report sheet.  ACTIVITY:  You should plan to take it easy for the rest of today and you should NOT DRIVE or use heavy machinery until tomorrow (because of the sedation medicines used during the test).    FOLLOW UP: Our staff will call the number listed on your records 48-72 hours following your procedure to check on you and address any questions or concerns that you may have regarding the information given to you following your procedure. If we do not reach you, we will leave a message.  We will attempt to reach you two times.  During this call, we will ask  if you have developed any symptoms of COVID 19. If you develop any symptoms (ie: fever, flu-like symptoms, shortness of breath, cough etc.) before then, please call [phone].  If you test positive for Covid 19 in the 2 weeks post procedure, please call and report this information to [place].    If any biopsies were taken you will be contacted by phone or by letter within the next 1-3 weeks.  Please call us at [phone] if you have not heard about the biopsies in 3 weeks.    SIGNATURES/CONFIDENTIALITY: You and/or your care partner have signed paperwork which will be entered into your electronic medical record.  These signatures attest to the fact that that the information above on your After Visit Summary has been reviewed and is understood.  Full responsibility of the confidentiality of this discharge information lies with you and/or your care-partner.

## 2019-04-27 NOTE — Op Note (Signed)
[place] Patient Name: Alexis Mcneil Procedure Date: 04/27/2019 7:28 AM MRN: [id number] Endoscopist: Gerrit Heck , MD Age: 63 Referring MD:  Date of Birth: Dec 22, 1955 Gender: Female Account #: [id number] Procedure:                Colonoscopy Indications:              Screening for colon cancer: Family history of                            colorectal cancer in distant relative(s), Screening                            in patient at increased risk: Colorectal cancer in                            father                           Last colonoscopy was in 02/2014. No polyps with                            recommendation to repeat in 5 years due to family                            history of colon cancer in father and paternal                            grandmother. She is otherwise without GI symptoms. Medicines:                Monitored Anesthesia Care Procedure:                Pre-Anesthesia Assessment:                           - Prior to the procedure, a History and Physical                            was performed, and patient medications and                            allergies were reviewed. The patient's tolerance of                            previous anesthesia was also reviewed. The risks                            and benefits of the procedure and the sedation                            options and risks were discussed with the patient.                            All questions were answered, and informed consent  was obtained. Prior Anticoagulants: The patient has                            taken no previous anticoagulant or antiplatelet                            agents. ASA Grade Assessment: II - A patient with                            mild systemic disease. After reviewing the risks                            and benefits, the patient was deemed in                            satisfactory condition to undergo the procedure.                  After obtaining informed consent, the colonoscope                            was passed under direct vision. Throughout the                            procedure, the patient's blood pressure, pulse, and                            oxygen saturations were monitored continuously. The                            Colonoscope was introduced through the anus and                            advanced to the the terminal ileum. The colonoscopy                            was performed without difficulty. The patient                            tolerated the procedure well. The quality of the                            bowel preparation was adequate. The terminal ileum,                            ileocecal valve, appendiceal orifice, and rectum                            were photographed. Scope In: 7:49:14 AM Scope Out: 8:06:01 AM Scope Withdrawal Time: 0 hours 14 minutes 12 seconds  Total Procedure Duration: 0 hours 16 minutes 47 seconds  Findings:                 The perianal and digital rectal examinations were  normal.                           Two sessile polyps were found in the ascending                            colon and cecum/appendiceal orifice. The polyps                            were 2 to 3 mm in size. These polyps were removed                            with a cold biopsy forceps. Resection and retrieval                            were complete. Estimated blood loss was minimal.                           A few small-mouthed diverticula were found in the                            sigmoid colon.                           Non-bleeding internal hemorrhoids were found during                            retroflexion. The hemorrhoids were small.                           There was a small lipoma, in the ascending colon.                           Retroflexion in the right colon was performed.                           The terminal ileum appeared  normal. Complications:            No immediate complications. Estimated Blood Loss:     Estimated blood loss was minimal. Impression:               - Two 2 to 3 mm polyps in the ascending colon and                            in the cecum, removed with a cold biopsy forceps.                            Resected and retrieved.                           - Diverticulosis in the sigmoid colon.                           - Non-bleeding internal hemorrhoids.                           -  Small lipoma in the ascending colon.                           - The examined portion of the ileum was normal. Recommendation:           - Discharge patient to home.                           - Resume previous diet.                           - Continue present medications.                           - Await pathology results.                           - Repeat colonoscopy in 5 years for surveillance                            and due to family history of colon cancer.                           - Return to GI clinic PRN.                           - Use fiber, for example Citrucel, Fibercon, Konsyl                            or Metamucil.                           - Internal hemorrhoids were noted on this study and                            may be amenable to hemorrhoid band ligation. If you                            are interested in further treatment of these                            hemorrhoids with band ligation, please contact my                            clinic to set up an appointment for evaluation and                            treatment. Gerrit Heck, MD 04/27/2019 8:13:48 AM

## 2019-04-27 NOTE — Progress Notes (Signed)
Report to PACU, RN, vss, BBS= Clear.  

## 2019-04-27 NOTE — Progress Notes (Signed)
Called to room to assist during endoscopic procedure.  Patient ID and intended procedure confirmed with present staff. Received instructions for my participation in the procedure from the performing physician.  

## 2019-05-01 ENCOUNTER — Telehealth: Payer: Self-pay

## 2019-05-01 ENCOUNTER — Telehealth: Payer: Self-pay | Admitting: *Deleted

## 2019-05-01 NOTE — Telephone Encounter (Signed)
  Follow up Call-  Call back number 04/27/2019  Post procedure Call Back phone  # [phone]  Permission to leave phone message Yes  Some recent data might be hidden     Patient questions:  Phone rang then went silent. No message left.

## 2019-05-01 NOTE — Telephone Encounter (Signed)
  Follow up Call-  Call back number 04/27/2019  Post procedure Call Back phone  # [phone]  Permission to leave phone message Yes  Some recent data might be hidden     Voicemail box has not been set up

## 2019-05-03 ENCOUNTER — Encounter: Payer: Self-pay | Admitting: Gastroenterology

## 2019-05-04 ENCOUNTER — Encounter: Payer: Self-pay | Admitting: Family Medicine

## 2019-05-05 ENCOUNTER — Other Ambulatory Visit: Payer: Self-pay | Admitting: Family Medicine

## 2019-05-05 MED FILL — metFORMIN HCL 1000 MG TABS: 1000 | 90 days supply | Qty: 180 | Fill #1

## 2019-05-05 MED FILL — MELOXICAM 15 MG TABLET: 15 | 90 days supply | Qty: 90 | Fill #1

## 2019-05-05 MED FILL — IBUPROFEN 800 MG TAB: 800 | 20 days supply | Qty: 60 | Fill #2

## 2019-05-05 MED FILL — LOSARTAN POTASSIUM 100 MG T: 100 | 30 days supply | Qty: 30 | Fill #0

## 2019-05-08 MED FILL — ACCU-CHEK GUIDE TEST STRIP: 90 days supply | Qty: 100 | Fill #4

## 2019-06-09 ENCOUNTER — Other Ambulatory Visit: Payer: Self-pay | Admitting: Family Medicine

## 2019-06-09 MED FILL — LEVOTHYROXINE 137 MCG TABLE: 137 | 90 days supply | Qty: 90 | Fill #1

## 2019-06-09 MED FILL — PANTOPRAZOLE SOD DR 40 MG T: 40 | 90 days supply | Qty: 90 | Fill #0

## 2019-06-09 MED FILL — LOSARTAN POTASSIUM 100 MG T: 100 | 30 days supply | Qty: 30 | Fill #1

## 2019-06-27 MED FILL — ALLOPURINOL 100 MG TABS: 100 | 90 days supply | Qty: 90 | Fill #1

## 2019-06-30 ENCOUNTER — Other Ambulatory Visit: Payer: Self-pay | Admitting: Family Medicine

## 2019-06-30 DIAGNOSIS — Z1231 Encounter for screening mammogram for malignant neoplasm of breast: Secondary | ICD-10-CM

## 2019-07-09 MED FILL — DULoxetine HCL 30 MG CPEP: 30 | 90 days supply | Qty: 90 | Fill #1

## 2019-07-09 MED FILL — ATORVASTATIN 40 MG TABLET: 40 | 90 days supply | Qty: 90 | Fill #3

## 2019-07-09 MED FILL — LOSARTAN POTASSIUM 100 MG T: 100 | 30 days supply | Qty: 30 | Fill #2

## 2019-08-01 ENCOUNTER — Other Ambulatory Visit: Payer: Self-pay | Admitting: Family Medicine

## 2019-08-01 MED FILL — metFORMIN HCL 1000 MG TABS: 1000 | 90 days supply | Qty: 180 | Fill #0

## 2019-08-07 MED FILL — LOSARTAN POTASSIUM 100 MG T: 100 | 30 days supply | Qty: 30 | Fill #3

## 2019-08-10 ENCOUNTER — Other Ambulatory Visit: Payer: Self-pay | Admitting: Family Medicine

## 2019-08-10 MED FILL — MELOXICAM 15 MG TABLET: 15 | 90 days supply | Qty: 90 | Fill #0

## 2019-08-23 ENCOUNTER — Other Ambulatory Visit: Payer: Self-pay

## 2019-08-23 ENCOUNTER — Ambulatory Visit
Admission: RE | Admit: 2019-08-23 | Discharge: 2019-08-23 | Disposition: A | Discharge status: Home / Self Care | Payer: 59 | Source: Ambulatory Visit | Attending: Family Medicine | Admitting: Family Medicine

## 2019-08-23 DIAGNOSIS — Z1231 Encounter for screening mammogram for malignant neoplasm of breast: Secondary | ICD-10-CM

## 2019-08-28 ENCOUNTER — Other Ambulatory Visit: Payer: Self-pay | Admitting: Family Medicine

## 2019-08-28 DIAGNOSIS — E669 Obesity, unspecified: Secondary | ICD-10-CM

## 2019-08-28 DIAGNOSIS — E1169 Type 2 diabetes mellitus with other specified complication: Secondary | ICD-10-CM

## 2019-08-28 MED FILL — ACCU-CHEK GUIDE TEST STRIP: 90 days supply | Qty: 100 | Fill #0

## 2019-08-30 ENCOUNTER — Other Ambulatory Visit: Payer: Self-pay | Admitting: Family Medicine

## 2019-08-30 DIAGNOSIS — I10 Essential (primary) hypertension: Secondary | ICD-10-CM

## 2019-08-30 MED FILL — CHLORTHALIDONE 50 MG TABLET: 50 | 90 days supply | Qty: 180 | Fill #0

## 2019-08-30 MED FILL — POTASSIUM CHL ER M10 TABLET: 10 | 90 days supply | Qty: 270 | Fill #0

## 2019-08-31 ENCOUNTER — Encounter: Payer: Self-pay | Admitting: Family Medicine

## 2019-08-31 MED FILL — LOSARTAN POTASSIUM 100 MG T: 100 | 30 days supply | Qty: 30 | Fill #4

## 2019-09-01 MED ORDER — ALLOPURINOL 300 MG PO TABS: 150.0000 mg | ORAL_TABLET | Freq: Every day | ORAL | 3 refills | Status: DC

## 2019-09-01 MED FILL — ALLOPURINOL 300 MG TABS: 300 | 90 days supply | Qty: 45 | Fill #0

## 2019-09-14 ENCOUNTER — Other Ambulatory Visit: Payer: Self-pay | Admitting: Family Medicine

## 2019-09-14 MED FILL — PANTOPRAZOLE SOD DR 40 MG T: 40 | 90 days supply | Qty: 90 | Fill #1

## 2019-09-14 MED FILL — LEVOTHYROXINE 137 MCG TABLE: 137 | 90 days supply | Qty: 90 | Fill #0

## 2019-10-02 MED FILL — LOSARTAN POTASSIUM 100 MG T: 100 | 30 days supply | Qty: 30 | Fill #5

## 2019-10-13 ENCOUNTER — Other Ambulatory Visit: Payer: Self-pay | Admitting: Family Medicine

## 2019-10-13 DIAGNOSIS — G609 Hereditary and idiopathic neuropathy, unspecified: Secondary | ICD-10-CM

## 2019-10-13 MED FILL — DULoxetine HCL 30 MG CPEP: 30 | 90 days supply | Qty: 90 | Fill #0

## 2019-10-16 ENCOUNTER — Other Ambulatory Visit: Payer: Self-pay | Admitting: Family Medicine

## 2019-10-16 MED FILL — ATORVASTATIN 40 MG TABLET: 40 | 90 days supply | Qty: 90 | Fill #0

## 2019-10-29 MED FILL — metFORMIN HCL 1000 MG TABS: 1000 | 90 days supply | Qty: 180 | Fill #1

## 2019-10-30 ENCOUNTER — Other Ambulatory Visit: Payer: Self-pay | Admitting: Family Medicine

## 2019-10-30 MED FILL — LOSARTAN POTASSIUM 100 MG T: 100 | 90 days supply | Qty: 90 | Fill #0

## 2019-11-06 MED FILL — FREESTYLE LITE METER: 20 days supply | Qty: 1 | Fill #0

## 2019-11-06 MED FILL — MELOXICAM 15 MG TABLET: 15 | 90 days supply | Qty: 90 | Fill #1

## 2019-11-08 MED FILL — FREESTYLE LITE TEST STRIP: 90 days supply | Qty: 100 | Fill #0

## 2019-11-17 DIAGNOSIS — G4733 Obstructive sleep apnea (adult) (pediatric): Secondary | ICD-10-CM | POA: Diagnosis not present

## 2019-11-22 MED FILL — DOXYCYCLINE HYCLATE 100 MG: 100 | 7 days supply | Qty: 8 | Fill #0

## 2019-11-30 ENCOUNTER — Telehealth: Payer: Self-pay | Admitting: Family Medicine

## 2019-11-30 MED FILL — ALLOPURINOL 300 MG TABS: 300 | 90 days supply | Qty: 45 | Fill #1

## 2019-11-30 NOTE — Telephone Encounter (Signed)
Please call patient and have her schedule follow-up for diabetes.  It has been over 6 months and we need to get some up-to-date blood work.  Even if we just do a virtual visit in order blood work separately that is perfectly fine.  Please also see if she is gone for eye exam this year.

## 2019-12-01 NOTE — Telephone Encounter (Signed)
Called patient and advised she needs appointment for diabetes check up.  Sent to scheduling department to schedule the visit.  Her eye exam is scheduled for December 25, 2019. KG LPN

## 2019-12-11 MED FILL — LEVOTHYROXINE 137 MCG TABLE: 137 | 90 days supply | Qty: 90 | Fill #1

## 2019-12-11 MED FILL — PANTOPRAZOLE SOD DR 40 MG T: 40 | 90 days supply | Qty: 90 | Fill #2

## 2019-12-25 DIAGNOSIS — E119 Type 2 diabetes mellitus without complications: Secondary | ICD-10-CM | POA: Diagnosis not present

## 2019-12-25 DIAGNOSIS — Z135 Encounter for screening for eye and ear disorders: Secondary | ICD-10-CM | POA: Diagnosis not present

## 2019-12-25 DIAGNOSIS — H04123 Dry eye syndrome of bilateral lacrimal glands: Secondary | ICD-10-CM | POA: Diagnosis not present

## 2019-12-25 DIAGNOSIS — H5213 Myopia, bilateral: Secondary | ICD-10-CM | POA: Diagnosis not present

## 2019-12-25 LAB — HM DIABETES EYE EXAM

## 2019-12-26 ENCOUNTER — Telehealth: Payer: Self-pay | Admitting: Family Medicine

## 2019-12-26 NOTE — Telephone Encounter (Signed)
Call pt:  Overdue for diabetic follow-up.  Please schedule. Also see if has gone for eye exam this year.

## 2019-12-27 NOTE — Telephone Encounter (Signed)
Patient has been scheduled. She had her eye exam with [facility] Doctor, Dr Francia Greaves. I will send a medical records request.

## 2020-01-01 ENCOUNTER — Other Ambulatory Visit: Payer: Self-pay | Admitting: Sports Medicine

## 2020-01-01 ENCOUNTER — Other Ambulatory Visit: Payer: Self-pay | Admitting: Family Medicine

## 2020-01-01 MED FILL — CHLORTHALIDONE 50 MG TABS: 50 | 90 days supply | Qty: 180 | Fill #1

## 2020-01-01 MED FILL — IBUPROFEN 800 MG TAB: 800 | 20 days supply | Qty: 60 | Fill #0

## 2020-01-01 NOTE — Telephone Encounter (Signed)
To PCP, I have not seen her in 4 years

## 2020-01-12 MED FILL — POTASSIUM CHLORIDE CRYS ER: 10 | 90 days supply | Qty: 270 | Fill #1

## 2020-01-12 MED FILL — DULoxetine HCL 30 MG CPEP: 30 | 90 days supply | Qty: 90 | Fill #1

## 2020-01-12 MED FILL — ATORVASTATIN 40 MG TABLET: 40 | 90 days supply | Qty: 90 | Fill #1

## 2020-01-18 ENCOUNTER — Encounter: Payer: Self-pay | Admitting: Family Medicine

## 2020-01-18 ENCOUNTER — Ambulatory Visit (INDEPENDENT_AMBULATORY_CARE_PROVIDER_SITE_OTHER): Payer: 59 | Admitting: Family Medicine

## 2020-01-18 ENCOUNTER — Other Ambulatory Visit: Payer: Self-pay

## 2020-01-18 VITALS — BP 127/53 | HR 77 | Ht 66.0 in | Wt 213.0 lb

## 2020-01-18 DIAGNOSIS — Z23 Encounter for immunization: Secondary | ICD-10-CM

## 2020-01-18 DIAGNOSIS — E669 Obesity, unspecified: Secondary | ICD-10-CM

## 2020-01-18 DIAGNOSIS — E038 Other specified hypothyroidism: Secondary | ICD-10-CM

## 2020-01-18 DIAGNOSIS — Z0000 Encounter for general adult medical examination without abnormal findings: Secondary | ICD-10-CM | POA: Diagnosis not present

## 2020-01-18 DIAGNOSIS — E119 Type 2 diabetes mellitus without complications: Secondary | ICD-10-CM | POA: Diagnosis not present

## 2020-01-18 DIAGNOSIS — E1169 Type 2 diabetes mellitus with other specified complication: Secondary | ICD-10-CM | POA: Diagnosis not present

## 2020-01-18 DIAGNOSIS — G609 Hereditary and idiopathic neuropathy, unspecified: Secondary | ICD-10-CM | POA: Diagnosis not present

## 2020-01-18 DIAGNOSIS — I10 Essential (primary) hypertension: Secondary | ICD-10-CM

## 2020-01-18 DIAGNOSIS — M1A09X0 Idiopathic chronic gout, multiple sites, without tophus (tophi): Secondary | ICD-10-CM

## 2020-01-18 LAB — POCT GLYCOSYLATED HEMOGLOBIN (HGB A1C): Hemoglobin A1C: 6.3 % — AB (ref 4.0–5.6)

## 2020-01-18 NOTE — Progress Notes (Signed)
Subjective:     Alexis Mcneil is a 64 y.o. female and is here for a comprehensive physical exam. The patient reports no problems. She actually sold her house this past year and bought a trailer and some land at [facility]. She says she is planning on retiring after she turns 31 and probably moving there permanently. She is interested in getting her shingles vaccine.    Social History   Socioeconomic History  . Marital status: Single    Spouse name: Not on file  . Number of children: Not on file  . Years of education: Not on file  . Highest education level: Not on file  Occupational History  . Not on file  Tobacco Use  . Smoking status: Never Smoker  . Smokeless tobacco: Never Used  Substance and Sexual Activity  . Alcohol use: Yes    Alcohol/week: 0.0 standard drinks    Comment: occasional beer on the weekends.    . Drug use: No  . Sexual activity: Yes    Birth control/protection: Post-menopausal  Other Topics Concern  . Not on file  Social History Narrative   Lives alone in a 2 story home.  Has no children.     Works for [employer].     Education: associates degree.   Social Determinants of Health   Financial Resource Strain:   . Difficulty of Paying Living Expenses:   Food Insecurity:   . Worried About Charity fundraiser in the Last Year:   . Arboriculturist in the Last Year:   Transportation Needs:   . Film/video editor (Medical):   Marland Kitchen Lack of Transportation (Non-Medical):   Physical Activity:   . Days of Exercise per Week:   . Minutes of Exercise per Session:   Stress:   . Feeling of Stress :   Social Connections:   . Frequency of Communication with Friends and Family:   . Frequency of Social Gatherings with Friends and Family:   . Attends Religious Services:   . Active Member of Clubs or Organizations:   . Attends Archivist Meetings:   Marland Kitchen Marital Status:   Intimate Partner Violence:   . Fear of Current or Ex-Partner:   .  Emotionally Abused:   Marland Kitchen Physically Abused:   . Sexually Abused:    Health Maintenance  Topic Date Due  . HIV Screening  Never done  . FOOT EXAM  03/08/2019  . HEMOGLOBIN A1C  07/20/2020  . OPHTHALMOLOGY EXAM  12/24/2020  . PAP SMEAR-Modifier  02/11/2021  . MAMMOGRAM  08/22/2021  . COLONOSCOPY  04/26/2024  . TETANUS/TDAP  05/26/2025  . INFLUENZA VACCINE  Completed  . PNEUMOCOCCAL POLYSACCHARIDE VACCINE AGE 72-64 HIGH RISK  Completed  . Hepatitis C Screening  Completed    The following portions of the patient's history were reviewed and updated as appropriate: allergies, current medications, past family history, past medical history, past social history, past surgical history and problem list.  Review of Systems A comprehensive review of systems was negative.   Objective:    BP (!) 127/53   Pulse 77   Ht 5\' 6"  (1.676 m)   Wt 213 lb (96.6 kg)   SpO2 99%   BMI 34.38 kg/m  General appearance: alert, cooperative and appears stated age Head: Normocephalic, without obvious abnormality, atraumatic Eyes: conj clear, EOMI, PEELRA Ears: normal TM's and external ear canals both ears Nose: Nares normal. Septum midline. Mucosa normal. No drainage or sinus  tenderness. Throat: lips, mucosa, and tongue normal; teeth and gums normal Neck: no adenopathy, no carotid bruit, no JVD, supple, symmetrical, trachea midline and thyroid not enlarged, symmetric, no tenderness/mass/nodules Back: symmetric, no curvature. ROM normal. No CVA tenderness. Lungs: clear to auscultation bilaterally Breasts: normal appearance, no masses or tenderness Heart: regular rate and rhythm, S1, S2 normal, no murmur, click, rub or gallop Abdomen: soft, non-tender; bowel sounds normal; no masses,  no organomegaly Extremities: extremities normal, atraumatic, no cyanosis or edema Pulses: 2+ and symmetric Skin: Skin color, texture, turgor normal. No rashes or lesions Lymph nodes: Cervical, supraclavicular, and axillary  nodes normal. Neurologic: Alert and oriented X 3, normal strength and tone. Normal symmetric reflexes. Normal coordination and gait    Assessment:    Healthy female exam.     Plan:     See After Visit Summary for Counseling Recommendations   Keep up a regular exercise program and make sure you are eating a healthy diet Try to eat 4 servings of dairy a day, or if you are lactose intolerant take a calcium with vitamin D daily.  Your vaccines are up to date.  Mammogram and colonoscopy are both up-to-date. She has received her Covid vaccination series. First shingrix given today.    She is also overdue to have blood work updated for her gout and diabetes as well as blood pressure.  Those were labs were added today.

## 2020-01-19 ENCOUNTER — Other Ambulatory Visit (HOSPITAL_COMMUNITY)
Admission: RE | Admit: 2020-01-19 | Discharge: 2020-01-19 | Disposition: A | Discharge status: Home / Self Care | Payer: 59 | Source: Ambulatory Visit | Attending: Family Medicine | Admitting: Family Medicine

## 2020-01-19 DIAGNOSIS — E038 Other specified hypothyroidism: Secondary | ICD-10-CM | POA: Diagnosis not present

## 2020-01-19 DIAGNOSIS — E119 Type 2 diabetes mellitus without complications: Secondary | ICD-10-CM | POA: Insufficient documentation

## 2020-01-19 DIAGNOSIS — M1A09X0 Idiopathic chronic gout, multiple sites, without tophus (tophi): Secondary | ICD-10-CM | POA: Diagnosis not present

## 2020-01-19 DIAGNOSIS — I10 Essential (primary) hypertension: Secondary | ICD-10-CM | POA: Insufficient documentation

## 2020-01-19 DIAGNOSIS — Z0000 Encounter for general adult medical examination without abnormal findings: Secondary | ICD-10-CM | POA: Insufficient documentation

## 2020-01-19 LAB — LIPID PANEL
Cholesterol: 179 mg/dL (ref 0–200)
HDL: 48 mg/dL
LDL Cholesterol: 106 mg/dL — ABNORMAL HIGH (ref 0–99)
Total CHOL/HDL Ratio: 3.7 ratio
Triglycerides: 123 mg/dL
VLDL: 25 mg/dL (ref 0–40)

## 2020-01-19 LAB — COMPREHENSIVE METABOLIC PANEL
ALT: 21 U/L (ref 0–44)
AST: 19 U/L (ref 15–41)
Albumin: 4.2 g/dL (ref 3.5–5.0)
Alkaline Phosphatase: 73 U/L (ref 38–126)
Anion gap: 11 (ref 5–15)
BUN: 23 mg/dL (ref 8–23)
CO2: 29 mmol/L (ref 22–32)
Calcium: 9.3 mg/dL (ref 8.9–10.3)
Chloride: 99 mmol/L (ref 98–111)
Creatinine, Ser: 1.18 mg/dL — ABNORMAL HIGH (ref 0.44–1.00)
GFR calc Af Amer: 57 mL/min — ABNORMAL LOW (ref >60)
GFR calc non Af Amer: 49 mL/min — ABNORMAL LOW (ref >60)
Glucose, Bld: 157 mg/dL — ABNORMAL HIGH (ref 70–99)
Potassium: 3.1 mmol/L — ABNORMAL LOW (ref 3.5–5.1)
Sodium: 139 mmol/L (ref 135–145)
Total Bilirubin: 0.8 mg/dL (ref 0.3–1.2)
Total Protein: 7.2 g/dL (ref 6.5–8.1)

## 2020-01-19 LAB — CBC
HCT: 37.7 % (ref 36.0–46.0)
Hemoglobin: 12.5 g/dL (ref 12.0–15.0)
MCH: 29.8 pg (ref 26.0–34.0)
MCHC: 33.2 g/dL (ref 30.0–36.0)
MCV: 89.8 fL (ref 80.0–100.0)
Platelets: 210 10*3/uL (ref 150–400)
RBC: 4.2 MIL/uL (ref 3.87–5.11)
RDW: 14.6 % (ref 11.5–15.5)
WBC: 7.6 10*3/uL (ref 4.0–10.5)
nRBC: 0 % (ref 0.0–0.2)

## 2020-01-19 LAB — TSH: TSH: 1.864 u[IU]/mL (ref 0.350–4.500)

## 2020-01-19 LAB — URIC ACID: Uric Acid, Serum: 6.1 mg/dL (ref 2.5–7.1)

## 2020-01-24 ENCOUNTER — Encounter: Payer: Self-pay | Admitting: Neurology

## 2020-02-02 ENCOUNTER — Other Ambulatory Visit: Payer: Self-pay | Admitting: *Deleted

## 2020-02-02 ENCOUNTER — Other Ambulatory Visit: Payer: Self-pay | Admitting: Family Medicine

## 2020-02-02 MED FILL — MELOXICAM 15 MG TABLET: 15 | 90 days supply | Qty: 90 | Fill #0

## 2020-02-02 MED FILL — LOSARTAN POTASSIUM 100 MG T: 100 | 90 days supply | Qty: 90 | Fill #1

## 2020-02-09 ENCOUNTER — Other Ambulatory Visit: Payer: Self-pay | Admitting: Family Medicine

## 2020-02-09 ENCOUNTER — Encounter: Payer: Self-pay | Admitting: Family Medicine

## 2020-02-12 MED FILL — METFORMIN HCL 1000 MG TABS: 1000 | 90 days supply | Qty: 180 | Fill #0

## 2020-03-07 ENCOUNTER — Other Ambulatory Visit: Payer: Self-pay | Admitting: Family Medicine

## 2020-03-07 MED FILL — LEVOTHYROXINE 137 MCG TABLE: 137 | 90 days supply | Qty: 90 | Fill #0

## 2020-03-07 MED FILL — ALLOPURINOL 300 MG TABS: 300 | 90 days supply | Qty: 45 | Fill #2

## 2020-03-07 MED FILL — PANTOPRAZOLE SOD DR 40 MG T: 40 | 90 days supply | Qty: 90 | Fill #3

## 2020-04-12 ENCOUNTER — Other Ambulatory Visit: Payer: Self-pay | Admitting: Family Medicine

## 2020-04-12 DIAGNOSIS — G609 Hereditary and idiopathic neuropathy, unspecified: Secondary | ICD-10-CM

## 2020-04-12 MED FILL — ATORVASTATIN CALCIUM 40 MG: 40 | 90 days supply | Qty: 90 | Fill #2

## 2020-04-12 MED FILL — DULoxetine HCL 30 MG CPEP: 30 | 90 days supply | Qty: 90 | Fill #0

## 2020-04-23 ENCOUNTER — Other Ambulatory Visit: Payer: Self-pay | Admitting: Family Medicine

## 2020-04-23 MED FILL — LOSARTAN POTASSIUM 100 MG T: 100 | 90 days supply | Qty: 90 | Fill #0

## 2020-04-23 MED FILL — CHLORTHALIDONE 50 MG TABS: 50 | 90 days supply | Qty: 180 | Fill #2

## 2020-05-10 MED FILL — METFORMIN HCL 1000 MG TABS: 1000 | 90 days supply | Qty: 180 | Fill #1

## 2020-05-20 ENCOUNTER — Ambulatory Visit (INDEPENDENT_AMBULATORY_CARE_PROVIDER_SITE_OTHER): Payer: 59 | Admitting: Family Medicine

## 2020-05-20 ENCOUNTER — Other Ambulatory Visit: Payer: Self-pay

## 2020-05-20 ENCOUNTER — Encounter: Payer: Self-pay | Admitting: Family Medicine

## 2020-05-20 VITALS — BP 134/58 | HR 80 | Ht 66.0 in | Wt 216.0 lb

## 2020-05-20 DIAGNOSIS — M1A09X0 Idiopathic chronic gout, multiple sites, without tophus (tophi): Secondary | ICD-10-CM

## 2020-05-20 DIAGNOSIS — L84 Corns and callosities: Secondary | ICD-10-CM | POA: Diagnosis not present

## 2020-05-20 DIAGNOSIS — I10 Essential (primary) hypertension: Secondary | ICD-10-CM

## 2020-05-20 DIAGNOSIS — R61 Generalized hyperhidrosis: Secondary | ICD-10-CM

## 2020-05-20 DIAGNOSIS — E1169 Type 2 diabetes mellitus with other specified complication: Secondary | ICD-10-CM

## 2020-05-20 DIAGNOSIS — E669 Obesity, unspecified: Secondary | ICD-10-CM

## 2020-05-20 DIAGNOSIS — G609 Hereditary and idiopathic neuropathy, unspecified: Secondary | ICD-10-CM

## 2020-05-20 LAB — POCT GLYCOSYLATED HEMOGLOBIN (HGB A1C): Hemoglobin A1C: 6.8 % — AB (ref 4.0–5.6)

## 2020-05-20 NOTE — Patient Instructions (Signed)
Try increasing your Cymbalta to 2 tabs daily.

## 2020-05-20 NOTE — Assessment & Plan Note (Signed)
Well controlled. Continue current regimen. Follow up in  3-4 mo  

## 2020-05-20 NOTE — Assessment & Plan Note (Signed)
Now on Allopurinol. Will recheck uric acid. We had adjusted dose recently.

## 2020-05-20 NOTE — Progress Notes (Signed)
Established Patient Office Visit  Subjective:  Patient ID: Alexis Mcneil, female    DOB: [birth year]  Age: 64 y.o. MRN: [id number]  CC:  Chief Complaint  Patient presents with  . Diabetes    HPI Alexis Mcneil presents for F/U   Diabetes - no hypoglycemic events. No wounds or sores that are not healing well. No increased thirst or urination. Checking glucose at home. Taking medications as prescribed without any side effects.  F/U gout - no flares.    F/U Neuropathy - feels it is getting wrose.  Now getting sharp stinging in the big toes. Lasts a few seconds.  Getting tingling on the tops of her fee and even above her ankles.  She is getting callouses on the sides of her distal feel.  Says her feet feel very swollen when she first gets out of bed even though they don't look swollen. By the end of the day has some swelling between the 1st and 2nd MT heads on both feet. Does have chronic low back pain as well but has been manageable.    C/o of sweats and flushing. Has been present for years.  Says will mostly sweat over her forehead.  Drips at times,    Past Medical History:  Diagnosis Date  . Anemia   . Arthritis    KNEE,SHOULDERS  . Diabetes mellitus without complication ([place])   . GERD (gastroesophageal reflux disease)   . Heart murmur   . Hyperlipidemia   . Hypertension   . IFG (impaired fasting glucose)   . Neuromuscular disorder (HCC)    NEUROPATHY FEET  . OSA (obstructive sleep apnea)    CPAP  . Sleep apnea    CPAP  . Thyroid disease     Past Surgical History:  Procedure Laterality Date  . COLONOSCOPY    . endometrial polyp     January 2014  . mouth palate surgery repair  1961   large tear  . ROOT CANEL     2 WEEKS AGO  . rt knee surgery arthroscopy  2009   torn meniscus/medial  . torn ligament shoulder  1975   repaired    Family History  Problem Relation Age of Onset  . Colon cancer Father 94       colon/ polyps  . Heart attack Father   . Stroke  Father   . Hyperlipidemia Mother        Living  . Hypertension Mother   . Colon cancer Paternal Grandmother   . Stroke Paternal Grandmother   . Heart attack Paternal Grandmother        heart attack  . Diabetes Other        grandmother  . Supraventricular tachycardia Sister   . Esophageal cancer Neg Hx   . Stomach cancer Neg Hx   . Rectal cancer Neg Hx     Social History   Socioeconomic History  . Marital status: Single    Spouse name: Not on file  . Number of children: Not on file  . Years of education: Not on file  . Highest education level: Not on file  Occupational History  . Not on file  Tobacco Use  . Smoking status: Never Smoker  . Smokeless tobacco: Never Used  Vaping Use  . Vaping Use: Never used  Substance and Sexual Activity  . Alcohol use: Yes    Alcohol/week: 0.0 standard drinks    Comment: occasional beer on the weekends.    . Drug  use: No  . Sexual activity: Yes    Birth control/protection: Post-menopausal  Other Topics Concern  . Not on file  Social History Narrative   Lives alone in a 2 story home.  Has no children.     Works for [employer].     Education: associates degree.   Social Determinants of Health   Financial Resource Strain:   . Difficulty of Paying Living Expenses:   Food Insecurity:   . Worried About Charity fundraiser in the Last Year:   . Arboriculturist in the Last Year:   Transportation Needs:   . Film/video editor (Medical):   Marland Kitchen Lack of Transportation (Non-Medical):   Physical Activity:   . Days of Exercise per Week:   . Minutes of Exercise per Session:   Stress:   . Feeling of Stress :   Social Connections:   . Frequency of Communication with Friends and Family:   . Frequency of Social Gatherings with Friends and Family:   . Attends Religious Services:   . Active Member of Clubs or Organizations:   . Attends Archivist Meetings:   Marland Kitchen Marital Status:   Intimate Partner Violence:   . Fear of  Current or Ex-Partner:   . Emotionally Abused:   Marland Kitchen Physically Abused:   . Sexually Abused:     Outpatient Medications Prior to Visit  Medication Sig Dispense Refill  . glucose blood test strip FOR CHECKING BLOOD SUGAR ONCE DAILY    . allopurinol (ZYLOPRIM) 300 MG tablet Take 0.5 tablets (150 mg total) by mouth daily. 45 tablet 3  . AMBULATORY NON FORMULARY MEDICATION Medication Name: CPAP machine and supplies with humidifier.  Please set her CPAP to 9 cm water pressure. Then download after one week so can review her apneas on 9 cm water pressure.  [facility]. Dx. OSA G47.33 1 vial 0  . AMBULATORY NON FORMULARY MEDICATION     . atorvastatin (LIPITOR) 40 MG tablet TAKE 1 TABLET BY MOUTH ONCE DAILY AT 6PM 90 tablet 3  . Blood Glucose Monitoring Suppl (FREESTYLE LITE) DEVI     . chlorthalidone (HYGROTON) 50 MG tablet TAKE 2 TABLETS BY MOUTH DAILY. 180 tablet 3  . DULoxetine (CYMBALTA) 30 MG capsule TAKE 1 CAPSULE (30 MG TOTAL) BY MOUTH DAILY. 90 capsule 1  . ibuprofen (ADVIL) 800 MG tablet TAKE 1 TABLET BY MOUTH 3 TIMES DAILY AS NEEDED 60 tablet 2  . Lancets Misc. (ACCU-CHEK FASTCLIX LANCET) KIT Use to check blood sugar once daily. Dx code: E11.69 1 kit 1  . levothyroxine (SYNTHROID) 137 MCG tablet TAKE 1 TABLET BY MOUTH ONCE DAILY BEFORE BREAKFAST 90 tablet 1  . losartan (COZAAR) 100 MG tablet TAKE 1 TABLET BY MOUTH DAILY. 90 tablet 1  . meloxicam (MOBIC) 15 MG tablet TAKE 1 TABLET BY MOUTH ONCE DAILY 90 tablet 1  . metFORMIN (GLUCOPHAGE) 1000 MG tablet TAKE 1 TABLET BY MOUTH TWICE DAILY WITH MEALS 180 tablet 1  . pantoprazole (PROTONIX) 40 MG tablet TAKE 1 TABLET BY MOUTH ONCE DAILY. 90 tablet 3  . potassium chloride (KLOR-CON) 10 MEQ tablet TAKE 1 TABLET BY MOUTH THREE TIMES DAILY 270 tablet 3  . ACCU-CHEK GUIDE test strip FOR CHECKING BLOOD SUGAR ONCE DAILY 100 strip 12  . glucose blood test strip     . 0.9 %  sodium chloride infusion      No facility-administered medications prior to  visit.    Allergies  Allergen  Reactions  . Niacin And Related Nausea And Vomiting    Passed out and threw up  . Gabapentin Other (See Comments)    Vision changes    ROS Review of Systems    Objective:    Physical Exam Constitutional:      Appearance: She is well-developed.  HENT:     Head: Normocephalic and atraumatic.  Cardiovascular:     Rate and Rhythm: Normal rate and regular rhythm.     Heart sounds: Normal heart sounds.  Pulmonary:     Effort: Pulmonary effort is normal.     Breath sounds: Normal breath sounds.  Musculoskeletal:     Comments: DP pulse 1+ bilat. Unable to feel post tib.  No discoloration, redness or swelling. Callus formed the ball of both feet, medially and laterally  Skin:    General: Skin is warm and dry.  Neurological:     Mental Status: She is alert and oriented to person, place, and time.  Psychiatric:        Behavior: Behavior normal.     BP (!) 134/58   Pulse 80   Ht '5\' 6"'  (1.676 m)   Wt 216 lb (98 kg)   SpO2 98%   BMI 34.86 kg/m  Wt Readings from Last 3 Encounters:  05/20/20 216 lb (98 kg)  01/18/20 213 lb (96.6 kg)  04/27/19 217 lb (98.4 kg)     Health Maintenance Due  Topic Date Due  . HIV Screening  Never done    There are no preventive care reminders to display for this patient.  Lab Results  Component Value Date   TSH 1.864 01/19/2020   Lab Results  Component Value Date   WBC 7.6 01/19/2020   HGB 12.5 01/19/2020   HCT 37.7 01/19/2020   MCV 89.8 01/19/2020   PLT 210 01/19/2020   Lab Results  Component Value Date   NA 139 01/19/2020   K 3.1 (L) 01/19/2020   CO2 29 01/19/2020   GLUCOSE 157 (H) 01/19/2020   BUN 23 01/19/2020   CREATININE 1.18 (H) 01/19/2020   BILITOT 0.8 01/19/2020   ALKPHOS 73 01/19/2020   AST 19 01/19/2020   ALT 21 01/19/2020   PROT 7.2 01/19/2020   ALBUMIN 4.2 01/19/2020   CALCIUM 9.3 01/19/2020   ANIONGAP 11 01/19/2020   Lab Results  Component Value Date   CHOL 179  01/19/2020   Lab Results  Component Value Date   HDL 48 01/19/2020   Lab Results  Component Value Date   LDLCALC 106 (H) 01/19/2020   Lab Results  Component Value Date   TRIG 123 01/19/2020   Lab Results  Component Value Date   CHOLHDL 3.7 01/19/2020   Lab Results  Component Value Date   HGBA1C 6.8 (A) 05/20/2020      Assessment & Plan:   Problem List Items Addressed This Visit      Cardiovascular and Mediastinum   HYPERTENSION, BENIGN ESSENTIAL    Well controlled. Continue current regimen. Follow up in  3-4 mo      Relevant Orders   BASIC METABOLIC PANEL WITH GFR   Uric acid     Endocrine   Diabetes mellitus type 2 in obese ([place]) - Primary    Well controlled. Continue current regimen. Follow up in  3-4 mo      Relevant Orders   POCT glycosylated hemoglobin (Hb A1C) (Completed)   BASIC METABOLIC PANEL WITH GFR   Uric acid     Nervous  and Auditory   Hereditary and idiopathic peripheral neuropathy    Has up coming appt with Neurology for consultation. Discussed trial of inc dose of Cymbalta.        Relevant Orders   BASIC METABOLIC PANEL WITH GFR   Uric acid     Musculoskeletal and Integument   Pre-ulcerative corn or callous    Consider Podiatry referral.         Other   Gout    Now on Allopurinol. Will recheck uric acid. We had adjusted dose recently.       Excessive sweating    I think just physiologic.  Asked her to check BP when this happens.  If elevated then consider evaluation for pheochromocytoma.           No orders of the defined types were placed in this encounter.   Follow-up: Return in about 3 months (around 08/20/2020) for Diabetes follow-up.    Beatrice Lecher, MD

## 2020-05-20 NOTE — Assessment & Plan Note (Signed)
Consider Podiatry referral.

## 2020-05-20 NOTE — Assessment & Plan Note (Signed)
Has up coming appt with Neurology for consultation. Discussed trial of inc dose of Cymbalta.

## 2020-05-20 NOTE — Assessment & Plan Note (Signed)
I think just physiologic.  Asked her to check BP when this happens.  If elevated then consider evaluation for pheochromocytoma.

## 2020-05-22 ENCOUNTER — Other Ambulatory Visit (HOSPITAL_COMMUNITY)
Admit: 2020-05-22 | Discharge: 2020-05-22 | Disposition: A | Discharge status: Home / Self Care | Payer: 59 | Source: Ambulatory Visit | Attending: Family Medicine | Admitting: Family Medicine

## 2020-05-22 DIAGNOSIS — E669 Obesity, unspecified: Secondary | ICD-10-CM | POA: Insufficient documentation

## 2020-05-22 DIAGNOSIS — G609 Hereditary and idiopathic neuropathy, unspecified: Secondary | ICD-10-CM | POA: Insufficient documentation

## 2020-05-22 DIAGNOSIS — I10 Essential (primary) hypertension: Secondary | ICD-10-CM | POA: Insufficient documentation

## 2020-05-22 DIAGNOSIS — E1169 Type 2 diabetes mellitus with other specified complication: Secondary | ICD-10-CM | POA: Diagnosis not present

## 2020-05-22 LAB — BASIC METABOLIC PANEL
Anion gap: 14 (ref 5–15)
BUN: 26 mg/dL — ABNORMAL HIGH (ref 8–23)
CO2: 27 mmol/L (ref 22–32)
Calcium: 9 mg/dL (ref 8.9–10.3)
Chloride: 98 mmol/L (ref 98–111)
Creatinine, Ser: 1.24 mg/dL — ABNORMAL HIGH (ref 0.44–1.00)
GFR calc Af Amer: 54 mL/min — ABNORMAL LOW (ref >60)
GFR calc non Af Amer: 46 mL/min — ABNORMAL LOW (ref >60)
Glucose, Bld: 154 mg/dL — ABNORMAL HIGH (ref 70–99)
Potassium: 3.2 mmol/L — ABNORMAL LOW (ref 3.5–5.1)
Sodium: 139 mmol/L (ref 135–145)

## 2020-05-22 LAB — URIC ACID: Uric Acid, Serum: 5.7 mg/dL (ref 2.5–7.1)

## 2020-05-22 MED FILL — ALLOPURINOL 300 MG TABS: 300 | 90 days supply | Qty: 45 | Fill #3

## 2020-05-22 MED FILL — MELOXICAM 15 MG TABLET: 15 | 90 days supply | Qty: 90 | Fill #1

## 2020-05-24 ENCOUNTER — Other Ambulatory Visit: Payer: Self-pay | Admitting: Family Medicine

## 2020-05-24 MED ORDER — POTASSIUM CHLORIDE CRYS ER 20 MEQ PO TBCR: 20.0000 meq | EXTENDED_RELEASE_TABLET | Freq: Two times a day (BID) | ORAL | 3 refills | Status: DC

## 2020-05-24 MED FILL — POTASSIUM CHLORIDE CRYS ER: 20 | 90 days supply | Qty: 180 | Fill #0

## 2020-06-03 ENCOUNTER — Ambulatory Visit: Payer: 59 | Admitting: Neurology

## 2020-06-07 ENCOUNTER — Other Ambulatory Visit: Payer: Self-pay

## 2020-06-07 ENCOUNTER — Ambulatory Visit (INDEPENDENT_AMBULATORY_CARE_PROVIDER_SITE_OTHER): Payer: 59 | Admitting: Neurology

## 2020-06-07 ENCOUNTER — Encounter: Payer: Self-pay | Admitting: Neurology

## 2020-06-07 VITALS — BP 122/67 | HR 81 | Ht 66.0 in | Wt 218.0 lb

## 2020-06-07 DIAGNOSIS — G5603 Carpal tunnel syndrome, bilateral upper limbs: Secondary | ICD-10-CM

## 2020-06-07 DIAGNOSIS — M4306 Spondylolysis, lumbar region: Secondary | ICD-10-CM

## 2020-06-07 DIAGNOSIS — E1140 Type 2 diabetes mellitus with diabetic neuropathy, unspecified: Secondary | ICD-10-CM | POA: Diagnosis not present

## 2020-06-07 NOTE — Patient Instructions (Addendum)
Continue Cymbalta 60mg  daily Check feet daily, extra caution on uneven ground Use grab bars in the shower If your symptoms get worse, please come back and see me

## 2020-06-07 NOTE — Progress Notes (Signed)
[facility] Clinic Note - Initial Visit   Date: 06/07/20  Alexis Mcneil MRN: [id number] DOB: 1956-02-06   Dear Dr. Madilyn Fireman:  Thank you for your kind referral of JARIANA Mcneil for consultation of neuropathy. Although her history is well known to you, please allow [place] to reiterate it for the purpose of our medical record. The patient was accompanied to the clinic by self.     History of Present Illness: Alexis Mcneil is a 64 y.o. right-handed female with diabetes mellitus, hypertension, hyperlipidemia, hypothyroidism, and OSA presenting for evaluation of neuropathy.  She was last seen for the same complaints in 2017 at which time she underwent electrodiagnostic testing which did not show large fiber neuropathy.  She was noted to have mild to moderate left carpal tunnel syndrome.  Since this time, she was doing relatively well, however over the past few months, she began having worsening pain in the feet.  She has numbness which has extended into the soles of the feet with stabbing pain. Her PCP increased her Cymbalta to 41m daily about two weeks ago, which has significantly helped pain.  She reports going to the beach and unintentionally suffered a cut to the sole of her right foot, which she did not feet.  It is healing well.  Her diabetes is well-controlled, last HbA1c 6.8.  Out-side paper records, electronic medical record, and images have been reviewed where available and summarized as:  Lab Results  Component Value Date   HGBA1C 6.8 (A) 05/20/2020   Lab Results  Component Value Date   VITAMINB12 311 03/19/2016   Lab Results  Component Value Date   TSH 1.864 01/19/2020   Lab Results  Component Value Date   ESRSEDRATE 39 (H) 03/19/2016   NCS/EMG of the left side 04/07/2016: 1. Left median neuropathy at or distal to the wrist, consistent with the clinical diagnosis of carpal tunnel syndrome. Overall, these findings are mild-to-moderate in degree  electrically. 2. There is no evidence of a large fiber generalized sensorimotor polyneuropathy or lumbosacral radiculopathy affecting the left lower extremity.  A small fiber neuropathy cannot be excluded by this study.  Past Medical History:  Diagnosis Date  . Anemia   . Arthritis    KNEE,SHOULDERS  . [place]   . GERD (gastroesophageal reflux disease)   . Heart murmur   . Hyperlipidemia   . Hypertension   . IFG (impaired fasting glucose)   . Neuromuscular disorder (HCC)    NEUROPATHY FEET  . OSA (obstructive sleep apnea)    CPAP  . Sleep apnea    CPAP  . Thyroid disease     Past Surgical History:  Procedure Laterality Date  . COLONOSCOPY    . endometrial polyp     January 2014  . mouth palate surgery repair  1961   large tear  . ROOT CANEL     2 WEEKS AGO  . rt knee surgery arthroscopy  2009   torn meniscus/medial  . torn ligament shoulder  1975   repaired     Medications:  Outpatient Encounter Medications as of 06/07/2020  Medication Sig Note  . allopurinol (ZYLOPRIM) 300 MG tablet Take 0.5 tablets (150 mg total) by mouth daily.   . AMBULATORY NON FORMULARY MEDICATION Medication Name: CPAP machine and supplies with humidifier.  Please set her CPAP to 9 cm water pressure. Then download after one week so can review her apneas on 9 cm water pressure.  [facility].  Dx. OSA G47.33   . atorvastatin (LIPITOR) 40 MG tablet TAKE 1 TABLET BY MOUTH ONCE DAILY AT 6PM   . Blood Glucose Monitoring Suppl (FREESTYLE LITE) DEVI    . chlorthalidone (HYGROTON) 50 MG tablet TAKE 2 TABLETS BY MOUTH DAILY.   . DULoxetine (CYMBALTA) 30 MG capsule TAKE 1 CAPSULE (30 MG TOTAL) BY MOUTH DAILY. 06/07/2020: Taking 60 mg daily  . glucose blood test strip FOR CHECKING BLOOD SUGAR ONCE DAILY   . ibuprofen (ADVIL) 800 MG tablet TAKE 1 TABLET BY MOUTH 3 TIMES DAILY AS NEEDED   . Lancets Misc. (ACCU-CHEK FASTCLIX LANCET) KIT Use to check blood sugar once daily.  Dx code: E11.69   . levothyroxine (SYNTHROID) 137 MCG tablet TAKE 1 TABLET BY MOUTH ONCE DAILY BEFORE BREAKFAST   . losartan (COZAAR) 100 MG tablet TAKE 1 TABLET BY MOUTH DAILY.   . meloxicam (MOBIC) 15 MG tablet TAKE 1 TABLET BY MOUTH ONCE DAILY   . metFORMIN (GLUCOPHAGE) 1000 MG tablet TAKE 1 TABLET BY MOUTH TWICE DAILY WITH MEALS   . pantoprazole (PROTONIX) 40 MG tablet TAKE 1 TABLET BY MOUTH ONCE DAILY.   Marland Kitchen potassium chloride (KLOR-CON) 20 MEQ tablet Take 1 tablet (20 mEq total) by mouth 2 (two) times daily.   . AMBULATORY NON FORMULARY MEDICATION     No facility-administered encounter medications on file as of 06/07/2020.    Allergies:  Allergies  Allergen Reactions  . Niacin And Related Nausea And Vomiting    Passed out and threw up  . Gabapentin Other (See Comments)    Vision changes    Family History: Family History  Problem Relation Age of Onset  . Colon cancer Father 55       colon/ polyps  . Heart attack Father   . Stroke Father   . Hyperlipidemia Mother        Living  . Hypertension Mother   . Colon cancer Paternal Grandmother   . Stroke Paternal Grandmother   . Heart attack Paternal Grandmother        heart attack  . Diabetes Other        grandmother  . Supraventricular tachycardia Sister   . Esophageal cancer Neg Hx   . Stomach cancer Neg Hx   . Rectal cancer Neg Hx     Social History: Social History   Tobacco Use  . Smoking status: Never Smoker  . Smokeless tobacco: Never Used  Vaping Use  . Vaping Use: Never used  Substance Use Topics  . Alcohol use: Yes    Alcohol/week: 0.0 standard drinks    Comment: occasional beer on the weekends.    . Drug use: No   Social History   Social History Narrative   Lives alone in a 2 story home.  Has no children.     Works for [employer].     Education: associates degree.    Vital Signs:  BP 122/67   Pulse 81   Ht _0  (1.676 m)   Wt (!) 218 lb (98.9 kg)   SpO2 98%   BMI 35.19 kg/m     Neurological Exam: MENTAL STATUS including orientation to time, place, person, recent and remote memory, attention span and concentration, language, and fund of knowledge is normal.  Speech is not dysarthric.  CRANIAL NERVES: II:  No visual field defects.     III-IV-VI: Pupils equal round and reactive to light.  Normal conjugate, extra-ocular eye movements in all directions of gaze.  No  nystagmus.  No ptosis.   V:  Normal facial sensation.    VII:  Normal facial symmetry VIII:  Normal hearing and vestibular function.    MOTOR:  No atrophy, fasciculations or abnormal movements.  No pronator drift.   Upper Extremity:  Right  Left  Deltoid  5/5   5/5   Biceps  5/5   5/5   Triceps  5/5   5/5   Infraspinatus 5/5  5/5  Medial pectoralis 5/5  5/5  Wrist extensors  5/5   5/5   Wrist flexors  5/5   5/5   Finger extensors  5/5   5/5   Finger flexors  5/5   5/5   Dorsal interossei  5/5   5/5   Abductor pollicis  5/5   5/5   Tone (Ashworth scale)  0  0   Lower Extremity:  Right  Left  Hip flexors  5/5   5/5   Hip extensors  5/5   5/5   Adductor 5/5  5/5  Abductor 5/5  5/5  Knee flexors  5/5   5/5   Knee extensors  5/5   5/5   Dorsiflexors  5/5   5/5   Plantarflexors  5/5   5/5   Toe extensors  5/5   5/5   Toe flexors  5/5   5/5   Tone (Ashworth scale)  0  0   MSRs:  Right        Left                  brachioradialis 2+  2+  biceps 2+  2+  triceps 2+  2+  patellar 2+  2+  ankle jerk 2+  2+  Hoffman no  no  plantar response down  down   SENSORY:  Absent vibration at the great toe, reduced pinprick and temperature over the dorsum of the feet, worse on the right.  Romberg's sign present.  COORDINATION/GAIT: Normal finger-to- nose-finger.  Intact rapid alternating movements bilaterally.  Gait narrow based and stable.  Unsteady with tandem gait   IMPRESSION: 1. Bilateral feet paresthesia, most likely due to diabetic neuropathy (suspected small fiber).  NCS is normal, history  is very classic.  Discussed that even with well-controlled blood glucose, neuropathy can occur.  I praised her for doing so well with her HbA1c and encouraged her to continue.   - Pain is well-controlled on Cymbalta 21m daily  - Patient educated on daily foot inspection, fall prevention, and safety precautions around the home.  2. Left >> right hand paresthesias, most consistent with CTS  - NCS/EMG if symptoms get worse  - Continue to use wrist splint  3.  Lumbosacral canal stenosis at L4-5, asymptomatic  Return to clinic as needed  Thank you for allowing me to participate in patient's care.  If I can answer any additional questions, I would be pleased to do so.    Sincerely,    Esraa Seres K. PPosey Pronto DO

## 2020-06-08 ENCOUNTER — Encounter: Payer: Self-pay | Admitting: Family Medicine

## 2020-06-08 DIAGNOSIS — G609 Hereditary and idiopathic neuropathy, unspecified: Secondary | ICD-10-CM

## 2020-06-10 MED ORDER — DULOXETINE HCL 60 MG PO CPEP: 60.0000 mg | ORAL_CAPSULE | Freq: Every day | ORAL | 0 refills | Status: DC

## 2020-06-10 MED FILL — DULOXETINE HCL 60 MG CPEP: 60 | 90 days supply | Qty: 90 | Fill #0

## 2020-06-10 NOTE — Telephone Encounter (Signed)
RX pended for 60 mg capsules   Last OV states : Return in about 3 months (around 08/20/2020) for Diabetes follow-up. Try increasing your Cymbalta to 2 tabs daily.

## 2020-06-18 ENCOUNTER — Other Ambulatory Visit: Payer: Self-pay | Admitting: Family Medicine

## 2020-06-18 MED FILL — LEVOTHYROXINE 137 MCG TABLE: 137 | 90 days supply | Qty: 90 | Fill #1

## 2020-06-18 MED FILL — PANTOPRAZOLE SOD DR 40 MG T: 40 | 90 days supply | Qty: 90 | Fill #0

## 2020-07-17 MED FILL — LOSARTAN POTASSIUM 100 MG T: 100 | 90 days supply | Qty: 90 | Fill #1

## 2020-07-17 MED FILL — ATORVASTATIN CALCIUM 40 MG: 40 | 90 days supply | Qty: 90 | Fill #3

## 2020-07-22 ENCOUNTER — Other Ambulatory Visit: Payer: Self-pay | Admitting: Family Medicine

## 2020-07-22 DIAGNOSIS — Z1231 Encounter for screening mammogram for malignant neoplasm of breast: Secondary | ICD-10-CM

## 2020-07-31 MED FILL — CHLORTHALIDONE 50 MG TABS: 50 | 90 days supply | Qty: 180 | Fill #3

## 2020-08-05 ENCOUNTER — Encounter: Payer: Self-pay | Admitting: Family Medicine

## 2020-08-07 ENCOUNTER — Other Ambulatory Visit: Payer: Self-pay | Admitting: Family Medicine

## 2020-08-07 MED FILL — METFORMIN HCL 1000 MG TABS: 1000 | 90 days supply | Qty: 180 | Fill #0

## 2020-08-20 ENCOUNTER — Ambulatory Visit: Payer: 59 | Admitting: Family Medicine

## 2020-08-20 MED FILL — MELOXICAM 15 MG TABLET: 15 | 90 days supply | Qty: 90 | Fill #0

## 2020-08-23 ENCOUNTER — Other Ambulatory Visit: Payer: Self-pay

## 2020-08-23 ENCOUNTER — Ambulatory Visit
Admission: RE | Admit: 2020-08-23 | Discharge: 2020-08-23 | Disposition: A | Discharge status: Home / Self Care | Payer: 59 | Source: Ambulatory Visit | Attending: Family Medicine | Admitting: Family Medicine

## 2020-08-23 DIAGNOSIS — Z1231 Encounter for screening mammogram for malignant neoplasm of breast: Secondary | ICD-10-CM | POA: Diagnosis not present

## 2020-09-03 ENCOUNTER — Ambulatory Visit: Payer: 59 | Admitting: Family Medicine

## 2020-09-05 ENCOUNTER — Other Ambulatory Visit: Payer: Self-pay | Admitting: Family Medicine

## 2020-09-05 ENCOUNTER — Ambulatory Visit (INDEPENDENT_AMBULATORY_CARE_PROVIDER_SITE_OTHER): Payer: 59 | Admitting: Family Medicine

## 2020-09-05 ENCOUNTER — Encounter: Payer: Self-pay | Admitting: Family Medicine

## 2020-09-05 VITALS — BP 114/48 | HR 78 | Ht 66.0 in | Wt 215.0 lb

## 2020-09-05 DIAGNOSIS — E876 Hypokalemia: Secondary | ICD-10-CM | POA: Diagnosis not present

## 2020-09-05 DIAGNOSIS — E669 Obesity, unspecified: Secondary | ICD-10-CM

## 2020-09-05 DIAGNOSIS — E1169 Type 2 diabetes mellitus with other specified complication: Secondary | ICD-10-CM | POA: Diagnosis not present

## 2020-09-05 DIAGNOSIS — G609 Hereditary and idiopathic neuropathy, unspecified: Secondary | ICD-10-CM

## 2020-09-05 DIAGNOSIS — E119 Type 2 diabetes mellitus without complications: Secondary | ICD-10-CM

## 2020-09-05 DIAGNOSIS — I10 Essential (primary) hypertension: Secondary | ICD-10-CM

## 2020-09-05 LAB — POCT GLYCOSYLATED HEMOGLOBIN (HGB A1C): Hemoglobin A1C: 7.1 % — AB (ref 4.0–5.6)

## 2020-09-05 MED ORDER — CHLORTHALIDONE 50 MG PO TABS: 50.0000 mg | ORAL_TABLET | Freq: Every day | ORAL | 1 refills | Status: DC

## 2020-09-05 MED FILL — POTASSIUM CHLORIDE CRYS ER: 20 | 90 days supply | Qty: 180 | Fill #1

## 2020-09-05 MED FILL — DULOXETINE HCL 60 MG CPEP: 60 | 90 days supply | Qty: 90 | Fill #0

## 2020-09-05 MED FILL — ALLOPURINOL 300 MG TABS: 300 | 90 days supply | Qty: 45 | Fill #0

## 2020-09-05 NOTE — Progress Notes (Signed)
Established Patient Office Visit  Subjective:  Patient ID: Alexis Mcneil, female    DOB: Oct 08, 1956  Age: 64 y.o. MRN: [id number]  CC:  Chief Complaint  Patient presents with  . Diabetes    HPI DORALENE GLANZ presents for   Diabetes - no hypoglycemic events. No wounds or sores that are not healing well. No increased thirst or urination. Checking glucose at home. Taking medications as prescribed without any side effects.  She admits she is not been doing the best with her diet lately she has been doing some traveling and visiting with her mom.  She feels like when she retires she will be able to do much better and being consistent with her diet.  .Hypertension- Pt denies chest pain, SOB, dizziness, or heart palpitations.  Taking meds as directed w/o problems.  Denies medication side effects.  She hasn't had any lower extremity swelling in quite some time she is been alternating her chlorthalidone with 1 tab daily and then 2 tabs the next day      Past Medical History:  Diagnosis Date  . Anemia   . Arthritis    KNEE,SHOULDERS  . Diabetes mellitus without complication ([place])   . GERD (gastroesophageal reflux disease)   . Heart murmur   . Hyperlipidemia   . Hypertension   . IFG (impaired fasting glucose)   . Neuromuscular disorder (HCC)    NEUROPATHY FEET  . OSA (obstructive sleep apnea)    CPAP  . Sleep apnea    CPAP  . Thyroid disease     Past Surgical History:  Procedure Laterality Date  . COLONOSCOPY    . endometrial polyp     January 2014  . mouth palate surgery repair  1961   large tear  . ROOT CANEL     2 WEEKS AGO  . rt knee surgery arthroscopy  2009   torn meniscus/medial  . torn ligament shoulder  1975   repaired    Family History  Problem Relation Age of Onset  . Colon cancer Father 66       colon/ polyps  . Heart attack Father   . Stroke Father   . Hyperlipidemia Mother        Living  . Hypertension Mother   . Colon cancer Paternal Grandmother    . Stroke Paternal Grandmother   . Heart attack Paternal Grandmother        heart attack  . Diabetes Other        grandmother  . Supraventricular tachycardia Sister   . Esophageal cancer Neg Hx   . Stomach cancer Neg Hx   . Rectal cancer Neg Hx   . Breast cancer Neg Hx     Social History   Socioeconomic History  . Marital status: Single    Spouse name: Not on file  . Number of children: Not on file  . Years of education: Not on file  . Highest education level: Not on file  Occupational History  . Not on file  Tobacco Use  . Smoking status: Never Smoker  . Smokeless tobacco: Never Used  Vaping Use  . Vaping Use: Never used  Substance and Sexual Activity  . Alcohol use: Yes    Alcohol/week: 0.0 standard drinks    Comment: occasional beer on the weekends.    . Drug use: No  . Sexual activity: Yes    Birth control/protection: Post-menopausal  Other Topics Concern  . Not on file  Social History Narrative  Lives alone in a 2 story home.  Has no children.     Works for [employer].     Education: associates degree.   Social Determinants of Health   Financial Resource Strain:   . Difficulty of Paying Living Expenses: Not on file  Food Insecurity:   . Worried About Charity fundraiser in the Last Year: Not on file  . Ran Out of Food in the Last Year: Not on file  Transportation Needs:   . Lack of Transportation (Medical): Not on file  . Lack of Transportation (Non-Medical): Not on file  Physical Activity:   . Days of Exercise per Week: Not on file  . Minutes of Exercise per Session: Not on file  Stress:   . Feeling of Stress : Not on file  Social Connections:   . Frequency of Communication with Friends and Family: Not on file  . Frequency of Social Gatherings with Friends and Family: Not on file  . Attends Religious Services: Not on file  . Active Member of Clubs or Organizations: Not on file  . Attends Archivist Meetings: Not on file  .  Marital Status: Not on file  Intimate Partner Violence:   . Fear of Current or Ex-Partner: Not on file  . Emotionally Abused: Not on file  . Physically Abused: Not on file  . Sexually Abused: Not on file    Outpatient Medications Prior to Visit  Medication Sig Dispense Refill  . allopurinol (ZYLOPRIM) 300 MG tablet TAKE 1/2 TABLET BY MOUTH DAILY 45 tablet 3  . AMBULATORY NON FORMULARY MEDICATION Medication Name: CPAP machine and supplies with humidifier.  Please set her CPAP to 9 cm water pressure. Then download after one week so can review her apneas on 9 cm water pressure.  [facility]. Dx. OSA G47.33 1 vial 0  . AMBULATORY NON FORMULARY MEDICATION     . atorvastatin (LIPITOR) 40 MG tablet TAKE 1 TABLET BY MOUTH ONCE DAILY AT 6PM 90 tablet 3  . Blood Glucose Monitoring Suppl (FREESTYLE LITE) DEVI     . DULoxetine (CYMBALTA) 60 MG capsule TAKE 1 CAPSULE BY MOUTH DAILY 90 capsule 1  . glucose blood test strip FOR CHECKING BLOOD SUGAR ONCE DAILY    . ibuprofen (ADVIL) 800 MG tablet TAKE 1 TABLET BY MOUTH 3 TIMES DAILY AS NEEDED 60 tablet 2  . Lancets Misc. (ACCU-CHEK FASTCLIX LANCET) KIT Use to check blood sugar once daily. Dx code: E11.69 1 kit 1  . levothyroxine (SYNTHROID) 137 MCG tablet TAKE 1 TABLET BY MOUTH ONCE DAILY BEFORE BREAKFAST 90 tablet 1  . losartan (COZAAR) 100 MG tablet TAKE 1 TABLET BY MOUTH DAILY. 90 tablet 1  . meloxicam (MOBIC) 15 MG tablet TAKE 1 TABLET BY MOUTH ONCE DAILY 90 tablet 1  . metFORMIN (GLUCOPHAGE) 1000 MG tablet TAKE 1 TABLET BY MOUTH TWICE DAILY WITH MEALS 180 tablet 1  . pantoprazole (PROTONIX) 40 MG tablet TAKE 1 TABLET BY MOUTH ONCE A DAY 90 tablet 3  . potassium chloride (KLOR-CON) 20 MEQ tablet Take 1 tablet (20 mEq total) by mouth 2 (two) times daily. 180 tablet 3  . chlorthalidone (HYGROTON) 50 MG tablet TAKE 2 TABLETS BY MOUTH DAILY. 180 tablet 3   No facility-administered medications prior to visit.    Allergies  Allergen Reactions  .  Niacin And Related Nausea And Vomiting    Passed out and threw up  . Gabapentin Other (See Comments)    Vision changes  ROS Review of Systems    Objective:    Physical Exam Constitutional:      Appearance: She is well-developed.  HENT:     Head: Normocephalic and atraumatic.  Cardiovascular:     Rate and Rhythm: Normal rate and regular rhythm.     Heart sounds: Normal heart sounds.  Pulmonary:     Effort: Pulmonary effort is normal.     Breath sounds: Normal breath sounds.  Skin:    General: Skin is warm and dry.  Neurological:     Mental Status: She is alert and oriented to person, place, and time.  Psychiatric:        Behavior: Behavior normal.     BP (!) 114/48   Pulse 78   Ht '5\' 6"'  (1.676 m)   Wt 215 lb (97.5 kg)   SpO2 97%   BMI 34.70 kg/m  Wt Readings from Last 3 Encounters:  09/05/20 215 lb (97.5 kg)  06/07/20 (!) 218 lb (98.9 kg)  05/20/20 216 lb (98 kg)     Health Maintenance Due  Topic Date Due  . HIV Screening  Never done    There are no preventive care reminders to display for this patient.  Lab Results  Component Value Date   TSH 1.864 01/19/2020   Lab Results  Component Value Date   WBC 7.6 01/19/2020   HGB 12.5 01/19/2020   HCT 37.7 01/19/2020   MCV 89.8 01/19/2020   PLT 210 01/19/2020   Lab Results  Component Value Date   NA 139 05/22/2020   K 3.2 (L) 05/22/2020   CO2 27 05/22/2020   GLUCOSE 154 (H) 05/22/2020   BUN 26 (H) 05/22/2020   CREATININE 1.24 (H) 05/22/2020   BILITOT 0.8 01/19/2020   ALKPHOS 73 01/19/2020   AST 19 01/19/2020   ALT 21 01/19/2020   PROT 7.2 01/19/2020   ALBUMIN 4.2 01/19/2020   CALCIUM 9.0 05/22/2020   ANIONGAP 14 05/22/2020   Lab Results  Component Value Date   CHOL 179 01/19/2020   Lab Results  Component Value Date   HDL 48 01/19/2020   Lab Results  Component Value Date   LDLCALC 106 (H) 01/19/2020   Lab Results  Component Value Date   TRIG 123 01/19/2020   Lab Results   Component Value Date   CHOLHDL 3.7 01/19/2020   Lab Results  Component Value Date   HGBA1C 7.1 (A) 09/05/2020      Assessment & Plan:   Problem List Items Addressed This Visit      Cardiovascular and Mediastinum   HYPERTENSION, BENIGN ESSENTIAL    Well controlled. Continue current regimen. Follow up in  3-4 months.        Relevant Medications   chlorthalidone (HYGROTON) 50 MG tablet     Endocrine   Diabetes mellitus type 2 in obese (HCC) - Primary    A1c is elevated at 7.1.  She admits that she has had some discussions with her diet but plans to get back on track.  We also discussed adding a medication such as Victoza, Trulicity or Ozempic.  Will think about it and let me know.  Otherwise continue with Metformin.      Relevant Orders   POCT glycosylated hemoglobin (Hb A1C) (Completed)   BASIC METABOLIC PANEL WITH GFR    Other Visit Diagnoses    Hypokalemia          Hypokalemia-we will try reducing the chlorthalidone and plan to recheck potassium level.  Meds ordered this encounter  Medications  . chlorthalidone (HYGROTON) 50 MG tablet    Sig: Take 1 tablet (50 mg total) by mouth daily.    Dispense:  90 tablet    Refill:  1    Follow-up: Return in about 3 months (around 12/06/2020) for Diabetes follow-up.    Beatrice Lecher, MD

## 2020-09-05 NOTE — Assessment & Plan Note (Signed)
A1c is elevated at 7.1.  She admits that she has had some discussions with her diet but plans to get back on track.  We also discussed adding a medication such as Victoza, Trulicity or Ozempic.  Will think about it and let me know.  Otherwise continue with Metformin.

## 2020-09-05 NOTE — Assessment & Plan Note (Signed)
Well controlled. Continue current regimen. Follow up in  3-4 months.  

## 2020-09-05 NOTE — Patient Instructions (Addendum)
Let me know if you are interested in trying Victoza, Trulicity, or Ozempic.  These are the injectable diabetes medications that can also promote weight loss.  Okay to decrease chlorthalidone to 1 tab daily for 2 weeks and then recheck your potassium.

## 2020-09-06 ENCOUNTER — Encounter: Payer: Self-pay | Admitting: Family Medicine

## 2020-09-12 ENCOUNTER — Other Ambulatory Visit: Payer: Self-pay | Admitting: Family Medicine

## 2020-09-12 MED FILL — LEVOTHYROXINE 137 MCG TABLE: 137 | 90 days supply | Qty: 90 | Fill #0

## 2020-09-12 MED FILL — PANTOPRAZOLE SOD DR 40 MG T: 40 | 90 days supply | Qty: 90 | Fill #1

## 2020-09-18 ENCOUNTER — Encounter: Payer: Self-pay | Admitting: Family Medicine

## 2020-09-19 NOTE — Telephone Encounter (Signed)
Please explain to her that the [facility] is the weight loss version of Ozempic and her insurance is more likely to cover the Ozempic because of her diabetes.  It is the same drug.  Is she ok with me sending that rx?

## 2020-09-21 ENCOUNTER — Encounter: Payer: Self-pay | Admitting: *Deleted

## 2020-09-23 ENCOUNTER — Other Ambulatory Visit: Payer: Self-pay | Admitting: Family Medicine

## 2020-09-23 MED ORDER — OZEMPIC (0.25 OR 0.5 MG/DOSE) 2 MG/1.5ML ~~LOC~~ SOPN: 0.2500 mg | PEN_INJECTOR | SUBCUTANEOUS | 1 refills | Status: DC

## 2020-09-23 MED FILL — OZEMPIC 0.25 OR 0.5 MG/DOSE: 2 | 84 days supply | Qty: 3 | Fill #0

## 2020-09-23 NOTE — Telephone Encounter (Signed)
rx sent

## 2020-09-29 ENCOUNTER — Encounter: Payer: Self-pay | Admitting: Family Medicine

## 2020-09-30 ENCOUNTER — Other Ambulatory Visit (HOSPITAL_COMMUNITY)
Admit: 2020-09-30 | Discharge: 2020-09-30 | Disposition: A | Discharge status: Home / Self Care | Payer: 59 | Source: Ambulatory Visit | Attending: Family Medicine | Admitting: Family Medicine

## 2020-09-30 DIAGNOSIS — E1169 Type 2 diabetes mellitus with other specified complication: Secondary | ICD-10-CM | POA: Diagnosis not present

## 2020-09-30 LAB — BASIC METABOLIC PANEL
Anion gap: 13 (ref 5–15)
BUN: 19 mg/dL (ref 8–23)
CO2: 28 mmol/L (ref 22–32)
Calcium: 9.3 mg/dL (ref 8.9–10.3)
Chloride: 95 mmol/L — ABNORMAL LOW (ref 98–111)
Creatinine, Ser: 0.92 mg/dL (ref 0.44–1.00)
GFR, Estimated: >60 mL/min (ref >60)
Glucose, Bld: 135 mg/dL — ABNORMAL HIGH (ref 70–99)
Potassium: 3.5 mmol/L (ref 3.5–5.1)
Sodium: 136 mmol/L (ref 135–145)

## 2020-09-30 NOTE — Telephone Encounter (Signed)
I read back through pt's last visit as well as her other mychart messages and I couldn't really tell if you wanted her to stop the Metformin if she started an injectable.

## 2020-10-15 ENCOUNTER — Other Ambulatory Visit: Payer: Self-pay | Admitting: Family Medicine

## 2020-10-15 MED FILL — ATORVASTATIN 40 MG TABLET: 40 | 90 days supply | Qty: 90 | Fill #0

## 2020-10-15 MED FILL — LOSARTAN POTASSIUM 100 MG T: 100 | 90 days supply | Qty: 90 | Fill #0

## 2020-10-22 ENCOUNTER — Encounter: Payer: Self-pay | Admitting: Family Medicine

## 2020-10-23 NOTE — Telephone Encounter (Signed)
FYI

## 2020-10-28 ENCOUNTER — Encounter: Payer: Self-pay | Admitting: Family Medicine

## 2020-11-05 DIAGNOSIS — G4733 Obstructive sleep apnea (adult) (pediatric): Secondary | ICD-10-CM | POA: Diagnosis not present

## 2020-11-11 MED FILL — IBUPROFEN 800 MG TAB: 800 | 20 days supply | Qty: 60 | Fill #1

## 2020-11-11 MED FILL — METFORMIN HCL 1000 MG TABS: 1000 | 90 days supply | Qty: 180 | Fill #1

## 2020-11-20 MED FILL — MELOXICAM 15 MG TABLET: 15 | 90 days supply | Qty: 90 | Fill #1

## 2020-12-03 MED FILL — DULOXETINE HCL 60 MG CPEP: 60 | 90 days supply | Qty: 90 | Fill #1

## 2020-12-03 MED FILL — PANTOPRAZOLE SOD DR 40 MG T: 40 | 90 days supply | Qty: 90 | Fill #2

## 2020-12-03 MED FILL — POTASSIUM CHLORIDE CRYS ER: 20 | 90 days supply | Qty: 180 | Fill #2

## 2020-12-03 MED FILL — ALLOPURINOL 300 MG TABS: 300 | 90 days supply | Qty: 45 | Fill #1

## 2020-12-10 ENCOUNTER — Ambulatory Visit (INDEPENDENT_AMBULATORY_CARE_PROVIDER_SITE_OTHER): Payer: 59 | Admitting: Family Medicine

## 2020-12-10 ENCOUNTER — Other Ambulatory Visit: Payer: Self-pay | Admitting: Family Medicine

## 2020-12-10 ENCOUNTER — Other Ambulatory Visit: Payer: Self-pay

## 2020-12-10 ENCOUNTER — Encounter: Payer: Self-pay | Admitting: Family Medicine

## 2020-12-10 VITALS — BP 124/61 | HR 68 | Ht 66.0 in | Wt 202.0 lb

## 2020-12-10 DIAGNOSIS — G4733 Obstructive sleep apnea (adult) (pediatric): Secondary | ICD-10-CM

## 2020-12-10 DIAGNOSIS — E669 Obesity, unspecified: Secondary | ICD-10-CM | POA: Diagnosis not present

## 2020-12-10 DIAGNOSIS — G609 Hereditary and idiopathic neuropathy, unspecified: Secondary | ICD-10-CM | POA: Diagnosis not present

## 2020-12-10 DIAGNOSIS — E876 Hypokalemia: Secondary | ICD-10-CM | POA: Diagnosis not present

## 2020-12-10 DIAGNOSIS — Z6832 Body mass index (BMI) 32.0-32.9, adult: Secondary | ICD-10-CM

## 2020-12-10 DIAGNOSIS — I10 Essential (primary) hypertension: Secondary | ICD-10-CM

## 2020-12-10 DIAGNOSIS — E1169 Type 2 diabetes mellitus with other specified complication: Secondary | ICD-10-CM

## 2020-12-10 LAB — POCT GLYCOSYLATED HEMOGLOBIN (HGB A1C): Hemoglobin A1C: 5.7 % — AB (ref 4.0–5.6)

## 2020-12-10 MED ORDER — AMBULATORY NON FORMULARY MEDICATION: 0 refills | Status: DC

## 2020-12-10 MED ORDER — DULOXETINE HCL 60 MG PO CPEP: 60.0000 mg | ORAL_CAPSULE | Freq: Every day | ORAL | 1 refills | Status: DC

## 2020-12-10 NOTE — Progress Notes (Signed)
Established Patient Office Visit  Subjective:  Patient ID: Alexis Mcneil, female    DOB: 1956-07-29  Age: 65 y.o. MRN: [id number]  CC:  Chief Complaint  Patient presents with  . Diabetes    HPI Alexis Mcneil presents for   Hypertension- Pt denies chest pain, SOB, dizziness, or heart palpitations.  Taking meds as directed w/o problems.  Denies medication side effects.    Diabetes-she is been doing really well on the Ozempic.  She says she really only gets nauseated if she eats certain things such as fried foods but otherwise she tolerates it well.  Is her goal is to be able to get healthier and get off of some of her medication before she retires.  Past Medical History:  Diagnosis Date  . Anemia   . Arthritis    KNEE,SHOULDERS  . Diabetes mellitus without complication ([place])   . GERD (gastroesophageal reflux disease)   . Heart murmur   . Hyperlipidemia   . Hypertension   . IFG (impaired fasting glucose)   . Neuromuscular disorder (HCC)    NEUROPATHY FEET  . OSA (obstructive sleep apnea)    CPAP  . Sleep apnea    CPAP  . Thyroid disease     Past Surgical History:  Procedure Laterality Date  . COLONOSCOPY    . endometrial polyp     January 2014  . mouth palate surgery repair  1961   large tear  . ROOT CANEL     2 WEEKS AGO  . rt knee surgery arthroscopy  2009   torn meniscus/medial  . torn ligament shoulder  1975   repaired    Family History  Problem Relation Age of Onset  . Colon cancer Father 64       colon/ polyps  . Heart attack Father   . Stroke Father   . Hyperlipidemia Mother        Living  . Hypertension Mother   . Colon cancer Paternal Grandmother   . Stroke Paternal Grandmother   . Heart attack Paternal Grandmother        heart attack  . Diabetes Other        grandmother  . Supraventricular tachycardia Sister   . Esophageal cancer Neg Hx   . Stomach cancer Neg Hx   . Rectal cancer Neg Hx   . Breast cancer Neg Hx     Social History    Socioeconomic History  . Marital status: Single    Spouse name: Not on file  . Number of children: Not on file  . Years of education: Not on file  . Highest education level: Not on file  Occupational History  . Not on file  Tobacco Use  . Smoking status: Never Smoker  . Smokeless tobacco: Never Used  Vaping Use  . Vaping Use: Never used  Substance and Sexual Activity  . Alcohol use: Yes    Alcohol/week: 0.0 standard drinks    Comment: occasional beer on the weekends.    . Drug use: No  . Sexual activity: Yes    Birth control/protection: Post-menopausal  Other Topics Concern  . Not on file  Social History Narrative   Lives alone in a 2 story home.  Has no children.     Works for [employer].     Education: associates degree.   Social Determinants of Health   Financial Resource Strain: Not on file  Food Insecurity: Not on file  Transportation Needs: Not on  file  Physical Activity: Not on file  Stress: Not on file  Social Connections: Not on file  Intimate Partner Violence: Not on file    Outpatient Medications Prior to Visit  Medication Sig Dispense Refill  . allopurinol (ZYLOPRIM) 300 MG tablet TAKE 1/2 TABLET BY MOUTH DAILY 45 tablet 3  . AMBULATORY NON FORMULARY MEDICATION Medication Name: CPAP machine and supplies with humidifier.  Please set her CPAP to 9 cm water pressure. Then download after one week so can review her apneas on 9 cm water pressure.  [facility]. Dx. OSA G47.33 1 vial 0  . AMBULATORY NON FORMULARY MEDICATION     . atorvastatin (LIPITOR) 40 MG tablet TAKE 1 TABLET BY MOUTH ONCE DAILY AT 6PM 90 tablet 3  . Blood Glucose Monitoring Suppl (FREESTYLE LITE) DEVI     . chlorthalidone (HYGROTON) 50 MG tablet Take 1 tablet (50 mg total) by mouth daily. 90 tablet 1  . glucose blood test strip FOR CHECKING BLOOD SUGAR ONCE DAILY    . ibuprofen (ADVIL) 800 MG tablet TAKE 1 TABLET BY MOUTH 3 TIMES DAILY AS NEEDED 60 tablet 2  . Lancets Misc.  (ACCU-CHEK FASTCLIX LANCET) KIT Use to check blood sugar once daily. Dx code: E11.69 1 kit 1  . levothyroxine (SYNTHROID) 137 MCG tablet TAKE 1 TABLET BY MOUTH ONCE DAILY BEFORE BREAKFAST 90 tablet 1  . losartan (COZAAR) 100 MG tablet TAKE 1 TABLET BY MOUTH ONCE DAILY 90 tablet 1  . meloxicam (MOBIC) 15 MG tablet TAKE 1 TABLET BY MOUTH ONCE DAILY 90 tablet 1  . metFORMIN (GLUCOPHAGE) 1000 MG tablet TAKE 1 TABLET BY MOUTH TWICE DAILY WITH MEALS 180 tablet 1  . pantoprazole (PROTONIX) 40 MG tablet TAKE 1 TABLET BY MOUTH ONCE A DAY 90 tablet 3  . potassium chloride (KLOR-CON) 20 MEQ tablet Take 1 tablet (20 mEq total) by mouth 2 (two) times daily. 180 tablet 3  . Semaglutide,0.25 or 0.5MG/DOS, (OZEMPIC, 0.25 OR 0.5 MG/DOSE,) 2 MG/1.5ML SOPN Inject 0.25 mg into the skin once a week. 3 mL 1  . DULoxetine (CYMBALTA) 60 MG capsule TAKE 1 CAPSULE BY MOUTH DAILY 90 capsule 1   No facility-administered medications prior to visit.    Allergies  Allergen Reactions  . Niacin And Related Nausea And Vomiting    Passed out and threw up  . Gabapentin Other (See Comments)    Vision changes    ROS Review of Systems    Objective:    Physical Exam  BP 124/61   Pulse 68   Ht '5\' 6"'  (1.676 m)   Wt 202 lb (91.6 kg)   SpO2 99%   BMI 32.60 kg/m  Wt Readings from Last 3 Encounters:  12/10/20 202 lb (91.6 kg)  09/05/20 215 lb (97.5 kg)  06/07/20 (!) 218 lb (98.9 kg)     Health Maintenance Due  Topic Date Due  . HIV Screening  Never done    There are no preventive care reminders to display for this patient.  Lab Results  Component Value Date   TSH 1.864 01/19/2020   Lab Results  Component Value Date   WBC 7.6 01/19/2020   HGB 12.5 01/19/2020   HCT 37.7 01/19/2020   MCV 89.8 01/19/2020   PLT 210 01/19/2020   Lab Results  Component Value Date   NA 136 09/30/2020   K 3.5 09/30/2020   CO2 28 09/30/2020   GLUCOSE 135 (H) 09/30/2020   BUN 19 09/30/2020   CREATININE 0.92  09/30/2020    BILITOT 0.8 01/19/2020   ALKPHOS 73 01/19/2020   AST 19 01/19/2020   ALT 21 01/19/2020   PROT 7.2 01/19/2020   ALBUMIN 4.2 01/19/2020   CALCIUM 9.3 09/30/2020   ANIONGAP 13 09/30/2020   Lab Results  Component Value Date   CHOL 179 01/19/2020   Lab Results  Component Value Date   HDL 48 01/19/2020   Lab Results  Component Value Date   LDLCALC 106 (H) 01/19/2020   Lab Results  Component Value Date   TRIG 123 01/19/2020   Lab Results  Component Value Date   CHOLHDL 3.7 01/19/2020   Lab Results  Component Value Date   HGBA1C 5.7 (A) 12/10/2020      Assessment & Plan:   Problem List Items Addressed This Visit      Cardiovascular and Mediastinum   HYPERTENSION, BENIGN ESSENTIAL    Blood pressure looks great.  She has done a fantastic job with weight loss she is actually down about 13 pounds since she was last here.  We discussed possibly decreasing her chlorthalidone so that she does not have to use as much potassium forgot have her go down to 25 mg for 2 weeks and see if her pressure still look good if they do then we will plan to recheck her potassium and if that looks good we might be able to reduce her dose.  Says the pills are quite large and has difficulty swallowing them at times.        Respiratory   Obstructive sleep apnea    Mod/SEvere OSA - please set CPAP to AutoPap settings of 4 to 15 cm of water pressure.  Please fax me a download after 1 week so that we can reset her pressure.  Patient has lost a fair amount of weight and I want to see if we might be able to reduce her current pressure.  Please fax to [facility].      Relevant Medications   AMBULATORY NON FORMULARY MEDICATION     Endocrine   Diabetes mellitus type 2 in obese [facility]) - Primary    Is actually doing really well on Ozempic.  She still on the 0.25 mg dose and is already lost 13 pounds she feels like it is really helped her portion control in fact her blood sugars have been in the 80s  to 100s and she has been really happy with that.  She has not had any hypoglycemic episodes.  She wonders if she might be able to start coming off her Metformin medical head and have her decrease her dose down to 1 tab daily on the Metformin and try to go up to the 0.5 on the Ozempic.  If she tolerates that well then I would have her completely stop her Metformin and keep her follow-up in 3 months to recheck her A1c which looks phenomenal today at 5.7.      Relevant Orders   POCT glycosylated hemoglobin (Hb A1C) (Completed)     Nervous and Auditory   Hereditary and idiopathic peripheral neuropathy   Relevant Medications   DULoxetine (CYMBALTA) 60 MG capsule    Other Visit Diagnoses    Hypokalemia       BMI 32.0-32.9,adult          Meds ordered this encounter  Medications  . AMBULATORY NON FORMULARY MEDICATION    Sig: Medication Name: please set CPAP to AutoPap settings of 4 to 15 cm of water pressure.  Please fax me a download after 1 week so that we can reset her pressure.  Patient has lost a fair amount of weight and I want to see if we might be able to reduce her current pressure.  Please fax to [facility].    Dispense:  1 Units    Refill:  0  . DULoxetine (CYMBALTA) 60 MG capsule    Sig: Take 1 capsule (60 mg total) by mouth daily.    Dispense:  90 capsule    Refill:  1    Follow-up: Return in about 3 months (around 03/09/2021) for Diabetes follow-up.     Beatrice Lecher, MD

## 2020-12-10 NOTE — Assessment & Plan Note (Signed)
Mod/SEvere OSA - please set CPAP to AutoPap settings of 4 to 15 cm of water pressure.  Please fax me a download after 1 week so that we can reset her pressure.  Patient has lost a fair amount of weight and I want to see if we might be able to reduce her current pressure.  Please fax to [facility].

## 2020-12-10 NOTE — Assessment & Plan Note (Signed)
Is actually doing really well on Ozempic.  She still on the 0.25 mg dose and is already lost 13 pounds she feels like it is really helped her portion control in fact her blood sugars have been in the 80s to 100s and she has been really happy with that.  She has not had any hypoglycemic episodes.  She wonders if she might be able to start coming off her Metformin medical head and have her decrease her dose down to 1 tab daily on the Metformin and try to go up to the 0.5 on the Ozempic.  If she tolerates that well then I would have her completely stop her Metformin and keep her follow-up in 3 months to recheck her A1c which looks phenomenal today at 5.7.

## 2020-12-10 NOTE — Patient Instructions (Signed)
Ok to decrease metformin to 1 tab daily. If you do well on the 0.5 mg shot then you can stop the metformin completely until I see you back.    Split the chlorthalidone in half for 2 weeks.  If your blood pressure looks good at the end of the 2 weeks then let me know and I can send over the 25 mg chlorthalidone and then we can send you a lab slip to check your potassium to see if we might be able to back off of your potassium.  We will contact your CPAP supplier, [facility] to see if we can get them to change her setting and see if it might need to be adjusted.

## 2020-12-10 NOTE — Assessment & Plan Note (Signed)
Blood pressure looks great.  She has done a fantastic job with weight loss she is actually down about 13 pounds since she was last here.  We discussed possibly decreasing her chlorthalidone so that she does not have to use as much potassium forgot have her go down to 25 mg for 2 weeks and see if her pressure still look good if they do then we will plan to recheck her potassium and if that looks good we might be able to reduce her dose.  Says the pills are quite large and has difficulty swallowing them at times.

## 2020-12-23 MED FILL — LEVOTHYROXINE 137 MCG TABLE: 137 | 90 days supply | Qty: 90 | Fill #1

## 2020-12-23 MED FILL — OZEMPIC 0.25 OR 0.5 MG/DOSE: 2 | 56 days supply | Qty: 2 | Fill #1

## 2020-12-25 DIAGNOSIS — E119 Type 2 diabetes mellitus without complications: Secondary | ICD-10-CM | POA: Diagnosis not present

## 2020-12-25 DIAGNOSIS — H04123 Dry eye syndrome of bilateral lacrimal glands: Secondary | ICD-10-CM | POA: Diagnosis not present

## 2020-12-25 DIAGNOSIS — Z135 Encounter for screening for eye and ear disorders: Secondary | ICD-10-CM | POA: Diagnosis not present

## 2020-12-25 DIAGNOSIS — H25813 Combined forms of age-related cataract, bilateral: Secondary | ICD-10-CM | POA: Diagnosis not present

## 2020-12-25 DIAGNOSIS — H5213 Myopia, bilateral: Secondary | ICD-10-CM | POA: Diagnosis not present

## 2020-12-25 LAB — HM DIABETES EYE EXAM

## 2021-01-10 ENCOUNTER — Other Ambulatory Visit (HOSPITAL_COMMUNITY): Payer: Self-pay | Admitting: Pharmacist

## 2021-01-10 MED FILL — CARESTART COVID-19 HOME TES: 4 days supply | Qty: 4 | Fill #0

## 2021-01-14 MED FILL — LOSARTAN POTASSIUM 100 MG T: 100 | 30 days supply | Qty: 30 | Fill #1

## 2021-01-14 MED FILL — ATORVASTATIN 40 MG TABLET: 40 | 90 days supply | Qty: 90 | Fill #1

## 2021-01-24 ENCOUNTER — Encounter: Payer: Self-pay | Admitting: Family Medicine

## 2021-01-28 ENCOUNTER — Other Ambulatory Visit: Payer: Self-pay | Admitting: Family Medicine

## 2021-01-28 DIAGNOSIS — I10 Essential (primary) hypertension: Secondary | ICD-10-CM

## 2021-01-28 MED FILL — CHLORTHALIDONE 50 MG TABS: 50 | 90 days supply | Qty: 180 | Fill #0

## 2021-02-11 MED FILL — Semaglutide Soln Pen-inj 0.25 or 0.5 MG/DOSE (2 MG/1.5ML): SUBCUTANEOUS | 28 days supply | Qty: 1.5 | Fill #0 | Status: AC

## 2021-02-12 ENCOUNTER — Other Ambulatory Visit (HOSPITAL_COMMUNITY): Payer: Self-pay

## 2021-02-18 ENCOUNTER — Other Ambulatory Visit (HOSPITAL_COMMUNITY): Payer: Self-pay

## 2021-02-18 ENCOUNTER — Other Ambulatory Visit: Payer: Self-pay | Admitting: Family Medicine

## 2021-02-18 MED ORDER — MELOXICAM 15 MG PO TABS
ORAL_TABLET | Freq: Every day | ORAL | 1 refills | Status: DC
  Filled 2021-02-18: qty 90, 90d supply, fill #0
  Filled 2021-05-19: qty 90, 90d supply, fill #1

## 2021-02-18 MED FILL — Losartan Potassium Tab 100 MG: ORAL | 60 days supply | Qty: 60 | Fill #0 | Status: AC

## 2021-02-18 MED FILL — Allopurinol Tab 300 MG: ORAL | 90 days supply | Qty: 45 | Fill #0 | Status: AC

## 2021-02-19 ENCOUNTER — Other Ambulatory Visit (HOSPITAL_COMMUNITY): Payer: Self-pay

## 2021-02-20 ENCOUNTER — Other Ambulatory Visit: Payer: Self-pay | Admitting: *Deleted

## 2021-02-20 ENCOUNTER — Encounter: Payer: Self-pay | Admitting: Family Medicine

## 2021-02-20 ENCOUNTER — Other Ambulatory Visit (HOSPITAL_COMMUNITY): Payer: Self-pay

## 2021-02-20 MED ORDER — GLUCOSE BLOOD VI STRP
ORAL_STRIP | 1 refills | Status: DC
  Filled 2021-02-20: qty 100, 90d supply, fill #0
  Filled 2021-08-05: qty 100, 90d supply, fill #1

## 2021-02-21 ENCOUNTER — Other Ambulatory Visit (HOSPITAL_COMMUNITY): Payer: Self-pay

## 2021-03-04 MED FILL — Potassium Chloride Microencapsulated Crys ER Tab 20 mEq: ORAL | 90 days supply | Qty: 180 | Fill #0 | Status: AC

## 2021-03-05 ENCOUNTER — Other Ambulatory Visit (HOSPITAL_COMMUNITY): Payer: Self-pay

## 2021-03-05 MED FILL — Duloxetine HCl Enteric Coated Pellets Cap 60 MG (Base Eq): ORAL | 90 days supply | Qty: 90 | Fill #0 | Status: AC

## 2021-03-11 ENCOUNTER — Other Ambulatory Visit (HOSPITAL_COMMUNITY): Payer: Self-pay

## 2021-03-11 ENCOUNTER — Encounter: Payer: Self-pay | Admitting: Family Medicine

## 2021-03-11 ENCOUNTER — Ambulatory Visit (INDEPENDENT_AMBULATORY_CARE_PROVIDER_SITE_OTHER): Payer: 59 | Admitting: Family Medicine

## 2021-03-11 ENCOUNTER — Other Ambulatory Visit: Payer: Self-pay

## 2021-03-11 VITALS — BP 121/59 | HR 78 | Ht 66.0 in | Wt 205.0 lb

## 2021-03-11 DIAGNOSIS — G609 Hereditary and idiopathic neuropathy, unspecified: Secondary | ICD-10-CM

## 2021-03-11 DIAGNOSIS — K5903 Drug induced constipation: Secondary | ICD-10-CM

## 2021-03-11 DIAGNOSIS — G4733 Obstructive sleep apnea (adult) (pediatric): Secondary | ICD-10-CM

## 2021-03-11 DIAGNOSIS — E1169 Type 2 diabetes mellitus with other specified complication: Secondary | ICD-10-CM | POA: Diagnosis not present

## 2021-03-11 DIAGNOSIS — I10 Essential (primary) hypertension: Secondary | ICD-10-CM | POA: Diagnosis not present

## 2021-03-11 DIAGNOSIS — E669 Obesity, unspecified: Secondary | ICD-10-CM

## 2021-03-11 DIAGNOSIS — E876 Hypokalemia: Secondary | ICD-10-CM | POA: Diagnosis not present

## 2021-03-11 DIAGNOSIS — E785 Hyperlipidemia, unspecified: Secondary | ICD-10-CM

## 2021-03-11 LAB — POCT GLYCOSYLATED HEMOGLOBIN (HGB A1C): Hemoglobin A1C: 5.7 % — AB (ref 4.0–5.6)

## 2021-03-11 MED ORDER — METFORMIN HCL 1000 MG PO TABS
1000.0000 mg | ORAL_TABLET | Freq: Every day | ORAL | 1 refills | Status: DC
  Filled 2021-03-11: qty 90, 90d supply, fill #0

## 2021-03-11 MED ORDER — SEMAGLUTIDE(0.25 OR 0.5MG/DOS) 2 MG/1.5ML ~~LOC~~ SOPN
0.5000 mg | PEN_INJECTOR | SUBCUTANEOUS | 3 refills | Status: DC
  Filled 2021-03-11: qty 4.5, 84d supply, fill #0
  Filled 2021-05-27: qty 4.5, 84d supply, fill #1
  Filled 2021-08-25: qty 1.5, 28d supply, fill #2
  Filled 2021-09-21: qty 1.5, 28d supply, fill #3

## 2021-03-11 NOTE — Assessment & Plan Note (Signed)
A1c is 5.7 today which looks phenomenal.  We will continue with Ozempic which has been helping curb her appetite which is great.  We discussed we can always work on going up in the future but for now we need to address the slower bowels.  Recommend doing MiraLAX to do a cleanout and then may be starting with a stool softener while taking the medication.  If she is moving her bowels more regularly then the gas should improve as well.

## 2021-03-11 NOTE — Progress Notes (Addendum)
Established Patient Office Visit  Subjective:  Patient ID: Alexis Mcneil, female    DOB: 1956/05/01  Age: 65 y.o. MRN: [id number]  CC:  Chief Complaint  Patient presents with  . Diabetes  . Hypertension    HPI [name] presents for   Follow-up diabetes-overall she is doing really well no recent problems.  She says that she had a family member in the hospital recently so was not eating great around that time.  She has been tolerating the Ozempic well overall she is now up to 0.5 mg.  Since going up on the dose she is noticed that she has had more gas and it has slowed down her bowels.  She is to go very regularly until she went up on the dose.  She had stopped the metformin but blood sugar started going back up into the low 100s and so she restarted it but she is only taking it once a day instead of twice a day.  Obstructive sleep apnea-she is now on AutoPap and her pressure can even go up to 13 at time she was previously set on 9.  But she has been swallowing a lot of air at night and then waking up feeling like she has to belch.  Still struggling with her peripheral neuropathy.  She says it is worse at the end of the day when she takes her shoes off.  She has tried gabapentin in the past and it caused vision changes.  She is currently on duloxetine which does help some.  Past Medical History:  Diagnosis Date  . Anemia   . Arthritis    KNEE,SHOULDERS  . Diabetes mellitus without complication ([place])   . GERD (gastroesophageal reflux disease)   . Heart murmur   . Hyperlipidemia   . Hypertension   . IFG (impaired fasting glucose)   . Neuromuscular disorder (HCC)    NEUROPATHY FEET  . OSA (obstructive sleep apnea)    CPAP  . Sleep apnea    CPAP  . Thyroid disease     Past Surgical History:  Procedure Laterality Date  . COLONOSCOPY    . endometrial polyp     January 2014  . mouth palate surgery repair  1961   large tear  . ROOT CANEL     2 WEEKS AGO  . rt knee  surgery arthroscopy  2009   torn meniscus/medial  . torn ligament shoulder  1975   repaired    Family History  Problem Relation Age of Onset  . Colon cancer Father 90       colon/ polyps  . Heart attack Father   . Stroke Father   . Hyperlipidemia Mother        Living  . Hypertension Mother   . Colon cancer Paternal Grandmother   . Stroke Paternal Grandmother   . Heart attack Paternal Grandmother        heart attack  . Diabetes Other        grandmother  . Supraventricular tachycardia Sister   . Esophageal cancer Neg Hx   . Stomach cancer Neg Hx   . Rectal cancer Neg Hx   . Breast cancer Neg Hx     Social History   Socioeconomic History  . Marital status: Single    Spouse name: Not on file  . Number of children: Not on file  . Years of education: Not on file  . Highest education level: Not on file  Occupational History  .  Not on file  Tobacco Use  . Smoking status: Never Smoker  . Smokeless tobacco: Never Used  Vaping Use  . Vaping Use: Never used  Substance and Sexual Activity  . Alcohol use: Yes    Alcohol/week: 0.0 standard drinks    Comment: occasional beer on the weekends.    . Drug use: No  . Sexual activity: Yes    Birth control/protection: Post-menopausal  Other Topics Concern  . Not on file  Social History Narrative   Lives alone in a 2 story home.  Has no children.     Works for [employer].     Education: associates degree.   Social Determinants of Health   Financial Resource Strain: Not on file  Food Insecurity: Not on file  Transportation Needs: Not on file  Physical Activity: Not on file  Stress: Not on file  Social Connections: Not on file  Intimate Partner Violence: Not on file    Outpatient Medications Prior to Visit  Medication Sig Dispense Refill  . allopurinol (ZYLOPRIM) 300 MG tablet TAKE 1/2 TABLET BY MOUTH DAILY 45 tablet 3  . AMBULATORY NON FORMULARY MEDICATION Medication Name: CPAP machine and supplies with  humidifier.  Please set her CPAP to 9 cm water pressure. Then download after one week so can review her apneas on 9 cm water pressure.  [facility]. Dx. OSA G47.33 1 vial 0  . atorvastatin (LIPITOR) 40 MG tablet TAKE 1 TABLET BY MOUTH EVERY EVENING AT 6PM 90 tablet 3  . Blood Glucose Monitoring Suppl (FREESTYLE LITE) DEVI     . chlorthalidone (HYGROTON) 50 MG tablet TAKE 2 TABLETS BY MOUTH DAILY. 180 tablet 3  . DULoxetine (CYMBALTA) 60 MG capsule TAKE 1 CAPSULE BY MOUTH DAILY 90 capsule 1  . glucose blood test strip Freestyle Lite test strips.  FOR CHECKING BLOOD SUGAR ONCE DAILY 100 each 1  . Lancets Misc. (ACCU-CHEK FASTCLIX LANCET) KIT Use to check blood sugar once daily. Dx code: E11.69 1 kit 1  . levothyroxine (SYNTHROID) 137 MCG tablet TAKE 1 TABLET BY MOUTH ONCE DAILY BEFORE BREAKFAST 90 tablet 1  . losartan (COZAAR) 100 MG tablet TAKE 1 TABLET BY MOUTH ONCE DAILY 90 tablet 1  . meloxicam (MOBIC) 15 MG tablet TAKE 1 TABLET BY MOUTH ONCE DAILY 90 tablet 1  . pantoprazole (PROTONIX) 40 MG tablet TAKE 1 TABLET BY MOUTH ONCE A DAY 90 tablet 3  . potassium chloride SA (KLOR-CON) 20 MEQ tablet TAKE 1 TABLET BY MOUTH TWICE DAILY 180 tablet 3  . metFORMIN (GLUCOPHAGE) 1000 MG tablet TAKE 1 TABLET BY MOUTH TWICE DAILY WITH MEALS 180 tablet 1  . Semaglutide,0.25 or 0.5MG/DOS, 2 MG/1.5ML SOPN INJECT 0.25 MG INTO THE SKIN ONCE A WEEK. 3 mL 1  . AMBULATORY NON FORMULARY MEDICATION     . AMBULATORY NON FORMULARY MEDICATION Medication Name: please set CPAP to AutoPap settings of 4 to 15 cm of water pressure.  Please fax me a download after 1 week so that we can reset her pressure.  Patient has lost a fair amount of weight and I want to see if we might be able to reduce her current pressure.  Please fax to [facility]. 1 Units 0  . COVID-19 At Home Antigen Test KIT USE AS DIRECTED WITHIN PACKAGE INSTRUCTIONS. 4 kit 0   No facility-administered medications prior to visit.    Allergies   Allergen Reactions  . Niacin And Related Nausea And Vomiting    Passed  out and threw up  . Gabapentin Other (See Comments)    Vision changes    ROS Review of Systems    Objective:    Physical Exam Constitutional:      Appearance: She is well-developed.  HENT:     Head: Normocephalic and atraumatic.  Cardiovascular:     Rate and Rhythm: Normal rate and regular rhythm.     Heart sounds: Normal heart sounds.  Pulmonary:     Effort: Pulmonary effort is normal.     Breath sounds: Normal breath sounds.  Skin:    General: Skin is warm and dry.  Neurological:     Mental Status: She is alert and oriented to person, place, and time.  Psychiatric:        Behavior: Behavior normal.     BP (!) 121/59   Pulse 78   Ht '5\' 6"'  (1.676 m)   Wt 205 lb (93 kg)   SpO2 98%   BMI 33.09 kg/m  Wt Readings from Last 3 Encounters:  03/11/21 205 lb (93 kg)  12/10/20 202 lb (91.6 kg)  09/05/20 215 lb (97.5 kg)     Health Maintenance Due  Topic Date Due  . HIV Screening  Never done  . PAP SMEAR-Modifier  02/11/2021    There are no preventive care reminders to display for this patient.  Lab Results  Component Value Date   TSH 1.864 01/19/2020   Lab Results  Component Value Date   WBC 7.6 01/19/2020   HGB 12.5 01/19/2020   HCT 37.7 01/19/2020   MCV 89.8 01/19/2020   PLT 210 01/19/2020   Lab Results  Component Value Date   NA 136 09/30/2020   K 3.5 09/30/2020   CO2 28 09/30/2020   GLUCOSE 135 (H) 09/30/2020   BUN 19 09/30/2020   CREATININE 0.92 09/30/2020   BILITOT 0.8 01/19/2020   ALKPHOS 73 01/19/2020   AST 19 01/19/2020   ALT 21 01/19/2020   PROT 7.2 01/19/2020   ALBUMIN 4.2 01/19/2020   CALCIUM 9.3 09/30/2020   ANIONGAP 13 09/30/2020   Lab Results  Component Value Date   CHOL 179 01/19/2020   Lab Results  Component Value Date   HDL 48 01/19/2020   Lab Results  Component Value Date   LDLCALC 106 (H) 01/19/2020   Lab Results  Component Value Date    TRIG 123 01/19/2020   Lab Results  Component Value Date   CHOLHDL 3.7 01/19/2020   Lab Results  Component Value Date   HGBA1C 5.7 (A) 03/11/2021      Assessment & Plan:   Problem List Items Addressed This Visit      Cardiovascular and Mediastinum   HYPERTENSION, BENIGN ESSENTIAL    Well controlled. Continue current regimen. Follow up in 3 mo       Relevant Orders   Uric acid   CBC   Lipid panel   Comprehensive Metabolic Panel (CMET)     Respiratory   Obstructive sleep apnea    Discussed that we can always get a download off her CPAP from the AutoPap and use the average pressure to reset her CPAP if she feels like that would be more helpful especially with swallowing air and the belching.  She will think about it and let me know.        Endocrine   Diabetes mellitus type 2 in obese (HCC) - Primary    A1c is 5.7 today which looks phenomenal.  We will continue with  Ozempic which has been helping curb her appetite which is great.  We discussed we can always work on going up in the future but for now we need to address the slower bowels.  Recommend doing MiraLAX to do a cleanout and then may be starting with a stool softener while taking the medication.  If she is moving her bowels more regularly then the gas should improve as well.      Relevant Medications   metFORMIN (GLUCOPHAGE) 1000 MG tablet   Semaglutide,0.25 or 0.5MG/DOS, 2 MG/1.5ML SOPN   Other Relevant Orders   POCT glycosylated hemoglobin (Hb A1C) (Completed)   Uric acid   CBC   Lipid panel   Comprehensive Metabolic Panel (CMET)     Nervous and Auditory   Hereditary and idiopathic peripheral neuropathy    Gabapentin caused side effects.  Currently on Cymbalta and getting some mild relief.  Could consider trial of Lyrica.        Other   Hyperlipidemia   Relevant Orders   Uric acid   CBC   Lipid panel   Comprehensive Metabolic Panel (CMET)    Other Visit Diagnoses    Hypokalemia       Relevant  Orders   Uric acid   CBC   Lipid panel   Comprehensive Metabolic Panel (CMET)   Drug-induced constipation         We will get updated labs.  Meds ordered this encounter  Medications  . metFORMIN (GLUCOPHAGE) 1000 MG tablet    Sig: Take 1 tablet (1,000 mg total) by mouth daily with breakfast.    Dispense:  90 tablet    Refill:  1  . Semaglutide,0.25 or 0.5MG/DOS, 2 MG/1.5ML SOPN    Sig: Inject 0.5 mg into the skin once a week.    Dispense:  4.5 mL    Refill:  3    Follow-up: Return in about 3 months (around 06/11/2021) for DM.    Beatrice Lecher, MD

## 2021-03-11 NOTE — Assessment & Plan Note (Signed)
Discussed that we can always get a download off her CPAP from the AutoPap and use the average pressure to reset her CPAP if she feels like that would be more helpful especially with swallowing air and the belching.  She will think about it and let me know.

## 2021-03-11 NOTE — Assessment & Plan Note (Signed)
Gabapentin caused side effects.  Currently on Cymbalta and getting some mild relief.  Could consider trial of Lyrica.

## 2021-03-11 NOTE — Assessment & Plan Note (Signed)
Well controlled. Continue current regimen. Follow up in  3 mo .  

## 2021-03-12 ENCOUNTER — Other Ambulatory Visit (HOSPITAL_COMMUNITY): Payer: Self-pay

## 2021-03-14 ENCOUNTER — Other Ambulatory Visit (HOSPITAL_COMMUNITY): Payer: Self-pay

## 2021-03-14 MED FILL — Pantoprazole Sodium EC Tab 40 MG (Base Equiv): ORAL | 90 days supply | Qty: 90 | Fill #0 | Status: AC

## 2021-03-18 ENCOUNTER — Other Ambulatory Visit (HOSPITAL_COMMUNITY)
Admission: RE | Admit: 2021-03-18 | Discharge: 2021-03-18 | Disposition: A | Discharge status: Home / Self Care | Payer: 59 | Source: Ambulatory Visit | Attending: Family Medicine | Admitting: Family Medicine

## 2021-03-18 DIAGNOSIS — E119 Type 2 diabetes mellitus without complications: Secondary | ICD-10-CM | POA: Diagnosis not present

## 2021-03-18 DIAGNOSIS — E876 Hypokalemia: Secondary | ICD-10-CM | POA: Diagnosis not present

## 2021-03-18 DIAGNOSIS — I10 Essential (primary) hypertension: Secondary | ICD-10-CM | POA: Diagnosis not present

## 2021-03-18 DIAGNOSIS — E669 Obesity, unspecified: Secondary | ICD-10-CM | POA: Insufficient documentation

## 2021-03-18 DIAGNOSIS — E785 Hyperlipidemia, unspecified: Secondary | ICD-10-CM | POA: Diagnosis not present

## 2021-03-18 LAB — CBC
HCT: 39.1 % (ref 36.0–46.0)
Hemoglobin: 13.2 g/dL (ref 12.0–15.0)
MCH: 30 pg (ref 26.0–34.0)
MCHC: 33.8 g/dL (ref 30.0–36.0)
MCV: 88.9 fL (ref 80.0–100.0)
Platelets: 247 10*3/uL (ref 150–400)
RBC: 4.4 MIL/uL (ref 3.87–5.11)
RDW: 14.6 % (ref 11.5–15.5)
WBC: 6.4 10*3/uL (ref 4.0–10.5)
nRBC: 0 % (ref 0.0–0.2)

## 2021-03-18 LAB — COMPREHENSIVE METABOLIC PANEL
ALT: 22 U/L (ref 0–44)
AST: 18 U/L (ref 15–41)
Albumin: 4.2 g/dL (ref 3.5–5.0)
Alkaline Phosphatase: 80 U/L (ref 38–126)
Anion gap: 8 (ref 5–15)
BUN: 18 mg/dL (ref 8–23)
CO2: 31 mmol/L (ref 22–32)
Calcium: 9.5 mg/dL (ref 8.9–10.3)
Chloride: 99 mmol/L (ref 98–111)
Creatinine, Ser: 1.02 mg/dL — ABNORMAL HIGH (ref 0.44–1.00)
GFR, Estimated: >60 mL/min (ref >60)
Glucose, Bld: 124 mg/dL — ABNORMAL HIGH (ref 70–99)
Potassium: 3.3 mmol/L — ABNORMAL LOW (ref 3.5–5.1)
Sodium: 138 mmol/L (ref 135–145)
Total Bilirubin: 0.8 mg/dL (ref 0.3–1.2)
Total Protein: 7.5 g/dL (ref 6.5–8.1)

## 2021-03-18 LAB — LIPID PANEL
Cholesterol: 191 mg/dL (ref 0–200)
HDL: 52 mg/dL (ref >40)
LDL Cholesterol: 116 mg/dL — ABNORMAL HIGH (ref 0–99)
Total CHOL/HDL Ratio: 3.7 RATIO
Triglycerides: 117 mg/dL (ref <150)
VLDL: 23 mg/dL (ref 0–40)

## 2021-03-18 LAB — URIC ACID: Uric Acid, Serum: 5 mg/dL (ref 2.5–7.1)

## 2021-03-21 ENCOUNTER — Other Ambulatory Visit: Payer: Self-pay | Admitting: Family Medicine

## 2021-03-21 ENCOUNTER — Other Ambulatory Visit (HOSPITAL_COMMUNITY): Payer: Self-pay

## 2021-03-21 MED ORDER — LEVOTHYROXINE SODIUM 137 MCG PO TABS
ORAL_TABLET | Freq: Every day | ORAL | 1 refills | Status: DC
  Filled 2021-03-21: qty 90, 90d supply, fill #0
  Filled 2021-06-23: qty 90, 90d supply, fill #1

## 2021-04-16 ENCOUNTER — Other Ambulatory Visit: Payer: Self-pay | Admitting: Family Medicine

## 2021-04-16 MED FILL — Atorvastatin Calcium Tab 40 MG (Base Equivalent): ORAL | 90 days supply | Qty: 90 | Fill #0 | Status: AC

## 2021-04-17 ENCOUNTER — Other Ambulatory Visit (HOSPITAL_COMMUNITY): Payer: Self-pay

## 2021-04-17 MED ORDER — LOSARTAN POTASSIUM 100 MG PO TABS
1.0000 | ORAL_TABLET | Freq: Every day | ORAL | 1 refills | Status: DC
  Filled 2021-04-17: qty 90, 90d supply, fill #0
  Filled 2021-07-14: qty 90, 90d supply, fill #1

## 2021-05-13 ENCOUNTER — Encounter: Payer: Self-pay | Admitting: Family Medicine

## 2021-05-14 NOTE — Telephone Encounter (Signed)
Ok to change to CPE?

## 2021-05-15 NOTE — Telephone Encounter (Signed)
Appointment has been changed to a physical. AM

## 2021-05-19 ENCOUNTER — Other Ambulatory Visit (HOSPITAL_COMMUNITY): Payer: Self-pay

## 2021-05-19 MED FILL — Allopurinol Tab 300 MG: ORAL | 90 days supply | Qty: 45 | Fill #1 | Status: AC

## 2021-05-20 ENCOUNTER — Other Ambulatory Visit (HOSPITAL_COMMUNITY): Payer: Self-pay

## 2021-05-28 ENCOUNTER — Other Ambulatory Visit (HOSPITAL_COMMUNITY): Payer: Self-pay

## 2021-05-30 ENCOUNTER — Other Ambulatory Visit (HOSPITAL_COMMUNITY): Payer: Self-pay

## 2021-06-09 ENCOUNTER — Other Ambulatory Visit: Payer: Self-pay | Admitting: Family Medicine

## 2021-06-09 MED FILL — Duloxetine HCl Enteric Coated Pellets Cap 60 MG (Base Eq): ORAL | 90 days supply | Qty: 90 | Fill #1 | Status: CN

## 2021-06-10 ENCOUNTER — Other Ambulatory Visit (HOSPITAL_COMMUNITY): Payer: Self-pay

## 2021-06-10 MED FILL — Duloxetine HCl Enteric Coated Pellets Cap 60 MG (Base Eq): ORAL | 90 days supply | Qty: 90 | Fill #1 | Status: AC

## 2021-06-11 ENCOUNTER — Other Ambulatory Visit: Payer: Self-pay | Admitting: Family Medicine

## 2021-06-11 ENCOUNTER — Other Ambulatory Visit (HOSPITAL_COMMUNITY): Payer: Self-pay

## 2021-06-12 ENCOUNTER — Encounter: Payer: Self-pay | Admitting: Family Medicine

## 2021-06-12 ENCOUNTER — Ambulatory Visit (INDEPENDENT_AMBULATORY_CARE_PROVIDER_SITE_OTHER): Payer: 59 | Admitting: Family Medicine

## 2021-06-12 ENCOUNTER — Other Ambulatory Visit (HOSPITAL_COMMUNITY): Payer: Self-pay

## 2021-06-12 ENCOUNTER — Other Ambulatory Visit (HOSPITAL_COMMUNITY)
Admission: RE | Admit: 2021-06-12 | Discharge: 2021-06-12 | Disposition: A | Discharge status: Home / Self Care | Payer: 59 | Source: Ambulatory Visit | Attending: Family Medicine | Admitting: Family Medicine

## 2021-06-12 VITALS — BP 125/49 | HR 78 | Temp 98.6°F | Ht 66.0 in | Wt 205.0 lb

## 2021-06-12 DIAGNOSIS — Z23 Encounter for immunization: Secondary | ICD-10-CM | POA: Diagnosis not present

## 2021-06-12 DIAGNOSIS — Z124 Encounter for screening for malignant neoplasm of cervix: Secondary | ICD-10-CM | POA: Diagnosis not present

## 2021-06-12 DIAGNOSIS — E1169 Type 2 diabetes mellitus with other specified complication: Secondary | ICD-10-CM

## 2021-06-12 DIAGNOSIS — E669 Obesity, unspecified: Secondary | ICD-10-CM | POA: Diagnosis not present

## 2021-06-12 LAB — POCT UA - MICROALBUMIN
Albumin/Creatinine Ratio, Urine, POC: <30
Creatinine, POC: 200 mg/dL
Microalbumin Ur, POC: 10 mg/L

## 2021-06-12 LAB — POCT GLYCOSYLATED HEMOGLOBIN (HGB A1C): HbA1c, POC (controlled diabetic range): 5.6 % (ref 0.0–7.0)

## 2021-06-12 NOTE — Progress Notes (Signed)
Subjective:     Alexis Mcneil is a 65 y.o. female and is here for a comprehensive physical exam. The patient reports no problems.  She is doing well overall she is planning on retiring in January she is not currently exercising.  She says when she retires for probably spend a lot of time at [employer] she has a place at [employer].  She did well let me know that her neuropathy has been a little bit better since she switched types of socks.  It is really provided a lot of relief for her.  She has been having some low blood sugars.  Social History   Socioeconomic History   Marital status: Single    Spouse name: Not on file   Number of children: Not on file   Years of education: Not on file   Highest education level: Not on file  Occupational History   Not on file  Tobacco Use   Smoking status: Never   Smokeless tobacco: Never  Vaping Use   Vaping Use: Never used  Substance and Sexual Activity   Alcohol use: Yes    Alcohol/week: 0.0 standard drinks    Comment: occasional beer on the weekends.     Drug use: No   Sexual activity: Yes    Birth control/protection: Post-menopausal  Other Topics Concern   Not on file  Social History Narrative   Lives alone in a 2 story home.  Has no children.     Works for [employer].     Education: associates degree.   Social Determinants of Health   Financial Resource Strain: Not on file  Food Insecurity: Not on file  Transportation Needs: Not on file  Physical Activity: Not on file  Stress: Not on file  Social Connections: Not on file  Intimate Partner Violence: Not on file   Health Maintenance  Topic Date Due   HIV Screening  Never done   Pneumococcal Vaccine 60-90 Years old (2 - PCV) 09/22/2016   COVID-19 Vaccine (4 - Booster for Pfizer series) 01/15/2021   PAP SMEAR-Modifier  02/11/2021   FOOT EXAM  05/20/2021   INFLUENZA VACCINE  06/09/2021   HEMOGLOBIN A1C  12/13/2021   OPHTHALMOLOGY EXAM  12/25/2021   MAMMOGRAM   08/23/2022   COLONOSCOPY (Pts 45-46yr Insurance coverage will need to be confirmed)  04/26/2024   TETANUS/TDAP  05/26/2025   Hepatitis C Screening  Completed   Zoster Vaccines- Shingrix  Completed   HPV VACCINES  Aged Out    The following portions of the patient's history were reviewed and updated as appropriate: allergies, current medications, past family history, past medical history, past social history, past surgical history, and problem list.  Review of Systems Pertinent items are noted in HPI.   Objective:    BP (!) 125/49   Pulse 78   Temp 98.6 F (37 C) (Oral)   Ht '5\' 6"'$  (1.676 m)   Wt 205 lb (93 kg)   SpO2 100% Comment: on RA  BMI 33.09 kg/m  General appearance: alert, cooperative, and appears stated age Head: Normocephalic, without obvious abnormality, atraumatic Eyes:  conj clear, eOMI< PEERLA Ears: normal TM's and external ear canals both ears Nose: Nares normal. Septum midline. Mucosa normal. No drainage or sinus tenderness. Throat: lips, mucosa, and tongue normal; teeth and gums normal Neck: no adenopathy, no carotid bruit, no JVD, supple, symmetrical, trachea midline, and thyroid not enlarged, symmetric, no tenderness/mass/nodules Back: symmetric, no curvature. ROM normal. No  CVA tenderness. Lungs: clear to auscultation bilaterally Breasts: normal appearance, no masses or tenderness Heart: regular rate and rhythm, S1, S2 normal, no murmur, click, rub or gallop Abdomen: soft, non-tender; bowel sounds normal; no masses,  no organomegaly Pelvic: cervix normal in appearance, external genitalia normal, no adnexal masses or tenderness, no cervical motion tenderness, rectovaginal septum normal, uterus normal size, shape, and consistency, and vagina normal without discharge Extremities: extremities normal, atraumatic, no cyanosis or edema Pulses: 2+ and symmetric Skin: Skin color, texture, turgor normal. No rashes or lesions Lymph nodes: Cervical, supraclavicular, and  axillary nodes normal. Neurologic: Grossly normal    Assessment:    Healthy female exam.      Plan:     See After Visit Summary for Counseling Recommendations  Keep up a regular exercise program and make sure you are eating a healthy diet Try to eat 4 servings of dairy a day, or if you are lactose intolerant take a calcium with vitamin D daily.  Your vaccines are up to date.  Pap performed today.  Labs are up to date.  Given 2nd Shingrix.   DM- ok to stop metformin. Having hypoglycmemia.  Lab Results  Component Value Date   HGBA1C 5.6 06/12/2021     No orders of the defined types were placed in this encounter. Orders Placed This Encounter  Procedures   Varicella-zoster vaccine IM (Shingrix)   POCT glycosylated hemoglobin (Hb A1C)   POCT UA - Microalbumin

## 2021-06-13 ENCOUNTER — Other Ambulatory Visit (HOSPITAL_COMMUNITY): Payer: Self-pay

## 2021-06-16 ENCOUNTER — Other Ambulatory Visit: Payer: Self-pay | Admitting: Family Medicine

## 2021-06-16 ENCOUNTER — Other Ambulatory Visit: Payer: Self-pay

## 2021-06-16 ENCOUNTER — Encounter: Payer: Self-pay | Admitting: Family Medicine

## 2021-06-16 ENCOUNTER — Other Ambulatory Visit (HOSPITAL_COMMUNITY): Payer: Self-pay

## 2021-06-16 MED ORDER — PANTOPRAZOLE SODIUM 40 MG PO TBEC
40.0000 mg | DELAYED_RELEASE_TABLET | Freq: Every day | ORAL | 1 refills | Status: DC
  Filled 2021-06-16: qty 90, 90d supply, fill #0

## 2021-06-16 MED ORDER — POTASSIUM CHLORIDE CRYS ER 20 MEQ PO TBCR
20.0000 meq | EXTENDED_RELEASE_TABLET | Freq: Two times a day (BID) | ORAL | 1 refills | Status: DC
  Filled 2021-06-16: qty 180, 90d supply, fill #0

## 2021-06-16 NOTE — Addendum Note (Signed)
Addended by: Fonnie Mu on: 06/16/2021 04:12 PM   Modules accepted: Orders

## 2021-06-16 NOTE — Progress Notes (Signed)
Opened in error. T. Serrina Minogue, CMA 

## 2021-06-17 ENCOUNTER — Other Ambulatory Visit (HOSPITAL_COMMUNITY): Payer: Self-pay

## 2021-06-17 ENCOUNTER — Encounter: Payer: Self-pay | Admitting: Family Medicine

## 2021-06-17 ENCOUNTER — Other Ambulatory Visit: Payer: Self-pay | Admitting: *Deleted

## 2021-06-17 MED ORDER — POTASSIUM CHLORIDE CRYS ER 20 MEQ PO TBCR
20.0000 meq | EXTENDED_RELEASE_TABLET | Freq: Two times a day (BID) | ORAL | 1 refills | Status: DC
Start: 2021-06-17 — End: 2021-12-23
  Filled 2021-06-17 – 2021-09-21 (×2): qty 180, 90d supply, fill #0

## 2021-06-17 MED ORDER — PANTOPRAZOLE SODIUM 40 MG PO TBEC
40.0000 mg | DELAYED_RELEASE_TABLET | Freq: Every day | ORAL | 1 refills | Status: DC
  Filled 2021-06-17 – 2021-09-08 (×2): qty 90, 90d supply, fill #0

## 2021-06-18 LAB — CYTOLOGY - PAP
Comment: NEGATIVE
Diagnosis: NEGATIVE
Diagnosis: REACTIVE
High risk HPV: NEGATIVE

## 2021-06-24 ENCOUNTER — Other Ambulatory Visit (HOSPITAL_COMMUNITY): Payer: Self-pay

## 2021-07-10 ENCOUNTER — Other Ambulatory Visit: Payer: Self-pay | Admitting: Family Medicine

## 2021-07-10 DIAGNOSIS — Z1231 Encounter for screening mammogram for malignant neoplasm of breast: Secondary | ICD-10-CM

## 2021-07-14 ENCOUNTER — Other Ambulatory Visit (HOSPITAL_COMMUNITY): Payer: Self-pay

## 2021-07-14 MED FILL — Atorvastatin Calcium Tab 40 MG (Base Equivalent): ORAL | 90 days supply | Qty: 90 | Fill #1 | Status: AC

## 2021-07-14 MED FILL — Chlorthalidone Tab 50 MG: ORAL | 90 days supply | Qty: 180 | Fill #0 | Status: AC

## 2021-07-15 ENCOUNTER — Other Ambulatory Visit (HOSPITAL_COMMUNITY): Payer: Self-pay

## 2021-07-16 ENCOUNTER — Other Ambulatory Visit (HOSPITAL_COMMUNITY): Payer: Self-pay

## 2021-08-05 ENCOUNTER — Other Ambulatory Visit (HOSPITAL_COMMUNITY): Payer: Self-pay

## 2021-08-08 ENCOUNTER — Ambulatory Visit: Payer: 59 | Admitting: Sports Medicine

## 2021-08-11 ENCOUNTER — Other Ambulatory Visit: Payer: Self-pay | Admitting: Family Medicine

## 2021-08-12 ENCOUNTER — Other Ambulatory Visit (HOSPITAL_COMMUNITY): Payer: Self-pay

## 2021-08-12 MED ORDER — MELOXICAM 15 MG PO TABS
ORAL_TABLET | Freq: Every day | ORAL | 1 refills | Status: DC
  Filled 2021-08-12: qty 90, 90d supply, fill #0
  Filled 2021-11-13: qty 90, 90d supply, fill #1

## 2021-08-12 MED ORDER — ALLOPURINOL 300 MG PO TABS
ORAL_TABLET | Freq: Every day | ORAL | 3 refills | Status: DC
  Filled 2021-08-12: qty 45, 90d supply, fill #0
  Filled 2021-11-13: qty 45, 90d supply, fill #1

## 2021-08-14 DIAGNOSIS — G4733 Obstructive sleep apnea (adult) (pediatric): Secondary | ICD-10-CM | POA: Diagnosis not present

## 2021-08-25 ENCOUNTER — Other Ambulatory Visit (HOSPITAL_COMMUNITY): Payer: Self-pay

## 2021-08-26 ENCOUNTER — Other Ambulatory Visit: Payer: Self-pay

## 2021-08-26 ENCOUNTER — Ambulatory Visit
Admission: RE | Admit: 2021-08-26 | Discharge: 2021-08-26 | Disposition: A | Discharge status: Home / Self Care | Payer: 59 | Source: Ambulatory Visit | Attending: Family Medicine | Admitting: Family Medicine

## 2021-08-26 DIAGNOSIS — Z1231 Encounter for screening mammogram for malignant neoplasm of breast: Secondary | ICD-10-CM | POA: Diagnosis not present

## 2021-09-01 NOTE — Progress Notes (Signed)
Please call patient. Normal mammogram.  Repeat in 1 year.  

## 2021-09-08 ENCOUNTER — Other Ambulatory Visit (HOSPITAL_COMMUNITY): Payer: Self-pay

## 2021-09-08 ENCOUNTER — Other Ambulatory Visit: Payer: Self-pay | Admitting: Family Medicine

## 2021-09-08 DIAGNOSIS — G609 Hereditary and idiopathic neuropathy, unspecified: Secondary | ICD-10-CM

## 2021-09-09 ENCOUNTER — Other Ambulatory Visit: Payer: Self-pay | Admitting: Family Medicine

## 2021-09-09 ENCOUNTER — Other Ambulatory Visit (HOSPITAL_COMMUNITY): Payer: Self-pay

## 2021-09-09 DIAGNOSIS — G609 Hereditary and idiopathic neuropathy, unspecified: Secondary | ICD-10-CM

## 2021-09-09 MED FILL — Duloxetine HCl Enteric Coated Pellets Cap 60 MG (Base Eq): ORAL | 90 days supply | Qty: 90 | Fill #0 | Status: AC

## 2021-09-21 ENCOUNTER — Other Ambulatory Visit: Payer: Self-pay | Admitting: Family Medicine

## 2021-09-21 ENCOUNTER — Other Ambulatory Visit (HOSPITAL_COMMUNITY): Payer: Self-pay

## 2021-09-22 ENCOUNTER — Other Ambulatory Visit (HOSPITAL_COMMUNITY): Payer: Self-pay

## 2021-09-22 MED ORDER — LEVOTHYROXINE SODIUM 137 MCG PO TABS
ORAL_TABLET | Freq: Every day | ORAL | 1 refills | Status: DC
  Filled 2021-09-22: qty 90, 90d supply, fill #0
  Filled 2021-12-01: qty 90, 90d supply, fill #1

## 2021-10-12 ENCOUNTER — Other Ambulatory Visit: Payer: Self-pay | Admitting: Family Medicine

## 2021-10-12 MED FILL — Chlorthalidone Tab 50 MG: ORAL | 90 days supply | Qty: 180 | Fill #1 | Status: AC

## 2021-10-13 ENCOUNTER — Other Ambulatory Visit (HOSPITAL_COMMUNITY): Payer: Self-pay

## 2021-10-13 MED ORDER — LOSARTAN POTASSIUM 100 MG PO TABS
100.0000 mg | ORAL_TABLET | Freq: Every day | ORAL | 1 refills | Status: DC
Start: 2021-10-13 — End: 2022-01-12
  Filled 2021-10-13: qty 90, 90d supply, fill #0

## 2021-10-13 MED ORDER — ATORVASTATIN CALCIUM 40 MG PO TABS
ORAL_TABLET | ORAL | 3 refills | Status: DC
  Filled 2021-10-13: qty 90, 90d supply, fill #0

## 2021-10-13 MED ORDER — IBUPROFEN 800 MG PO TABS
ORAL_TABLET | Freq: Three times a day (TID) | ORAL | 2 refills | Status: AC | PRN
  Filled 2021-10-13: qty 60, 20d supply, fill #0

## 2021-10-14 ENCOUNTER — Other Ambulatory Visit (HOSPITAL_COMMUNITY): Payer: Self-pay

## 2021-10-14 ENCOUNTER — Other Ambulatory Visit: Payer: Self-pay

## 2021-10-14 ENCOUNTER — Encounter: Payer: Self-pay | Admitting: Family Medicine

## 2021-10-14 ENCOUNTER — Ambulatory Visit: Payer: 59 | Admitting: Family Medicine

## 2021-10-14 VITALS — BP 133/57 | HR 80 | Resp 18 | Ht 65.0 in | Wt 213.0 lb

## 2021-10-14 DIAGNOSIS — G609 Hereditary and idiopathic neuropathy, unspecified: Secondary | ICD-10-CM

## 2021-10-14 DIAGNOSIS — E1169 Type 2 diabetes mellitus with other specified complication: Secondary | ICD-10-CM | POA: Diagnosis not present

## 2021-10-14 DIAGNOSIS — E038 Other specified hypothyroidism: Secondary | ICD-10-CM | POA: Diagnosis not present

## 2021-10-14 DIAGNOSIS — I10 Essential (primary) hypertension: Secondary | ICD-10-CM

## 2021-10-14 DIAGNOSIS — E669 Obesity, unspecified: Secondary | ICD-10-CM | POA: Diagnosis not present

## 2021-10-14 LAB — POCT GLYCOSYLATED HEMOGLOBIN (HGB A1C): Hemoglobin A1C: 6.4 % — AB (ref 4.0–5.6)

## 2021-10-14 MED ORDER — TIRZEPATIDE 2.5 MG/0.5ML ~~LOC~~ SOAJ
2.5000 mg | SUBCUTANEOUS | 0 refills | Status: DC
  Filled 2021-10-14: qty 2, 28d supply, fill #0

## 2021-10-14 NOTE — Assessment & Plan Note (Signed)
Due to recheck thyroid level.  

## 2021-10-14 NOTE — Assessment & Plan Note (Signed)
Repeat blood pressure looks much better today we will keep an eye on it.  Continue current regimen.

## 2021-10-14 NOTE — Progress Notes (Signed)
Patient would like Pneumonia vaccine today.

## 2021-10-14 NOTE — Assessment & Plan Note (Addendum)
A1c jumped up to 6.4.  Previous looked great at 5.6.  She has been stress eating and traveling more.  We discussed just trying to make sure that she is taking care of herself especially as she has a lot on her plate right now.  Her knee and hip pain has kept her from exercising.  But plans on following up with our sports med doc for this soon.  Continue ARB and statin.  Ozempic is on backorder and she can no longer get it though she has been doing really well on the 0.5 mg dose.  So I sent over a new prescription for Mounjaro 2.5 mg pen.  Medical ahead and order a 90-day supply.  When she switches back to Medicare she is not sure how well this will be covered and we may need to put her back on metformin.

## 2021-10-14 NOTE — Progress Notes (Signed)
Established Patient Office Visit  Subjective:  Patient ID: Alexis Mcneil, female    DOB: Jan 03, 1956  Age: 65 y.o. MRN: [id number]  CC:  Chief Complaint  Patient presents with   Diabetes    Follow up    Referral    Podiatrist due to burning in feet. Worse for 3 months.    Discuss Medications    Discuss refilling medications for 90 day supply. Will be switching to Medicare in 11/2021    HPI [name] presents for   Diabetes - no hypoglycemic events. No wounds or sores that are not healing well. No increased thirst or urination. Checking glucose at home. Taking medications as prescribed without any side effects.  She admits that she has been eating a lot more foods that are carbs and sweets.  She has been traveling back and forth to her mom's every other weekend as she has declined and she is now on hospice.  She would like a referral to podiatry.  She has had neuropathy in her feet for years.  Some days it is better other days it just feels like a hot burning sensation.  She has not been able to really find a very consistent trigger.  But she is at the point but she would like consultation.  She is currently on Cymbalta and does feel like it helps.  Is also been dealing with a lot of back, knee, and hip pain.  She is at the point where she might consider an injection in her back again.  She is also had injections in both hip bursa's and that was helpful as well.  Her weight is up 8 lbs from when she was last here.    Past Medical History:  Diagnosis Date   Anemia    Arthritis    KNEE,SHOULDERS   Diabetes mellitus without complication (HCC)    GERD (gastroesophageal reflux disease)    Heart murmur    Hyperlipidemia    Hypertension    IFG (impaired fasting glucose)    Neuromuscular disorder (HCC)    NEUROPATHY FEET   OSA (obstructive sleep apnea)    CPAP   Sleep apnea    CPAP   Thyroid disease     Past Surgical History:  Procedure Laterality Date   COLONOSCOPY      endometrial polyp     January 2014   mouth palate surgery repair  1961   large tear   ROOT CANEL     2 WEEKS AGO   rt knee surgery arthroscopy  2009   torn meniscus/medial   torn ligament shoulder  1975   repaired    Family History  Problem Relation Age of Onset   Colon cancer Father 34       colon/ polyps   Heart attack Father    Stroke Father    Hyperlipidemia Mother        Living   Hypertension Mother    Colon cancer Paternal Grandmother    Stroke Paternal Grandmother    Heart attack Paternal Grandmother        heart attack   Diabetes Other        grandmother   Supraventricular tachycardia Sister    Esophageal cancer Neg Hx    Stomach cancer Neg Hx    Rectal cancer Neg Hx    Breast cancer Neg Hx     Social History   Socioeconomic History   Marital status: Single    Spouse name: Not  on file   Number of children: Not on file   Years of education: Not on file   Highest education level: Not on file  Occupational History   Not on file  Tobacco Use   Smoking status: Never   Smokeless tobacco: Never  Vaping Use   Vaping Use: Never used  Substance and Sexual Activity   Alcohol use: Yes    Alcohol/week: 0.0 standard drinks    Comment: occasional beer on the weekends.     Drug use: No   Sexual activity: Yes    Birth control/protection: Post-menopausal  Other Topics Concern   Not on file  Social History Narrative   Lives alone in a 2 story home.  Has no children.     Works for [employer].     Education: associates degree.   Social Determinants of Health   Financial Resource Strain: Not on file  Food Insecurity: Not on file  Transportation Needs: Not on file  Physical Activity: Not on file  Stress: Not on file  Social Connections: Not on file  Intimate Partner Violence: Not on file    Outpatient Medications Prior to Visit  Medication Sig Dispense Refill   allopurinol (ZYLOPRIM) 300 MG tablet TAKE 1/2 TABLET BY MOUTH DAILY 45 tablet 3    AMBULATORY NON FORMULARY MEDICATION Medication Name: CPAP machine and supplies with humidifier.  Please set her CPAP to 9 cm water pressure. Then download after one week so can review her apneas on 9 cm water pressure.  [facility]. Dx. OSA G47.33 1 vial 0   atorvastatin (LIPITOR) 40 MG tablet TAKE 1 TABLET BY MOUTH EVERY EVENING AT 6PM 90 tablet 3   Blood Glucose Monitoring Suppl (FREESTYLE LITE) DEVI      chlorthalidone (HYGROTON) 50 MG tablet TAKE 2 TABLETS BY MOUTH DAILY. 180 tablet 3   DULoxetine (CYMBALTA) 60 MG capsule TAKE 1 CAPSULE BY MOUTH DAILY 90 capsule 1   glucose blood test strip Freestyle Lite test strips.  FOR CHECKING BLOOD SUGAR ONCE DAILY 100 each 1   ibuprofen (ADVIL) 800 MG tablet TAKE 1 TABLET BY MOUTH 3 TIMES DAILY AS NEEDED 60 tablet 2   Lancets Misc. (ACCU-CHEK FASTCLIX LANCET) KIT Use to check blood sugar once daily. Dx code: E11.69 1 kit 1   levothyroxine (SYNTHROID) 137 MCG tablet TAKE 1 TABLET BY MOUTH ONCE DAILY BEFORE BREAKFAST 90 tablet 1   losartan (COZAAR) 100 MG tablet TAKE 1 TABLET BY MOUTH ONCE DAILY 90 tablet 1   meloxicam (MOBIC) 15 MG tablet TAKE 1 TABLET BY MOUTH ONCE DAILY 90 tablet 1   pantoprazole (PROTONIX) 40 MG tablet Take 1 tablet (40 mg total) by mouth daily. 90 tablet 1   potassium chloride SA (KLOR-CON) 20 MEQ tablet Take 1 tablet (20 mEq total) by mouth 2 (two) times daily. 180 tablet 1   Semaglutide,0.25 or 0.5MG/DOS, 2 MG/1.5ML SOPN Inject 0.5 mg into the skin once a week. 4.5 mL 3   No facility-administered medications prior to visit.    Allergies  Allergen Reactions   Niacin And Related Nausea And Vomiting    Passed out and threw up   Gabapentin Other (See Comments)    Vision changes    ROS Review of Systems    Objective:    Physical Exam Constitutional:      Appearance: Normal appearance. She is well-developed.  HENT:     Head: Normocephalic and atraumatic.  Cardiovascular:     Rate and Rhythm: Normal  rate and regular  rhythm.     Heart sounds: Normal heart sounds.  Pulmonary:     Effort: Pulmonary effort is normal.     Breath sounds: Normal breath sounds.  Skin:    General: Skin is warm and dry.  Neurological:     Mental Status: She is alert and oriented to person, place, and time.  Psychiatric:        Behavior: Behavior normal.    BP (!) 133/57   Pulse 80   Resp 18   Ht '5\' 5"'  (1.651 m)   Wt 213 lb (96.6 kg)   SpO2 96%   BMI 35.45 kg/m  Wt Readings from Last 3 Encounters:  10/14/21 213 lb (96.6 kg)  06/12/21 205 lb (93 kg)  03/11/21 205 lb (93 kg)     Health Maintenance Due  Topic Date Due   HIV Screening  Never done   Pneumonia Vaccine 39+ Years old (2 - PCV) 09/22/2016    There are no preventive care reminders to display for this patient.  Lab Results  Component Value Date   TSH 1.864 01/19/2020   Lab Results  Component Value Date   WBC 6.4 03/18/2021   HGB 13.2 03/18/2021   HCT 39.1 03/18/2021   MCV 88.9 03/18/2021   PLT 247 03/18/2021   Lab Results  Component Value Date   NA 138 03/18/2021   K 3.3 (L) 03/18/2021   CO2 31 03/18/2021   GLUCOSE 124 (H) 03/18/2021   BUN 18 03/18/2021   CREATININE 1.02 (H) 03/18/2021   BILITOT 0.8 03/18/2021   ALKPHOS 80 03/18/2021   AST 18 03/18/2021   ALT 22 03/18/2021   PROT 7.5 03/18/2021   ALBUMIN 4.2 03/18/2021   CALCIUM 9.5 03/18/2021   ANIONGAP 8 03/18/2021   Lab Results  Component Value Date   CHOL 191 03/18/2021   Lab Results  Component Value Date   HDL 52 03/18/2021   Lab Results  Component Value Date   LDLCALC 116 (H) 03/18/2021   Lab Results  Component Value Date   TRIG 117 03/18/2021   Lab Results  Component Value Date   CHOLHDL 3.7 03/18/2021   Lab Results  Component Value Date   HGBA1C 6.4 (A) 10/14/2021      Assessment & Plan:   Problem List Items Addressed This Visit       Cardiovascular and Mediastinum   HYPERTENSION, BENIGN ESSENTIAL    Repeat blood pressure looks much better today  we will keep an eye on it.  Continue current regimen.      Relevant Orders   TSH   BASIC METABOLIC PANEL WITH GFR     Endocrine   Diabetes mellitus type 2 in obese (HCC) - Primary    A1c jumped up to 6.4.  Previous looked great at 5.6.  She has been stress eating and traveling more.  We discussed just trying to make sure that she is taking care of herself especially as she has a lot on her plate right now.  Her knee and hip pain has kept her from exercising.  But plans on following up with our sports med doc for this soon.  Continue ARB and statin.  Ozempic is on backorder and she can no longer get it though she has been doing really well on the 0.5 mg dose.  So I sent over a new prescription for Mounjaro 2.5 mg pen.  Medical ahead and order a 90-day supply.  When she switches back  to Medicare she is not sure how well this will be covered and we may need to put her back on metformin.      Relevant Medications   tirzepatide (MOUNJARO) 2.5 MG/0.5ML Pen   Other Relevant Orders   POCT glycosylated hemoglobin (Hb A1C) (Completed)   TSH   BASIC METABOLIC PANEL WITH GFR     Nervous and Auditory   Hereditary and idiopathic peripheral neuropathy   Relevant Orders   Ambulatory referral to Podiatry   TSH   BASIC METABOLIC PANEL WITH GFR    Meds ordered this encounter  Medications   tirzepatide (MOUNJARO) 2.5 MG/0.5ML Pen    Sig: Inject 2.5 mg into the skin once a week.    Dispense:  6 mL    Refill:  0     Follow-up: Return in about 3 months (around 01/19/2022) for Diabetes follow-up.    Beatrice Lecher, MD

## 2021-10-15 ENCOUNTER — Other Ambulatory Visit (HOSPITAL_COMMUNITY): Payer: Self-pay

## 2021-10-16 ENCOUNTER — Other Ambulatory Visit (HOSPITAL_COMMUNITY): Payer: Self-pay

## 2021-10-16 ENCOUNTER — Encounter: Payer: Self-pay | Admitting: Family Medicine

## 2021-10-16 DIAGNOSIS — E669 Obesity, unspecified: Secondary | ICD-10-CM

## 2021-10-16 DIAGNOSIS — E1169 Type 2 diabetes mellitus with other specified complication: Secondary | ICD-10-CM

## 2021-10-17 ENCOUNTER — Other Ambulatory Visit (HOSPITAL_COMMUNITY): Payer: Self-pay

## 2021-10-17 MED ORDER — TIRZEPATIDE 5 MG/0.5ML ~~LOC~~ SOAJ
5.0000 mg | SUBCUTANEOUS | 0 refills | Status: DC
  Filled 2021-10-17 – 2021-11-07 (×2): qty 2, 28d supply, fill #0

## 2021-10-24 ENCOUNTER — Other Ambulatory Visit (HOSPITAL_COMMUNITY): Payer: Self-pay

## 2021-10-24 ENCOUNTER — Other Ambulatory Visit: Payer: Self-pay

## 2021-10-24 ENCOUNTER — Ambulatory Visit (INDEPENDENT_AMBULATORY_CARE_PROVIDER_SITE_OTHER): Payer: 59

## 2021-10-24 ENCOUNTER — Ambulatory Visit: Payer: 59 | Admitting: Sports Medicine

## 2021-10-24 DIAGNOSIS — M48062 Spinal stenosis, lumbar region with neurogenic claudication: Secondary | ICD-10-CM

## 2021-10-24 DIAGNOSIS — M545 Low back pain, unspecified: Secondary | ICD-10-CM

## 2021-10-24 DIAGNOSIS — M2578 Osteophyte, vertebrae: Secondary | ICD-10-CM | POA: Diagnosis not present

## 2021-10-24 MED ORDER — PREDNISONE 50 MG PO TABS
ORAL_TABLET | ORAL | 0 refills | Status: DC
  Filled 2021-10-24: qty 5, 5d supply, fill #0

## 2021-10-24 NOTE — Progress Notes (Signed)
° ° °  Procedures performed today:    None.  Independent interpretation of notes and tests performed by another provider:   None.  Brief History, Exam, Impression, and Recommendations:    Lumbar spinal stenosis This is a pleasant 65 year old female, long history of axial back pain, occasional radicular symptoms down the left leg. Back in 2015 we did obtain an MRI, there was moderate L4-L5 central canal stenosis. In January of the same year she had a left L4-L5 transforaminal epidural that seem to provide good relief. Unfortunately now having recurrence of pain in the back and hips, occasional radiation down the left leg. Symptoms are better with flexion and worse with extension as it is classic with lumbar spinal stenosis. We will start conservatively, 5 days of prednisone, updated x-rays, home physical therapy, return to see me in about 6 weeks, in 6 weeks if insufficiently better we will proceed with MRI and an updated epidural injection. There was some discomfort along the medial knee as well on the left, likely due to osteoarthritis, we will further evaluate this if needed at the follow-up visit.    ___________________________________________ Gwen Her. Dianah Field, M.D., ABFM., CAQSM. Primary Care and [place] Instructor of [place] [facility]

## 2021-10-24 NOTE — Assessment & Plan Note (Signed)
This is a pleasant 65 year old female, long history of axial back pain, occasional radicular symptoms down the left leg. Back in 2015 we did obtain an MRI, there was moderate L4-L5 central canal stenosis. In January of the same year she had a left L4-L5 transforaminal epidural that seem to provide good relief. Unfortunately now having recurrence of pain in the back and hips, occasional radiation down the left leg. Symptoms are better with flexion and worse with extension as it is classic with lumbar spinal stenosis. We will start conservatively, 5 days of prednisone, updated x-rays, home physical therapy, return to see me in about 6 weeks, in 6 weeks if insufficiently better we will proceed with MRI and an updated epidural injection. There was some discomfort along the medial knee as well on the left, likely due to osteoarthritis, we will further evaluate this if needed at the follow-up visit.

## 2021-11-07 ENCOUNTER — Other Ambulatory Visit: Payer: Self-pay | Admitting: *Deleted

## 2021-11-07 ENCOUNTER — Other Ambulatory Visit (HOSPITAL_COMMUNITY): Payer: Self-pay

## 2021-11-07 ENCOUNTER — Other Ambulatory Visit: Payer: Self-pay | Admitting: Family Medicine

## 2021-11-10 ENCOUNTER — Other Ambulatory Visit (HOSPITAL_COMMUNITY): Payer: Self-pay

## 2021-11-10 MED ORDER — OZEMPIC (0.25 OR 0.5 MG/DOSE) 2 MG/1.5ML ~~LOC~~ SOPN
0.5000 mg | PEN_INJECTOR | SUBCUTANEOUS | 3 refills | Status: AC
  Filled 2021-11-10: qty 4.5, 84d supply, fill #0

## 2021-11-11 ENCOUNTER — Other Ambulatory Visit (HOSPITAL_COMMUNITY): Payer: Self-pay

## 2021-11-11 ENCOUNTER — Other Ambulatory Visit (HOSPITAL_COMMUNITY)
Admit: 2021-11-11 | Discharge: 2021-11-11 | Disposition: A | Discharge status: Home / Self Care | Payer: 59 | Source: Ambulatory Visit | Attending: Family Medicine | Admitting: Family Medicine

## 2021-11-11 DIAGNOSIS — E669 Obesity, unspecified: Secondary | ICD-10-CM | POA: Diagnosis not present

## 2021-11-11 DIAGNOSIS — I10 Essential (primary) hypertension: Secondary | ICD-10-CM | POA: Insufficient documentation

## 2021-11-11 DIAGNOSIS — E038 Other specified hypothyroidism: Secondary | ICD-10-CM | POA: Insufficient documentation

## 2021-11-11 DIAGNOSIS — Z79899 Other long term (current) drug therapy: Secondary | ICD-10-CM | POA: Insufficient documentation

## 2021-11-11 DIAGNOSIS — E1169 Type 2 diabetes mellitus with other specified complication: Secondary | ICD-10-CM | POA: Diagnosis not present

## 2021-11-11 DIAGNOSIS — G609 Hereditary and idiopathic neuropathy, unspecified: Secondary | ICD-10-CM | POA: Diagnosis not present

## 2021-11-11 LAB — TSH: TSH: 1.189 u[IU]/mL (ref 0.350–4.500)

## 2021-11-11 LAB — BASIC METABOLIC PANEL
Anion gap: 8 (ref 5–15)
BUN: 22 mg/dL (ref 8–23)
CO2: 31 mmol/L (ref 22–32)
Calcium: 9.6 mg/dL (ref 8.9–10.3)
Chloride: 98 mmol/L (ref 98–111)
Creatinine, Ser: 1.06 mg/dL — ABNORMAL HIGH (ref 0.44–1.00)
GFR, Estimated: 58 mL/min — ABNORMAL LOW (ref >60)
Glucose, Bld: 142 mg/dL — ABNORMAL HIGH (ref 70–99)
Potassium: 3.1 mmol/L — ABNORMAL LOW (ref 3.5–5.1)
Sodium: 137 mmol/L (ref 135–145)

## 2021-11-12 ENCOUNTER — Encounter: Payer: Self-pay | Admitting: Family Medicine

## 2021-11-12 DIAGNOSIS — N183 Chronic kidney disease, stage 3 unspecified: Secondary | ICD-10-CM | POA: Insufficient documentation

## 2021-11-12 NOTE — Progress Notes (Signed)
Hi Alexis Mcneil, your thyroid looks perfect at 1.1.  Your potassium has dropped back down to 3.1.  Just make sure you are taking your potassium twice a day.  So I would really like for you to try to decrease her chlorthalidone down to 1 tab a day.  I think you typically take 2.  But honestly most of the effectiveness of the drug is at 50 mg daily when she go much above that you are really not getting a lot of bang for your buck.  It is just not as effective.  Should really try to cut back down to 1.  If you are noticing a little bit more swelling by doing so then you could start by alternating so taking 1 tab on the first day and 2 tabs on the second day and then continuing to alternate that pattern until your kidneys can readjust to the new dose.  And then try to transition down to once a day.  If we need to adjust your dose then let me know.  It had gone back up to 3.3 last time but it seems to be taking a dip again.  Kidney function is stable at 1.0, which is great but you do have some impairment to your kidney function.  Again I think trying to cut back a little bit on the chlorthalidone might help as well.

## 2021-11-13 ENCOUNTER — Other Ambulatory Visit (HOSPITAL_COMMUNITY): Payer: Self-pay

## 2021-11-20 ENCOUNTER — Other Ambulatory Visit (HOSPITAL_COMMUNITY): Payer: Self-pay

## 2021-11-20 ENCOUNTER — Other Ambulatory Visit: Payer: Self-pay

## 2021-11-20 ENCOUNTER — Ambulatory Visit: Payer: 59 | Admitting: Podiatry

## 2021-11-20 ENCOUNTER — Encounter: Payer: Self-pay | Admitting: Podiatry

## 2021-11-20 DIAGNOSIS — G609 Hereditary and idiopathic neuropathy, unspecified: Secondary | ICD-10-CM

## 2021-11-20 DIAGNOSIS — E1169 Type 2 diabetes mellitus with other specified complication: Secondary | ICD-10-CM

## 2021-11-20 DIAGNOSIS — E669 Obesity, unspecified: Secondary | ICD-10-CM

## 2021-11-20 DIAGNOSIS — B351 Tinea unguium: Secondary | ICD-10-CM | POA: Diagnosis not present

## 2021-11-20 MED ORDER — CICLOPIROX 8 % EX SOLN
Freq: Every day | CUTANEOUS | 0 refills | Status: DC
  Filled 2021-11-20: qty 6.6, 30d supply, fill #0

## 2021-11-20 MED ORDER — CAPSAICIN 0.05 % EX CREA
1.0000 g | TOPICAL_CREAM | Freq: Every day | CUTANEOUS | 2 refills | Status: DC
  Filled 2021-11-20: qty 57, fill #0

## 2021-11-20 NOTE — Progress Notes (Signed)
°  Subjective:  Patient ID: Alexis Mcneil, female    DOB: 1955/12/26,   MRN: [id number]  Chief Complaint  Patient presents with   Foot Problem    Pt his here for Bil foot pain. Pt is a diabetic and was referred by her PCP. Reports stinging, tingling sensation on both feet. Takes Duloxetine.      66 y.o. female presents for concern of neuropathy in her feet. Relates burning, numbness and tingling in her feet This has been going on for about 1-2 years. She is currently taking duloxetine which does help some. Has tried gabapentin and lyrica in the past. Has been seen by PCP and neurology for neuropathy pain. She also has a history of back issues around L4-L5. Also relates some discoloration on her left great toe. Patient is diabetic and her last A1c was 6.4 on 10/14/21 . Also has had uric acid levels elevated in the past.  Denies any other pedal complaints. Denies n/v/f/c.   Past Medical History:  Diagnosis Date   Anemia    Arthritis    KNEE,SHOULDERS   Diabetes mellitus without complication (HCC)    GERD (gastroesophageal reflux disease)    Heart murmur    Hyperlipidemia    Hypertension    IFG (impaired fasting glucose)    Neuromuscular disorder (HCC)    NEUROPATHY FEET   OSA (obstructive sleep apnea)    CPAP   Sleep apnea    CPAP   Thyroid disease     Objective:  Physical Exam: Vascular: DP/PT pulses 2/4 bilateral. CFT <5 seconds. Normal hair growth on digits. No edema.  Skin. No lacerations or abrasions bilateral feet. Left hallux nail thickened and discolored with subungual debris.  Musculoskeletal: MMT 5/5 bilateral lower extremities in DF, PF, Inversion and Eversion. Deceased ROM in DF of ankle joint. No pain to palpation of the feet.  Neurological: Sensation intact to light touch. Vibratory and protective sensation diminished.   Assessment:   1. Diabetes mellitus type 2 in obese ([name])   2. Hereditary and idiopathic peripheral neuropathy   3. Onychomycosis      Plan:   Patient was evaluated and treated and all questions answered. Discussed neuropathy and etiology as well as treatment with patient.  Discussed diabetic neruopathy vs radiculopathy vs idiopathic causes. No concern for anything mechanical going on in the foot.  -Discussed and educated patient on diabetic foot care, especially with  regards to the vascular, neurological and musculoskeletal systems.  -Stressed the importance of good glycemic control and the detriment of not  controlling glucose levels in relation to the foot. -Discussed supportive shoes at all times and checking feet regularly.  -Follow-up with PCP for prescription management of Cymbalta  -Prescription for capsaicin cream provided.  -Prescription for penlac provided for fungal nails.  -Patient to return in one year for diabetic foot exam    Lorenda Peck, DPM

## 2021-12-01 MED FILL — Duloxetine HCl Enteric Coated Pellets Cap 60 MG (Base Eq): ORAL | 90 days supply | Qty: 90 | Fill #1 | Status: AC

## 2021-12-03 ENCOUNTER — Other Ambulatory Visit (HOSPITAL_COMMUNITY): Payer: Self-pay

## 2021-12-04 ENCOUNTER — Ambulatory Visit (INDEPENDENT_AMBULATORY_CARE_PROVIDER_SITE_OTHER): Payer: 59

## 2021-12-04 ENCOUNTER — Ambulatory Visit: Payer: 59 | Admitting: Sports Medicine

## 2021-12-04 ENCOUNTER — Other Ambulatory Visit: Payer: Self-pay

## 2021-12-04 DIAGNOSIS — M1711 Unilateral primary osteoarthritis, right knee: Secondary | ICD-10-CM

## 2021-12-04 DIAGNOSIS — M48062 Spinal stenosis, lumbar region with neurogenic claudication: Secondary | ICD-10-CM

## 2021-12-04 DIAGNOSIS — M25561 Pain in right knee: Secondary | ICD-10-CM

## 2021-12-04 DIAGNOSIS — Z09 Encounter for follow-up examination after completed treatment for conditions other than malignant neoplasm: Secondary | ICD-10-CM | POA: Diagnosis not present

## 2021-12-04 DIAGNOSIS — M170 Bilateral primary osteoarthritis of knee: Secondary | ICD-10-CM | POA: Diagnosis not present

## 2021-12-04 NOTE — Assessment & Plan Note (Signed)
Increasing pain right knee, medial joint line, x-rays back in 2015 showed severe osteoarthritis. Adding updated x-rays today, steroid injection today, return to see me as needed.

## 2021-12-04 NOTE — Progress Notes (Signed)
° ° °  Procedures performed today:    Procedure: Real-time Ultrasound Guided injection of the right knee Device: Samsung [id number]  Verbal informed consent obtained.  Time-out conducted.  Noted no overlying erythema, induration, or other signs of local infection.  Skin prepped in a sterile fashion.  Local anesthesia: Topical Ethyl chloride.  With sterile technique and under real time ultrasound guidance: Noted effusion, 1 cc Kenalog 40, 2 cc lidocaine, 2 cc bupivacaine injected easily Completed without difficulty  Advised to call if fevers/chills, erythema, induration, drainage, or persistent bleeding.  Images permanently stored and available for review in PACS.  Impression: Technically successful ultrasound guided injection.  Independent interpretation of notes and tests performed by another provider:   None.  Brief History, Exam, Impression, and Recommendations:    Primary osteoarthritis of right knee Increasing pain right knee, medial joint line, x-rays back in 2015 showed severe osteoarthritis. Adding updated x-rays today, steroid injection today, return to see me as needed.  Lumbar spinal stenosis Pleasant 66 year old female, long history of axial low back pain with occasional left-sided radicular symptoms, MRI back in 2015 showed moderate L4-L5 central canal stenosis, in January of the same year she had a left L4-L5 transforaminal epidural that provided good relief, we try to her conservatively, prednisone, home physical therapy for greater than 6 weeks, updated x-rays, she has not improved so we will proceed with MRI for interventional planning, likely a left L4-L5 transforaminal epidural again. I can order the epidural after I see the MRI results. I would like to see her back 1 month after the injection.    ___________________________________________ Gwen Her. Dianah Field, M.D., ABFM., CAQSM. Primary Care and [place]  Instructor of [place] of [facility]

## 2021-12-04 NOTE — Assessment & Plan Note (Signed)
Pleasant 66 year old female, long history of axial low back pain with occasional left-sided radicular symptoms, MRI back in 2015 showed moderate L4-L5 central canal stenosis, in January of the same year she had a left L4-L5 transforaminal epidural that provided good relief, we try to her conservatively, prednisone, home physical therapy for greater than 6 weeks, updated x-rays, she has not improved so we will proceed with MRI for interventional planning, likely a left L4-L5 transforaminal epidural again. I can order the epidural after I see the MRI results. I would like to see her back 1 month after the injection.

## 2021-12-07 ENCOUNTER — Other Ambulatory Visit: Payer: Self-pay

## 2021-12-07 ENCOUNTER — Ambulatory Visit (INDEPENDENT_AMBULATORY_CARE_PROVIDER_SITE_OTHER): Payer: 59

## 2021-12-07 DIAGNOSIS — M48062 Spinal stenosis, lumbar region with neurogenic claudication: Secondary | ICD-10-CM

## 2021-12-07 DIAGNOSIS — M549 Dorsalgia, unspecified: Secondary | ICD-10-CM

## 2021-12-07 DIAGNOSIS — M5136 Other intervertebral disc degeneration, lumbar region: Secondary | ICD-10-CM | POA: Diagnosis not present

## 2021-12-07 DIAGNOSIS — M545 Low back pain, unspecified: Secondary | ICD-10-CM | POA: Diagnosis not present

## 2021-12-07 DIAGNOSIS — M48061 Spinal stenosis, lumbar region without neurogenic claudication: Secondary | ICD-10-CM | POA: Diagnosis not present

## 2021-12-08 ENCOUNTER — Emergency Department
Admission: RE | Admit: 2021-12-08 | Discharge: 2021-12-08 | Disposition: A | Discharge status: Home / Self Care | Payer: 59 | Source: Ambulatory Visit | Attending: Family Medicine | Admitting: Family Medicine

## 2021-12-08 ENCOUNTER — Emergency Department (INDEPENDENT_AMBULATORY_CARE_PROVIDER_SITE_OTHER): Payer: 59

## 2021-12-08 ENCOUNTER — Other Ambulatory Visit: Payer: Self-pay

## 2021-12-08 VITALS — BP 135/77 | HR 74 | Temp 97.7°F | Resp 14 | Ht 65.0 in | Wt 213.0 lb

## 2021-12-08 DIAGNOSIS — R062 Wheezing: Secondary | ICD-10-CM

## 2021-12-08 DIAGNOSIS — R059 Cough, unspecified: Secondary | ICD-10-CM | POA: Diagnosis not present

## 2021-12-08 DIAGNOSIS — J209 Acute bronchitis, unspecified: Secondary | ICD-10-CM | POA: Diagnosis not present

## 2021-12-08 MED ORDER — PREDNISONE 20 MG PO TABS: 20.0000 mg | ORAL_TABLET | Freq: Two times a day (BID) | ORAL | 0 refills | Status: DC

## 2021-12-08 MED ORDER — ALBUTEROL SULFATE HFA 108 (90 BASE) MCG/ACT IN AERS: 2.0000 | INHALATION_SPRAY | Freq: Once | RESPIRATORY_TRACT | Status: DC

## 2021-12-08 MED ORDER — AZITHROMYCIN 250 MG PO TABS: 250.0000 mg | ORAL_TABLET | Freq: Every day | ORAL | 0 refills | Status: DC

## 2021-12-08 NOTE — ED Provider Notes (Signed)
[facility]    CSN: [id number] Arrival date & time: 12/08/21  1258      History   Chief Complaint Chief Complaint  Patient presents with   Nasal Congestion   Cough    HPI Alexis Mcneil is a 66 y.o. female.   HPI Patient states she has had an upper respiratory infection with cough and congestion for just over a week.  She is coughing up some clear mucus.  She states that she is short of breath.  She has chest pain with coughing.  She has been tired.  She has done COVID testing at home.  It was negative.  She has not been around anyone that is ill.  No underlying lung disease such as asthma or emphysema.  Non-smoker Past Medical History:  Diagnosis Date   Anemia    Arthritis    KNEE,SHOULDERS   Diabetes mellitus without complication (HCC)    GERD (gastroesophageal reflux disease)    Heart murmur    Hyperlipidemia    Hypertension    IFG (impaired fasting glucose)    Neuromuscular disorder (HCC)    NEUROPATHY FEET   OSA (obstructive sleep apnea)    CPAP   Sleep apnea    CPAP   Thyroid disease     Patient Active Problem List   Diagnosis Date Noted   Primary osteoarthritis of right knee 12/04/2021   CKD (chronic kidney disease) stage 3, GFR 30-59 ml/min (HCC) 11/12/2021   Pre-ulcerative corn or callous 05/20/2020   Excessive sweating 05/20/2020   Gout 03/07/2018   Hereditary and idiopathic peripheral neuropathy 09/24/2015   Trochanteric bursitis of both hips 10/08/2014   Lumbar spinal stenosis 09/06/2014   Right knee pain 09/06/2014   Obstructive sleep apnea 09/26/2010   SHOULDER PAIN, LEFT 03/18/2010   PARESTHESIA, HANDS 03/18/2010   Diabetes mellitus type 2 in obese ([place]) 02/07/2008   Hypothyroidism 01/06/2007   Hyperlipidemia 01/06/2007   ANEMIA NOS 01/06/2007   HYPERTENSION, BENIGN ESSENTIAL 01/06/2007    Past Surgical History:  Procedure Laterality Date   COLONOSCOPY     endometrial polyp     January 2014   mouth palate surgery repair   1961   large tear   ROOT CANEL     2 WEEKS AGO   rt knee surgery arthroscopy  2009   torn meniscus/medial   torn ligament shoulder  1975   repaired    OB History   No obstetric history on file.      Home Medications    Prior to Admission medications   Medication Sig Start Date End Date Taking? Authorizing Provider  azithromycin (ZITHROMAX) 250 MG tablet Take 1 tablet (250 mg total) by mouth daily. Take first 2 tablets together, then 1 every day until finished. 12/08/21  Yes Raylene Everts, MD  predniSONE (DELTASONE) 20 MG tablet Take 1 tablet (20 mg total) by mouth 2 (two) times daily with a meal. 12/08/21  Yes Raylene Everts, MD  allopurinol (ZYLOPRIM) 300 MG tablet TAKE 1/2 TABLET BY MOUTH DAILY 08/12/21 08/12/22  Hali Marry, MD  AMBULATORY NON FORMULARY MEDICATION Medication Name: CPAP machine and supplies with humidifier.  Please set her CPAP to 9 cm water pressure. Then download after one week so can review her apneas on 9 cm water pressure.  [facility]. Dx. OSA G47.33 12/08/16   Hali Marry, MD  atorvastatin (LIPITOR) 40 MG tablet TAKE 1 TABLET BY MOUTH EVERY EVENING AT [facility] 10/13/21 10/13/22  Metheney,  Rene Kocher, MD  Blood Glucose Monitoring Suppl (FREESTYLE LITE) DEVI  11/06/19   [provider]  Capsaicin 0.05 % CREA Apply 1 gram topically daily. 11/20/21   Lorenda Peck, MD  chlorthalidone (HYGROTON) 50 MG tablet TAKE 2 TABLETS BY MOUTH DAILY. 01/28/21 01/28/22  Hali Marry, MD  ciclopirox (PENLAC) 8 % solution Apply topically at bedtime. Apply over nail and surrounding skin. Apply daily over previous coat. After 7 days, may remove with alcohol and continue cycle. 11/20/21   Lorenda Peck, MD  DULoxetine (CYMBALTA) 60 MG capsule TAKE 1 CAPSULE BY MOUTH DAILY 09/09/21 09/09/22  Hali Marry, MD  glucose blood test strip Freestyle Lite test strips.  FOR CHECKING BLOOD SUGAR ONCE DAILY 02/20/21   Breeback, Jade L, PA-C   ibuprofen (ADVIL) 800 MG tablet TAKE 1 TABLET BY MOUTH 3 TIMES DAILY AS NEEDED 10/13/21 10/13/22  Hali Marry, MD  Lancets Misc. (ACCU-CHEK FASTCLIX LANCET) KIT Use to check blood sugar once daily. Dx code: E11.69 02/16/17   Hali Marry, MD  levothyroxine (SYNTHROID) 137 MCG tablet TAKE 1 TABLET BY MOUTH ONCE DAILY BEFORE BREAKFAST 09/22/21 09/22/22  Hali Marry, MD  losartan (COZAAR) 100 MG tablet TAKE 1 TABLET BY MOUTH ONCE DAILY 10/13/21 10/13/22  Hali Marry, MD  meloxicam (MOBIC) 15 MG tablet TAKE 1 TABLET BY MOUTH ONCE DAILY 08/12/21 08/12/22  Hali Marry, MD  pantoprazole (PROTONIX) 40 MG tablet Take 1 tablet (40 mg total) by mouth daily. 06/17/21   Hali Marry, MD  potassium chloride SA (KLOR-CON) 20 MEQ tablet Take 1 tablet (20 mEq total) by mouth 2 (two) times daily. 06/17/21   Hali Marry, MD  Semaglutide,0.25 or 0.5MG/DOS, (OZEMPIC, 0.25 OR 0.5 MG/DOSE,) 2 MG/1.5ML SOPN Inject 0.5 mg into the skin once a week. 11/10/21 02/08/22  Hali Marry, MD    Family History Family History  Problem Relation Age of Onset   Colon cancer Father 52       colon/ polyps   Heart attack Father    Stroke Father    Hyperlipidemia Mother        Living   Hypertension Mother    Colon cancer Paternal Grandmother    Stroke Paternal Grandmother    Heart attack Paternal Grandmother        heart attack   Diabetes Other        grandmother   Supraventricular tachycardia Sister    Esophageal cancer Neg Hx    Stomach cancer Neg Hx    Rectal cancer Neg Hx    Breast cancer Neg Hx     Social History Social History   Tobacco Use   Smoking status: Never   Smokeless tobacco: Never  Vaping Use   Vaping Use: Never used  Substance Use Topics   Alcohol use: Yes    Alcohol/week: 0.0 standard drinks    Comment: occasional beer on the weekends.     Drug use: No     Allergies   Niacin and related and Gabapentin   Review of  Systems Review of Systems See HPI  Physical Exam Triage Vital Signs ED Triage Vitals  Enc Vitals Group     BP 12/08/21 1310 135/77     Pulse Rate 12/08/21 1310 74     Resp 12/08/21 1310 14     Temp 12/08/21 1310 97.7 F (36.5 C)     Temp Source 12/08/21 1310 Oral     SpO2 12/08/21 1310 96 %  Weight 12/08/21 1307 213 lb (96.6 kg)     Height 12/08/21 1307 _0  (1.651 m)     Head Circumference --      Peak Flow --      Pain Score 12/08/21 1307 0     Pain Loc --      Pain Edu? --      Excl. in [place]? --    No data found.  Updated Vital Signs BP 135/77 (BP Location: Right Arm)    Pulse 74    Temp 97.7 F (36.5 C) (Oral)    Resp 14    Ht _1  (1.651 m)    Wt 96.6 kg    SpO2 96%    BMI 35.45 kg/m      Physical Exam Constitutional:      General: She is not in acute distress.    Appearance: She is well-developed. She is ill-appearing.  HENT:     Head: Normocephalic and atraumatic.     Right Ear: Tympanic membrane and ear canal normal.     Left Ear: Tympanic membrane and ear canal normal.     Nose: Congestion present.     Mouth/Throat:     Pharynx: No posterior oropharyngeal erythema.  Eyes:     Conjunctiva/sclera: Conjunctivae normal.     Pupils: Pupils are equal, round, and reactive to light.  Cardiovascular:     Rate and Rhythm: Normal rate and regular rhythm.     Heart sounds: Normal heart sounds.  Pulmonary:     Effort: Pulmonary effort is normal. No respiratory distress.     Breath sounds: Wheezing and rhonchi present.     Comments: Wheezes and rhonchi present throughout both lungs.  After an albuterol treatment they are improved but not absent.  Chest x-ray was done for abnormal lung sounds that it is notable Abdominal:     General: There is no distension.     Palpations: Abdomen is soft.  Musculoskeletal:        General: Normal range of motion.     Cervical back: Normal range of motion.  Lymphadenopathy:     Cervical: No cervical adenopathy.  Skin:     General: Skin is warm and dry.  Neurological:     Mental Status: She is alert.     UC Treatments / Results  Labs (all labs ordered are listed, but only abnormal results are displayed) Labs Reviewed - No data to display  EKG   Radiology DG Chest 2 View  Result Date: 12/08/2021 CLINICAL DATA:  Cough and wheezing for the past week.  No fever. EXAM: CHEST - 2 VIEW COMPARISON:  None. FINDINGS: Normal sized heart. Clear lungs. Mild diffuse peribronchial thickening. Thoracic spine degenerative changes. K-wire crossing the right AC joint with mild degenerative or old posttraumatic fragmentation of the superior aspect of the acromion. IMPRESSION: Mild bronchitic changes. Electronically Signed   By: Claudie Revering M.D.   On: 12/08/2021 14:20    Procedures Procedures (including critical care time)  Medications Ordered in UC Medications  albuterol (VENTOLIN HFA) 108 (90 Base) MCG/ACT inhaler 2 puff (has no administration in time range)    Initial Impression / Assessment and Plan / UC Course  I have reviewed the triage vital signs and the nursing notes.  Pertinent labs & imaging results that were available during my care of the patient were reviewed by me and considered in my medical decision making (see chart for details).     Final Clinical Impressions(s) /  UC Diagnoses   Final diagnoses:  Acute bronchitis, unspecified organism     Discharge Instructions      Continue to drink lots of water Runs a humidifier in your bedroom Take over-the-counter Mucinex DM a 2 times a day for cough and to loosen the mucus Take the antibiotic as prescribed.  Azithromycin is taken 2 pills on day 1, then 1 a day until gone Take prednisone 2 times a day for 5 days Use albuterol inhaler as needed until wheezing has resolved Follow-up with your primary care doctor if not improving by the end of the week   ED Prescriptions     Medication Sig Dispense Auth. Provider   azithromycin (ZITHROMAX) 250  MG tablet Take 1 tablet (250 mg total) by mouth daily. Take first 2 tablets together, then 1 every day until finished. 6 tablet Raylene Everts, MD   predniSONE (DELTASONE) 20 MG tablet Take 1 tablet (20 mg total) by mouth 2 (two) times daily with a meal. 10 tablet Raylene Everts, MD      PDMP not reviewed this encounter.   Raylene Everts, MD 12/08/21 1745

## 2021-12-08 NOTE — Discharge Instructions (Signed)
Continue to drink lots of water Runs a humidifier in your bedroom Take over-the-counter Mucinex DM a 2 times a day for cough and to loosen the mucus Take the antibiotic as prescribed.  Azithromycin is taken 2 pills on day 1, then 1 a day until gone Take prednisone 2 times a day for 5 days Use albuterol inhaler as needed until wheezing has resolved Follow-up with your primary care doctor if not improving by the end of the week

## 2021-12-08 NOTE — ED Triage Notes (Addendum)
Pt presents with cough and congestion for about 1 week, pt st she has been coughing up mucous. Pt has been taking otc carciden. Trouble sleeping due to cough

## 2021-12-23 ENCOUNTER — Telehealth: Payer: Self-pay

## 2021-12-23 ENCOUNTER — Other Ambulatory Visit: Payer: Self-pay

## 2021-12-23 DIAGNOSIS — K219 Gastro-esophageal reflux disease without esophagitis: Secondary | ICD-10-CM

## 2021-12-23 DIAGNOSIS — M48062 Spinal stenosis, lumbar region with neurogenic claudication: Secondary | ICD-10-CM

## 2021-12-23 DIAGNOSIS — E876 Hypokalemia: Secondary | ICD-10-CM

## 2021-12-23 MED ORDER — PANTOPRAZOLE SODIUM 40 MG PO TBEC: 40.0000 mg | DELAYED_RELEASE_TABLET | Freq: Every day | ORAL | 1 refills | Status: DC

## 2021-12-23 MED ORDER — POTASSIUM CHLORIDE CRYS ER 20 MEQ PO TBCR: 20.0000 meq | EXTENDED_RELEASE_TABLET | Freq: Two times a day (BID) | ORAL | 1 refills | Status: DC

## 2021-12-23 NOTE — Telephone Encounter (Signed)
Patient called in. She saw the lumbar spine mri results and would like an order placed for epidural .   Thanks

## 2021-12-23 NOTE — Addendum Note (Signed)
Addended by: Silverio Decamp on: 12/23/2021 11:46 AM   Modules accepted: Orders

## 2021-12-23 NOTE — Telephone Encounter (Signed)
Epidural ordered.  

## 2021-12-24 ENCOUNTER — Ambulatory Visit
Admission: RE | Admit: 2021-12-24 | Discharge: 2021-12-24 | Disposition: A | Discharge status: Home / Self Care | Payer: Medicare Other | Source: Ambulatory Visit | Attending: Sports Medicine | Admitting: Sports Medicine

## 2021-12-24 ENCOUNTER — Encounter: Payer: Self-pay | Admitting: Family Medicine

## 2021-12-24 ENCOUNTER — Other Ambulatory Visit: Payer: Self-pay

## 2021-12-24 DIAGNOSIS — M48062 Spinal stenosis, lumbar region with neurogenic claudication: Secondary | ICD-10-CM

## 2021-12-24 MED ORDER — METHYLPREDNISOLONE ACETATE 40 MG/ML INJ SUSP (RADIOLOG
80.0000 mg | Freq: Once | INTRAMUSCULAR | Status: AC
  Administered 2021-12-24: 80 mg via EPIDURAL

## 2021-12-24 MED ORDER — IOPAMIDOL (ISOVUE-M 200) INJECTION 41%
1.0000 mL | Freq: Once | INTRAMUSCULAR | Status: AC
  Administered 2021-12-24: 1 mL via EPIDURAL

## 2021-12-24 NOTE — Discharge Instructions (Signed)

## 2022-01-12 ENCOUNTER — Other Ambulatory Visit: Payer: Self-pay | Admitting: *Deleted

## 2022-01-12 ENCOUNTER — Ambulatory Visit: Payer: 59 | Admitting: Family Medicine

## 2022-01-12 MED ORDER — ATORVASTATIN CALCIUM 40 MG PO TABS: ORAL_TABLET | ORAL | 3 refills | Status: DC

## 2022-01-12 MED ORDER — LOSARTAN POTASSIUM 100 MG PO TABS: 100.0000 mg | ORAL_TABLET | Freq: Every day | ORAL | 1 refills | Status: DC

## 2022-01-30 ENCOUNTER — Other Ambulatory Visit (HOSPITAL_COMMUNITY): Payer: Self-pay

## 2022-02-17 LAB — HM DIABETES EYE EXAM

## 2022-02-24 ENCOUNTER — Encounter: Payer: Self-pay | Admitting: Family Medicine

## 2022-02-24 ENCOUNTER — Ambulatory Visit (INDEPENDENT_AMBULATORY_CARE_PROVIDER_SITE_OTHER): Payer: Medicare Other | Admitting: Family Medicine

## 2022-02-24 VITALS — BP 106/76 | HR 87 | Resp 18 | Ht 65.0 in | Wt 213.0 lb

## 2022-02-24 DIAGNOSIS — G8929 Other chronic pain: Secondary | ICD-10-CM | POA: Diagnosis not present

## 2022-02-24 DIAGNOSIS — F5089 Other specified eating disorder: Secondary | ICD-10-CM

## 2022-02-24 DIAGNOSIS — E038 Other specified hypothyroidism: Secondary | ICD-10-CM | POA: Diagnosis not present

## 2022-02-24 DIAGNOSIS — M1A09X0 Idiopathic chronic gout, multiple sites, without tophus (tophi): Secondary | ICD-10-CM | POA: Diagnosis not present

## 2022-02-24 DIAGNOSIS — M25561 Pain in right knee: Secondary | ICD-10-CM | POA: Diagnosis not present

## 2022-02-24 DIAGNOSIS — R509 Fever, unspecified: Secondary | ICD-10-CM

## 2022-02-24 DIAGNOSIS — E1169 Type 2 diabetes mellitus with other specified complication: Secondary | ICD-10-CM | POA: Diagnosis not present

## 2022-02-24 DIAGNOSIS — E669 Obesity, unspecified: Secondary | ICD-10-CM

## 2022-02-24 DIAGNOSIS — E785 Hyperlipidemia, unspecified: Secondary | ICD-10-CM

## 2022-02-24 DIAGNOSIS — I10 Essential (primary) hypertension: Secondary | ICD-10-CM | POA: Diagnosis not present

## 2022-02-24 DIAGNOSIS — G609 Hereditary and idiopathic neuropathy, unspecified: Secondary | ICD-10-CM | POA: Diagnosis not present

## 2022-02-24 LAB — POCT GLYCOSYLATED HEMOGLOBIN (HGB A1C): Hemoglobin A1C: 6.4 % — AB (ref 4.0–5.6)

## 2022-02-24 MED ORDER — MELOXICAM 15 MG PO TABS: ORAL_TABLET | Freq: Every day | ORAL | 3 refills | Status: DC

## 2022-02-24 MED ORDER — ALLOPURINOL 300 MG PO TABS: ORAL_TABLET | Freq: Every day | ORAL | 3 refills | Status: DC

## 2022-02-24 MED ORDER — DULOXETINE HCL 60 MG PO CPEP: ORAL_CAPSULE | Freq: Every day | ORAL | 3 refills | Status: DC

## 2022-02-24 MED ORDER — LEVOTHYROXINE SODIUM 137 MCG PO TABS: ORAL_TABLET | Freq: Every day | ORAL | 3 refills | Status: DC

## 2022-02-24 MED ORDER — CHLORTHALIDONE 50 MG PO TABS: ORAL_TABLET | Freq: Every day | ORAL | 3 refills | Status: DC

## 2022-02-24 NOTE — Assessment & Plan Note (Signed)
Due to recheck uric acid level. ?

## 2022-02-24 NOTE — Assessment & Plan Note (Signed)
Due to recheck TSH.  Currently on 137 mcg. ?

## 2022-02-24 NOTE — Assessment & Plan Note (Signed)
We will refill Cymbalta today. ?

## 2022-02-24 NOTE — Assessment & Plan Note (Signed)
A1c 6.5 today. ? ?Well controlled. Continue current regimen. Follow up in  4 months.  ?

## 2022-02-24 NOTE — Patient Instructions (Signed)
Please check your blood pressure at home about 2-3 times a week for the next couple of weeks and let me know if your blood pressures are running under 115. ? ?Alternatives to your Ozempic would include Trulicity, Victoza, and Mounjaro. ?

## 2022-02-24 NOTE — Assessment & Plan Note (Signed)
Pressure looks phenomenal in fact is a little bit on the low end.  I think retirement has been good for her though really do want her to work on increasing her activity level.  I do want her to monitor at home for the next couple of weeks and if it is staying low then we might be able to decrease her losartan.  Or her chlorthalidone. ?

## 2022-02-24 NOTE — Progress Notes (Signed)
? ?Established Patient Office Visit ? ?Subjective   ?Patient ID: Alexis Mcneil, female    DOB: [birth year]  Age: 66 y.o. MRN: [id number] ? ?Chief Complaint  ?Patient presents with  ? Diabetes  ?  Follow up   ? Diabetes Eye Exam  ?  Done 02/17/22 at [facility] in [place]. Release form faxed.   ? Discuss Medication  ?  Patient would like to discuss changing Ozempic if possible due to cost.   ? ? ?HPI ? ?Diabetes - no hypoglycemic events. No wounds or sores that are not healing well. No increased thirst or urination. Checking glucose at home. Taking medications as prescribed without any side effects.  The Ozempic is costing on average about $40 per month and wanted to discuss other options. ? ?She also reports that she tends to crave and crunch ice all the time and says a friend mentioned to her that that was abnormal and so she wanted to ask about it. ? ?She says the last couple months of been pretty tough.  She says in February she had a bad cold that ended up causing significant wheezing she ended up going to urgent care was placed on azithromycin and prednisone she started to feel better but then got COVID in March.  She also ended up having an injection in her knee with Dr. Darene Lamer.  And then ended up having an injection in her back.  She has been taking meloxicam and alternating with Tylenol. ? ?She also reports has been having some low-grade fevers in the evenings she says she will just start feeling bad all of a sudden and then she will check her temperature it is a little elevated she will feel kind of like she is having sweats and then will just go to bed and really sweat all night, and then will wake up feeling fine the next day.  She has not figured out any specific triggers.  It happened again earlier this week. ? ? ? ?ROS ? ?  ?Objective:  ?  ? ?BP 106/76   Pulse 87   Resp 18   Ht '5\' 5"'$  (1.651 m)   Wt 213 lb (96.6 kg)   SpO2 96%   BMI 35.45 kg/m?  ? ? ?Physical Exam ?Vitals and nursing note reviewed.   ?Constitutional:   ?   Appearance: She is well-developed.  ?HENT:  ?   Head: Normocephalic and atraumatic.  ?Cardiovascular:  ?   Rate and Rhythm: Normal rate and regular rhythm.  ?   Heart sounds: Normal heart sounds.  ?Pulmonary:  ?   Effort: Pulmonary effort is normal.  ?   Breath sounds: Normal breath sounds.  ?Skin: ?   General: Skin is warm and dry.  ?Neurological:  ?   Mental Status: She is alert and oriented to person, place, and time.  ?Psychiatric:     ?   Behavior: Behavior normal.  ? ? ? ?Results for orders placed or performed in visit on 02/24/22  ?POCT HgB A1C  ?Result Value Ref Range  ? Hemoglobin A1C 6.4 (A) 4.0 - 5.6 %  ? HbA1c POC (<> result, manual entry)    ? HbA1c, POC (prediabetic range)    ? HbA1c, POC (controlled diabetic range)    ? ? ? ? ?The 10-year ASCVD risk score (Arnett DK, et al., 2019) is: 9.7% ? ?  ?Assessment & Plan:  ? ?Problem List Items Addressed This Visit   ? ?  ? Cardiovascular and  Mediastinum  ? HYPERTENSION, BENIGN ESSENTIAL  ?  Pressure looks phenomenal in fact is a little bit on the low end.  I think retirement has been good for her though really do want her to work on increasing her activity level.  I do want her to monitor at home for the next couple of weeks and if it is staying low then we might be able to decrease her losartan.  Or her chlorthalidone. ? ?  ?  ? Relevant Medications  ? chlorthalidone (HYGROTON) 50 MG tablet  ? Other Relevant Orders  ? COMPLETE METABOLIC PANEL WITH GFR  ?  ? Endocrine  ? Hypothyroidism  ?  Due to recheck TSH.  Currently on 137 mcg. ? ?  ?  ? Relevant Medications  ? levothyroxine (SYNTHROID) 137 MCG tablet  ? Other Relevant Orders  ? TSH  ? Diabetes mellitus type 2 in obese [facility]) - Primary  ?  A1c 6.5 today. ? ?Well controlled. Continue current regimen. Follow up in  4 months.  ? ?  ?  ? Relevant Orders  ? POCT HgB A1C (Completed)  ? TSH  ? Lipid panel  ? COMPLETE METABOLIC PANEL WITH GFR  ?  ? Nervous and Auditory  ? Hereditary and  idiopathic peripheral neuropathy  ?  We will refill Cymbalta today. ? ?  ?  ? Relevant Medications  ? DULoxetine (CYMBALTA) 60 MG capsule  ?  ? Other  ? Right knee pain  ? Relevant Medications  ? meloxicam (MOBIC) 15 MG tablet  ? Hyperlipidemia  ? Relevant Medications  ? chlorthalidone (HYGROTON) 50 MG tablet  ? Other Relevant Orders  ? Lipid panel  ? Gout  ?  Due to recheck uric acid level. ? ?  ?  ? Relevant Orders  ? Uric acid  ? ?Other Visit Diagnoses   ? ? Pica      ? Relevant Orders  ? Fe+TIBC+Fer  ? Fever, unspecified fever cause      ? Relevant Orders  ? Sedimentation rate  ? C-reactive protein  ? ?  ?Pica-we will screen for iron deficiency.  She says she has tried to give blood in the past and was told that her hemoglobin was a little low and could not donate.  But no known prior history of iron deficiency. ? ?Richardson Dopp has been having some periodic episodic fevers in the evening I would like to check a sed rate and a CRP do not have a great explanation for why this is happening. ? ?Return in about 4 months (around 06/26/2022) for Diabetes follow-up, Hypertension.  ? ? ?Beatrice Lecher, MD ? ?

## 2022-02-25 ENCOUNTER — Encounter: Payer: Self-pay | Admitting: Family Medicine

## 2022-02-25 ENCOUNTER — Other Ambulatory Visit: Payer: Self-pay | Admitting: *Deleted

## 2022-02-25 LAB — IRON,TIBC AND FERRITIN PANEL
%SAT: 22 % (calc) (ref 16–45)
Ferritin: 51 ng/mL (ref 16–288)
Iron: 74 ug/dL (ref 45–160)
TIBC: 339 mcg/dL (calc) (ref 250–450)

## 2022-02-25 LAB — COMPLETE METABOLIC PANEL WITH GFR
AG Ratio: 1.7 (calc) (ref 1.0–2.5)
ALT: 17 U/L (ref 6–29)
AST: 20 U/L (ref 10–35)
Albumin: 4.3 g/dL (ref 3.6–5.1)
Alkaline phosphatase (APISO): 87 U/L (ref 37–153)
BUN: 19 mg/dL (ref 7–25)
CO2: 29 mmol/L (ref 20–32)
Calcium: 9.9 mg/dL (ref 8.6–10.4)
Chloride: 99 mmol/L (ref 98–110)
Creat: 1.04 mg/dL (ref 0.50–1.05)
Globulin: 2.5 g/dL (calc) (ref 1.9–3.7)
Glucose, Bld: 115 mg/dL (ref 65–139)
Potassium: 3.4 mmol/L — ABNORMAL LOW (ref 3.5–5.3)
Sodium: 139 mmol/L (ref 135–146)
Total Bilirubin: 0.9 mg/dL (ref 0.2–1.2)
Total Protein: 6.8 g/dL (ref 6.1–8.1)
eGFR: 60 mL/min/{1.73_m2} (ref >=60)

## 2022-02-25 LAB — SEDIMENTATION RATE: Sed Rate: 33 mm/h — ABNORMAL HIGH (ref 0–30)

## 2022-02-25 LAB — LIPID PANEL
Cholesterol: 182 mg/dL (ref <200)
HDL: 48 mg/dL — ABNORMAL LOW (ref >=50)
LDL Cholesterol (Calc): 104 mg/dL (calc) — ABNORMAL HIGH
Non-HDL Cholesterol (Calc): 134 mg/dL (calc) — ABNORMAL HIGH (ref <130)
Total CHOL/HDL Ratio: 3.8 (calc) (ref <5.0)
Triglycerides: 178 mg/dL — ABNORMAL HIGH (ref <150)

## 2022-02-25 LAB — C-REACTIVE PROTEIN: CRP: 17.5 mg/L — ABNORMAL HIGH (ref <8.0)

## 2022-02-25 LAB — TSH: TSH: 0.74 mIU/L (ref 0.40–4.50)

## 2022-02-25 LAB — URIC ACID: Uric Acid, Serum: 5.8 mg/dL (ref 2.5–7.0)

## 2022-02-25 NOTE — Progress Notes (Signed)
Alexis Mcneil, LDL cholesterol is just slightly elevated at 104.  Triglycerides also just mildly elevated by about 20+ points.  Continue to work on [employer] and increasing activity level.  Kidney function is stable.  Potassium looks better this time it was pretty low 3 months ago but it is much closer to normal and looks better.  Continue to work on eating potassium rich foods.  Liver function looks great.  We did check to inflammatory markers 1 is called a sed rate which is often associated with autoimmune diseases it was just borderline elevated at 33 which is actually still considered in the normal range.  So it does not typically indicate any kind of autoimmune issue.  The second 1 is called a CRP and is actually linked more so to increased risk for heart disease 7 years ago your level was 1.8 and it is now up to 17.  Again just really encouraged her to continue to work on increasing vegetable intake in the diet and trying to be more active on a consistent basis.  Thyroid level looks great.  Uric acid is less than 6 which is great.  Iron levels look good. ? ?The 10-year ASCVD risk score (Arnett DK, et al., 2019) is: 9.8% ?  Values used to calculate the score: ?    Age: 66 years ?    Sex: Female ?    Is Non-Hispanic African American: No ?    Diabetic: Yes ?    Tobacco smoker: No ?    Systolic Blood Pressure: 856 mmHg ?    Is BP treated: Yes ?    HDL Cholesterol: 48 mg/dL ?    Total Cholesterol: 182 mg/dL ?

## 2022-02-26 ENCOUNTER — Other Ambulatory Visit: Payer: Self-pay

## 2022-02-26 MED ORDER — OZEMPIC (0.25 OR 0.5 MG/DOSE) 2 MG/1.5ML ~~LOC~~ SOPN: 0.2500 mg | PEN_INJECTOR | SUBCUTANEOUS | 1 refills | Status: DC

## 2022-03-01 ENCOUNTER — Encounter: Payer: Self-pay | Admitting: Family Medicine

## 2022-03-01 DIAGNOSIS — E1169 Type 2 diabetes mellitus with other specified complication: Secondary | ICD-10-CM

## 2022-03-02 MED ORDER — SEMAGLUTIDE (1 MG/DOSE) 4 MG/3ML ~~LOC~~ SOPN: 1.0000 mg | PEN_INJECTOR | SUBCUTANEOUS | 1 refills | Status: DC

## 2022-03-02 NOTE — Telephone Encounter (Signed)
New prescription sent for 1 mg. ?

## 2022-03-16 ENCOUNTER — Encounter: Payer: Self-pay | Admitting: Family Medicine

## 2022-03-16 DIAGNOSIS — E1169 Type 2 diabetes mellitus with other specified complication: Secondary | ICD-10-CM

## 2022-03-16 MED ORDER — METFORMIN HCL 1000 MG PO TABS: 1000.0000 mg | ORAL_TABLET | Freq: Every day | ORAL | 1 refills | Status: DC

## 2022-03-16 MED ORDER — SEMAGLUTIDE (1 MG/DOSE) 4 MG/3ML ~~LOC~~ SOPN: 1.0000 mg | PEN_INJECTOR | SUBCUTANEOUS | 1 refills | Status: DC

## 2022-03-16 NOTE — Progress Notes (Signed)
Meds ordered this encounter  ?Medications  ? Semaglutide, 1 MG/DOSE, 4 MG/3ML SOPN  ?  Sig: Inject 1 mg as directed once a week.  ?  Dispense:  9 mL  ?  Refill:  1  ? metFORMIN (GLUCOPHAGE) 1000 MG tablet  ?  Sig: Take 1 tablet (1,000 mg total) by mouth daily with breakfast.  ?  Dispense:  90 tablet  ?  Refill:  1  ? ? ?

## 2022-03-20 ENCOUNTER — Encounter: Payer: Self-pay | Admitting: Family Medicine

## 2022-05-10 ENCOUNTER — Other Ambulatory Visit: Payer: Self-pay | Admitting: Family Medicine

## 2022-05-10 DIAGNOSIS — E876 Hypokalemia: Secondary | ICD-10-CM

## 2022-05-10 DIAGNOSIS — K219 Gastro-esophageal reflux disease without esophagitis: Secondary | ICD-10-CM

## 2022-06-22 ENCOUNTER — Encounter: Payer: Self-pay | Admitting: Family Medicine

## 2022-06-22 ENCOUNTER — Ambulatory Visit (INDEPENDENT_AMBULATORY_CARE_PROVIDER_SITE_OTHER): Payer: Medicare Other | Admitting: Family Medicine

## 2022-06-22 VITALS — BP 121/57 | HR 84 | Ht 65.0 in | Wt 204.0 lb

## 2022-06-22 DIAGNOSIS — Z23 Encounter for immunization: Secondary | ICD-10-CM | POA: Diagnosis not present

## 2022-06-22 DIAGNOSIS — E669 Obesity, unspecified: Secondary | ICD-10-CM

## 2022-06-22 DIAGNOSIS — N1831 Chronic kidney disease, stage 3a: Secondary | ICD-10-CM

## 2022-06-22 DIAGNOSIS — E1169 Type 2 diabetes mellitus with other specified complication: Secondary | ICD-10-CM | POA: Diagnosis not present

## 2022-06-22 DIAGNOSIS — I10 Essential (primary) hypertension: Secondary | ICD-10-CM

## 2022-06-22 LAB — POCT UA - MICROALBUMIN
Albumin/Creatinine Ratio, Urine, POC: <30
Creatinine, POC: 300 mg/dL
Microalbumin Ur, POC: 30 mg/L

## 2022-06-22 LAB — POCT GLYCOSYLATED HEMOGLOBIN (HGB A1C): Hemoglobin A1C: 6.1 % — AB (ref 4.0–5.6)

## 2022-06-22 NOTE — Assessment & Plan Note (Signed)
Well controlled. Continue current regimen. Follow up in  3-4 mo  

## 2022-06-22 NOTE — Assessment & Plan Note (Signed)
Follow renal function every 6 months. 

## 2022-06-22 NOTE — Progress Notes (Signed)
Established Patient Office Visit  Subjective   Patient ID: Alexis Mcneil, female    DOB: 07-29-1956  Age: 66 y.o. MRN: [id number]  Chief Complaint  Patient presents with   Follow-up    HPI  Diabetes - no hypoglycemic events. No wounds or sores that are not healing well. No increased thirst or urination. Checking glucose at home. Taking medications as prescribed without any side effects.  She is no longer on the metformin we had kept it on her medication list initially because we were sure we can get the Ozempic covered.  But she is just using the Ozempic.  Hypertension- Pt denies chest pain, SOB, dizziness, or heart palpitations.  Taking meds as directed w/o problems.  Denies medication side effects.    Is been staying busy and active.  She has been helping her sister clean out her mother's house.  She has been dealing with bilateral knee pain as well.  She had an injection in one of her knees back in January but says it did not really help much.    ROS    Objective:     BP (!) 121/57   Pulse 84   Ht '5\' 5"'$  (1.651 m)   Wt 204 lb (92.5 kg)   SpO2 98%   BMI 33.95 kg/m    Physical Exam Vitals and nursing note reviewed.  Constitutional:      Appearance: She is well-developed.  HENT:     Head: Normocephalic and atraumatic.  Cardiovascular:     Rate and Rhythm: Normal rate and regular rhythm.     Heart sounds: Normal heart sounds.  Pulmonary:     Effort: Pulmonary effort is normal.     Breath sounds: Normal breath sounds.  Skin:    General: Skin is warm and dry.  Neurological:     Mental Status: She is alert and oriented to person, place, and time.  Psychiatric:        Behavior: Behavior normal.      Results for orders placed or performed in visit on 06/22/22  POCT HgB A1C  Result Value Ref Range   Hemoglobin A1C 6.1 (A) 4.0 - 5.6 %   HbA1c POC (<> result, manual entry)     HbA1c, POC (prediabetic range)     HbA1c, POC (controlled diabetic range)    POCT UA  - Microalbumin  Result Value Ref Range   Microalbumin Ur, POC 30 mg/L   Creatinine, POC 300 mg/dL   Albumin/Creatinine Ratio, Urine, POC <30       The 10-year ASCVD risk score (Arnett DK, et al., 2019) is: 12.6%    Assessment & Plan:   Problem List Items Addressed This Visit       Cardiovascular and Mediastinum   HYPERTENSION, BENIGN ESSENTIAL    Well controlled. Continue current regimen. Follow up in  3-4 mo         Endocrine   Diabetes mellitus type 2 in obese (HCC) - Primary    A1c looks phenomenal today.  She is also down 9 pounds which is fantastic!  Medication has been quite expensive so we discussed checking into patient assistance since she has now had her Medicare gap to see if they might be able to help cover the medication.  Otherwise follow-up in 4 months.  Lab Results  Component Value Date   HGBA1C 6.1 (A) 06/22/2022         Relevant Orders   POCT HgB A1C (Completed)   POCT  UA - Microalbumin (Completed)     Genitourinary   CKD (chronic kidney disease) stage 3, GFR 30-59 ml/min (HCC)    Follow renal function every 6 months.      Other Visit Diagnoses     Need for pneumococcal 20-valent conjugate vaccination       Relevant Orders   Pneumococcal conjugate vaccine 20-valent (Prevnar 20) (Completed)       Return in about 4 months (around 10/22/2022) for Diabetes follow-up and labs .    Beatrice Lecher, MD

## 2022-06-22 NOTE — Assessment & Plan Note (Addendum)
A1c looks phenomenal today.  She is also down 9 pounds which is fantastic!  Medication has been quite expensive so we discussed checking into patient assistance since she has now had her Medicare gap to see if they might be able to help cover the medication.  Otherwise follow-up in 4 months.  Lab Results  Component Value Date   HGBA1C 6.1 (A) 06/22/2022

## 2022-06-30 ENCOUNTER — Other Ambulatory Visit: Payer: Self-pay

## 2022-06-30 DIAGNOSIS — E669 Obesity, unspecified: Secondary | ICD-10-CM

## 2022-06-30 MED ORDER — GLUCOSE BLOOD VI STRP: ORAL_STRIP | 99 refills | Status: DC

## 2022-07-03 ENCOUNTER — Other Ambulatory Visit: Payer: Self-pay

## 2022-07-03 DIAGNOSIS — E1169 Type 2 diabetes mellitus with other specified complication: Secondary | ICD-10-CM

## 2022-07-03 MED ORDER — GLUCOSE BLOOD VI STRP: ORAL_STRIP | 99 refills | Status: DC

## 2022-07-14 ENCOUNTER — Other Ambulatory Visit: Payer: Self-pay | Admitting: Family Medicine

## 2022-07-14 DIAGNOSIS — Z1231 Encounter for screening mammogram for malignant neoplasm of breast: Secondary | ICD-10-CM

## 2022-07-15 ENCOUNTER — Other Ambulatory Visit: Payer: Self-pay | Admitting: Family Medicine

## 2022-07-15 DIAGNOSIS — E1169 Type 2 diabetes mellitus with other specified complication: Secondary | ICD-10-CM

## 2022-07-22 ENCOUNTER — Ambulatory Visit (INDEPENDENT_AMBULATORY_CARE_PROVIDER_SITE_OTHER): Payer: Medicare Other | Admitting: Family Medicine

## 2022-07-22 VITALS — BP 115/69 | HR 82 | Ht 65.5 in | Wt 205.1 lb

## 2022-07-22 DIAGNOSIS — Z0000 Encounter for general adult medical examination without abnormal findings: Secondary | ICD-10-CM | POA: Diagnosis not present

## 2022-07-22 NOTE — Patient Instructions (Addendum)
[place] Maintenance Summary and Written Plan of Care  Alexis Mcneil ,  Thank you for allowing me to perform your Medicare Annual Wellness Visit and for your ongoing commitment to your health.   Health Maintenance & Immunization History Health Maintenance  Topic Date Due   COVID-19 Vaccine (4 - Pfizer series) 08/07/2022 (Originally 12/12/2020)   INFLUENZA VACCINE  02/07/2023 (Originally 06/09/2022)   FOOT EXAM  10/14/2022   HEMOGLOBIN A1C  12/23/2022   OPHTHALMOLOGY EXAM  02/18/2023   Diabetic kidney evaluation - GFR measurement  02/25/2023   Diabetic kidney evaluation - Urine ACR  06/23/2023   MAMMOGRAM  08/27/2023   COLONOSCOPY (Pts 45-74yr Insurance coverage will need to be confirmed)  04/26/2024   TETANUS/TDAP  05/26/2025   Pneumonia Vaccine 66 Years old  Completed   DEXA SCAN  Completed   Hepatitis C Screening  Completed   Zoster Vaccines- Shingrix  Completed   HPV VACCINES  Aged Out   Immunization History  Administered Date(s) Administered   Influenza Whole 09/09/2006   Influenza,inj,Quad PF,6+ Mos 08/16/2013, 08/10/2015, 07/22/2016   Influenza-Unspecified 07/23/2017, 07/13/2018, 08/07/2019, 08/05/2020, 08/12/2021   PFIZER(Purple Top)SARS-COV-2 Vaccination 11/07/2019, 11/28/2019, 10/17/2020   PNEUMOCOCCAL CONJUGATE-20 06/22/2022   Pneumococcal Polysaccharide-23 09/23/2015   Td 11/09/2004   Tdap 05/27/2015   Zoster Recombinat (Shingrix) 01/18/2020, 06/12/2021    These are the patient goals that we discussed:  Goals Addressed               This Visit's Progress     Patient Stated (pt-stated)        07/22/2022 AWV Goal: Diabetes Management  Patient will maintain an A1C level below 7.0 Patient will not develop any diabetic foot complications Patient will not experience any hypoglycemic episodes over the next 3 months Patient will notify our office of any CBG readings outside of the provider recommended range by calling  [phone] will adhere to provider recommendations for diabetes management  Patient Self Management Activities take all medications as prescribed and report any negative side effects monitor and record blood sugar readings as directed adhere to a low carbohydrate diet that incorporates lean proteins, vegetables, whole grains, low glycemic fruits check feet daily noting any sores, cracks, injuries, or callous formations see PCP or podiatrist if she notices any changes in her legs, feet, or toenails Patient will visit PCP and have an A1C level checked every 3 to 6 months as directed  have a yearly eye exam to monitor for vascular changes associated with diabetes and will request that the report be sent to her pcp.  consult with her PCP regarding any changes in her health or new or worsening symptoms          This is a list of Health Maintenance Items that are overdue or due now: Screening mammography - scheduled for October, 2023. Influenza vaccine- patient would like to wait.  Bone density - due 2024.  Orders/Referrals Placed Today: No orders of the defined types were placed in this encounter.  (Contact our referral department at [phone] you have not spoken with someone about your referral appointment within the next 5 days)    Follow-up Plan Follow-up with MHali Marry MD as planned Medicare wellness visit in one year. AVS printed and given to the patient.      Health Maintenance, Female Adopting a healthy lifestyle and getting preventive care are important in promoting health and wellness. Ask your health care provider about: The right schedule for  you to have regular tests and exams. Things you can do on your own to prevent diseases and keep yourself healthy. What should I know about diet, weight, and exercise? Eat a healthy diet  Eat a diet that includes plenty of vegetables, fruits, low-fat dairy products, and lean protein. Do not eat a lot  of foods that are high in solid fats, added sugars, or sodium. Maintain a healthy weight Body mass index (BMI) is used to identify weight problems. It estimates body fat based on height and weight. Your health care provider can help determine your BMI and help you achieve or maintain a healthy weight. Get regular exercise Get regular exercise. This is one of the most important things you can do for your health. Most adults should: Exercise for at least 150 minutes each week. The exercise should increase your heart rate and make you sweat (moderate-intensity exercise). Do strengthening exercises at least twice a week. This is in addition to the moderate-intensity exercise. Spend less time sitting. Even light physical activity can be beneficial. Watch cholesterol and blood lipids Have your blood tested for lipids and cholesterol at 66 years of age, then have this test every 5 years. Have your cholesterol levels checked more often if: Your lipid or cholesterol levels are high. You are older than 66 years of age. You are at high risk for heart disease. What should I know about cancer screening? Depending on your health history and family history, you may need to have cancer screening at various ages. This may include screening for: Breast cancer. Cervical cancer. Colorectal cancer. Skin cancer. Lung cancer. What should I know about heart disease, diabetes, and high blood pressure? Blood pressure and heart disease High blood pressure causes heart disease and increases the risk of stroke. This is more likely to develop in people who have high blood pressure readings or are overweight. Have your blood pressure checked: Every 3-5 years if you are 59-53 years of age. Every year if you are 3 years old or older. Diabetes Have regular diabetes screenings. This checks your fasting blood sugar level. Have the screening done: Once every three years after age 27 if you are at a normal weight and have a  low risk for diabetes. More often and at a younger age if you are overweight or have a high risk for diabetes. What should I know about preventing infection? Hepatitis B If you have a higher risk for hepatitis B, you should be screened for this virus. Talk with your health care provider to find out if you are at risk for hepatitis B infection. Hepatitis C Testing is recommended for: Everyone born from [birth year] through 1965. Anyone with known risk factors for hepatitis C. Sexually transmitted infections (STIs) Get screened for STIs, including gonorrhea and chlamydia, if: You are sexually active and are younger than 66 years of age. You are older than 66 years of age and your health care provider tells you that you are at risk for this type of infection. Your sexual activity has changed since you were last screened, and you are at increased risk for chlamydia or gonorrhea. Ask your health care provider if you are at risk. Ask your health care provider about whether you are at high risk for HIV. Your health care provider may recommend a prescription medicine to help prevent HIV infection. If you choose to take medicine to prevent HIV, you should first get tested for HIV. You should then be tested every 3 months for as  long as you are taking the medicine. Pregnancy If you are about to stop having your period (premenopausal) and you may become pregnant, seek counseling before you get pregnant. Take 400 to 800 micrograms (mcg) of folic acid every day if you become pregnant. Ask for birth control (contraception) if you want to prevent pregnancy. Osteoporosis and menopause Osteoporosis is a disease in which the bones lose minerals and strength with aging. This can result in bone fractures. If you are 69 years old or older, or if you are at risk for osteoporosis and fractures, ask your health care provider if you should: Be screened for bone loss. Take a calcium or vitamin D supplement to lower your risk of  fractures. Be given hormone replacement therapy (HRT) to treat symptoms of menopause. Follow these instructions at home: Alcohol use Do not drink alcohol if: Your health care provider tells you not to drink. You are pregnant, may be pregnant, or are planning to become pregnant. If you drink alcohol: Limit how much you have to: 0-1 drink a day. Know how much alcohol is in your drink. In the [place] one drink equals one 12 oz bottle of beer (355 mL), one 5 oz glass of wine (148 mL), or one 1 oz glass of hard liquor (44 mL). Lifestyle Do not use any products that contain nicotine or tobacco. These products include cigarettes, chewing tobacco, and vaping devices, such as e-cigarettes. If you need help quitting, ask your health care provider. Do not use street drugs. Do not share needles. Ask your health care provider for help if you need support or information about quitting drugs. General instructions Schedule regular health, dental, and eye exams. Stay current with your vaccines. Tell your health care provider if: You often feel depressed. You have ever been abused or do not feel safe at home. Summary Adopting a healthy lifestyle and getting preventive care are important in promoting health and wellness. Follow your health care provider's instructions about healthy diet, exercising, and getting tested or screened for diseases. Follow your health care provider's instructions on monitoring your cholesterol and blood pressure. This information is not intended to replace advice given to you by your health care provider. Make sure you discuss any questions you have with your health care provider. Document Revised: 03/17/2021 Document Reviewed: 03/17/2021 Elsevier Patient Education  [employer].

## 2022-07-22 NOTE — Progress Notes (Signed)
MEDICARE ANNUAL WELLNESS VISIT  07/22/2022  Subjective:  GRACYNN Mcneil is a 66 y.o. female patient of Metheney, Rene Kocher, MD who had a Medicare Annual Wellness Visit today. [name] is Retired and lives alone. she does not have any children. she reports that she is socially active and does interact with friends/family regularly. she is minimally physically active and enjoys gardening.  Patient Care Team: Hali Marry, MD as PCP - General     07/22/2022    9:11 AM 06/07/2020    1:56 PM 03/07/2018    3:48 PM 02/19/2014    4:04 PM 11/08/2012    8:44 AM  Advanced Directives  Does Patient Have a Medical Advance Directive? Yes Yes Yes Patient has advance directive, copy not in chart Patient has advance directive, copy not in chart  Type of Advance Directive Living will [place];Living will [place];Living will  Living will  Does patient want to make changes to medical advance directive? No - Patient declined No - Patient declined     Copy of [place] in Chart?  No - copy requested No - copy requested      Hospital Utilization Over the Past 12 Months: # of hospitalizations or ER visits: 0 # of surgeries: 0  Review of Systems    Patient reports that her overall health is unchanged when compared to last year.  Review of Systems: History obtained from chart review and the patient  All other systems negative.  Pain Assessment Pain : No/denies pain     Current Medications & Allergies (verified) Allergies as of 07/22/2022       Reactions   Niacin And Related Nausea And Vomiting   Passed out and threw up   Gabapentin Other (See Comments)   Vision changes        Medication List        Accurate as of July 22, 2022  9:30 AM. If you have any questions, ask your nurse or doctor.          Accu-Chek [employer] Kit Use to check blood sugar once daily. Dx code: E11.69   allopurinol 300 MG  tablet Commonly known as: ZYLOPRIM TAKE 1/2 TABLET BY MOUTH DAILY   AMBULATORY NON FORMULARY MEDICATION Medication Name: CPAP machine and supplies with humidifier.  Please set her CPAP to 9 cm water pressure. Then download after one week so can review her apneas on 9 cm water pressure.  [facility]. Dx. OSA G47.33   atorvastatin 40 MG tablet Commonly known as: LIPITOR TAKE 1 TABLET BY MOUTH EVERY EVENING AT 6PM   chlorthalidone 50 MG tablet Commonly known as: HYGROTON TAKE 2 TABLETS BY MOUTH DAILY.   DULoxetine 60 MG capsule Commonly known as: CYMBALTA TAKE 1 CAPSULE BY MOUTH DAILY   FreeStyle Lite Devi   glucose blood test strip Freestyle Lite test strips.  FOR CHECKING BLOOD SUGAR ONCE DAILY   ibuprofen 800 MG tablet Commonly known as: ADVIL TAKE 1 TABLET BY MOUTH 3 TIMES DAILY AS NEEDED   levothyroxine 137 MCG tablet Commonly known as: SYNTHROID TAKE 1 TABLET BY MOUTH ONCE DAILY BEFORE BREAKFAST   losartan 100 MG tablet Commonly known as: COZAAR TAKE 1 TABLET BY MOUTH ONCE  DAILY   meloxicam 15 MG tablet Commonly known as: MOBIC TAKE 1 TABLET BY MOUTH ONCE DAILY   metFORMIN 1000 MG tablet Commonly known as: GLUCOPHAGE Take 1 tablet (1,000 mg total) by mouth daily with breakfast.  Ozempic (1 MG/DOSE) 4 MG/3ML Sopn Generic drug: Semaglutide (1 MG/DOSE) INJECT SUBCUTANEOUSLY 1 MG EVERY WEEK   pantoprazole 40 MG tablet Commonly known as: PROTONIX TAKE 1 TABLET BY MOUTH DAILY   potassium chloride SA 20 MEQ tablet Commonly known as: KLOR-CON M TAKE 1 TABLET BY MOUTH TWICE  DAILY        History (reviewed): Past Medical History:  Diagnosis Date   Anemia    Arthritis    KNEE,SHOULDERS   Cataract January 2021   Eye appt visit   Diabetes mellitus without complication ([place])    GERD (gastroesophageal reflux disease)    Heart murmur    Hyperlipidemia    Hypertension    IFG (impaired fasting glucose)    Neuromuscular disorder (HCC)    NEUROPATHY FEET    OSA (obstructive sleep apnea)    CPAP   Sleep apnea    CPAP   Thyroid disease    Past Surgical History:  Procedure Laterality Date   COLONOSCOPY     endometrial polyp     January 2014   EYE SURGERY  1988   Cyst on eye removed   mouth palate surgery repair  1961   large tear   ROOT CANEL     2 WEEKS AGO   rt knee surgery arthroscopy  2009   torn meniscus/medial   torn ligament shoulder  1975   repaired   Family History  Problem Relation Age of Onset   Colon cancer Father 78       colon/ polyps   Heart attack Father    Stroke Father    Cancer Father    Diabetes Father    Hyperlipidemia Mother        Living   Hypertension Mother    Arthritis Mother    Colon cancer Paternal Grandmother    Stroke Paternal Grandmother    Heart attack Paternal Grandmother        heart attack   Cancer Paternal Grandmother    Diabetes Paternal Grandmother    Diabetes Other        grandmother   Supraventricular tachycardia Sister    Hyperlipidemia Maternal Grandmother    Varicose Veins Maternal Grandmother    Cancer Paternal Aunt    Cancer Paternal Uncle    Esophageal cancer Neg Hx    Stomach cancer Neg Hx    Rectal cancer Neg Hx    Breast cancer Neg Hx    Social History   Socioeconomic History   Marital status: Single    Spouse name: Not on file   Number of children: Not on file   Years of education: 13   Highest education level: Associate degree: academic program  Occupational History   Occupation: Retired.  Tobacco Use   Smoking status: Never   Smokeless tobacco: Never  Vaping Use   Vaping Use: Never used  Substance and Sexual Activity   Alcohol use: Yes    Alcohol/week: 2.0 standard drinks of alcohol    Types: 2 Cans of beer per week    Comment: occasional beer on the weekends.     Drug use: No   Sexual activity: Not Currently    Birth control/protection: Post-menopausal, None  Other Topics Concern   Not on file  Social History Narrative   Lives alone. Has no  children. Education: associates degree. Enjoys doing yard work.   Social Determinants of Health   Financial Resource Strain: Low Risk  (07/22/2022)   Overall Financial Resource Strain (CARDIA)  Difficulty of Paying Living Expenses: Not hard at all  Food Insecurity: No Food Insecurity (07/22/2022)   Hunger Vital Sign    Worried About Running Out of Food in the Last Year: Never true    Ran Out of Food in the Last Year: Never true  Transportation Needs: No Transportation Needs (07/22/2022)   PRAPARE - Hydrologist (Medical): No    Lack of Transportation (Non-Medical): No  Physical Activity: Inactive (07/22/2022)   Exercise Vital Sign    Days of Exercise per Week: 0 days    Minutes of Exercise per Session: 0 min  Stress: No Stress Concern Present (07/22/2022)   [place]    Feeling of Stress : Not at all  Social Connections: Socially Isolated (07/22/2022)   Social Connection and Isolation Panel [NHANES]    Frequency of Communication with Friends and Family: Once a week    Frequency of Social Gatherings with Friends and Family: Once a week    Attends Religious Services: Never    Marine scientist or Organizations: No    Attends Archivist Meetings: Never    Marital Status: Never married    Activities of Daily Living    07/22/2022    9:16 AM 07/16/2022    3:02 PM  In your present state of health, do you have any difficulty performing the following activities:  Hearing?  0  Vision?  0  Difficulty concentrating or making decisions?  0  Walking or climbing stairs?  0  Dressing or bathing?  0  Doing errands, shopping?  0  Preparing Food and eating ?  N  Using the Toilet?  N  In the past six months, have you accidently leaked urine? Y Y  Comment stress incontinence.   Do you have problems with loss of bowel control?  N  Managing your Medications?  N  Managing your Finances?  N   Housekeeping or managing your Housekeeping?  N    Patient Education/Literacy How often do you need to have someone help you when you read instructions, pamphlets, or other written materials from your doctor or pharmacy?: 1 - Never What is the last grade level you completed in school?: Associates degree  Exercise Current Exercise Habits: The patient does not participate in regular exercise at present, Exercise limited by: orthopedic condition(s) (knee pain.)  Diet Patient reports consuming 2 meals a day and 1 snack(s) a day Patient reports that her primary diet is: Regular Patient reports that she does have regular access to food.   Depression Screen    07/22/2022    9:12 AM 06/12/2021    4:00 PM 03/11/2021    4:20 PM 05/20/2020    3:49 PM 03/27/2019    2:58 PM 12/20/2018    4:10 PM 03/07/2018    3:44 PM  PHQ 2/9 Scores  PHQ - 2 Score 0 0 0 0 0 0 2  PHQ- 9 Score       4     Fall Risk    07/22/2022    9:11 AM 07/16/2022    3:02 PM 06/12/2021    4:00 PM 03/11/2021    4:19 PM 09/05/2020    3:45 PM  [place] in the past year? 0 0 0 0 0  Number falls in past yr: 0 0  0   Injury with Fall? 0 0  0   Risk for fall due  to : No Fall Risks   No Fall Risks No Fall Risks  Follow up Falls evaluation completed  Falls evaluation completed Falls prevention discussed;Falls evaluation completed      Objective:   BP 115/69 (BP Location: Right Arm, Patient Position: Sitting, Cuff Size: Large)   Pulse 82   Ht 5' 5.5" (1.664 m)   Wt 205 lb 1.9 oz (93 kg)   SpO2 100%   BMI 33.61 kg/m   Last Weight  Most recent update: 07/22/2022  9:01 AM    Weight  93 kg (205 lb 1.9 oz)             Body mass index is 33.61 kg/m.  Hearing/Vision  Blonnie did not have difficulty with hearing/understanding during the face-to-face interview Carra did not have difficulty with her vision during the face-to-face interview Reports that she has had a formal eye exam by an eye care professional within the  past year Reports that she has not had a formal hearing evaluation within the past year  Cognitive Function:    07/22/2022    9:17 AM  6CIT Screen  What Year? 0 points  What month? 0 points  What time? 0 points  Count back from 20 0 points  Months in reverse 0 points  Repeat phrase 0 points  Total Score 0 points    Normal Cognitive Function Screening: Yes (Normal:0-7, Significant for Dysfunction: >8)  Immunization & Health Maintenance Record Immunization History  Administered Date(s) Administered   Influenza Whole 09/09/2006   Influenza,inj,Quad PF,6+ Mos 08/16/2013, 08/10/2015, 07/22/2016   Influenza-Unspecified 07/23/2017, 07/13/2018, 08/07/2019, 08/05/2020, 08/12/2021   PFIZER(Purple Top)SARS-COV-2 Vaccination 11/07/2019, 11/28/2019, 10/17/2020   PNEUMOCOCCAL CONJUGATE-20 06/22/2022   Pneumococcal Polysaccharide-23 09/23/2015   Td 11/09/2004   Tdap 05/27/2015   Zoster Recombinat (Shingrix) 01/18/2020, 06/12/2021    Health Maintenance  Topic Date Due   COVID-19 Vaccine (4 - Pfizer series) 08/07/2022 (Originally 12/12/2020)   INFLUENZA VACCINE  02/07/2023 (Originally 06/09/2022)   FOOT EXAM  10/14/2022   HEMOGLOBIN A1C  12/23/2022   OPHTHALMOLOGY EXAM  02/18/2023   Diabetic kidney evaluation - GFR measurement  02/25/2023   Diabetic kidney evaluation - Urine ACR  06/23/2023   MAMMOGRAM  08/27/2023   COLONOSCOPY (Pts 45-75yr Insurance coverage will need to be confirmed)  04/26/2024   TETANUS/TDAP  05/26/2025   Pneumonia Vaccine 66 Years old  Completed   DEXA SCAN  Completed   Hepatitis C Screening  Completed   Zoster Vaccines- Shingrix  Completed   HPV VACCINES  Aged Out       Assessment  This is a routine wellness examination for [employer]Har[employer]  Health Maintenance: Due or Overdue There are no preventive care reminders to display for this patient.   DRaul Deldoes not need a referral for CCommercial Metals CompanyAssistance: Care Management:   no Social  Work:    no Prescription Assistance:  no Nutrition/Diabetes Education:  no   Plan:  Personalized Goals  Goals Addressed               This Visit's Progress     Patient Stated (pt-stated)        07/22/2022 AWV Goal: Diabetes Management  Patient will maintain an A1C level below 7.0 Patient will not develop any diabetic foot complications Patient will not experience any hypoglycemic episodes over the next 3 months Patient will notify our office of any CBG readings outside of the provider recommended range by calling [phone] will adhere to provider  recommendations for diabetes management  Patient Self Management Activities take all medications as prescribed and report any negative side effects monitor and record blood sugar readings as directed adhere to a low carbohydrate diet that incorporates lean proteins, vegetables, whole grains, low glycemic fruits check feet daily noting any sores, cracks, injuries, or callous formations see PCP or podiatrist if she notices any changes in her legs, feet, or toenails Patient will visit PCP and have an A1C level checked every 3 to 6 months as directed  have a yearly eye exam to monitor for vascular changes associated with diabetes and will request that the report be sent to her pcp.  consult with her PCP regarding any changes in her health or new or worsening symptoms        Personalized Health Maintenance & Screening Recommendations  Screening mammography - scheduled for October, 2023. Influenza vaccine- patient would like to wait.  Bone density - due 2024.  Lung Cancer Screening Recommended: no (Low Dose CT Chest recommended if Age 55-80 years, 30 pack-year currently smoking OR have quit w/in past 15 years) Hepatitis C Screening recommended: no HIV Screening recommended: no  Advanced Directives: Written information was not given per the patient's request.  Referrals & Orders No orders of the defined types were placed  in this encounter.   Follow-up Plan Follow-up with Hali Marry, MD as planned Medicare wellness visit in one year. AVS printed and given to the patient.   I have personally reviewed and noted the following in the patient's chart:   Medical and social history Use of alcohol, tobacco or illicit drugs  Current medications and supplements Functional ability and status Nutritional status Physical activity Advanced directives List of other physicians Hospitalizations, surgeries, and ER visits in previous 12 months Vitals Screenings to include cognitive, depression, and falls Referrals and appointments  In addition, I have reviewed and discussed with patient certain preventive protocols, quality metrics, and best practice recommendations. A written personalized care plan for preventive services as well as general preventive health recommendations were provided to patient.     Tinnie Gens, RN BSN  07/22/2022

## 2022-07-27 ENCOUNTER — Encounter: Payer: Self-pay | Admitting: Family Medicine

## 2022-07-27 ENCOUNTER — Telehealth (INDEPENDENT_AMBULATORY_CARE_PROVIDER_SITE_OTHER): Payer: Medicare Other | Admitting: Family Medicine

## 2022-07-27 DIAGNOSIS — J40 Bronchitis, not specified as acute or chronic: Secondary | ICD-10-CM | POA: Diagnosis not present

## 2022-07-27 DIAGNOSIS — J329 Chronic sinusitis, unspecified: Secondary | ICD-10-CM | POA: Diagnosis not present

## 2022-07-27 MED ORDER — AMOXICILLIN 875 MG PO TABS: 875.0000 mg | ORAL_TABLET | Freq: Two times a day (BID) | ORAL | 0 refills | Status: AC

## 2022-07-27 NOTE — Progress Notes (Signed)
    Virtual Visit via Video Note  I connected with Alexis Mcneil on 07/27/22 at  1:00 PM EDT by a video enabled telemedicine application and verified that I am speaking with the correct person using two identifiers.   I discussed the limitations of evaluation and management by telemedicine and the availability of in person appointments. The patient expressed understanding and agreed to proceed.  Patient location: at home Provider location: in office  Subjective:    CC:   Chief Complaint  Patient presents with   Cough    Been having a cough for 2 weeks and still lingering and coughing up mucus and hade fever in the beginning but now just cough.    HPI: Cough x2 weeks that seems to be lingering.  Fever initially but that has resolved.  Still coughing up some mucus.Ribs are sore.  Noticed some tonsillar stones. Still has some chronic nasal drainage.  Then moved into her chest.  Sputum is a dark tan color. Taking mucinex and delsym.  + nasal congestion.  + facial pressure and HA. Negative for COVID   Past medical history, Surgical history, Family history not pertinant except as noted below, Social history, Allergies, and medications have been entered into the medical record, reviewed, and corrections made.    Objective:    General: Speaking clearly in complete sentences without any shortness of breath.  Alert and oriented x3.  Normal judgment. No apparent acute distress.    Impression and Recommendations:    Problem List Items Addressed This Visit   None Visit Diagnoses     Sinobronchitis    -  Primary   Relevant Medications   amoxicillin (AMOXIL) 875 MG tablet       Sinobronchitis-symptoms have been present for 2 weeks and she is really not feeling much better but still has a lot of nasal congestion drainage and persistent cough though the fever has resolved which is reassuring.  We will go ahead and treat with amoxicillin if not better by the end of the week then please  give [place] call back.  Okay to continue over-the-counter symptomatic care and make sure you are staying well-hydrated.  No orders of the defined types were placed in this encounter.   Meds ordered this encounter  Medications   amoxicillin (AMOXIL) 875 MG tablet    Sig: Take 1 tablet (875 mg total) by mouth 2 (two) times daily for 7 days.    Dispense:  14 tablet    Refill:  0     I discussed the assessment and treatment plan with the patient. The patient was provided an opportunity to ask questions and all were answered. The patient agreed with the plan and demonstrated an understanding of the instructions.   The patient was advised to call back or seek an in-person evaluation if the symptoms worsen or if the condition fails to improve as anticipated.   Beatrice Lecher, MD

## 2022-08-27 ENCOUNTER — Ambulatory Visit
Admission: RE | Admit: 2022-08-27 | Discharge: 2022-08-27 | Disposition: A | Discharge status: Home / Self Care | Payer: Medicare Other | Source: Ambulatory Visit | Attending: Family Medicine | Admitting: Family Medicine

## 2022-08-27 DIAGNOSIS — Z1231 Encounter for screening mammogram for malignant neoplasm of breast: Secondary | ICD-10-CM | POA: Diagnosis not present

## 2022-08-28 ENCOUNTER — Other Ambulatory Visit: Payer: Self-pay | Admitting: Family Medicine

## 2022-08-28 NOTE — Progress Notes (Signed)
Please call patient. Normal mammogram.  Repeat in 1 year.  

## 2022-09-14 ENCOUNTER — Other Ambulatory Visit: Payer: Self-pay | Admitting: Family Medicine

## 2022-10-22 ENCOUNTER — Ambulatory Visit (INDEPENDENT_AMBULATORY_CARE_PROVIDER_SITE_OTHER): Payer: Medicare Other | Admitting: Family Medicine

## 2022-10-22 ENCOUNTER — Encounter: Payer: Self-pay | Admitting: Family Medicine

## 2022-10-22 VITALS — BP 108/64 | HR 76 | Ht 65.5 in | Wt 202.0 lb

## 2022-10-22 DIAGNOSIS — E1169 Type 2 diabetes mellitus with other specified complication: Secondary | ICD-10-CM | POA: Diagnosis not present

## 2022-10-22 DIAGNOSIS — I10 Essential (primary) hypertension: Secondary | ICD-10-CM | POA: Diagnosis not present

## 2022-10-22 DIAGNOSIS — R209 Unspecified disturbances of skin sensation: Secondary | ICD-10-CM

## 2022-10-22 DIAGNOSIS — E669 Obesity, unspecified: Secondary | ICD-10-CM | POA: Diagnosis not present

## 2022-10-22 DIAGNOSIS — Z23 Encounter for immunization: Secondary | ICD-10-CM | POA: Diagnosis not present

## 2022-10-22 LAB — POCT GLYCOSYLATED HEMOGLOBIN (HGB A1C): Hemoglobin A1C: 6.8 % — AB (ref 4.0–5.6)

## 2022-10-22 MED ORDER — SEMAGLUTIDE (2 MG/DOSE) 8 MG/3ML ~~LOC~~ SOPN: 2.0000 mg | PEN_INJECTOR | SUBCUTANEOUS | 2 refills | Status: AC

## 2022-10-22 NOTE — Assessment & Plan Note (Signed)
A1c is up to 6.8.  She did come off the metformin.  We discussed going up to the Ozempic to 2 mg.  If were able to get her A1c back down under 6.5 that would be wonderful if not then we could consider adding back the metformin.  Lab Results  Component Value Date   HGBA1C 6.8 (A) 10/22/2022

## 2022-10-22 NOTE — Assessment & Plan Note (Signed)
Pressure looks phenomenal today.  Continue current regimen.

## 2022-10-22 NOTE — Progress Notes (Signed)
Established Patient Office Visit  Subjective   Patient ID: Alexis Mcneil, female    DOB: 02/01/1956  Age: 66 y.o. MRN: [id number]  Chief Complaint  Patient presents with   Diabetes    HPI  Hypertension- Pt denies chest pain, SOB, dizziness, or heart palpitations.  Taking meds as directed w/o problems.  Denies medication side effects.    Diabetes - no hypoglycemic events. No wounds or sores that are not healing well. No increased thirst or urination. Checking glucose at home. Taking medications as prescribed without any side effects.  Having a lot of difficulty with her feet.  She has bilateral neuropathy as well as knee pain.  She really wants to hold off on knee replacement.  She is working with the orthopedist they are talking about possibly doing some injections.  But right now she does feel like her pain is manageable.  Especially just feels like her feet are cold all the time.  Had ABIs done about 3 years ago.     ROS    Objective:     BP 108/64   Pulse 76   Ht 5' 5.5" (1.664 m)   Wt 202 lb (91.6 kg)   SpO2 97%   BMI 33.10 kg/m    Physical Exam Vitals and nursing note reviewed.  Constitutional:      Appearance: She is well-developed.  HENT:     Head: Normocephalic and atraumatic.  Cardiovascular:     Rate and Rhythm: Normal rate and regular rhythm.     Heart sounds: Normal heart sounds.  Pulmonary:     Effort: Pulmonary effort is normal.     Breath sounds: Normal breath sounds.  Skin:    General: Skin is warm and dry.  Neurological:     Mental Status: She is alert and oriented to person, place, and time.  Psychiatric:        Behavior: Behavior normal.     Results for orders placed or performed in visit on 00/86/76  BASIC METABOLIC PANEL WITH GFR  Result Value Ref Range   Glucose, Bld 110 65 - 139 mg/dL   BUN 22 7 - 25 mg/dL   Creat 1.21 (H) 0.50 - 1.05 mg/dL   eGFR 49 (L) > OR = 60 mL/min/1.79m   BUN/Creatinine Ratio 18 6 - 22 (calc)   Sodium  138 135 - 146 mmol/L   Potassium 3.2 (L) 3.5 - 5.3 mmol/L   Chloride 97 (L) 98 - 110 mmol/L   CO2 29 20 - 32 mmol/L   Calcium 10.0 8.6 - 10.4 mg/dL  POCT glycosylated hemoglobin (Hb A1C)  Result Value Ref Range   Hemoglobin A1C 6.8 (A) 4.0 - 5.6 %   HbA1c POC (<> result, manual entry)     HbA1c, POC (prediabetic range)     HbA1c, POC (controlled diabetic range)        The 10-year ASCVD risk score (Arnett DK, et al., 2019) is: 11.3%    Assessment & Plan:   Problem List Items Addressed This Visit       Cardiovascular and Mediastinum   HYPERTENSION, BENIGN ESSENTIAL    Pressure looks phenomenal today.  Continue current regimen.      Relevant Orders   POCT glycosylated hemoglobin (Hb A1C) (Completed)   BASIC METABOLIC PANEL WITH GFR (Completed)     Endocrine   Diabetes mellitus type 2 in obese (HCC) - Primary    A1c is up to 6.8.  She did come off the  metformin.  We discussed going up to the Ozempic to 2 mg.  If were able to get her A1c back down under 6.5 that would be wonderful if not then we could consider adding back the metformin.  Lab Results  Component Value Date   HGBA1C 6.8 (A) 10/22/2022        Relevant Medications   Semaglutide, 2 MG/DOSE, 8 MG/3ML SOPN   Other Relevant Orders   POCT glycosylated hemoglobin (Hb A1C) (Completed)   BASIC METABOLIC PANEL WITH GFR (Completed)   Other Visit Diagnoses     Need for immunization against influenza       Relevant Orders   Flu Vaccine QUAD High Dose(Fluad) (Completed)   Bilateral cold feet       Relevant Orders   VAS [place] ABI WITH/WO TBI      Evaluate for PVD-ABI ordered.  Return in about 4 months (around 02/21/2023) for Diabetes follow-up, Hypertension.    Beatrice Lecher, MD

## 2022-10-23 ENCOUNTER — Other Ambulatory Visit: Payer: Self-pay | Admitting: Family Medicine

## 2022-10-23 ENCOUNTER — Other Ambulatory Visit: Payer: Self-pay | Admitting: *Deleted

## 2022-10-23 DIAGNOSIS — I10 Essential (primary) hypertension: Secondary | ICD-10-CM

## 2022-10-23 DIAGNOSIS — E876 Hypokalemia: Secondary | ICD-10-CM

## 2022-10-23 LAB — BASIC METABOLIC PANEL WITH GFR
BUN/Creatinine Ratio: 18 (calc) (ref 6–22)
BUN: 22 mg/dL (ref 7–25)
CO2: 29 mmol/L (ref 20–32)
Calcium: 10 mg/dL (ref 8.6–10.4)
Chloride: 97 mmol/L — ABNORMAL LOW (ref 98–110)
Creat: 1.21 mg/dL — ABNORMAL HIGH (ref 0.50–1.05)
Glucose, Bld: 110 mg/dL (ref 65–139)
Potassium: 3.2 mmol/L — ABNORMAL LOW (ref 3.5–5.3)
Sodium: 138 mmol/L (ref 135–146)
eGFR: 49 mL/min/{1.73_m2} — ABNORMAL LOW (ref >=60)

## 2022-10-23 MED ORDER — CHLORTHALIDONE 50 MG PO TABS: 50.0000 mg | ORAL_TABLET | Freq: Every day | ORAL | 0 refills | Status: DC

## 2022-10-23 NOTE — Progress Notes (Signed)
HI Alexis Mcneil, we are going to have to come down on the chlorthalidone to 1 tab daily.  I would recommend cut down to 1.5 tabs daily for 10 days and then drop to 1 tab daily so you don't get rebound swelling. It is jut messing with your potassium too much!!!  We can recheck you potassium in 1 month.

## 2022-10-27 ENCOUNTER — Other Ambulatory Visit: Payer: Self-pay | Admitting: Family Medicine

## 2022-12-01 DIAGNOSIS — E876 Hypokalemia: Secondary | ICD-10-CM | POA: Diagnosis not present

## 2022-12-02 LAB — POTASSIUM: Potassium: 3.4 mmol/L — ABNORMAL LOW (ref 3.5–5.3)

## 2022-12-02 NOTE — Progress Notes (Signed)
Hi Alexis Mcneil, your potassium is a little better this time  but still borderline low.  How is your swelling doining on the 1 tab daily? How do you feel about cutting down to the '25mg'$  tab?

## 2022-12-02 NOTE — Progress Notes (Signed)
OK let start by having her cut back to 1 tab a day.. I thought she had already cut back so I was confused.

## 2022-12-02 NOTE — Progress Notes (Signed)
Yes, half of the '50mg'$  tab once a day would be perfect!!!

## 2022-12-10 ENCOUNTER — Other Ambulatory Visit: Payer: Self-pay | Admitting: Family Medicine

## 2022-12-10 DIAGNOSIS — G8929 Other chronic pain: Secondary | ICD-10-CM

## 2022-12-10 DIAGNOSIS — G609 Hereditary and idiopathic neuropathy, unspecified: Secondary | ICD-10-CM

## 2022-12-10 DIAGNOSIS — I10 Essential (primary) hypertension: Secondary | ICD-10-CM

## 2022-12-10 DIAGNOSIS — E038 Other specified hypothyroidism: Secondary | ICD-10-CM

## 2023-01-18 ENCOUNTER — Encounter: Payer: Self-pay | Admitting: Family Medicine

## 2023-01-18 NOTE — Telephone Encounter (Signed)
When I look I do not see 1 either.  Eyes would suspect we need to call imaging and see.

## 2023-01-26 ENCOUNTER — Encounter: Payer: Self-pay | Admitting: Family Medicine

## 2023-01-26 ENCOUNTER — Ambulatory Visit (HOSPITAL_COMMUNITY)
Admission: RE | Admit: 2023-01-26 | Discharge: 2023-01-26 | Disposition: A | Discharge status: Home / Self Care | Payer: Medicare Other | Source: Ambulatory Visit | Attending: Family Medicine | Admitting: Family Medicine

## 2023-01-26 DIAGNOSIS — R209 Unspecified disturbances of skin sensation: Secondary | ICD-10-CM | POA: Diagnosis not present

## 2023-01-26 DIAGNOSIS — R6889 Other general symptoms and signs: Secondary | ICD-10-CM

## 2023-01-26 LAB — VAS US ABI WITH/WO TBI
Left ABI: 1.25
Right ABI: 1.34

## 2023-01-26 NOTE — Progress Notes (Signed)
Hi Alexis Mcneil, the ABIs were within normal range on the left side.  They were not compressible on the right.  Send like to refer you to a vascular doctor for further workup.  Sometimes this can come from hardening of the blood vessels.  That is possible.  Sound like to work this up further if you are okay with this we can place the referral.

## 2023-02-22 ENCOUNTER — Telehealth: Payer: Self-pay | Admitting: Family Medicine

## 2023-02-22 ENCOUNTER — Ambulatory Visit (INDEPENDENT_AMBULATORY_CARE_PROVIDER_SITE_OTHER): Payer: Medicare Other | Admitting: Family Medicine

## 2023-02-22 ENCOUNTER — Encounter: Payer: Self-pay | Admitting: Family Medicine

## 2023-02-22 VITALS — BP 131/66 | HR 73 | Ht 65.5 in | Wt 201.0 lb

## 2023-02-22 DIAGNOSIS — I10 Essential (primary) hypertension: Secondary | ICD-10-CM

## 2023-02-22 DIAGNOSIS — E669 Obesity, unspecified: Secondary | ICD-10-CM | POA: Diagnosis not present

## 2023-02-22 DIAGNOSIS — E1169 Type 2 diabetes mellitus with other specified complication: Secondary | ICD-10-CM

## 2023-02-22 DIAGNOSIS — G4733 Obstructive sleep apnea (adult) (pediatric): Secondary | ICD-10-CM | POA: Diagnosis not present

## 2023-02-22 DIAGNOSIS — E119 Type 2 diabetes mellitus without complications: Secondary | ICD-10-CM

## 2023-02-22 LAB — POCT GLYCOSYLATED HEMOGLOBIN (HGB A1C): Hemoglobin A1C: 5.8 % — AB (ref 4.0–5.6)

## 2023-02-22 NOTE — Assessment & Plan Note (Signed)
Well controlled. Continue current regimen. Follow up in  6 mo  

## 2023-02-22 NOTE — Telephone Encounter (Signed)
Froms completed for Ozempic and placed in [place] B basket

## 2023-02-22 NOTE — Progress Notes (Signed)
Established Patient Office Visit  Subjective   Patient ID: Alexis Mcneil, female    DOB: [birth year]  Age: 67 y.o. MRN: [id number]  Chief Complaint  Patient presents with   Hypertension   Diabetes    HPI  Diabetes - no hypoglycemic events. No wounds or sores that are not healing well. No increased thirst or urination. Checking glucose at home. Taking medications as prescribed without any side effects.  Did bring in patient assistance forms were Ozempic since she is now on her Medicare gap.  She did fill out her portion. Has been more active.   Hypertension- Pt denies chest pain, SOB, dizziness, or heart palpitations.  Taking meds as directed w/o problems.  Denies medication side effects.    She also wanted to discuss her CPAP.  She feels like the pressure is just so much that it really dries her mouth out even though she is running a humidifier in the humidifier he does empty.  She notices that when she [place] it to the beach she does a little bit better and does a little worse here.  Though she has gas heat here and electrocute there.  This is [facility] for supplies.  Waking up around 2AM almost every night. Not feeling stressed.     ROS    Objective:     BP 131/66   Pulse 73   Ht 5' 5.5" (1.664 m)   Wt 201 lb (91.2 kg)   SpO2 96%   BMI 32.94 kg/m    Physical Exam Vitals and nursing note reviewed.  Constitutional:      Appearance: She is well-developed.  HENT:     Head: Normocephalic and atraumatic.  Cardiovascular:     Rate and Rhythm: Normal rate and regular rhythm.     Heart sounds: Normal heart sounds.  Pulmonary:     Effort: Pulmonary effort is normal.     Breath sounds: Normal breath sounds.  Skin:    General: Skin is warm and dry.  Neurological:     Mental Status: She is alert and oriented to person, place, and time.  Psychiatric:        Behavior: Behavior normal.      Results for orders placed or performed in visit on 02/22/23  POCT  glycosylated hemoglobin (Hb A1C)  Result Value Ref Range   Hemoglobin A1C 5.8 (A) 4.0 - 5.6 %   HbA1c POC (<> result, manual entry)     HbA1c, POC (prediabetic range)     HbA1c, POC (controlled diabetic range)        The 10-year ASCVD risk score (Arnett DK, et al., 2019) is: 16.2%    Assessment & Plan:   Problem List Items Addressed This Visit       Cardiovascular and Mediastinum   HYPERTENSION, BENIGN ESSENTIAL    Well controlled. Continue current regimen. Follow up in  20mo         Respiratory   Obstructive sleep apnea    To get download off her CPAP.  Will need to switch her back to AutoPap for a couple weeks and then get another download to see if maybe we can adjust her pressure.        Endocrine   Diabetes mellitus type 2 in obese - Primary    A1C looks great at 5.8!!!! Great work.  Will complete PA forms for Ozempic. Continue to stay active.  .       Relevant Orders   POCT  glycosylated hemoglobin (Hb A1C) (Completed)    Return in about 4 months (around 06/24/2023) for Diabetes follow-up, Hypertension.    Nani Gasser, MD

## 2023-02-22 NOTE — Assessment & Plan Note (Signed)
A1C looks great at 5.8!!!! Great work.  Will complete PA forms for Ozempic. Continue to stay active.  Marland Kitchen

## 2023-02-22 NOTE — Assessment & Plan Note (Signed)
To get download off her CPAP.  Will need to switch her back to AutoPap for a couple weeks and then get another download to see if maybe we can adjust her pressure.

## 2023-02-22 NOTE — Telephone Encounter (Signed)
Please call [facility] to see if they can change her to auotpap and then do a download in 2 weeks. She has lost weight and I want to see if we can reduce her pressure

## 2023-02-23 LAB — HM DIABETES EYE EXAM

## 2023-02-23 MED ORDER — AMBULATORY NON FORMULARY MEDICATION: 0 refills | Status: DC

## 2023-02-23 NOTE — Telephone Encounter (Signed)
I called [facility] and spoke with Jasmine December. She stated she needs an order to change to autopap and send a download in 2 weeks.   Faxed order to [phone]  [facility] Address: [address] [place], [place] [address] Phone: [phone]

## 2023-02-25 ENCOUNTER — Telehealth: Payer: Self-pay | Admitting: *Deleted

## 2023-02-25 NOTE — Telephone Encounter (Signed)
error 

## 2023-02-25 NOTE — Telephone Encounter (Signed)
Forms faxed and confirmation received placed in scan folder.

## 2023-03-07 ENCOUNTER — Encounter: Payer: Self-pay | Admitting: Family Medicine

## 2023-03-16 ENCOUNTER — Telehealth: Payer: Self-pay

## 2023-03-16 NOTE — Telephone Encounter (Signed)
Pt called and advised  

## 2023-03-16 NOTE — Telephone Encounter (Signed)
Forwarding to [place] as an FYI.  (Tonya B) Novo Nordisk PAP shipment for [employer] 8 mg (4 boxes) received this morning. Please contact the patient to come and pick up their order today. Placed in the fridge with patient identifier. Thanks in advance.   NDC: 1610-9604-54 LOT: [id number] EXP: 2025-09-08

## 2023-03-19 ENCOUNTER — Other Ambulatory Visit (INDEPENDENT_AMBULATORY_CARE_PROVIDER_SITE_OTHER): Payer: Medicare Other

## 2023-03-19 ENCOUNTER — Telehealth: Payer: Self-pay | Admitting: Sports Medicine

## 2023-03-19 ENCOUNTER — Ambulatory Visit (INDEPENDENT_AMBULATORY_CARE_PROVIDER_SITE_OTHER): Payer: Medicare Other | Admitting: Sports Medicine

## 2023-03-19 DIAGNOSIS — M7062 Trochanteric bursitis, left hip: Secondary | ICD-10-CM | POA: Diagnosis not present

## 2023-03-19 DIAGNOSIS — M1711 Unilateral primary osteoarthritis, right knee: Secondary | ICD-10-CM | POA: Diagnosis not present

## 2023-03-19 DIAGNOSIS — M7061 Trochanteric bursitis, right hip: Secondary | ICD-10-CM

## 2023-03-19 MED ORDER — TRAMADOL HCL 50 MG PO TABS
50.0000 mg | ORAL_TABLET | Freq: Three times a day (TID) | ORAL | 0 refills | Status: AC | PRN
Start: 2023-03-19 — End: 2023-03-24

## 2023-03-19 NOTE — Progress Notes (Signed)
    Procedures performed today:    Procedure: Real-time Ultrasound Guided injection of the right knee Device: Samsung HS60  Verbal informed consent obtained.  Time-out conducted.  Noted no overlying erythema, induration, or other signs of local infection.  Skin prepped in a sterile fashion.  Local anesthesia: Topical Ethyl chloride.  With sterile technique and under real time ultrasound guidance: Mild effusion noted, 1 cc Kenalog 40, 2 cc lidocaine, 2 cc bupivacaine injected easily Completed without difficulty  Advised to call if fevers/chills, erythema, induration, drainage, or persistent bleeding.  Images permanently stored and available for review in PACS.  Impression: Technically successful ultrasound guided injection.  Independent interpretation of notes and tests performed by another provider:   None.  Brief History, Exam, Impression, and Recommendations:    Primary osteoarthritis of right knee This is a very pleasant 67 year old female, known right knee osteoarthritis, last injection was January 2023. Now having increasing pain, mild swelling. Today we did a repeat knee injection, I am also going to work on getting her approved for viscosupplementation which we can start if she does not get dramatic relief from the steroid injection. For insurance purposes she has failed 6 weeks of physical therapy, she has x-ray confirmed arthritis.  Trochanteric bursitis of both hips Known trochanteric bursitis, last injected 2015 and did well, she is now having a recurrence of pain, she will do aggressive hip abductor conditioning, tramadol for breakthrough pain in spite of ibuprofen, and she can return to see me in 4 to 6 weeks for injections if not significantly better.    ____________________________________________ Ihor Austin. Benjamin Stain, M.D., ABFM., CAQSM., AME. Primary Care and Sports Medicine [facility]  Adjunct Professor of Family Medicine  [employer]  Restaurant manager, fast food

## 2023-03-19 NOTE — Assessment & Plan Note (Signed)
Known trochanteric bursitis, last injected 2015 and did well, she is now having a recurrence of pain, she will do aggressive hip abductor conditioning, tramadol for breakthrough pain in spite of ibuprofen, and she can return to see me in 4 to 6 weeks for injections if not significantly better.

## 2023-03-19 NOTE — Assessment & Plan Note (Signed)
This is a very pleasant 67 year old female, known right knee osteoarthritis, last injection was January 2023. Now having increasing pain, mild swelling. Today we did a repeat knee injection, I am also going to work on getting her approved for viscosupplementation which we can start if she does not get dramatic relief from the steroid injection. For insurance purposes she has failed 6 weeks of physical therapy, she has x-ray confirmed arthritis.

## 2023-03-19 NOTE — Telephone Encounter (Signed)
Orthovisc approval please bilateral x-ray confirmed osteoarthritis.

## 2023-03-31 NOTE — Telephone Encounter (Signed)
PA information submitted via [email or web address] for Orthovisc Paperwork has been printed and given to Dr. T for signatures. Once obtained, information will be faxed to [employer] at [phone]  

## 2023-04-02 NOTE — Telephone Encounter (Signed)
Benefits Investigation Details received from [employer] Injection: Orthovisc May fill through: Buy and Bill [facility] Copay/Coinsurance: 0% Product Copay: 0% Administration Coinsurance: 0% Administration Copay: 0% Deductible: $240 (met: $240)  Left detailed VM for patient to call and get gel injections scheduled.

## 2023-04-12 ENCOUNTER — Other Ambulatory Visit: Payer: Self-pay | Admitting: Family Medicine

## 2023-04-12 DIAGNOSIS — K219 Gastro-esophageal reflux disease without esophagitis: Secondary | ICD-10-CM

## 2023-04-23 ENCOUNTER — Encounter: Payer: Self-pay | Admitting: Family Medicine

## 2023-04-23 IMAGING — DX DG LUMBAR SPINE COMPLETE 4+V
5 series · 5 of 5 positions shown · non-contrast
Comparison: Lumbar spine radiographs 11/22/2012

CLINICAL DATA: Low back pain radiating to the left leg.

EXAM:
LUMBAR SPINE - COMPLETE 4+ VIEW

[l-spine ap]
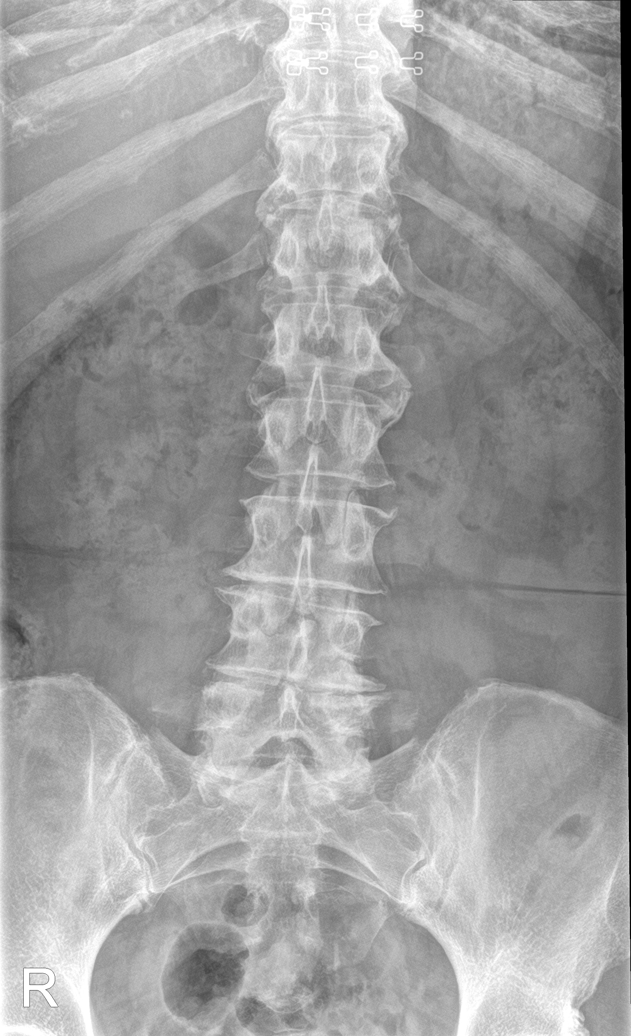

[l-spine obl (1 of 2)]
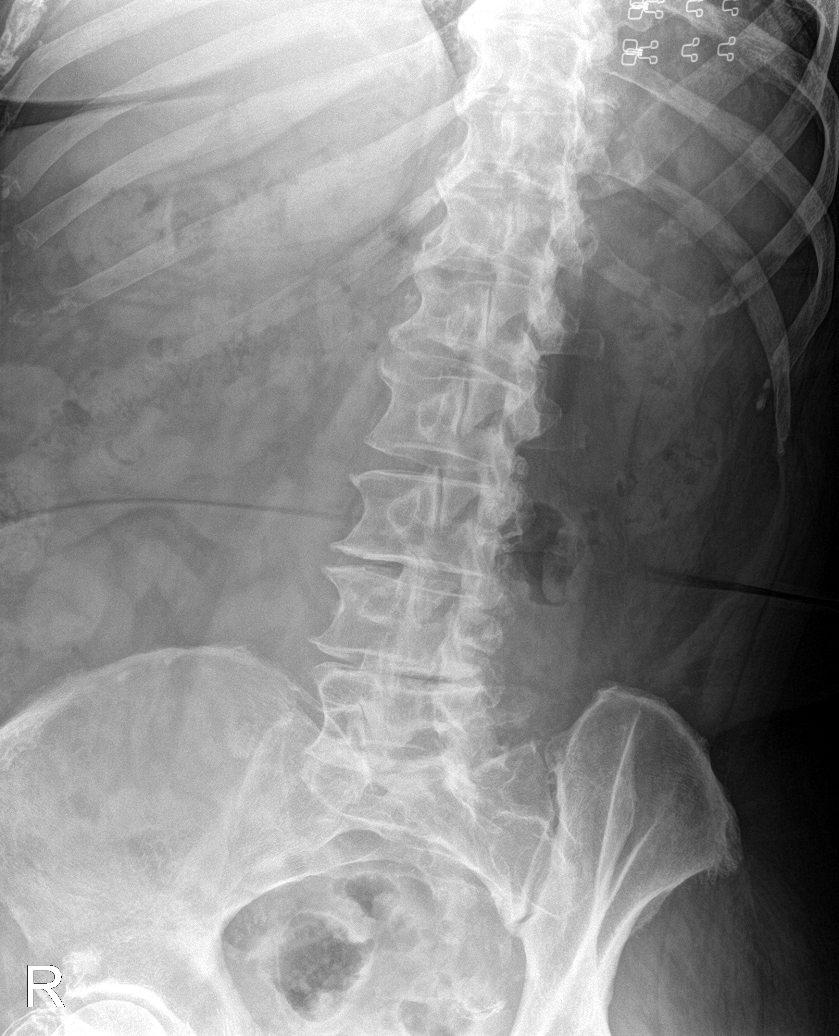

[l-spine obl (2 of 2)]
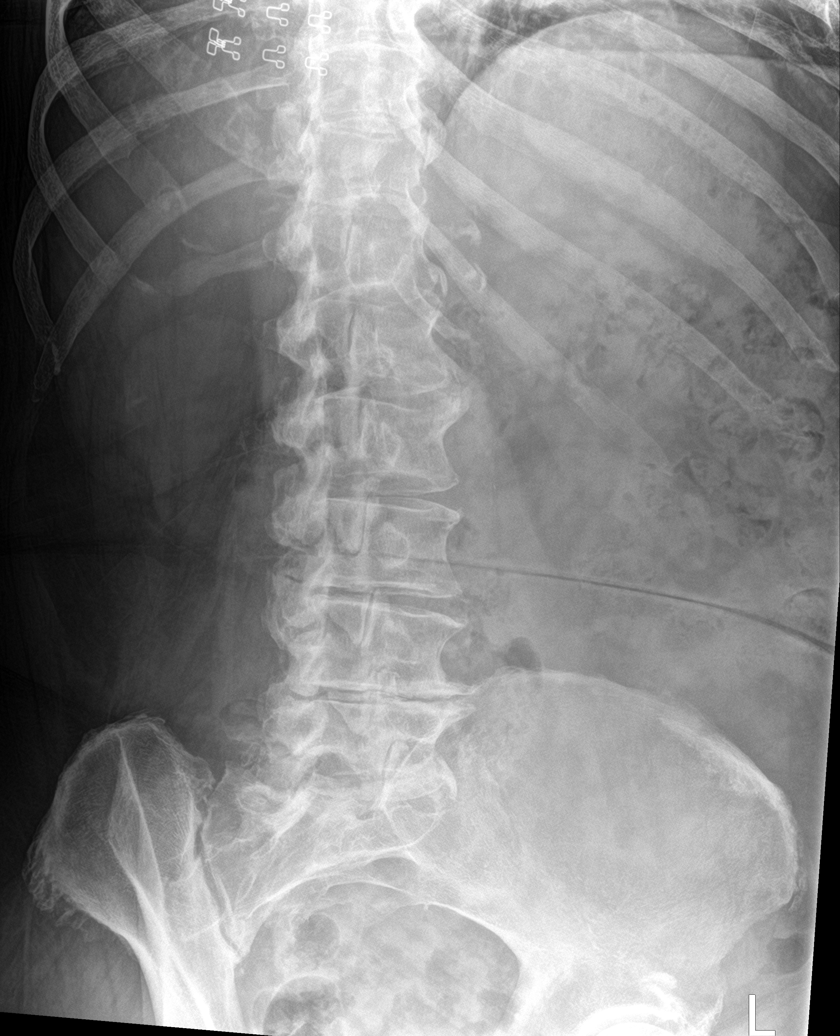

[l-spine lat]
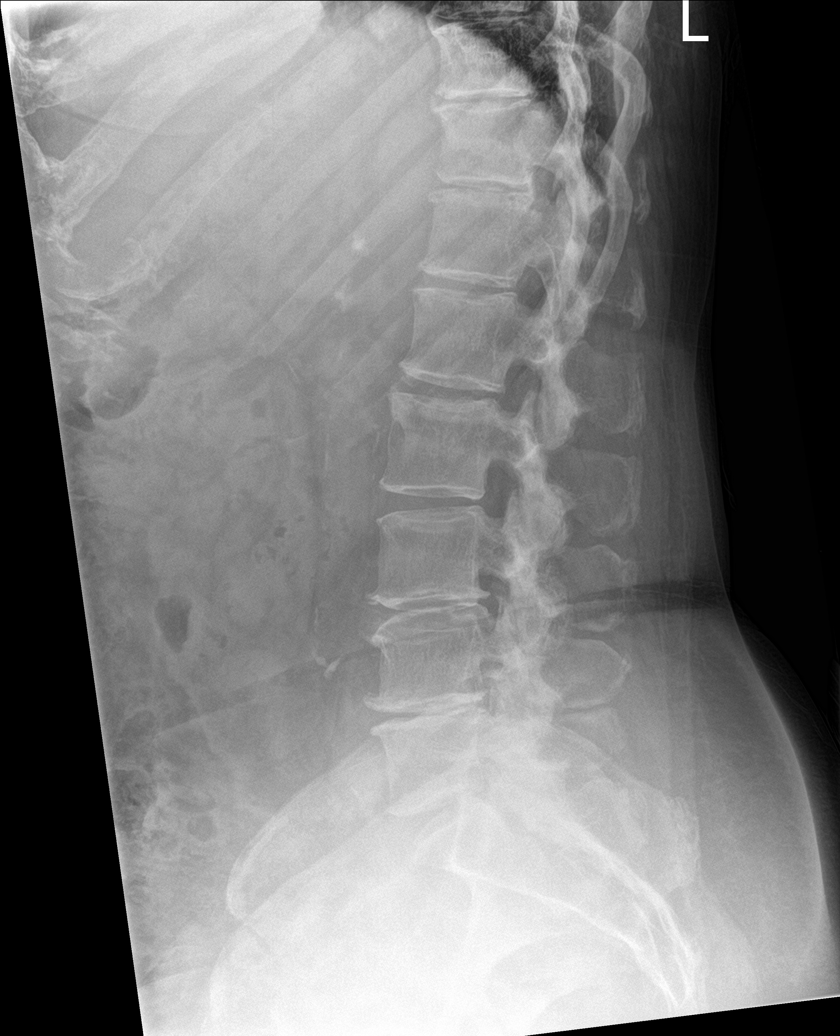

[l-spine spot]
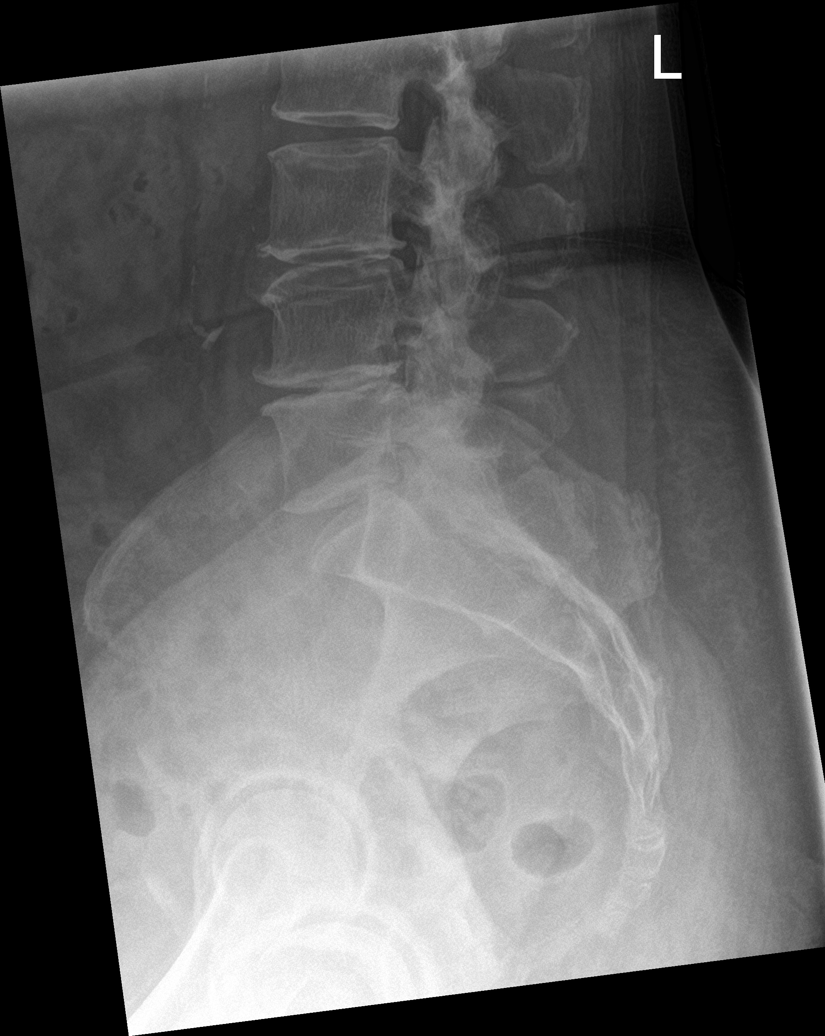

[5 of 5 positions shown; findings below may reference images not displayed]

FINDINGS: Normal alignment. Vertebral body heights are maintained. Multilevel
intervertebral disc space loss worse at L3-4 and L4-5. Multilevel
small osteophytes. Lumbar facet hypertrophy. No focal osseous
lesion. SI joints are open and symmetric. Nonobstructive bowel gas
pattern. Aortic atherosclerotic calcification.
IMPRESSION: Multilevel degenerative disc disease, worse at L3-4 and L4-5.

## 2023-04-30 ENCOUNTER — Ambulatory Visit: Payer: Medicare Other | Admitting: Sports Medicine

## 2023-06-07 ENCOUNTER — Other Ambulatory Visit: Payer: Self-pay | Admitting: Family Medicine

## 2023-06-07 DIAGNOSIS — E876 Hypokalemia: Secondary | ICD-10-CM

## 2023-06-09 ENCOUNTER — Ambulatory Visit: Payer: Self-pay | Admitting: Licensed Clinical Social Worker

## 2023-06-09 NOTE — Patient Instructions (Signed)
Visit Information  Thank you for taking time to visit with me today. Please don't hesitate to contact me if I can be of assistance to you.   Following are the goals we discussed today:   Goals Addressed             This Visit's Progress    Patient encouraged to talk with PCP about program support       Interventions:  Spoke with client about client needs Discussed program support with RN, LCSW, Pharmacist Discussed client support with PCP, Dr. Nani Gasser Encouraged client to talk with PCP about program support Client feels that she is doing pretty well at present and did not feel that she needed program support at present. She has heard of program and is aware it is available if she should need program support in the future. LCSW thanked client for phone call with LCSW today       No further follow up is needed at this time   Please call the care guide team at [phone] if you need to cancel or reschedule your appointment.   If you are experiencing a Mental Health or Behavioral Health Crisis or need someone to talk to, please go to [facility] [address], [place] [phone])   The patient verbalized understanding of instructions, educational materials, and care plan provided today and DECLINED offer to receive copy of patient instructions, educational materials, and care plan.   The patient has been provided with contact information for the care management team and has been advised to call with any health related questions or concerns.   Kelton Pillar.Yuki Brunsman MSW, LCSW Licensed Visual merchandiser [facility] Care Management [phone]

## 2023-06-09 NOTE — Patient Outreach (Signed)
  Care Coordination   Initial Visit Note   06/09/2023 Name: Alexis Mcneil MRN: [id number] DOB: 10-02-1956  Alexis Mcneil is a 67 y.o. year old female who sees Metheney, Barbarann Ehlers, MD for primary care. I spoke with  Clent Demark by phone today.  What matters to the patients health and wellness today?  Patient encouraged to talk with PCP about program support     Goals Addressed             This Visit's Progress    Patient encouraged to talk with PCP about program support       Interventions:  Spoke with client about client needs Discussed program support with RN, LCSW, Pharmacist Discussed client support with PCP, Dr. Nani Gasser Encouraged client to talk with PCP about program support Client feels that she is doing pretty well at present and did not feel that she needed program support at present. She has heard of program and is aware it is available if she should need program support in the future. LCSW thanked client for phone call with LCSW today        SDOH assessments and interventions completed:  Yes  SDOH Interventions Today    Flowsheet Row Most Recent Value  SDOH Interventions   Physical Activity Interventions Other (Comments)  [may have some mobility challenges]  Stress Interventions Other (Comment)  [may have some stress related to managing medical needs]        Care Coordination Interventions:  Yes, provided   Interventions Today    Flowsheet Row Most Recent Value  Chronic Disease   Chronic disease during today's visit Other  [spoke with client about client needs]  General Interventions   General Interventions Discussed/Reviewed General Interventions Discussed, Community Resources  [reviewed program support]  Education Interventions   Education Provided Provided Education  Provided Engineer, [employer]  Mental Health Interventions   Mental Health Discussed/Reviewed Coping Strategies  [no mood issues noted by client]         Follow up plan: No further follow up needed at this time   Encounter Outcome:  Pt. Visit Completed   Kelton Pillar.Stepan Verrette MSW, LCSW Licensed Visual merchandiser [facility] Care Management [phone]

## 2023-06-23 ENCOUNTER — Telehealth: Payer: Self-pay

## 2023-06-23 NOTE — Telephone Encounter (Signed)
Pt informed. She will p/u tomorrow

## 2023-06-23 NOTE — Telephone Encounter (Signed)
Forwarding to [place] as an Financial planner.  [employer] Nordisk PAP shipment for Ozempic 1 mg dose / 4 boxes received this morning. Please contact the patient to come and pick up their order today. Placed in the PAP fridge with patient identifier. Thanks in advance.   NDC: 8469-6295-28 LOT: [id number] EXP: 2025-10-08

## 2023-06-24 ENCOUNTER — Encounter: Payer: Self-pay | Admitting: Family Medicine

## 2023-06-24 ENCOUNTER — Ambulatory Visit (INDEPENDENT_AMBULATORY_CARE_PROVIDER_SITE_OTHER): Payer: Medicare Other | Admitting: Family Medicine

## 2023-06-24 VITALS — BP 126/51 | HR 69 | Ht 65.5 in | Wt 200.0 lb

## 2023-06-24 DIAGNOSIS — N1831 Chronic kidney disease, stage 3a: Secondary | ICD-10-CM

## 2023-06-24 DIAGNOSIS — G4733 Obstructive sleep apnea (adult) (pediatric): Secondary | ICD-10-CM | POA: Diagnosis not present

## 2023-06-24 DIAGNOSIS — I10 Essential (primary) hypertension: Secondary | ICD-10-CM

## 2023-06-24 DIAGNOSIS — Z23 Encounter for immunization: Secondary | ICD-10-CM

## 2023-06-24 DIAGNOSIS — E669 Obesity, unspecified: Secondary | ICD-10-CM | POA: Diagnosis not present

## 2023-06-24 DIAGNOSIS — Z7985 Long-term (current) use of injectable non-insulin antidiabetic drugs: Secondary | ICD-10-CM

## 2023-06-24 DIAGNOSIS — E1169 Type 2 diabetes mellitus with other specified complication: Secondary | ICD-10-CM | POA: Diagnosis not present

## 2023-06-24 LAB — POCT GLYCOSYLATED HEMOGLOBIN (HGB A1C): Hemoglobin A1C: 5.4 % (ref 4.0–5.6)

## 2023-06-24 LAB — POCT UA - MICROALBUMIN
Albumin/Creatinine Ratio, Urine, POC: <30
Creatinine, POC: 200 mg/dL
Microalbumin Ur, POC: 30 mg/L

## 2023-06-24 MED ORDER — CHLORTHALIDONE 50 MG PO TABS: 50.0000 mg | ORAL_TABLET | Freq: Every day | ORAL | Status: DC

## 2023-06-24 NOTE — Assessment & Plan Note (Signed)
Well controlled. Continue current regimen. Follow up in  4 mo 

## 2023-06-24 NOTE — Assessment & Plan Note (Signed)
Well controlled. Continue current regimen. Follow up in  89mo she is really doing great on the 2 mg Ozempic.  BMI is down to 32 as well.  Continue to work on healthy food choices and getting enough protein in and regular exercise.  Follow-up in 4 months.  No excess protein in the urine.  Lab Results  Component Value Date   HGBA1C 5.4 06/24/2023

## 2023-06-24 NOTE — Assessment & Plan Note (Signed)
No need to get updated CPAP download.

## 2023-06-24 NOTE — Assessment & Plan Note (Signed)
continue to follow renal function every 6 months.

## 2023-06-24 NOTE — Progress Notes (Signed)
Established Patient Office Visit  Subjective   Patient ID: Alexis Mcneil, female    DOB: [birth year]  Age: 67 y.o. MRN: [id number]  Chief Complaint  Patient presents with   Diabetes    HPI Diabetes - no hypoglycemic events. No wounds or sores that are not healing well. No increased thirst or urination. Checking glucose at home. Taking medications as prescribed without any side effects.  He on 2 mg Ozempic getting supplies through patient assistance.  Last eye exam was up-to-date in April.  Hypertension- Pt denies chest pain, SOB, dizziness, or heart palpitations.  Taking meds as directed w/o problems.  Denies medication side effects.      ROS    Objective:     BP (!) 126/51   Pulse 69   Ht 5' 5.5" (1.664 m)   Wt 200 lb (90.7 kg)   SpO2 100%   BMI 32.78 kg/m    Physical Exam Vitals and nursing note reviewed.  Constitutional:      Appearance: She is well-developed.  HENT:     Head: Normocephalic and atraumatic.  Cardiovascular:     Rate and Rhythm: Normal rate and regular rhythm.     Heart sounds: Normal heart sounds.  Pulmonary:     Effort: Pulmonary effort is normal.     Breath sounds: Normal breath sounds.  Skin:    General: Skin is warm and dry.  Neurological:     Mental Status: She is alert and oriented to person, place, and time.  Psychiatric:        Behavior: Behavior normal.      Results for orders placed or performed in visit on 06/24/23  POCT glycosylated hemoglobin (Hb A1C)  Result Value Ref Range   Hemoglobin A1C 5.4 4.0 - 5.6 %   HbA1c POC (<> result, manual entry)     HbA1c, POC (prediabetic range)     HbA1c, POC (controlled diabetic range)    POCT UA - Microalbumin  Result Value Ref Range   Microalbumin Ur, POC 30 mg/L   Creatinine, POC 200 mg/dL   Albumin/Creatinine Ratio, Urine, POC <30       The 10-year ASCVD risk score (Arnett DK, et al., 2019) is: 15.1%    Assessment & Plan:   Problem List Items Addressed This Visit        Cardiovascular and Mediastinum   HYPERTENSION, BENIGN ESSENTIAL - Primary    Well controlled. Continue current regimen. Follow up in  4 mo       Relevant Medications   chlorthalidone (HYGROTON) 50 MG tablet   Other Relevant Orders   CMP14+EGFR   Lipid panel     Respiratory   Obstructive sleep apnea    No need to get updated CPAP download.        Endocrine   Diabetes mellitus type 2 in obese    Well controlled. Continue current regimen. Follow up in  63mo she is really doing great on the 2 mg Ozempic.  BMI is down to 32 as well.  Continue to work on healthy food choices and getting enough protein in and regular exercise.  Follow-up in 4 months.  No excess protein in the urine.  Lab Results  Component Value Date   HGBA1C 5.4 06/24/2023         Relevant Orders   CMP14+EGFR   Lipid panel   POCT glycosylated hemoglobin (Hb A1C) (Completed)   POCT UA - Microalbumin (Completed)     Genitourinary  CKD (chronic kidney disease) stage 3, GFR 30-59 ml/min (HCC)    continue to follow renal function every 6 months.      Relevant Orders   CMP14+EGFR   Lipid panel   POCT glycosylated hemoglobin (Hb A1C) (Completed)   POCT UA - Microalbumin (Completed)    Return in about 5 months (around 11/08/2023) for Diabetes follow-up, Hypertension.    Nani Gasser, MD

## 2023-06-25 ENCOUNTER — Encounter: Payer: Self-pay | Admitting: Family Medicine

## 2023-06-25 LAB — LIPID PANEL
Chol/HDL Ratio: 3.2 ratio (ref 0.0–4.4)
Cholesterol, Total: 191 mg/dL (ref 100–199)
HDL: 59 mg/dL (ref >39)
LDL Chol Calc (NIH): 108 mg/dL — ABNORMAL HIGH (ref 0–99)
Triglycerides: 137 mg/dL (ref 0–149)
VLDL Cholesterol Cal: 24 mg/dL (ref 5–40)

## 2023-06-25 LAB — CMP14+EGFR
ALT: 23 IU/L (ref 0–32)
AST: 22 IU/L (ref 0–40)
Albumin: 4.7 g/dL (ref 3.9–4.9)
Alkaline Phosphatase: 113 IU/L (ref 44–121)
BUN/Creatinine Ratio: 13 (ref 12–28)
BUN: 14 mg/dL (ref 8–27)
Bilirubin Total: 0.7 mg/dL (ref 0.0–1.2)
CO2: 24 mmol/L (ref 20–29)
Calcium: 10.2 mg/dL (ref 8.7–10.3)
Chloride: 99 mmol/L (ref 96–106)
Creatinine, Ser: 1.05 mg/dL — ABNORMAL HIGH (ref 0.57–1.00)
Globulin, Total: 2.1 g/dL (ref 1.5–4.5)
Glucose: 97 mg/dL (ref 70–99)
Potassium: 4.2 mmol/L (ref 3.5–5.2)
Sodium: 138 mmol/L (ref 134–144)
Total Protein: 6.8 g/dL (ref 6.0–8.5)
eGFR: 59 mL/min/{1.73_m2} — ABNORMAL LOW (ref >59)

## 2023-06-25 NOTE — Progress Notes (Signed)
Hi Alexis Mcneil, kidney function is stable. LDL cholesterol is up a little but your triglycerides look better.  We could increase your lipitor to 1.5 tabs.  Would you be ok with that?

## 2023-07-08 ENCOUNTER — Ambulatory Visit (INDEPENDENT_AMBULATORY_CARE_PROVIDER_SITE_OTHER): Payer: Medicare Other

## 2023-07-08 ENCOUNTER — Encounter: Payer: Self-pay | Admitting: Sports Medicine

## 2023-07-08 ENCOUNTER — Ambulatory Visit (INDEPENDENT_AMBULATORY_CARE_PROVIDER_SITE_OTHER): Payer: Medicare Other | Admitting: Sports Medicine

## 2023-07-08 DIAGNOSIS — S46012A Strain of muscle(s) and tendon(s) of the rotator cuff of left shoulder, initial encounter: Secondary | ICD-10-CM

## 2023-07-08 DIAGNOSIS — M75102 Unspecified rotator cuff tear or rupture of left shoulder, not specified as traumatic: Secondary | ICD-10-CM | POA: Diagnosis not present

## 2023-07-08 DIAGNOSIS — M19012 Primary osteoarthritis, left shoulder: Secondary | ICD-10-CM | POA: Diagnosis not present

## 2023-07-08 DIAGNOSIS — M25512 Pain in left shoulder: Secondary | ICD-10-CM | POA: Diagnosis not present

## 2023-07-08 MED ORDER — IBUPROFEN 800 MG PO TABS: 800.0000 mg | ORAL_TABLET | Freq: Three times a day (TID) | ORAL | 2 refills | Status: DC | PRN

## 2023-07-08 NOTE — Progress Notes (Signed)
    Procedures performed today:    None.  Independent interpretation of notes and tests performed by another provider:   None.  Brief History, Exam, Impression, and Recommendations:    Left rotator cuff tear This is a very pleasant 67 year old female, 1 month ago she was assembling some shelves, she was drilling with her left hand and felt a pop with pain in her left shoulder. Since then she has had some improvement but continues to have significant discomfort particular with abduction On exam she has positive impingement signs, she also has significant weakness to abduction concerning for a large acute supraspinatus tear. Due to acuity of her injury and likelihood of supraspinatus tearing as well as her high demand shoulder I think we should get advanced imaging in anticipation of potentially having operative intervention. Refilling ibuprofen 800 3 times daily, x-rays, MRI, follow-up will depend on MRI results.    ____________________________________________ Ihor Austin. Benjamin Stain, M.D., ABFM., CAQSM., AME. Primary Care and Sports Medicine [facility]  Adjunct Professor of Family Medicine  [employer]  Restaurant manager, fast food

## 2023-07-08 NOTE — Assessment & Plan Note (Signed)
This is a very pleasant 66 year old female, 1 month ago she was assembling some shelves, she was drilling with her left hand and felt a pop with pain in her left shoulder. Since then she has had some improvement but continues to have significant discomfort particular with abduction On exam she has positive impingement signs, she also has significant weakness to abduction concerning for a large acute supraspinatus tear. Due to acuity of her injury and likelihood of supraspinatus tearing as well as her high demand shoulder I think we should get advanced imaging in anticipation of potentially having operative intervention. Refilling ibuprofen 800 3 times daily, x-rays, MRI, follow-up will depend on MRI results.

## 2023-07-09 ENCOUNTER — Encounter: Payer: Self-pay | Admitting: Sports Medicine

## 2023-07-09 NOTE — Telephone Encounter (Signed)
Spoke with radiology and they are going to move this up for reading.

## 2023-07-09 NOTE — Telephone Encounter (Signed)
Yes please

## 2023-07-11 ENCOUNTER — Ambulatory Visit (INDEPENDENT_AMBULATORY_CARE_PROVIDER_SITE_OTHER): Payer: Medicare Other

## 2023-07-11 DIAGNOSIS — S46812A Strain of other muscles, fascia and tendons at shoulder and upper arm level, left arm, initial encounter: Secondary | ICD-10-CM | POA: Diagnosis not present

## 2023-07-11 DIAGNOSIS — S46012A Strain of muscle(s) and tendon(s) of the rotator cuff of left shoulder, initial encounter: Secondary | ICD-10-CM

## 2023-07-11 DIAGNOSIS — M75122 Complete rotator cuff tear or rupture of left shoulder, not specified as traumatic: Secondary | ICD-10-CM | POA: Diagnosis not present

## 2023-07-11 DIAGNOSIS — G8929 Other chronic pain: Secondary | ICD-10-CM

## 2023-07-11 DIAGNOSIS — M7582 Other shoulder lesions, left shoulder: Secondary | ICD-10-CM | POA: Diagnosis not present

## 2023-07-11 DIAGNOSIS — M129 Arthropathy, unspecified: Secondary | ICD-10-CM | POA: Diagnosis not present

## 2023-07-19 ENCOUNTER — Other Ambulatory Visit: Payer: Self-pay | Admitting: Family Medicine

## 2023-07-19 DIAGNOSIS — Z1231 Encounter for screening mammogram for malignant neoplasm of breast: Secondary | ICD-10-CM

## 2023-07-19 NOTE — Addendum Note (Signed)
Addended by: Monica Becton on: 07/19/2023 01:20 PM   Modules accepted: Orders

## 2023-07-21 DIAGNOSIS — Z23 Encounter for immunization: Secondary | ICD-10-CM

## 2023-08-03 ENCOUNTER — Encounter: Payer: Self-pay | Admitting: Family Medicine

## 2023-08-03 DIAGNOSIS — M25512 Pain in left shoulder: Secondary | ICD-10-CM | POA: Diagnosis not present

## 2023-08-04 ENCOUNTER — Ambulatory Visit: Payer: Medicare Other | Admitting: Family Medicine

## 2023-08-04 VITALS — BP 126/66 | HR 70 | Ht 65.0 in | Wt 202.1 lb

## 2023-08-04 DIAGNOSIS — Z0000 Encounter for general adult medical examination without abnormal findings: Secondary | ICD-10-CM

## 2023-08-04 DIAGNOSIS — Z780 Asymptomatic menopausal state: Secondary | ICD-10-CM | POA: Diagnosis not present

## 2023-08-04 NOTE — Patient Instructions (Addendum)
MEDICARE ANNUAL WELLNESS VISIT Health Maintenance Summary and Written Plan of Care  Ms. Hathway ,  Thank you for allowing me to perform your Medicare Annual Wellness Visit and for your ongoing commitment to your health.   Health Maintenance & Immunization History Health Maintenance  Topic Date Due   COVID-19 Vaccine (4 - 2023-24 season) 08/20/2023 (Originally 07/11/2023)   DEXA SCAN  08/03/2024 (Originally 03/01/2018)   FOOT EXAM  10/23/2023   HEMOGLOBIN A1C  12/25/2023   OPHTHALMOLOGY EXAM  02/23/2024   Colonoscopy  04/26/2024   Diabetic kidney evaluation - eGFR measurement  06/23/2024   Diabetic kidney evaluation - Urine ACR  06/23/2024   Medicare Annual Wellness (AWV)  08/03/2024   MAMMOGRAM  08/27/2024   DTaP/Tdap/Td (3 - Td or Tdap) 05/26/2025   Pneumonia Vaccine 22+ Years old  Completed   INFLUENZA VACCINE  Completed   Hepatitis C Screening  Completed   Zoster Vaccines- Shingrix  Completed   HPV VACCINES  Aged Out   Immunization History  Administered Date(s) Administered   Fluad Quad(high Dose 65+) 10/22/2022   Fluad Trivalent(High Dose 65+) 07/21/2023   Influenza Whole 09/09/2006   Influenza,inj,Quad PF,6+ Mos 08/16/2013, 08/10/2015, 07/22/2016   Influenza-Unspecified 07/23/2017, 07/13/2018, 08/07/2019, 08/05/2020, 08/12/2021   PFIZER(Purple Top)SARS-COV-2 Vaccination 11/07/2019, 11/28/2019, 10/17/2020   PNEUMOCOCCAL CONJUGATE-20 06/22/2022   Pneumococcal Polysaccharide-23 09/23/2015   Td 11/09/2004   Tdap 05/27/2015   Zoster Recombinant(Shingrix) 01/18/2020, 06/12/2021    These are the patient goals that we discussed:  Goals Addressed               This Visit's Progress     Patient Stated (pt-stated)        08/04/2023 AWV Goal: Exercise for General Health  Patient will verbalize understanding of the benefits of increased physical activity: Exercising regularly is important. It will improve your overall fitness, flexibility, and endurance. Regular exercise  also will improve your overall health. It can help you control your weight, reduce stress, and improve your bone density. Over the next year, patient will increase physical activity as tolerated with a goal of at least 150 minutes of moderate physical activity per week.  You can tell that you are exercising at a moderate intensity if your heart starts beating faster and you start breathing faster but can still hold a conversation. Moderate-intensity exercise ideas include: Walking 1 mile (1.6 km) in about 15 minutes Biking Hiking Golfing Dancing Water aerobics Patient will verbalize understanding of everyday activities that increase physical activity by providing examples like the following: Yard work, such as: Insurance underwriter Gardening Washing windows or floors Patient will be able to explain general safety guidelines for exercising:  Before you start a new exercise program, talk with your health care provider. Do not exercise so much that you hurt yourself, feel dizzy, or get very short of breath. Wear comfortable clothes and wear shoes with good support. Drink plenty of water while you exercise to prevent dehydration or heat stroke. Work out until your breathing and your heartbeat get faster.          This is a list of Health Maintenance Items that are overdue or due now: Screening mammography Bone densitometry screening  Orders/Referrals Placed Today: Orders Placed This Encounter  Procedures   DEXAScan    Standing Status:   Future    Standing Expiration Date:   08/03/2024    Scheduling Instructions:  Please call patient to schedule    Order Specific Question:   Reason for exam:    Answer:   post menopausal    Order Specific Question:   Preferred imaging location?    Answer:   [facility]    (Contact our referral department at [phone] if you have not spoken with someone  about your referral appointment within the next 5 days)    Follow-up Plan Follow-up with Agapito Games, MD as planned Schedule bone density and mammogram.  Medicare wellness visit in one year.  AVS printed and given to the patient.      Health Maintenance, Female Adopting a healthy lifestyle and getting preventive care are important in promoting health and wellness. Ask your health care provider about: The right schedule for you to have regular tests and exams. Things you can do on your own to prevent diseases and keep yourself healthy. What should I know about diet, weight, and exercise? Eat a healthy diet  Eat a diet that includes plenty of vegetables, fruits, low-fat dairy products, and lean protein. Do not eat a lot of foods that are high in solid fats, added sugars, or sodium. Maintain a healthy weight Body mass index (BMI) is used to identify weight problems. It estimates body fat based on height and weight. Your health care provider can help determine your BMI and help you achieve or maintain a healthy weight. Get regular exercise Get regular exercise. This is one of the most important things you can do for your health. Most adults should: Exercise for at least 150 minutes each week. The exercise should increase your heart rate and make you sweat (moderate-intensity exercise). Do strengthening exercises at least twice a week. This is in addition to the moderate-intensity exercise. Spend less time sitting. Even light physical activity can be beneficial. Watch cholesterol and blood lipids Have your blood tested for lipids and cholesterol at 67 years of age, then have this test every 5 years. Have your cholesterol levels checked more often if: Your lipid or cholesterol levels are high. You are older than 67 years of age. You are at high risk for heart disease. What should I know about cancer screening? Depending on your health history and family history, you may need to  have cancer screening at various ages. This may include screening for: Breast cancer. Cervical cancer. Colorectal cancer. Skin cancer. Lung cancer. What should I know about heart disease, diabetes, and high blood pressure? Blood pressure and heart disease High blood pressure causes heart disease and increases the risk of stroke. This is more likely to develop in people who have high blood pressure readings or are overweight. Have your blood pressure checked: Every 3-5 years if you are 1-56 years of age. Every year if you are 101 years old or older. Diabetes Have regular diabetes screenings. This checks your fasting blood sugar level. Have the screening done: Once every three years after age 43 if you are at a normal weight and have a low risk for diabetes. More often and at a younger age if you are overweight or have a high risk for diabetes. What should I know about preventing infection? Hepatitis B If you have a higher risk for hepatitis B, you should be screened for this virus. Talk with your health care provider to find out if you are at risk for hepatitis B infection. Hepatitis C Testing is recommended for: Everyone born from [birth year] through 1965. Anyone with known risk factors for hepatitis C.  Sexually transmitted infections (STIs) Get screened for STIs, including gonorrhea and chlamydia, if: You are sexually active and are younger than 67 years of age. You are older than 67 years of age and your health care provider tells you that you are at risk for this type of infection. Your sexual activity has changed since you were last screened, and you are at increased risk for chlamydia or gonorrhea. Ask your health care provider if you are at risk. Ask your health care provider about whether you are at high risk for HIV. Your health care provider may recommend a prescription medicine to help prevent HIV infection. If you choose to take medicine to prevent HIV, you should first get tested for  HIV. You should then be tested every 3 months for as long as you are taking the medicine. Pregnancy If you are about to stop having your period (premenopausal) and you may become pregnant, seek counseling before you get pregnant. Take 400 to 800 micrograms (mcg) of folic acid every day if you become pregnant. Ask for birth control (contraception) if you want to prevent pregnancy. Osteoporosis and menopause Osteoporosis is a disease in which the bones lose minerals and strength with aging. This can result in bone fractures. If you are 80 years old or older, or if you are at risk for osteoporosis and fractures, ask your health care provider if you should: Be screened for bone loss. Take a calcium or vitamin D supplement to lower your risk of fractures. Be given hormone replacement therapy (HRT) to treat symptoms of menopause. Follow these instructions at home: Alcohol use Do not drink alcohol if: Your health care provider tells you not to drink. You are pregnant, may be pregnant, or are planning to become pregnant. If you drink alcohol: Limit how much you have to: 0-1 drink a day. Know how much alcohol is in your drink. In the [place] one drink equals one 12 oz bottle of beer (355 mL), one 5 oz glass of wine (148 mL), or one 1 oz glass of hard liquor (44 mL). Lifestyle Do not use any products that contain nicotine or tobacco. These products include cigarettes, chewing tobacco, and vaping devices, such as e-cigarettes. If you need help quitting, ask your health care provider. Do not use street drugs. Do not share needles. Ask your health care provider for help if you need support or information about quitting drugs. General instructions Schedule regular health, dental, and eye exams. Stay current with your vaccines. Tell your health care provider if: You often feel depressed. You have ever been abused or do not feel safe at home. Summary Adopting a healthy lifestyle and getting preventive  care are important in promoting health and wellness. Follow your health care provider's instructions about healthy diet, exercising, and getting tested or screened for diseases. Follow your health care provider's instructions on monitoring your cholesterol and blood pressure. This information is not intended to replace advice given to you by your health care provider. Make sure you discuss any questions you have with your health care provider. Document Revised: 03/17/2021 Document Reviewed: 03/17/2021 Elsevier Patient Education  2024 [employer].

## 2023-08-04 NOTE — Telephone Encounter (Signed)
I don't have anything from their office

## 2023-08-04 NOTE — Progress Notes (Signed)
MEDICARE ANNUAL WELLNESS VISIT  08/04/2023  Subjective:  Alexis Mcneil is a 67 y.o. female patient of Metheney, Barbarann Ehlers, MD who had a Medicare Annual Wellness Visit today. [name] is Retired and lives alone. she does not have any children. she reports that she is socially active and does interact with friends/family regularly. she is moderately physically active and enjoys yard work.  Patient Care Team: Agapito Games, MD as PCP - General     08/04/2023    9:58 AM 07/22/2022    9:11 AM 06/07/2020    1:56 PM 03/07/2018    3:48 PM 02/19/2014    4:04 PM 11/08/2012    8:44 AM  Advanced Directives  Does Patient Have a Medical Advance Directive? Yes Yes Yes Yes Patient has advance directive, copy not in chart Patient has advance directive, copy not in chart  Type of Advance Directive Living will Living will Healthcare Power of [place];Living will Healthcare Power of [place];Living will  Living will  Does patient want to make changes to medical advance directive? No - Patient declined No - Patient declined No - Patient declined     Copy of Healthcare Power of Attorney in Chart?   No - copy requested No - copy requested      Hospital Utilization Over the Past 12 Months: # of hospitalizations or ER visits: 0 # of surgeries: 0  Review of Systems    Patient reports that her overall health is better when compared to last year.  Review of Systems: History obtained from chart review and the patient  All other systems negative.  Pain Assessment Pain : 0-10 Pain Score: 3  Pain Type: Acute pain Pain Location: Shoulder Pain Orientation: Left Pain Descriptors / Indicators: Aching Pain Onset: More than a month ago Pain Frequency: Constant     Current Medications & Allergies (verified) Allergies as of 08/04/2023       Reactions   Niacin And Related Nausea And Vomiting   Passed out and threw up   Gabapentin Other (See Comments)   Vision changes        Medication List         Accurate as of August 04, 2023 10:17 AM. If you have any questions, ask your nurse or doctor.          Accu-Chek [employer] Kit Use to check blood sugar once daily. Dx code: E11.69   allopurinol 300 MG tablet Commonly known as: ZYLOPRIM TAKE ONE-HALF TABLET BY MOUTH  DAILY   AMBULATORY NON FORMULARY MEDICATION Medication Name: CPAP machine and supplies with humidifier.  Please set her CPAP to 9 cm water pressure. Then download after one week so can review her apneas on 9 cm water pressure.  [facility]. Dx. OSA G47.33   AMBULATORY NON FORMULARY MEDICATION Change auotpap and then do a download in 2 weeks.   atorvastatin 40 MG tablet Commonly known as: LIPITOR TAKE 1 TABLET BY MOUTH IN THE  EVENING AT 6PM   chlorthalidone 50 MG tablet Commonly known as: HYGROTON Take 1 tablet (50 mg total) by mouth daily.   DULoxetine 60 MG capsule Commonly known as: CYMBALTA TAKE 1 CAPSULE BY MOUTH DAILY   FreeStyle Lite Devi   glucose blood test strip Freestyle Lite test strips.  FOR CHECKING BLOOD SUGAR ONCE DAILY   ibuprofen 800 MG tablet Commonly known as: ADVIL Take 1 tablet (800 mg total) by mouth every 8 (eight) hours as needed.   levothyroxine 137 MCG tablet Commonly  known as: SYNTHROID TAKE 1 TABLET BY MOUTH ONCE  DAILY BEFORE BREAKFAST   losartan 100 MG tablet Commonly known as: COZAAR TAKE 1 TABLET BY MOUTH ONCE  DAILY   meloxicam 15 MG tablet Commonly known as: MOBIC TAKE 1 TABLET BY MOUTH ONCE  DAILY   pantoprazole 40 MG tablet Commonly known as: PROTONIX TAKE 1 TABLET BY MOUTH DAILY   potassium chloride SA 20 MEQ tablet Commonly known as: KLOR-CON M TAKE 1 TABLET BY MOUTH TWICE  DAILY   Semaglutide (2 MG/DOSE) 8 MG/3ML Sopn Inject 2 mg as directed once a week.        History (reviewed): Past Medical History:  Diagnosis Date   Anemia    Arthritis    KNEE,SHOULDERS   Cataract January 2021   Eye appt visit   Diabetes mellitus  without complication (HCC)    GERD (gastroesophageal reflux disease)    Heart murmur    Hyperlipidemia    Hypertension    IFG (impaired fasting glucose)    Neuromuscular disorder (HCC)    NEUROPATHY FEET   OSA (obstructive sleep apnea)    CPAP   Sleep apnea    CPAP   Thyroid disease    Past Surgical History:  Procedure Laterality Date   COLONOSCOPY     endometrial polyp     January 2014   EYE SURGERY  1988   Cyst on eye removed   mouth palate surgery repair  1961   large tear   ROOT CANEL     2 WEEKS AGO   rt knee surgery arthroscopy  2009   torn meniscus/medial   torn ligament shoulder  1975   repaired   Family History  Problem Relation Age of Onset   Colon cancer Father 74       colon/ polyps   Heart attack Father    Stroke Father    Cancer Father    Diabetes Father    Hyperlipidemia Mother        Living   Hypertension Mother    Arthritis Mother    Colon cancer Paternal Grandmother    Stroke Paternal Grandmother    Heart attack Paternal Grandmother        heart attack   Cancer Paternal Grandmother    Diabetes Paternal Grandmother    Diabetes Other        grandmother   Supraventricular tachycardia Sister    Hyperlipidemia Maternal Grandmother    Varicose Veins Maternal Grandmother    Cancer Paternal Aunt    Cancer Paternal Uncle    Esophageal cancer Neg Hx    Stomach cancer Neg Hx    Rectal cancer Neg Hx    Breast cancer Neg Hx    Social History   Socioeconomic History   Marital status: Single    Spouse name: Not on file   Number of children: Not on file   Years of education: 13   Highest education level: Associate degree: academic program  Occupational History   Occupation: Retired.  Tobacco Use   Smoking status: Never   Smokeless tobacco: Never  Vaping Use   Vaping status: Never Used  Substance and Sexual Activity   Alcohol use: Yes    Alcohol/week: 2.0 standard drinks of alcohol    Types: 2 Cans of beer per week    Comment: occasional  beer on the weekends.     Drug use: No   Sexual activity: Not Currently    Birth control/protection: Post-menopausal, None  Other  Topics Concern   Not on file  Social History Narrative   Lives alone. Has no children. Education: associates degree. Enjoys doing yard work.   Social Determinants of Health   Financial Resource Strain: Low Risk  (08/04/2023)   Overall Financial Resource Strain (CARDIA)    Difficulty of Paying Living Expenses: Not hard at all  Food Insecurity: No Food Insecurity (08/04/2023)   Hunger Vital Sign    Worried About Running Out of Food in the Last Year: Never true    Ran Out of Food in the Last Year: Never true  Transportation Needs: No Transportation Needs (08/04/2023)   PRAPARE - Administrator, Civil Service (Medical): No    Lack of Transportation (Non-Medical): No  Physical Activity: Insufficiently Active (08/04/2023)   Exercise Vital Sign    Days of Exercise per Week: 2 days    Minutes of Exercise per Session: 20 min  Stress: No Stress Concern Present (08/04/2023)   [employer] of Occupational Health - Occupational Stress Questionnaire    Feeling of Stress : Not at all  Recent Concern: Stress - Stress Concern Present (06/09/2023)   [employer] of Occupational Health - Occupational Stress Questionnaire    Feeling of Stress : To some extent  Social Connections: Socially Isolated (08/04/2023)   Social Connection and Isolation Panel [NHANES]    Frequency of Communication with Friends and Family: Once a week    Frequency of Social Gatherings with Friends and Family: Once a week    Attends Religious Services: Never    Database administrator or Organizations: No    Attends Banker Meetings: Never    Marital Status: Never married    Activities of Daily Living    08/04/2023    9:14 AM  In your present state of health, do you have any difficulty performing the following activities:  Hearing? 0  Vision? 0  Difficulty  concentrating or making decisions? 0  Walking or climbing stairs? 0  Dressing or bathing? 0  Doing errands, shopping? 0  Preparing Food and eating ? N  Using the Toilet? N  In the past six months, have you accidently leaked urine? Y  Do you have problems with loss of bowel control? N  Managing your Medications? N  Managing your Finances? N  Housekeeping or managing your Housekeeping? N    Patient Education/Literacy How often do you need to have someone help you when you read instructions, pamphlets, or other written materials from your doctor or pharmacy?: 1 - Never What is the last grade level you completed in school?: 2 years of college, certification  Exercise    Diet Patient reports consuming  2-3  meals a day and 2 snack(s) a day Patient reports that her primary diet is: Regular Patient reports that she does have regular access to food.   Depression Screen    08/04/2023    9:59 AM 10/22/2022    8:37 AM 07/27/2022    9:20 AM 07/22/2022    9:12 AM 06/12/2021    4:00 PM 03/11/2021    4:20 PM 05/20/2020    3:49 PM  PHQ 2/9 Scores  PHQ - 2 Score 0 0 0 0 0 0 0     Fall Risk    08/04/2023    9:58 AM 08/04/2023    9:14 AM 02/22/2023    8:35 AM 10/22/2022    8:36 AM 07/27/2022    9:20 AM  Fall Risk   Falls in  the past year? 0 0 1 1 0  Number falls in past yr: 0 0 1 1 0  Injury with Fall? 0 0 0 0 0  Risk for fall due to : No Fall Risks  No Fall Risks Other (Comment) No Fall Risks  Risk for fall due to: Comment    neuropathy   Follow up Falls evaluation completed  Falls evaluation completed Falls evaluation completed Falls evaluation completed     Objective:   BP 126/66 (BP Location: Right Arm, Patient Position: Sitting, Cuff Size: Large)   Pulse 70   Ht 5\' 5"  (1.651 m)   Wt 202 lb 1.9 oz (91.7 kg)   SpO2 98%   BMI 33.63 kg/m   Last Weight  Most recent update: 08/04/2023  9:55 AM    Weight  91.7 kg (202 lb 1.9 oz)             Body mass index is 33.63  kg/m.  Hearing/Vision  Alba did not have difficulty with hearing/understanding during the face-to-face interview Gracee did not have difficulty with her vision during the face-to-face interview Reports that she has had a formal eye exam by an eye care professional within the past year Reports that she has not had a formal hearing evaluation within the past year  Cognitive Function:    08/04/2023   10:06 AM 07/22/2022    9:17 AM  6CIT Screen  What Year? 0 points 0 points  What month? 0 points 0 points  What time? 0 points 0 points  Count back from 20 0 points 0 points  Months in reverse 0 points 0 points  Repeat phrase 0 points 0 points  Total Score 0 points 0 points    Normal Cognitive Function Screening: Yes (Normal:0-7, Significant for Dysfunction: >8)  Immunization & Health Maintenance Record Immunization History  Administered Date(s) Administered   Fluad Quad(high Dose 65+) 10/22/2022   Fluad Trivalent(High Dose 65+) 07/21/2023   Influenza Whole 09/09/2006   Influenza,inj,Quad PF,6+ Mos 08/16/2013, 08/10/2015, 07/22/2016   Influenza-Unspecified 07/23/2017, 07/13/2018, 08/07/2019, 08/05/2020, 08/12/2021   PFIZER(Purple Top)SARS-COV-2 Vaccination 11/07/2019, 11/28/2019, 10/17/2020   PNEUMOCOCCAL CONJUGATE-20 06/22/2022   Pneumococcal Polysaccharide-23 09/23/2015   Td 11/09/2004   Tdap 05/27/2015   Zoster Recombinant(Shingrix) 01/18/2020, 06/12/2021    Health Maintenance  Topic Date Due   COVID-19 Vaccine (4 - 2023-24 season) 08/20/2023 (Originally 07/11/2023)   DEXA SCAN  08/03/2024 (Originally 03/01/2018)   FOOT EXAM  10/23/2023   HEMOGLOBIN A1C  12/25/2023   OPHTHALMOLOGY EXAM  02/23/2024   Colonoscopy  04/26/2024   Diabetic kidney evaluation - eGFR measurement  06/23/2024   Diabetic kidney evaluation - Urine ACR  06/23/2024   Medicare Annual Wellness (AWV)  08/03/2024   MAMMOGRAM  08/27/2024   DTaP/Tdap/Td (3 - Td or Tdap) 05/26/2025   Pneumonia Vaccine 14+  Years old  Completed   INFLUENZA VACCINE  Completed   Hepatitis C Screening  Completed   Zoster Vaccines- Shingrix  Completed   HPV VACCINES  Aged Out       Assessment  This is a routine wellness examination for [employer].  Health Maintenance: Due or Overdue There are no preventive care reminders to display for this patient.   Clent Demark does not need a referral for [employer] Assistance: Care Management:   no Social Work:    no Prescription Assistance:  no Nutrition/Diabetes Education:  no   Plan:  Personalized Goals  Goals Addressed  This Visit's Progress     Patient Stated (pt-stated)        08/04/2023 AWV Goal: Exercise for General Health  Patient will verbalize understanding of the benefits of increased physical activity: Exercising regularly is important. It will improve your overall fitness, flexibility, and endurance. Regular exercise also will improve your overall health. It can help you control your weight, reduce stress, and improve your bone density. Over the next year, patient will increase physical activity as tolerated with a goal of at least 150 minutes of moderate physical activity per week.  You can tell that you are exercising at a moderate intensity if your heart starts beating faster and you start breathing faster but can still hold a conversation. Moderate-intensity exercise ideas include: Walking 1 mile (1.6 km) in about 15 minutes Biking Hiking Golfing Dancing Water aerobics Patient will verbalize understanding of everyday activities that increase physical activity by providing examples like the following: Yard work, such as: Insurance underwriter Gardening Washing windows or floors Patient will be able to explain general safety guidelines for exercising:  Before you start a new exercise program, talk with your health care provider. Do not  exercise so much that you hurt yourself, feel dizzy, or get very short of breath. Wear comfortable clothes and wear shoes with good support. Drink plenty of water while you exercise to prevent dehydration or heat stroke. Work out until your breathing and your heartbeat get faster.        Personalized Health Maintenance & Screening Recommendations  Screening mammography Bone densitometry screening  Lung Cancer Screening Recommended: no (Low Dose CT Chest recommended if Age 49-80 years, 20 pack-year currently smoking OR have quit w/in past 15 years) Hepatitis C Screening recommended: no HIV Screening recommended: no  Advanced Directives: Written information was not given per the patient's request.  Referrals & Orders Orders Placed This Encounter  Procedures   DEXAScan    Follow-up Plan Follow-up with Agapito Games, MD as planned Schedule bone density and mammogram.  Medicare wellness visit in one year.  AVS printed and given to the patient.   I have personally reviewed and noted the following in the patient's chart:   Medical and social history Use of alcohol, tobacco or illicit drugs  Current medications and supplements Functional ability and status Nutritional status Physical activity Advanced directives List of other physicians Hospitalizations, surgeries, and ER visits in previous 12 months Vitals Screenings to include cognitive, depression, and falls Referrals and appointments  In addition, I have reviewed and discussed with patient certain preventive protocols, quality metrics, and best practice recommendations. A written personalized care plan for preventive services as well as general preventive health recommendations were provided to patient.     Modesto Charon, RN BSN  08/04/2023

## 2023-08-05 ENCOUNTER — Encounter: Payer: Self-pay | Admitting: Family Medicine

## 2023-08-05 NOTE — Telephone Encounter (Signed)
Patient called.  Alexis Mcneil office /Dr. Everardo Pacific is refaxing surgical clearance form.  Please be on the look out for it. Surgery is scheduled for October 7th.

## 2023-08-16 ENCOUNTER — Other Ambulatory Visit (HOSPITAL_COMMUNITY): Payer: Self-pay

## 2023-08-16 DIAGNOSIS — M7542 Impingement syndrome of left shoulder: Secondary | ICD-10-CM | POA: Diagnosis not present

## 2023-08-16 DIAGNOSIS — M7552 Bursitis of left shoulder: Secondary | ICD-10-CM | POA: Diagnosis not present

## 2023-08-16 DIAGNOSIS — M7582 Other shoulder lesions, left shoulder: Secondary | ICD-10-CM | POA: Diagnosis not present

## 2023-08-16 DIAGNOSIS — S43432A Superior glenoid labrum lesion of left shoulder, initial encounter: Secondary | ICD-10-CM | POA: Diagnosis not present

## 2023-08-16 DIAGNOSIS — Y999 Unspecified external cause status: Secondary | ICD-10-CM | POA: Diagnosis not present

## 2023-08-16 DIAGNOSIS — M7522 Bicipital tendinitis, left shoulder: Secondary | ICD-10-CM | POA: Diagnosis not present

## 2023-08-16 DIAGNOSIS — S46012A Strain of muscle(s) and tendon(s) of the rotator cuff of left shoulder, initial encounter: Secondary | ICD-10-CM | POA: Diagnosis not present

## 2023-08-16 DIAGNOSIS — G8918 Other acute postprocedural pain: Secondary | ICD-10-CM | POA: Diagnosis not present

## 2023-08-16 DIAGNOSIS — M75122 Complete rotator cuff tear or rupture of left shoulder, not specified as traumatic: Secondary | ICD-10-CM | POA: Diagnosis not present

## 2023-08-16 DIAGNOSIS — M19012 Primary osteoarthritis, left shoulder: Secondary | ICD-10-CM | POA: Diagnosis not present

## 2023-08-16 DIAGNOSIS — M24112 Other articular cartilage disorders, left shoulder: Secondary | ICD-10-CM | POA: Diagnosis not present

## 2023-08-16 DIAGNOSIS — S46112A Strain of muscle, fascia and tendon of long head of biceps, left arm, initial encounter: Secondary | ICD-10-CM | POA: Diagnosis not present

## 2023-08-16 DIAGNOSIS — X58XXXA Exposure to other specified factors, initial encounter: Secondary | ICD-10-CM | POA: Diagnosis not present

## 2023-08-16 MED ORDER — ACETAMINOPHEN EXTRA STRENGTH 500 MG PO TABS
1000.0000 mg | ORAL_TABLET | Freq: Three times a day (TID) | ORAL | 0 refills | Status: DC | PRN
  Filled 2023-08-16: qty 84, 14d supply, fill #0

## 2023-08-16 MED ORDER — CELECOXIB 100 MG PO CAPS
100.0000 mg | ORAL_CAPSULE | Freq: Two times a day (BID) | ORAL | 2 refills | Status: DC
  Filled 2023-08-16: qty 60, 30d supply, fill #0
  Filled 2023-09-12: qty 60, 30d supply, fill #1
  Filled 2023-10-15: qty 60, 30d supply, fill #2

## 2023-08-16 MED ORDER — ONDANSETRON 4 MG PO TBDP
4.0000 mg | ORAL_TABLET | Freq: Three times a day (TID) | ORAL | 0 refills | Status: DC
  Filled 2023-08-16: qty 15, 5d supply, fill #0

## 2023-08-16 MED ORDER — OXYCODONE HCL 5 MG PO TABS
5.0000 mg | ORAL_TABLET | Freq: Four times a day (QID) | ORAL | 0 refills | Status: DC | PRN
Start: 2023-08-16 — End: 2023-10-22
  Filled 2023-08-16: qty 30, 4d supply, fill #0

## 2023-08-19 ENCOUNTER — Ambulatory Visit: Payer: Medicare Other | Admitting: Physical Therapy

## 2023-08-26 DIAGNOSIS — M19012 Primary osteoarthritis, left shoulder: Secondary | ICD-10-CM | POA: Diagnosis not present

## 2023-08-30 ENCOUNTER — Telehealth: Payer: Self-pay | Admitting: Family Medicine

## 2023-08-30 DIAGNOSIS — E038 Other specified hypothyroidism: Secondary | ICD-10-CM

## 2023-08-30 NOTE — Telephone Encounter (Signed)
[facility] called they're requesting a manufacture change from Amneal to Lupin for Levothyroxine 137 MCG   Phone [phone]

## 2023-08-30 NOTE — Telephone Encounter (Signed)
Okay for change will need to repeat TSH in 6 weeks.

## 2023-08-31 ENCOUNTER — Ambulatory Visit: Payer: Medicare Other

## 2023-08-31 NOTE — Telephone Encounter (Signed)
[facility] advised and patient advised. Ordered TSH.

## 2023-08-31 NOTE — Addendum Note (Signed)
Addended by: Chalmers Cater on: 08/31/2023 09:11 AM   Modules accepted: Orders

## 2023-09-07 ENCOUNTER — Other Ambulatory Visit: Payer: Self-pay | Admitting: Family Medicine

## 2023-09-14 ENCOUNTER — Other Ambulatory Visit (HOSPITAL_COMMUNITY): Payer: Self-pay

## 2023-09-17 ENCOUNTER — Telehealth: Payer: Self-pay

## 2023-09-17 NOTE — Telephone Encounter (Signed)
Forwarding to Cjeryl as an [place].  [employer] PAP shipment for Ozempic 2 mg dose / 2 boxes received this morning. I've contacted the patient to come and pick up their order today. Placed in the PAP fridge with patient identifier. Thanks in advance.   NDC: [id number] LOT: [id number] EXP: 2026-05-08

## 2023-09-27 ENCOUNTER — Ambulatory Visit: Payer: Medicare Other | Attending: Family Medicine | Admitting: Rehabilitative and Restorative Service Providers"

## 2023-09-27 ENCOUNTER — Other Ambulatory Visit: Payer: Self-pay

## 2023-09-27 ENCOUNTER — Encounter: Payer: Self-pay | Admitting: Rehabilitative and Restorative Service Providers"

## 2023-09-27 DIAGNOSIS — M25512 Pain in left shoulder: Secondary | ICD-10-CM | POA: Diagnosis not present

## 2023-09-27 DIAGNOSIS — M6281 Muscle weakness (generalized): Secondary | ICD-10-CM | POA: Diagnosis not present

## 2023-09-27 DIAGNOSIS — Z9889 Other specified postprocedural states: Secondary | ICD-10-CM | POA: Insufficient documentation

## 2023-09-27 NOTE — Therapy (Signed)
OUTPATIENT PHYSICAL THERAPY SHOULDER EVALUATION   Patient Name: BRENLEIGH SANTANA MRN: [id number] DOB:04-22-1956, 67 y.o., female Today's Date: 09/27/2023  END OF SESSION:  PT End of Session - 09/27/23 1056     Visit Number 1    Number of Visits 16    Date for PT Re-Evaluation 11/26/23    Authorization Type medicare    Progress Note Due on Visit 10    PT Start Time 1058    PT Stop Time 1140    PT Time Calculation (min) 42 min    Activity Tolerance Patient tolerated treatment well    Behavior During Therapy [facility] for tasks assessed/performed             Past Medical History:  Diagnosis Date   Anemia    Arthritis    KNEE,SHOULDERS   Cataract January 2021   Eye appt visit   Diabetes mellitus without complication (HCC)    GERD (gastroesophageal reflux disease)    Heart murmur    Hyperlipidemia    Hypertension    IFG (impaired fasting glucose)    Neuromuscular disorder (HCC)    NEUROPATHY FEET   OSA (obstructive sleep apnea)    CPAP   Sleep apnea    CPAP   Thyroid disease    Past Surgical History:  Procedure Laterality Date   COLONOSCOPY     endometrial polyp     January 2014   EYE SURGERY  1988   Cyst on eye removed   mouth palate surgery repair  1961   large tear   ROOT CANEL     2 WEEKS AGO   rt knee surgery arthroscopy  2009   torn meniscus/medial   torn ligament shoulder  1975   repaired   Patient Active Problem List   Diagnosis Date Noted   Primary osteoarthritis of right knee 12/04/2021   CKD (chronic kidney disease) stage 3, GFR 30-59 ml/min (HCC) 11/12/2021   Pre-ulcerative corn or callous 05/20/2020   Excessive sweating 05/20/2020   Gout 03/07/2018   Hereditary and idiopathic peripheral neuropathy 09/24/2015   Trochanteric bursitis of both hips 10/08/2014   Lumbar spinal stenosis 09/06/2014   Right knee pain 09/06/2014   Obstructive sleep apnea 09/26/2010   Left rotator cuff tear 03/18/2010   PARESTHESIA, HANDS 03/18/2010   Diabetes  mellitus type 2 in obese 02/07/2008   Hypothyroidism 01/06/2007   Hyperlipidemia 01/06/2007   ANEMIA NOS 01/06/2007   HYPERTENSION, BENIGN ESSENTIAL 01/06/2007    PCP: Nani Gasser, MD  REFERRING PROVIDER: Ramond Marrow, MD  REFERRING DIAG: [phone] (ICD-10-CM) - S/P arthroscopy of left shoulder Z98.890 (ICD-10-CM) - S/P left rotator cuff repair  THERAPY DIAG:  Acute pain of left shoulder  Muscle weakness (generalized)  Rationale for Evaluation and Treatment: Rehabilitation  ONSET DATE: 08/16/23  SUBJECTIVE:  SUBJECTIVE STATEMENT: The patient reports she had an injury when she was installing shelves in her closet in late September/early August. She reports she felt a pop in her shoulder. She underwent L rotator cuff repair with SAD, distal clavicle excision, biceps tenodesis, and repair of subscapularis and supraspinatus. Due to extent of repair, she did not start physical therapy to allow tissue healing to occur. She is wearing sling until she returns to surgeon on Friday.  Hand dominance: Right  PERTINENT HISTORY: Diabetes, h/o R shoulder injury, HTN, bilat hip bursitis, hypothyroidism  PAIN:  Are you having pain? Yes: NPRS scale: 0/10 Pain location: shoulder  Pain description: soreness Aggravating factors: movement at times Relieving factors: none  PRECAUTIONS: Shoulder  WEIGHT BEARING RESTRICTIONS: follow protocol  FALLS:  Has patient fallen in last 6 months? No  LIVING ENVIRONMENT: Lives with: lives with their family Lives in: House/apartment  OCCUPATION: Retired   PLOF: Independent  PATIENT GOALS:return to full use of arm.   NEXT MD VISIT: 10/01/23  OBJECTIVE:  Note: Objective measures were completed at Evaluation unless otherwise noted.  PATIENT SURVEYS:  FOTO 22% (goal  to 68%)  COGNITION: Overall cognitive status: Within functional limits for tasks assessed     SENSATION: WFL  POSTURE: Rounded shoulders, forward head  UPPER EXTREMITY ROM:  Neck ROM 30 degrees extension, and 20 degrees of lateral SB bilat with pain and rotation to 50 degrees bilat Passive ROM Right eval Left eval  Shoulder flexion  90   Shoulder extension    Shoulder abduction  60 in plane of scapula  Shoulder adduction    Shoulder internal rotation    Shoulder external rotation  38 with arm by side  Elbow flexion  WNLs  Elbow extension  WNLs  Wrist flexion    Wrist extension  Mild limitation 60 deg  Wrist ulnar deviation    Wrist radial deviation    Wrist pronation    Wrist supination    (Blank rows = not tested)  UPPER EXTREMITY MMT: NOT TESTED MMT Right eval Left eval  Shoulder flexion    Shoulder extension    Shoulder abduction    Shoulder adduction    Shoulder internal rotation    Shoulder external rotation    Middle trapezius    Lower trapezius    Elbow flexion    Elbow extension    Wrist flexion    Wrist extension    Wrist ulnar deviation    Wrist radial deviation    Wrist pronation    Wrist supination    Grip strength (lbs)    (Blank rows = not tested)  JOINT MOBILITY TESTING:  N/a  PALPATION:  Soreness in anterior pectoralis musculature, and through lateral deltoid    [facility] Adult PT Treatment:                                                DATE: 09/27/23 Therapeutic Exercise: Supine PROM ER to [facility] with cane -- giving cues to stay at 40 degrees and practicing  Elbow flexion/extension Sidelying Gentle Scapular mobilization before performing PROM due to neck tightness  Scapular squeeze x 8 reps with tactile cues Seated AAROM shoulder flexion with weight of UE supported on table x 5 reps  Wrist AROM Standing Pendulums with R UE support on elevated mat Manual Therapy: PROM abduction in scapular plane to 60  degrees, ER with UE  positioned at side, flexion to 90 degrees Modalities: Vaso-- x 10 minutes 32 degrees L shoulder Self Care: Discussed HEP, not progressing too fast at home (has started trying to use extremity in ADLs), using sling until MD f/u on Friday  PATIENT EDUCATION: Education details: HEP Person educated: Patient Education method: Programmer, multimedia, Facilities manager, and Handouts Education comprehension: verbalized understanding, returned demonstration, and needs further education  HOME EXERCISE PROGRAM: Access Code: [email or web address] URL: [email or web address] Date: 09/27/2023 Prepared by: Margretta Ditty  Program Notes DON'T PUSH INTO PAIN. Gentle movement to begin. Wear sling until your appointment on Friday.   Exercises - Circular Shoulder Pendulum with Table Support  - 2 x daily - 7 x weekly - 1 sets - 10 reps - Wrist AROM Flexion Extension  - 2 x daily - 7 x weekly - 1 sets - 10 reps - Seated Shoulder Flexion Towel Slide at Table Top  - 2 x daily - 7 x weekly - 1 sets - 10 reps - Elbow AROM Flexion/Extension Forearm in Neutral in Supine  - 2 x daily - 7 x weekly - 1 sets - 10 reps - Supine Shoulder External Rotation AAROM with Dowel  - 2 x daily - 7 x weekly - 1 sets - 10 reps  ASSESSMENT:  CLINICAL IMPRESSION: Patient is a 67 y.o. female who was seen today for physical therapy evaluation and treatment s/p L RTC repair on 08/16/23. Due to complexity of her surgery, PT was held until 6 weeks. The patient tolerated evaluation with minimal tightness at end range (mostly with forward flexion). She does report using her L UE at times in daily tasks and c/o soreness if she attempts to lift it. PT discussed at this time she should only perform PROM in therapy and some AAROM at home (per HEP) for the shoulder--- we will progress to patient tissue tolerance. PT to address deficits to gain functional use of the L UE.   OBJECTIVE IMPAIRMENTS: decreased activity tolerance, decreased ROM, decreased  strength, hypomobility, increased edema, increased fascial restrictions, impaired flexibility, impaired UE functional use, postural dysfunction, and pain.   ACTIVITY LIMITATIONS: carrying, lifting, dressing, reach over head, and hygiene/grooming  PARTICIPATION LIMITATIONS: cleaning, driving, community activity, and yard work  PERSONAL FACTORS: 1-2 comorbidities: HTN, diabetes  are also affecting patient's functional outcome.   REHAB POTENTIAL: Good  CLINICAL DECISION MAKING: Stable/uncomplicated  EVALUATION COMPLEXITY: Low   GOALS: Goals reviewed with patient? Yes  SHORT TERM GOALS: Target date: 10/27/23  The patient will be indep with HEP within protocol. Baseline: initiated at eval Goal status: MET   2. The patient will improve AAROM and/or PROM to 140 degrees shoulder flexion. Baseline:  see chart above. Goal status: ONOING     LONG TERM GOALS: Target date: 11/26/2023  The patient will be indep with HEP. Baseline: initiated at eval Goal status: INITIAL   2.   The patient will improve FOTO score to > or equal to 48% Baseline: 22% Goal status: INITIAL   3. The patient will demonstrate AROM to 120 degrees flexion, 45 degrees ER, and 90 degrees abduction in supine. Baseline: see chart  Goal status: INITIAL   4.  The patient  will demo improved upright posture with posterior shoulder girdle engaged to promote improved glenohumeral joint mobility. Baseline: rounded positioning Goal status: INITIAL   5.  The patient will have MMT performed when appropriate in protocol-- goal to follow. Baseline: not tested Goal status: INITIAL   PLAN:  PT  FREQUENCY: 2x/week  PT DURATION: 8 weeks  PLANNED INTERVENTIONS: 97110-Therapeutic exercises, 97530- Therapeutic activity, [id number]- Neuromuscular re-education, 97535- Self Care, 29562- Manual therapy, 97014- Electrical stimulation (unattended), 97016- Vasopneumatic device, 97033- Ionotophoresis 4mg /ml Dexamethasone, Patient/Family  education, Taping, Dry Needling, Joint mobilization, Scar mobilization, Cryotherapy, and Moist heat  PLAN FOR NEXT SESSION: progress PROM to tolerance, AAROM with weight of arm supported, progress per protocol recognizing that we are starting 6 weeks post surgery so will progress to tissue tolerance and with guidance from the protocol.    Laverna Dossett, PT 09/27/2023, 12:58 PM

## 2023-10-01 DIAGNOSIS — M19012 Primary osteoarthritis, left shoulder: Secondary | ICD-10-CM | POA: Diagnosis not present

## 2023-10-03 NOTE — Therapy (Unsigned)
OUTPATIENT PHYSICAL THERAPY SHOULDER TREATMENT   Patient Name: Alexis Mcneil MRN: [id number] DOB:24-Mar-1956, 67 y.o., female Today's Date: 10/04/2023  END OF SESSION:  PT End of Session - 10/04/23 1149     Visit Number 2    Number of Visits 16    Date for PT Re-Evaluation 11/26/23    Authorization Type medicare    Progress Note Due on Visit 10    PT Start Time 1149    PT Stop Time 1237    PT Time Calculation (min) 48 min    Activity Tolerance Patient tolerated treatment well    Behavior During Therapy [facility] for tasks assessed/performed              Past Medical History:  Diagnosis Date   Anemia    Arthritis    KNEE,SHOULDERS   Cataract January 2021   Eye appt visit   Diabetes mellitus without complication (HCC)    GERD (gastroesophageal reflux disease)    Heart murmur    Hyperlipidemia    Hypertension    IFG (impaired fasting glucose)    Neuromuscular disorder (HCC)    NEUROPATHY FEET   OSA (obstructive sleep apnea)    CPAP   Sleep apnea    CPAP   Thyroid disease    Past Surgical History:  Procedure Laterality Date   COLONOSCOPY     endometrial polyp     January 2014   EYE SURGERY  1988   Cyst on eye removed   mouth palate surgery repair  1961   large tear   ROOT CANEL     2 WEEKS AGO   rt knee surgery arthroscopy  2009   torn meniscus/medial   torn ligament shoulder  1975   repaired   Patient Active Problem List   Diagnosis Date Noted   Primary osteoarthritis of right knee 12/04/2021   CKD (chronic kidney disease) stage 3, GFR 30-59 ml/min (HCC) 11/12/2021   Pre-ulcerative corn or callous 05/20/2020   Excessive sweating 05/20/2020   Gout 03/07/2018   Hereditary and idiopathic peripheral neuropathy 09/24/2015   Trochanteric bursitis of both hips 10/08/2014   Lumbar spinal stenosis 09/06/2014   Right knee pain 09/06/2014   Obstructive sleep apnea 09/26/2010   Left rotator cuff tear 03/18/2010   PARESTHESIA, HANDS 03/18/2010   Diabetes  mellitus type 2 in obese 02/07/2008   Hypothyroidism 01/06/2007   Hyperlipidemia 01/06/2007   ANEMIA NOS 01/06/2007   HYPERTENSION, BENIGN ESSENTIAL 01/06/2007    PCP: Nani Gasser, MD  REFERRING PROVIDER: Ramond Marrow, MD  REFERRING DIAG: [phone] (ICD-10-CM) - S/P arthroscopy of left shoulder Z98.890 (ICD-10-CM) - S/P left rotator cuff repair  THERAPY DIAG:  Acute pain of left shoulder  Muscle weakness (generalized)  Rationale for Evaluation and Treatment: Rehabilitation  ONSET DATE: 08/16/23  SUBJECTIVE:  SUBJECTIVE STATEMENT: The patient saw Dr. Everardo Pacific and can d/c the sling. She notes she is doing more with her left hand than she should. Pain goes up with AROM at home (during chores).   EVAL: The patient reports she had an injury when she was installing shelves in her closet in late September/early August. She reports she felt a pop in her shoulder. She underwent L rotator cuff repair with SAD, distal clavicle excision, biceps tenodesis, and repair of subscapularis and supraspinatus. Due to extent of repair, she did not start physical therapy to allow tissue healing to occur. She is wearing sling until she returns to surgeon on Friday.  Hand dominance: Right  PERTINENT HISTORY: Diabetes, h/o R shoulder injury, HTN, bilat hip bursitis, hypothyroidism  PAIN:  Are you having pain? Yes: NPRS scale: 0/10 Pain location: shoulder  Pain description: soreness Aggravating factors: movement at times Relieving factors: none  PRECAUTIONS: Shoulder  WEIGHT BEARING RESTRICTIONS: follow protocol  FALLS:  Has patient fallen in last 6 months? No  PATIENT GOALS:return to full use of arm.   NEXT MD VISIT: 10/01/23  OBJECTIVE:  Note: Objective measures were completed at Evaluation unless otherwise  noted. PATIENT SURVEYS:  FOTO 22% (goal to 68%) POSTURE: Rounded shoulders, forward head UPPER EXTREMITY ROM:  Neck ROM 30 degrees extension, and 20 degrees of lateral SB bilat with pain and rotation to 50 degrees bilat Passive ROM Right eval Left eval Left 10/04/23  Shoulder flexion  90  AAROM with cane 140  Shoulder extension     Shoulder abduction  60 in plane of scapula   Shoulder adduction     Shoulder internal rotation     Shoulder external rotation  38 with arm by side 40 with arm by side AAROM cane  Elbow flexion  WNLs   Elbow extension  WNLs   Wrist flexion     Wrist extension  Mild limitation 60 deg   Wrist ulnar deviation     Wrist radial deviation     Wrist pronation     Wrist supination     (Blank rows = not tested) UPPER EXTREMITY MMT: NOT TESTED MMT Right eval Left eval  Shoulder flexion    Shoulder extension    Shoulder abduction    Shoulder adduction    Shoulder internal rotation    Shoulder external rotation    Middle trapezius    Lower trapezius    Elbow flexion    Elbow extension    Wrist flexion    Wrist extension    Wrist ulnar deviation    Wrist radial deviation    Wrist pronation    Wrist supination    Grip strength (lbs)    (Blank rows = not tested) PALPATION:  Soreness in anterior pectoralis musculature, and through lateral deltoid    [facility] Adult PT Treatment:                                                DATE: 10/04/23 Therapeutic Exercise: Standing Physioball AAROM flexion (arm supported on ball) Physioball abduction within tolerable ROM Supine PROM  flexion, abduction, ER with arm at neutral Scapular retraction  x 10 reps AAROM cane press AAROM shoulder flexion Seated AAROM ER cane Manual Therapy: STM neck: scalenes, upper trap, suboccipital release Modalities: Game ready x 20 minutes   [facility] Adult PT Treatment:  DATE: 09/27/23 Therapeutic Exercise: Supine PROM ER to  [facility] with cane -- giving cues to stay at 40 degrees and practicing  Elbow flexion/extension Sidelying Gentle Scapular mobilization before performing PROM due to neck tightness  Scapular squeeze x 8 reps with tactile cues Seated AAROM shoulder flexion with weight of UE supported on table x 5 reps  Wrist AROM Standing Pendulums with R UE support on elevated mat Manual Therapy: PROM abduction in scapular plane to 60 degrees, ER with UE positioned at side, flexion to 90 degrees Modalities: Vaso-- x 10 minutes 32 degrees L shoulder Self Care: Discussed HEP, not progressing too fast at home (has started trying to use extremity in ADLs), using sling until MD f/u on Friday  PATIENT EDUCATION: Education details: HEP Person educated: Patient Education method: Programmer, multimedia, Facilities manager, and Handouts Education comprehension: verbalized understanding, returned demonstration, and needs further education  HOME EXERCISE PROGRAM: Access Code: [email or web address] URL: [email or web address] Date: 10/04/2023 Prepared by: Margretta Ditty  Program Notes DON'T PUSH INTO PAIN. Gentle movement to begin. Wear sling until your appointment on Friday.   Exercises - Circular Shoulder Pendulum with Table Support  - 2 x daily - 7 x weekly - 1 sets - 10 reps - Seated Shoulder Flexion Towel Slide at Table Top  - 2 x daily - 7 x weekly - 1 sets - 10 reps - Seated Shoulder External Rotation AAROM with Cane and Hand in Neutral  - 2 x daily - 7 x weekly - 1 sets - 10 reps - Supine Shoulder Flexion with Dowel  - 2 x daily - 7 x weekly - 1 sets - 10 reps - Supine Shoulder Flexion Extension AAROM with Dowel  - 2 x daily - 7 x weekly - 1 sets - 10 reps - Supine Scapular Retraction  - 2 x daily - 7 x weekly - 1 sets - 10 reps  ASSESSMENT:  CLINICAL IMPRESSION: The patient has progressed ROM today with AAROM in flexion and ER. We modified ER AAROM for home to sitting. PT to continue to progress per protocol and  tissue tolerance. Patient has transitioned to no sling and notes she is using the shoulder more frequently in ADLs (may wear sling for protection during tasks due to over using).   EVAL:Patient is a 67 y.o. female who was seen today for physical therapy evaluation and treatment s/p L RTC repair on 08/16/23. Due to complexity of her surgery, PT was held until 6 weeks. The patient tolerated evaluation with minimal tightness at end range (mostly with forward flexion). She does report using her L UE at times in daily tasks and c/o soreness if she attempts to lift it. PT discussed at this time she should only perform PROM in therapy and some AAROM at home (per HEP) for the shoulder--- we will progress to patient tissue tolerance. PT to address deficits to gain functional use of the L UE.   OBJECTIVE IMPAIRMENTS: decreased activity tolerance, decreased ROM, decreased strength, hypomobility, increased edema, increased fascial restrictions, impaired flexibility, impaired UE functional use, postural dysfunction, and pain.    GOALS: Goals reviewed with patient? Yes  SHORT TERM GOALS: Target date: 10/27/23  The patient will be indep with HEP within protocol. Baseline: initiated at eval Goal status: MET   2. The patient will improve AAROM and/or PROM to 140 degrees shoulder flexion. Baseline:  see chart above. Goal status: ONOING     LONG TERM GOALS: Target date: 11/26/2023  The patient will be indep with HEP. Baseline:  initiated at eval Goal status: INITIAL   2.   The patient will improve FOTO score to > or equal to 48% Baseline: 22% Goal status: INITIAL   3. The patient will demonstrate AROM to 120 degrees flexion, 45 degrees ER, and 90 degrees abduction in supine. Baseline: see chart  Goal status: INITIAL   4.  The patient  will demo improved upright posture with posterior shoulder girdle engaged to promote improved glenohumeral joint mobility. Baseline: rounded positioning Goal status:  INITIAL   5.  The patient will have MMT performed when appropriate in protocol-- goal to follow. Baseline: not tested Goal status: INITIAL   PLAN:  PT FREQUENCY: 2x/week  PT DURATION: 8 weeks  PLANNED INTERVENTIONS: 97110-Therapeutic exercises, 97530- Therapeutic activity, [id number]- Neuromuscular re-education, 97535- Self Care, 40981- Manual therapy, 97014- Electrical stimulation (unattended), 97016- Vasopneumatic device, 97033- Ionotophoresis 4mg /ml Dexamethasone, Patient/Family education, Taping, Dry Needling, Joint mobilization, Scar mobilization, Cryotherapy, and Moist heat  PLAN FOR NEXT SESSION: progress PROM to tolerance, AAROM with weight of arm supported, progress per protocol recognizing that we are starting 6 weeks post surgery so will progress to tissue tolerance and with guidance from the protocol.    Tyishia Aune, PT 10/04/2023, 1:13 PM

## 2023-10-04 ENCOUNTER — Encounter: Payer: Self-pay | Admitting: Rehabilitative and Restorative Service Providers"

## 2023-10-04 ENCOUNTER — Ambulatory Visit: Payer: Medicare Other | Admitting: Rehabilitative and Restorative Service Providers"

## 2023-10-04 DIAGNOSIS — M25512 Pain in left shoulder: Secondary | ICD-10-CM | POA: Diagnosis not present

## 2023-10-04 DIAGNOSIS — M6281 Muscle weakness (generalized): Secondary | ICD-10-CM | POA: Diagnosis not present

## 2023-10-04 DIAGNOSIS — Z9889 Other specified postprocedural states: Secondary | ICD-10-CM | POA: Diagnosis not present

## 2023-10-06 ENCOUNTER — Ambulatory Visit: Payer: Medicare Other | Admitting: Rehabilitative and Restorative Service Providers"

## 2023-10-06 ENCOUNTER — Encounter: Payer: Self-pay | Admitting: Rehabilitative and Restorative Service Providers"

## 2023-10-06 DIAGNOSIS — Z9889 Other specified postprocedural states: Secondary | ICD-10-CM | POA: Diagnosis not present

## 2023-10-06 DIAGNOSIS — M6281 Muscle weakness (generalized): Secondary | ICD-10-CM | POA: Diagnosis not present

## 2023-10-06 DIAGNOSIS — M25512 Pain in left shoulder: Secondary | ICD-10-CM

## 2023-10-06 NOTE — Therapy (Signed)
OUTPATIENT PHYSICAL THERAPY SHOULDER TREATMENT   Patient Name: Alexis Mcneil MRN: [id number] DOB:July 21, 1956, 67 y.o., female Today's Date: 10/06/2023  END OF SESSION:  PT End of Session - 10/06/23 1520     Visit Number 3    Number of Visits 16    Date for PT Re-Evaluation 11/26/23    Authorization Type medicare    Progress Note Due on Visit 10    PT Start Time 1521    PT Stop Time 1614    PT Time Calculation (min) 53 min    Activity Tolerance Patient tolerated treatment well              Past Medical History:  Diagnosis Date   Anemia    Arthritis    KNEE,SHOULDERS   Cataract January 2021   Eye appt visit   Diabetes mellitus without complication (HCC)    GERD (gastroesophageal reflux disease)    Heart murmur    Hyperlipidemia    Hypertension    IFG (impaired fasting glucose)    Neuromuscular disorder (HCC)    NEUROPATHY FEET   OSA (obstructive sleep apnea)    CPAP   Sleep apnea    CPAP   Thyroid disease    Past Surgical History:  Procedure Laterality Date   COLONOSCOPY     endometrial polyp     January 2014   EYE SURGERY  1988   Cyst on eye removed   mouth palate surgery repair  1961   large tear   ROOT CANEL     2 WEEKS AGO   rt knee surgery arthroscopy  2009   torn meniscus/medial   torn ligament shoulder  1975   repaired   Patient Active Problem List   Diagnosis Date Noted   Primary osteoarthritis of right knee 12/04/2021   CKD (chronic kidney disease) stage 3, GFR 30-59 ml/min (HCC) 11/12/2021   Pre-ulcerative corn or callous 05/20/2020   Excessive sweating 05/20/2020   Gout 03/07/2018   Hereditary and idiopathic peripheral neuropathy 09/24/2015   Trochanteric bursitis of both hips 10/08/2014   Lumbar spinal stenosis 09/06/2014   Right knee pain 09/06/2014   Obstructive sleep apnea 09/26/2010   Left rotator cuff tear 03/18/2010   PARESTHESIA, HANDS 03/18/2010   Diabetes mellitus type 2 in obese 02/07/2008   Hypothyroidism 01/06/2007    Hyperlipidemia 01/06/2007   ANEMIA NOS 01/06/2007   HYPERTENSION, BENIGN ESSENTIAL 01/06/2007    PCP: Nani Gasser, MD  REFERRING PROVIDER: Ramond Marrow, MD  REFERRING DIAG: [phone] (ICD-10-CM) - S/P arthroscopy of left shoulder Z98.890 (ICD-10-CM) - S/P left rotator cuff repair  THERAPY DIAG:  Acute pain of left shoulder  Muscle weakness (generalized)  Rationale for Evaluation and Treatment: Rehabilitation  ONSET DATE: 08/16/23  SUBJECTIVE:  SUBJECTIVE STATEMENT: The patient reports that her shoulder is sore today - maybe from the exercises; maybe slept wrong. She is not using the sling at home. Catches herself reaching and trying to use the L arm for functional activities. Did not ice shoulder much today. Doing some cooking and will be travelling for Thanksgiving.   EVAL: The patient reports she had an injury when she was installing shelves in her closet in late September/early August. She reports she felt a pop in her shoulder. She underwent L rotator cuff repair with SAD, distal clavicle excision, biceps tenodesis, and repair of subscapularis and supraspinatus. Due to extent of repair, she did not start physical therapy to allow tissue healing to occur. She is wearing sling until she returns to surgeon on Friday.  Hand dominance: Right  PERTINENT HISTORY: Diabetes, h/o R shoulder injury, HTN, bilat hip bursitis, hypothyroidism  PAIN:  Are you having pain? Yes: NPRS scale: 2/10 Pain location: shoulder  Pain description: soreness Aggravating factors: movement at times Relieving factors: none  PRECAUTIONS: Shoulder  WEIGHT BEARING RESTRICTIONS: follow protocol  FALLS:  Has patient fallen in last 6 months? No  PATIENT GOALS:return to full use of arm.   NEXT MD VISIT:  10/01/23  OBJECTIVE:  Note: Objective measures were completed at Evaluation unless otherwise noted. PATIENT SURVEYS:  FOTO 22% (goal to 68%) POSTURE: Rounded shoulders, forward head UPPER EXTREMITY ROM:  Neck ROM 30 degrees extension, and 20 degrees of lateral SB bilat with pain and rotation to 50 degrees bilat Passive ROM Right eval Left eval Left 10/04/23  Shoulder flexion  90  AAROM with cane 140  Shoulder extension     Shoulder abduction  60 in plane of scapula   Shoulder adduction     Shoulder internal rotation     Shoulder external rotation  38 with arm by side 40 with arm by side AAROM cane  Elbow flexion  WNLs   Elbow extension  WNLs   Wrist flexion     Wrist extension  Mild limitation 60 deg   Wrist ulnar deviation     Wrist radial deviation     Wrist pronation     Wrist supination     (Blank rows = not tested) UPPER EXTREMITY MMT: NOT TESTED MMT Right eval Left eval  Shoulder flexion    Shoulder extension    Shoulder abduction    Shoulder adduction    Shoulder internal rotation    Shoulder external rotation    Middle trapezius    Lower trapezius    Elbow flexion    Elbow extension    Wrist flexion    Wrist extension    Wrist ulnar deviation    Wrist radial deviation    Wrist pronation    Wrist supination    Grip strength (lbs)    (Blank rows = not tested) PALPATION:  Soreness in anterior pectoralis musculature, and through lateral deltoid    [facility] Adult PT Treatment:                                                DATE: 10/06/23 Therapeutic Exercise: Standing Scap squeeze with noodle along spine 5 sec x 10  AAROM ER with cane elbow at 90 deg flexion 5 sec x 10  Supine Scapular retraction  x 10 reps Seated Scap squeeze with noodle 5-10 reps  Sitting with noodle  Gentle thoracic extension with coregeous ball 1-2 min  Manual Therapy: STM cervical; upper trap; pecs; biceps; deltoid area  PROM into L shoulder flexion; scaption; ER in scapular  plane  Self care: Myofacial ball release standing 4 inch plastic ball  Modalities: Game ready 34 deg x 15 minutes  [facility] Adult PT Treatment:                                                DATE: 10/04/23 Therapeutic Exercise: Standing Physioball AAROM flexion (arm supported on ball) Physioball abduction within tolerable ROM Supine PROM  flexion, abduction, ER with arm at neutral Scapular retraction  x 10 reps AAROM cane press AAROM shoulder flexion Seated AAROM ER cane Manual Therapy: STM neck: scalenes, upper trap, suboccipital release Modalities: Game ready x 20 minutes   [facility] Adult PT Treatment:                                                DATE: 09/27/23 Therapeutic Exercise: Supine PROM ER to [facility] with cane -- giving cues to stay at 40 degrees and practicing  Elbow flexion/extension Sidelying Gentle Scapular mobilization before performing PROM due to neck tightness  Scapular squeeze x 8 reps with tactile cues Seated AAROM shoulder flexion with weight of UE supported on table x 5 reps  Wrist AROM Standing Pendulums with R UE support on elevated mat Manual Therapy: PROM abduction in scapular plane to 60 degrees, ER with UE positioned at side, flexion to 90 degrees Modalities: Vaso-- x 10 minutes 32 degrees L shoulder Self Care: Discussed HEP, not progressing too fast at home (has started trying to use extremity in ADLs), using sling until MD f/u on Friday  PATIENT EDUCATION: Education details: HEP Person educated: Patient Education method: Programmer, multimedia, Facilities manager, and Handouts Education comprehension: verbalized understanding, returned demonstration, and needs further education  HOME EXERCISE PROGRAM: Access Code: [email or web address] URL: [email or web address] Date: 10/06/2023 Prepared by: Corlis Leak  Program Notes DON'T PUSH INTO PAIN. Gentle movement to begin. Wear sling until your appointment on Friday.   Exercises - Circular Shoulder Pendulum  with Table Support  - 2 x daily - 7 x weekly - 1 sets - 10 reps - Seated Shoulder Flexion Towel Slide at Table Top  - 2 x daily - 7 x weekly - 1 sets - 10 reps - Seated Shoulder External Rotation AAROM with Cane and Hand in Neutral  - 2 x daily - 7 x weekly - 1 sets - 10 reps - Supine Shoulder Flexion with Dowel  - 2 x daily - 7 x weekly - 1 sets - 10 reps - Supine Shoulder Flexion Extension AAROM with Dowel  - 2 x daily - 7 x weekly - 1 sets - 10 reps - Supine Scapular Retraction  - 2 x daily - 7 x weekly - 1 sets - 10 reps - Seated Scapular Retraction  - 2 x daily - 7 x weekly - 1-2 sets - 10 reps - 10 sec  hold - Standing Infraspinatus/Teres Minor Release with Ball at Wall  - 2 x daily - 7 x weekly  ASSESSMENT:  CLINICAL IMPRESSION: Some increased soreness reported in L shoulder today - probably form increased activities.  Reviewed and modified exercises to focus on least discomfort for AAROM. Discussed use of noodle for improved posture with sitting and use of noodle while riding or driving as well as myofacial release work L shoulder girdle in standing. Suggested she use sling as needed over holiday while travelling and with family as a reminder to avoid overuse of L UE.  EVAL:Patient is a 67 y.o. female who was seen today for physical therapy evaluation and treatment s/p L RTC repair on 08/16/23. Due to complexity of her surgery, PT was held until 6 weeks. The patient tolerated evaluation with minimal tightness at end range (mostly with forward flexion). She does report using her L UE at times in daily tasks and c/o soreness if she attempts to lift it. PT discussed at this time she should only perform PROM in therapy and some AAROM at home (per HEP) for the shoulder--- we will progress to patient tissue tolerance. PT to address deficits to gain functional use of the L UE.   OBJECTIVE IMPAIRMENTS: decreased activity tolerance, decreased ROM, decreased strength, hypomobility, increased edema,  increased fascial restrictions, impaired flexibility, impaired UE functional use, postural dysfunction, and pain.    GOALS: Goals reviewed with patient? Yes  SHORT TERM GOALS: Target date: 10/27/23  The patient will be indep with HEP within protocol. Baseline: initiated at eval Goal status: MET   2. The patient will improve AAROM and/or PROM to 140 degrees shoulder flexion. Baseline:  see chart above. Goal status: ONOING     LONG TERM GOALS: Target date: 11/26/2023  The patient will be indep with HEP. Baseline: initiated at eval Goal status: INITIAL   2.   The patient will improve FOTO score to > or equal to 48% Baseline: 22% Goal status: INITIAL   3. The patient will demonstrate AROM to 120 degrees flexion, 45 degrees ER, and 90 degrees abduction in supine. Baseline: see chart  Goal status: INITIAL   4.  The patient  will demo improved upright posture with posterior shoulder girdle engaged to promote improved glenohumeral joint mobility. Baseline: rounded positioning Goal status: INITIAL   5.  The patient will have MMT performed when appropriate in protocol-- goal to follow. Baseline: not tested Goal status: INITIAL   PLAN:  PT FREQUENCY: 2x/week  PT DURATION: 8 weeks  PLANNED INTERVENTIONS: 97110-Therapeutic exercises, 97530- Therapeutic activity, [id number]- Neuromuscular re-education, 97535- Self Care, 16109- Manual therapy, 97014- Electrical stimulation (unattended), 97016- Vasopneumatic device, 97033- Ionotophoresis 4mg /ml Dexamethasone, Patient/Family education, Taping, Dry Needling, Joint mobilization, Scar mobilization, Cryotherapy, and Moist heat  PLAN FOR NEXT SESSION: progress PROM to tolerance, AAROM with weight of arm supported, progress per protocol recognizing that we are starting 6 weeks post surgery so will progress to tissue tolerance and with guidance from the protocol.    Val Riles, PT 10/06/2023, 4:24 PM

## 2023-10-11 ENCOUNTER — Ambulatory Visit: Payer: Medicare Other | Attending: Orthopaedic Surgery | Admitting: Rehabilitative and Restorative Service Providers"

## 2023-10-11 ENCOUNTER — Encounter: Payer: Self-pay | Admitting: Rehabilitative and Restorative Service Providers"

## 2023-10-11 DIAGNOSIS — M6281 Muscle weakness (generalized): Secondary | ICD-10-CM | POA: Insufficient documentation

## 2023-10-11 DIAGNOSIS — M25512 Pain in left shoulder: Secondary | ICD-10-CM | POA: Diagnosis not present

## 2023-10-11 NOTE — Therapy (Signed)
OUTPATIENT PHYSICAL THERAPY SHOULDER TREATMENT   Patient Name: Alexis Mcneil MRN: [id number] DOB:[birth year], 67 y.o., female Today's Date: 10/11/2023  END OF SESSION:  PT End of Session - 10/11/23 1018     Visit Number 4    Number of Visits 16    Date for PT Re-Evaluation 11/26/23    Authorization Type medicare    Progress Note Due on Visit 10    PT Start Time 1017    PT Stop Time 1102    PT Time Calculation (min) 45 min    Activity Tolerance Patient tolerated treatment well    Behavior During Therapy [facility] for tasks assessed/performed               Past Medical History:  Diagnosis Date   Anemia    Arthritis    KNEE,SHOULDERS   Cataract January 2021   Eye appt visit   Diabetes mellitus without complication (HCC)    GERD (gastroesophageal reflux disease)    Heart murmur    Hyperlipidemia    Hypertension    IFG (impaired fasting glucose)    Neuromuscular disorder (HCC)    NEUROPATHY FEET   OSA (obstructive sleep apnea)    CPAP   Sleep apnea    CPAP   Thyroid disease    Past Surgical History:  Procedure Laterality Date   COLONOSCOPY     endometrial polyp     January 2014   EYE SURGERY  1988   Cyst on eye removed   mouth palate surgery repair  1961   large tear   ROOT CANEL     2 WEEKS AGO   rt knee surgery arthroscopy  2009   torn meniscus/medial   torn ligament shoulder  1975   repaired   Patient Active Problem List   Diagnosis Date Noted   Primary osteoarthritis of right knee 12/04/2021   CKD (chronic kidney disease) stage 3, GFR 30-59 ml/min (HCC) 11/12/2021   Pre-ulcerative corn or callous 05/20/2020   Excessive sweating 05/20/2020   Gout 03/07/2018   Hereditary and idiopathic peripheral neuropathy 09/24/2015   Trochanteric bursitis of both hips 10/08/2014   Lumbar spinal stenosis 09/06/2014   Right knee pain 09/06/2014   Obstructive sleep apnea 09/26/2010   Left rotator cuff tear 03/18/2010   PARESTHESIA, HANDS 03/18/2010   Diabetes  mellitus type 2 in obese 02/07/2008   Hypothyroidism 01/06/2007   Hyperlipidemia 01/06/2007   ANEMIA NOS 01/06/2007   HYPERTENSION, BENIGN ESSENTIAL 01/06/2007    PCP: Nani Gasser, MD  REFERRING PROVIDER: Ramond Marrow, MD  REFERRING DIAG: [phone] (ICD-10-CM) - S/P arthroscopy of left shoulder Z98.890 (ICD-10-CM) - S/P left rotator cuff repair  THERAPY DIAG:  Acute pain of left shoulder  Muscle weakness (generalized)  Rationale for Evaluation and Treatment: Rehabilitation  ONSET DATE: 08/16/23  SUBJECTIVE:  SUBJECTIVE STATEMENT: The patient reports that her shoulder  gets some mild muscle soreness (she points to middle deltoid region).   EVAL: The patient reports she had an injury when she was installing shelves in her closet in late September/early August. She reports she felt a pop in her shoulder. She underwent L rotator cuff repair with SAD, distal clavicle excision, biceps tenodesis, and repair of subscapularis and supraspinatus. Due to extent of repair, she did not start physical therapy to allow tissue healing to occur. She is wearing sling until she returns to surgeon on Friday.  Hand dominance: Right  PERTINENT HISTORY: Diabetes, h/o R shoulder injury, HTN, bilat hip bursitis, hypothyroidism  PAIN:  Are you having pain? Yes: NPRS scale: 1/10 Pain location: shoulder  Pain description: soreness Aggravating factors: movement at times Relieving factors: none  PRECAUTIONS: Shoulder  WEIGHT BEARING RESTRICTIONS: follow protocol  FALLS:  Has patient fallen in last 6 months? No  PATIENT GOALS:return to full use of arm.   OBJECTIVE:  Note: Objective measures were completed at Evaluation unless otherwise noted. PATIENT SURVEYS:  FOTO 22% (goal to 68%) POSTURE: Rounded shoulders,  forward head UPPER EXTREMITY ROM:  Neck ROM 30 degrees extension, and 20 degrees of lateral SB bilat with pain and rotation to 50 degrees bilat Passive ROM Right eval Left eval Left 10/04/23 LEFT  Shoulder flexion  90  AAROM with cane 140 AAROM with cane 150  Shoulder extension      Shoulder abduction  60 in plane of scapula  80 in plane of scapula  Shoulder adduction      Shoulder internal rotation      Shoulder external rotation  38 with arm by side 40 with arm by side AAROM cane   Elbow flexion  WNLs    Elbow extension  WNLs    Wrist flexion      Wrist extension  Mild limitation 60 deg    Wrist ulnar deviation      Wrist radial deviation      Wrist pronation      Wrist supination      (Blank rows = not tested) UPPER EXTREMITY MMT: NOT TESTED MMT Right eval Left eval  Shoulder flexion    Shoulder extension    Shoulder abduction    Shoulder adduction    Shoulder internal rotation    Shoulder external rotation    Middle trapezius    Lower trapezius    Elbow flexion    Elbow extension    Wrist flexion    Wrist extension    Wrist ulnar deviation    Wrist radial deviation    Wrist pronation    Wrist supination    Grip strength (lbs)    (Blank rows = not tested) PALPATION:  Soreness in anterior pectoralis musculature, and through lateral deltoid    [facility] Adult PT Treatment:                                                DATE: 10/11/23 Therapeutic Exercise: Seated Pulleys 2-3 minutes flexion/abduction L AAROM Shoulder shrugs Standing Scapular squeeze x 12 reps with pool noodle AAROM cane L side elbow at 90 x 10 reps Supine AAROM cane AAROM cane shoulder press PROM ER at 80 degrees abduction AAROM horizontal ab/adduction  Wrist flexion/extension with arm abducted for neural gliding Elbow flexion AROM x 12 reps  Isometric abduction x 5 reps against 2 finger pressure by PT AAROM (therapist helping support arm) flexion to 90 degrees Manual Therapy: IASTM L  shoulder deltoid and biceps Sidelying scapular mobilization, diagonal patterns Modalities: Game Ready x 10 minutes , 34 degrees   [facility] Adult PT Treatment:                                                DATE: 10/06/23 Therapeutic Exercise: Standing Scap squeeze with noodle along spine 5 sec x 10  AAROM ER with cane elbow at 90 deg flexion 5 sec x 10  Supine Scapular retraction  x 10 reps Seated Scap squeeze with noodle 5-10 reps  Sitting with noodle  Gentle thoracic extension with coregeous ball 1-2 min  Manual Therapy: STM cervical; upper trap; pecs; biceps; deltoid area  PROM into L shoulder flexion; scaption; ER in scapular plane  Self care: Myofacial ball release standing 4 inch plastic ball  Modalities: Game ready 34 deg x 15 minutes  [facility] Adult PT Treatment:                                                DATE: 10/04/23 Therapeutic Exercise: Standing Physioball AAROM flexion (arm supported on ball) Physioball abduction within tolerable ROM Supine PROM  flexion, abduction, ER with arm at neutral Scapular retraction  x 10 reps AAROM cane press AAROM shoulder flexion Seated AAROM ER cane Manual Therapy: STM neck: scalenes, upper trap, suboccipital release Modalities: Game ready x 20 minutes   PATIENT EDUCATION: Education details: HEP Person educated: Patient Education method: Programmer, multimedia, Facilities manager, and Handouts Education comprehension: verbalized understanding, returned demonstration, and needs further education  HOME EXERCISE PROGRAM: Access Code: [email or web address] URL: [email or web address] Date: 10/06/2023 Prepared by: Corlis Leak  Program Notes DON'T PUSH INTO PAIN. Gentle movement to begin. Wear sling until your appointment on Friday.   Exercises - Circular Shoulder Pendulum with Table Support  - 2 x daily - 7 x weekly - 1 sets - 10 reps - Seated Shoulder Flexion Towel Slide at Table Top  - 2 x daily - 7 x weekly - 1 sets - 10 reps - Seated  Shoulder External Rotation AAROM with Cane and Hand in Neutral  - 2 x daily - 7 x weekly - 1 sets - 10 reps - Supine Shoulder Flexion with Dowel  - 2 x daily - 7 x weekly - 1 sets - 10 reps - Supine Shoulder Flexion Extension AAROM with Dowel  - 2 x daily - 7 x weekly - 1 sets - 10 reps - Supine Scapular Retraction  - 2 x daily - 7 x weekly - 1 sets - 10 reps - Seated Scapular Retraction  - 2 x daily - 7 x weekly - 1-2 sets - 10 reps - 10 sec  hold - Standing Infraspinatus/Teres Minor Release with Ball at Wall  - 2 x daily - 7 x weekly  ASSESSMENT:  CLINICAL IMPRESSION: The patient is getting mild soreness in deltoid region. We initiated gentle AAROM and isometrics (with arm at neutral) today in clinic-- did not yet add to HEP. Patient has further improved AROM into flexion and continues to make gains.   EVAL:Patient is a 67 y.o. female who  was seen today for physical therapy evaluation and treatment s/p L RTC repair on 08/16/23. Due to complexity of her surgery, PT was held until 6 weeks. The patient tolerated evaluation with minimal tightness at end range (mostly with forward flexion). She does report using her L UE at times in daily tasks and c/o soreness if she attempts to lift it. PT discussed at this time she should only perform PROM in therapy and some AAROM at home (per HEP) for the shoulder--- we will progress to patient tissue tolerance. PT to address deficits to gain functional use of the L UE.   OBJECTIVE IMPAIRMENTS: decreased activity tolerance, decreased ROM, decreased strength, hypomobility, increased edema, increased fascial restrictions, impaired flexibility, impaired UE functional use, postural dysfunction, and pain.    GOALS: Goals reviewed with patient? Yes  SHORT TERM GOALS: Target date: 10/27/23  The patient will be indep with HEP within protocol. Baseline: initiated at eval Goal status: MET   2. The patient will improve AAROM and/or PROM to 140 degrees shoulder  flexion. Baseline:  see chart above. Goal status: MET     LONG TERM GOALS: Target date: 11/26/2023  The patient will be indep with HEP. Baseline: initiated at eval Goal status: INITIAL   2.   The patient will improve FOTO score to > or equal to 48% Baseline: 22% Goal status: INITIAL   3. The patient will demonstrate AROM to 120 degrees flexion, 45 degrees ER, and 90 degrees abduction in supine. Baseline: see chart  Goal status: INITIAL   4.  The patient  will demo improved upright posture with posterior shoulder girdle engaged to promote improved glenohumeral joint mobility. Baseline: rounded positioning Goal status: INITIAL   5.  The patient will have MMT performed when appropriate in protocol-- goal to follow. Baseline: not tested Goal status: INITIAL   PLAN:  PT FREQUENCY: 2x/week  PT DURATION: 8 weeks  PLANNED INTERVENTIONS: 97110-Therapeutic exercises, 97530- Therapeutic activity, [id number]- Neuromuscular re-education, 97535- Self Care, 95638- Manual therapy, 97014- Electrical stimulation (unattended), 97016- Vasopneumatic device, 97033- Ionotophoresis 4mg /ml Dexamethasone, Patient/Family education, Taping, Dry Needling, Joint mobilization, Scar mobilization, Cryotherapy, and Moist heat  PLAN FOR NEXT SESSION: progress PROM to tolerance, AAROM with weight of arm supported, progress per protocol recognizing that we are starting 6 weeks post surgery so will progress to tissue tolerance and with guidance from the protocol.    Sweetie Giebler, PT 10/11/2023, 3:05 PM

## 2023-10-13 ENCOUNTER — Ambulatory Visit: Payer: Medicare Other

## 2023-10-13 DIAGNOSIS — M25512 Pain in left shoulder: Secondary | ICD-10-CM | POA: Diagnosis not present

## 2023-10-13 DIAGNOSIS — M6281 Muscle weakness (generalized): Secondary | ICD-10-CM | POA: Diagnosis not present

## 2023-10-13 NOTE — Therapy (Signed)
OUTPATIENT PHYSICAL THERAPY SHOULDER TREATMENT   Patient Name: Alexis Mcneil MRN: [id number] DOB:1956/01/19, 67 y.o., female Today's Date: 10/13/2023  END OF SESSION:  PT End of Session - 10/13/23 1103     Visit Number 5    Number of Visits 16    Date for PT Re-Evaluation 11/26/23    Authorization Type medicare    Progress Note Due on Visit 10    PT Start Time 1103    PT Stop Time 1155    PT Time Calculation (min) 52 min    Activity Tolerance Patient tolerated treatment well    Behavior During Therapy [facility] for tasks assessed/performed               Past Medical History:  Diagnosis Date   Anemia    Arthritis    KNEE,SHOULDERS   Cataract January 2021   Eye appt visit   Diabetes mellitus without complication (HCC)    GERD (gastroesophageal reflux disease)    Heart murmur    Hyperlipidemia    Hypertension    IFG (impaired fasting glucose)    Neuromuscular disorder (HCC)    NEUROPATHY FEET   OSA (obstructive sleep apnea)    CPAP   Sleep apnea    CPAP   Thyroid disease    Past Surgical History:  Procedure Laterality Date   COLONOSCOPY     endometrial polyp     January 2014   EYE SURGERY  1988   Cyst on eye removed   mouth palate surgery repair  1961   large tear   ROOT CANEL     2 WEEKS AGO   rt knee surgery arthroscopy  2009   torn meniscus/medial   torn ligament shoulder  1975   repaired   Patient Active Problem List   Diagnosis Date Noted   Primary osteoarthritis of right knee 12/04/2021   CKD (chronic kidney disease) stage 3, GFR 30-59 ml/min (HCC) 11/12/2021   Pre-ulcerative corn or callous 05/20/2020   Excessive sweating 05/20/2020   Gout 03/07/2018   Hereditary and idiopathic peripheral neuropathy 09/24/2015   Trochanteric bursitis of both hips 10/08/2014   Lumbar spinal stenosis 09/06/2014   Right knee pain 09/06/2014   Obstructive sleep apnea 09/26/2010   Left rotator cuff tear 03/18/2010   PARESTHESIA, HANDS 03/18/2010   Diabetes  mellitus type 2 in obese 02/07/2008   Hypothyroidism 01/06/2007   Hyperlipidemia 01/06/2007   ANEMIA NOS 01/06/2007   HYPERTENSION, BENIGN ESSENTIAL 01/06/2007    PCP: Nani Gasser, MD  REFERRING PROVIDER: Ramond Marrow, MD  REFERRING DIAG: [id number] (ICD-10-CM) - S/P arthroscopy of left shoulder Z98.890 (ICD-10-CM) - S/P left rotator cuff repair  THERAPY DIAG:  Acute pain of left shoulder  Muscle weakness (generalized)  Rationale for Evaluation and Treatment: Rehabilitation  ONSET DATE: 08/16/23  SUBJECTIVE:  SUBJECTIVE STATEMENT: Patient reports she was very sore yesterday in shoulder and woke up with numbness in pinky, although today it feels better.   EVAL: The patient reports she had an injury when she was installing shelves in her closet in late September/early August. She reports she felt a pop in her shoulder. She underwent L rotator cuff repair with SAD, distal clavicle excision, biceps tenodesis, and repair of subscapularis and supraspinatus. Due to extent of repair, she did not start physical therapy to allow tissue healing to occur. She is wearing sling until she returns to surgeon on Friday.  Hand dominance: Right  PERTINENT HISTORY: Diabetes, h/o R shoulder injury, HTN, bilat hip bursitis, hypothyroidism  PAIN:  Are you having pain? Yes: NPRS scale: 1/10 Pain location: shoulder  Pain description: soreness Aggravating factors: movement at times Relieving factors: none  PRECAUTIONS: Shoulder  WEIGHT BEARING RESTRICTIONS: follow protocol  FALLS:  Has patient fallen in last 6 months? No  PATIENT GOALS:return to full use of arm.   OBJECTIVE:  Note: Objective measures were completed at Evaluation unless otherwise noted. PATIENT SURVEYS:  FOTO 22% (goal to 68%) POSTURE: Rounded  shoulders, forward head UPPER EXTREMITY ROM:  Neck ROM 30 degrees extension, and 20 degrees of lateral SB bilat with pain and rotation to 50 degrees bilat Passive ROM Right eval Left eval Left 10/04/23 LEFT  Shoulder flexion  90  AAROM with cane 140 AAROM with cane 150  Shoulder extension      Shoulder abduction  60 in plane of scapula  80 in plane of scapula  Shoulder adduction      Shoulder internal rotation      Shoulder external rotation  38 with arm by side 40 with arm by side AAROM cane   Elbow flexion  WNLs    Elbow extension  WNLs    Wrist flexion      Wrist extension  Mild limitation 60 deg    Wrist ulnar deviation      Wrist radial deviation      Wrist pronation      Wrist supination      (Blank rows = not tested) UPPER EXTREMITY MMT: NOT TESTED MMT Right eval Left eval  Shoulder flexion    Shoulder extension    Shoulder abduction    Shoulder adduction    Shoulder internal rotation    Shoulder external rotation    Middle trapezius    Lower trapezius    Elbow flexion    Elbow extension    Wrist flexion    Wrist extension    Wrist ulnar deviation    Wrist radial deviation    Wrist pronation    Wrist supination    Grip strength (lbs)    (Blank rows = not tested) PALPATION:  Soreness in anterior pectoralis musculature, and through lateral deltoid    [facility] Adult PT Treatment:                                                DATE: 10/13/2023 Therapeutic Exercise: Standing with noodle --> scap squeezes 12x5" Pulleys --> AAROM flexion/abduction L AAROM x 3 min each Shoulder shrugs x20 Standing against noodle --> L AAROM shoulder ER 10x10" Supine: AAROM cane shoulder press PROM ER at 80 degrees abduction AAROM horizontal ab/adduction  Wrist flexion/extension with arm abducted for neural gliding Elbow flexion AROM x  12 reps Isometric abduction x 5 reps against 2 finger pressure by PT Modalities: Vaso (L) shoulder, 34 degrees, med compression x 10  min    [facility] Adult PT Treatment:                                                DATE: 10/11/23 Therapeutic Exercise: Seated Pulleys 2-3 minutes flexion/abduction L AAROM Shoulder shrugs Standing Scapular squeeze x 12 reps with pool noodle AAROM cane L side elbow at 90 x 10 reps Supine AAROM cane AAROM cane shoulder press PROM ER at 80 degrees abduction AAROM horizontal ab/adduction  Wrist flexion/extension with arm abducted for neural gliding Elbow flexion AROM x 12 reps Isometric abduction x 5 reps against 2 finger pressure by PT AAROM (therapist helping support arm) flexion to 90 degrees Manual Therapy: IASTM L shoulder deltoid and biceps Sidelying scapular mobilization, diagonal patterns Modalities: Game Ready x 10 minutes , 34 degrees   [facility] Adult PT Treatment:                                                DATE: 10/06/23 Therapeutic Exercise: Standing Scap squeeze with noodle along spine 5 sec x 10  AAROM ER with cane elbow at 90 deg flexion 5 sec x 10  Supine Scapular retraction  x 10 reps Seated Scap squeeze with noodle 5-10 reps  Sitting with noodle  Gentle thoracic extension with coregeous ball 1-2 min  Manual Therapy: STM cervical; upper trap; pecs; biceps; deltoid area  PROM into L shoulder flexion; scaption; ER in scapular plane  Self care: Myofacial ball release standing 4 inch plastic ball  Modalities: Game ready 34 deg x 15 minutes   PATIENT EDUCATION: Education details: HEP Person educated: Patient Education method: Programmer, multimedia, Facilities manager, and Handouts Education comprehension: verbalized understanding, returned demonstration, and needs further education  HOME EXERCISE PROGRAM: Access Code: [email or web address] URL: [email or web address] Date: 10/06/2023 Prepared by: Corlis Leak  Program Notes DON'T PUSH INTO PAIN. Gentle movement to begin. Wear sling until your appointment on Friday.   Exercises - Circular Shoulder Pendulum with Table  Support  - 2 x daily - 7 x weekly - 1 sets - 10 reps - Seated Shoulder Flexion Towel Slide at Table Top  - 2 x daily - 7 x weekly - 1 sets - 10 reps - Seated Shoulder External Rotation AAROM with Cane and Hand in Neutral  - 2 x daily - 7 x weekly - 1 sets - 10 reps - Supine Shoulder Flexion with Dowel  - 2 x daily - 7 x weekly - 1 sets - 10 reps - Supine Shoulder Flexion Extension AAROM with Dowel  - 2 x daily - 7 x weekly - 1 sets - 10 reps - Supine Scapular Retraction  - 2 x daily - 7 x weekly - 1 sets - 10 reps - Seated Scapular Retraction  - 2 x daily - 7 x weekly - 1-2 sets - 10 reps - 10 sec  hold - Standing Infraspinatus/Teres Minor Release with Ball at Wall  - 2 x daily - 7 x weekly  ASSESSMENT:  CLINICAL IMPRESSION:  Shoulder active assist mobility exercises continued per protocol. Light isometric abduction continued with no exacerbation of pain.  Cueing required to decrease muscle guarding and promote relaxation during shoulder PROM.  EVAL:Patient is a 67 y.o. female who was seen today for physical therapy evaluation and treatment s/p L RTC repair on 08/16/23. Due to complexity of her surgery, PT was held until 6 weeks. The patient tolerated evaluation with minimal tightness at end range (mostly with forward flexion). She does report using her L UE at times in daily tasks and c/o soreness if she attempts to lift it. PT discussed at this time she should only perform PROM in therapy and some AAROM at home (per HEP) for the shoulder--- we will progress to patient tissue tolerance. PT to address deficits to gain functional use of the L UE.   OBJECTIVE IMPAIRMENTS: decreased activity tolerance, decreased ROM, decreased strength, hypomobility, increased edema, increased fascial restrictions, impaired flexibility, impaired UE functional use, postural dysfunction, and pain.    GOALS: Goals reviewed with patient? Yes  SHORT TERM GOALS: Target date: 10/27/23  The patient will be indep with HEP  within protocol. Baseline: initiated at eval Goal status: MET   2. The patient will improve AAROM and/or PROM to 140 degrees shoulder flexion. Baseline:  see chart above. Goal status: MET     LONG TERM GOALS: Target date: 11/26/2023  The patient will be indep with HEP. Baseline: initiated at eval Goal status: INITIAL   2.   The patient will improve FOTO score to > or equal to 48% Baseline: 22% Goal status: INITIAL   3. The patient will demonstrate AROM to 120 degrees flexion, 45 degrees ER, and 90 degrees abduction in supine. Baseline: see chart  Goal status: INITIAL   4.  The patient  will demo improved upright posture with posterior shoulder girdle engaged to promote improved glenohumeral joint mobility. Baseline: rounded positioning Goal status: INITIAL   5.  The patient will have MMT performed when appropriate in protocol-- goal to follow. Baseline: not tested Goal status: INITIAL   PLAN:  PT FREQUENCY: 2x/week  PT DURATION: 8 weeks  PLANNED INTERVENTIONS: 97110-Therapeutic exercises, 97530- Therapeutic activity, [id number]- Neuromuscular re-education, 97535- Self Care, 47829- Manual therapy, 97014- Electrical stimulation (unattended), 97016- Vasopneumatic device, 97033- Ionotophoresis 4mg /ml Dexamethasone, Patient/Family education, Taping, Dry Needling, Joint mobilization, Scar mobilization, Cryotherapy, and Moist heat  PLAN FOR NEXT SESSION: Progress PROM to tolerance, AAROM with weight of arm supported, progress per protocol recognizing that we are starting 6 weeks post surgery so will progress to tissue tolerance and with guidance from the protocol.    Sanjuana Mae, PTA 10/13/2023, 11:48 AM

## 2023-10-17 ENCOUNTER — Encounter: Payer: Self-pay | Admitting: Family Medicine

## 2023-10-18 ENCOUNTER — Encounter: Payer: Self-pay | Admitting: Physical Therapy

## 2023-10-18 ENCOUNTER — Ambulatory Visit
Admission: RE | Admit: 2023-10-18 | Discharge: 2023-10-18 | Disposition: A | Discharge status: Home / Self Care | Payer: Medicare Other | Source: Ambulatory Visit | Attending: Family Medicine | Admitting: Family Medicine

## 2023-10-18 ENCOUNTER — Ambulatory Visit: Payer: Medicare Other | Admitting: Physical Therapy

## 2023-10-18 DIAGNOSIS — M6281 Muscle weakness (generalized): Secondary | ICD-10-CM

## 2023-10-18 DIAGNOSIS — M25512 Pain in left shoulder: Secondary | ICD-10-CM

## 2023-10-18 DIAGNOSIS — Z1231 Encounter for screening mammogram for malignant neoplasm of breast: Secondary | ICD-10-CM

## 2023-10-18 NOTE — Therapy (Signed)
OUTPATIENT PHYSICAL THERAPY SHOULDER TREATMENT   Patient Name: KELLYANNE FREUDENTHAL MRN: [id number] DOB:12-Jun-1956, 67 y.o., female Today's Date: 10/18/2023  END OF SESSION:  PT End of Session - 10/18/23 1059     Visit Number 6    Number of Visits 16    Date for PT Re-Evaluation 11/26/23    Authorization Type medicare    Authorization - Visit Number 6    Progress Note Due on Visit 10    PT Start Time 1025    PT Stop Time 1110    PT Time Calculation (min) 45 min    Activity Tolerance Patient tolerated treatment well    Behavior During Therapy [facility] for tasks assessed/performed                Past Medical History:  Diagnosis Date   Anemia    Arthritis    KNEE,SHOULDERS   Cataract January 2021   Eye appt visit   Diabetes mellitus without complication (HCC)    GERD (gastroesophageal reflux disease)    Heart murmur    Hyperlipidemia    Hypertension    IFG (impaired fasting glucose)    Neuromuscular disorder (HCC)    NEUROPATHY FEET   OSA (obstructive sleep apnea)    CPAP   Sleep apnea    CPAP   Thyroid disease    Past Surgical History:  Procedure Laterality Date   COLONOSCOPY     endometrial polyp     January 2014   EYE SURGERY  1988   Cyst on eye removed   mouth palate surgery repair  1961   large tear   ROOT CANEL     2 WEEKS AGO   rt knee surgery arthroscopy  2009   torn meniscus/medial   torn ligament shoulder  1975   repaired   Patient Active Problem List   Diagnosis Date Noted   Primary osteoarthritis of right knee 12/04/2021   CKD (chronic kidney disease) stage 3, GFR 30-59 ml/min (HCC) 11/12/2021   Pre-ulcerative corn or callous 05/20/2020   Excessive sweating 05/20/2020   Gout 03/07/2018   Hereditary and idiopathic peripheral neuropathy 09/24/2015   Trochanteric bursitis of both hips 10/08/2014   Lumbar spinal stenosis 09/06/2014   Right knee pain 09/06/2014   Obstructive sleep apnea 09/26/2010   Left rotator cuff tear 03/18/2010    PARESTHESIA, HANDS 03/18/2010   Diabetes mellitus type 2 in obese 02/07/2008   Hypothyroidism 01/06/2007   Hyperlipidemia 01/06/2007   ANEMIA NOS 01/06/2007   HYPERTENSION, BENIGN ESSENTIAL 01/06/2007    PCP: Nani Gasser, MD  REFERRING PROVIDER: Ramond Marrow, MD  REFERRING DIAG: [phone] (ICD-10-CM) - S/P arthroscopy of left shoulder Z98.890 (ICD-10-CM) - S/P left rotator cuff repair  THERAPY DIAG:  Acute pain of left shoulder  Muscle weakness (generalized)  Rationale for Evaluation and Treatment: Rehabilitation  ONSET DATE: 08/16/23  SUBJECTIVE:  SUBJECTIVE STATEMENT: Patient reports she has pain when she bends to put down cat food, states overall she is feeling better  EVAL: The patient reports she had an injury when she was installing shelves in her closet in late September/early August. She reports she felt a pop in her shoulder. She underwent L rotator cuff repair with SAD, distal clavicle excision, biceps tenodesis, and repair of subscapularis and supraspinatus. Due to extent of repair, she did not start physical therapy to allow tissue healing to occur. She is wearing sling until she returns to surgeon on Friday.  Hand dominance: Right  PERTINENT HISTORY: Diabetes, h/o R shoulder injury, HTN, bilat hip bursitis, hypothyroidism  PAIN:  Are you having pain? Yes: NPRS scale: 1/10 Pain location: shoulder  Pain description: soreness Aggravating factors: movement at times Relieving factors: none  PRECAUTIONS: Shoulder  WEIGHT BEARING RESTRICTIONS: follow protocol  FALLS:  Has patient fallen in last 6 months? No  PATIENT GOALS:return to full use of arm.   OBJECTIVE:  Note: Objective measures were completed at Evaluation unless otherwise noted. PATIENT SURVEYS:  FOTO 22% (goal to  68%) POSTURE: Rounded shoulders, forward head UPPER EXTREMITY ROM:  Neck ROM 30 degrees extension, and 20 degrees of lateral SB bilat with pain and rotation to 50 degrees bilat Passive ROM Right eval Left eval Left 10/04/23 LEFT  Shoulder flexion  90  AAROM with cane 140 AAROM with cane 150  Shoulder extension      Shoulder abduction  60 in plane of scapula  80 in plane of scapula  Shoulder adduction      Shoulder internal rotation      Shoulder external rotation  38 with arm by side 40 with arm by side AAROM cane   Elbow flexion  WNLs    Elbow extension  WNLs    Wrist flexion      Wrist extension  Mild limitation 60 deg    Wrist ulnar deviation      Wrist radial deviation      Wrist pronation      Wrist supination      (Blank rows = not tested) UPPER EXTREMITY MMT: NOT TESTED MMT Right eval Left eval  Shoulder flexion    Shoulder extension    Shoulder abduction    Shoulder adduction    Shoulder internal rotation    Shoulder external rotation    Middle trapezius    Lower trapezius    Elbow flexion    Elbow extension    Wrist flexion    Wrist extension    Wrist ulnar deviation    Wrist radial deviation    Wrist pronation    Wrist supination    Grip strength (lbs)    (Blank rows = not tested) PALPATION:  Soreness in anterior pectoralis musculature, and through lateral deltoid    [facility] Adult PT Treatment:                                                DATE: 10/18/23 Therapeutic Exercise: Pulley flexion, abduction x 2 min each Standing with noodle behind back: scap squeeze 10 x 5 sec, ER AAROM x 10 Standing abd AAROM with dowel x 10 Supine AAROM flexion with dowel AAROM horizontal abd/add Elbow flex/ext x 12 Isometric abduction x 5 reps against 2 finger pressure by PT PROM ER at 80 degrees abduction  Modalities: Vaso (L) shoulder, 34 degrees, med compression x 10 min   [facility] Adult PT Treatment:                                                DATE:  10/13/2023 Therapeutic Exercise: Standing with noodle --> scap squeezes 12x5" Pulleys --> AAROM flexion/abduction L AAROM x 3 min each Shoulder shrugs x20 Standing against noodle --> L AAROM shoulder ER 10x10" Supine: AAROM cane shoulder press PROM ER at 80 degrees abduction AAROM horizontal ab/adduction  Wrist flexion/extension with arm abducted for neural gliding Elbow flexion AROM x 12 reps Isometric abduction x 5 reps against 2 finger pressure by PT Modalities: Vaso (L) shoulder, 34 degrees, med compression x 10 min    [facility] Adult PT Treatment:                                                DATE: 10/11/23 Therapeutic Exercise: Seated Pulleys 2-3 minutes flexion/abduction L AAROM Shoulder shrugs Standing Scapular squeeze x 12 reps with pool noodle AAROM cane L side elbow at 90 x 10 reps Supine AAROM cane AAROM cane shoulder press PROM ER at 80 degrees abduction AAROM horizontal ab/adduction  Wrist flexion/extension with arm abducted for neural gliding Elbow flexion AROM x 12 reps Isometric abduction x 5 reps against 2 finger pressure by PT AAROM (therapist helping support arm) flexion to 90 degrees Manual Therapy: IASTM L shoulder deltoid and biceps Sidelying scapular mobilization, diagonal patterns Modalities: Game Ready x 10 minutes , 34 degrees    PATIENT EDUCATION: Education details: HEP Person educated: Patient Education method: Programmer, multimedia, Facilities manager, and Handouts Education comprehension: verbalized understanding, returned demonstration, and needs further education  HOME EXERCISE PROGRAM: Access Code: [email or web address] URL: [email or web address] Date: 10/06/2023 Prepared by: Corlis Leak  Program Notes DON'T PUSH INTO PAIN. Gentle movement to begin. Wear sling until your appointment on Friday.   Exercises - Circular Shoulder Pendulum with Table Support  - 2 x daily - 7 x weekly - 1 sets - 10 reps - Seated Shoulder Flexion Towel Slide at Table  Top  - 2 x daily - 7 x weekly - 1 sets - 10 reps - Seated Shoulder External Rotation AAROM with Cane and Hand in Neutral  - 2 x daily - 7 x weekly - 1 sets - 10 reps - Supine Shoulder Flexion with Dowel  - 2 x daily - 7 x weekly - 1 sets - 10 reps - Supine Shoulder Flexion Extension AAROM with Dowel  - 2 x daily - 7 x weekly - 1 sets - 10 reps - Supine Scapular Retraction  - 2 x daily - 7 x weekly - 1 sets - 10 reps - Seated Scapular Retraction  - 2 x daily - 7 x weekly - 1-2 sets - 10 reps - 10 sec  hold - Standing Infraspinatus/Teres Minor Release with Ball at Wall  - 2 x daily - 7 x weekly  ASSESSMENT:  CLINICAL IMPRESSION:  Pt continues to progress well per protocol. She is improving tolerance to AAROM and lightly resisted abduction  EVAL:Patient is a 66 y.o. female who was seen today for physical therapy evaluation and treatment s/p L RTC repair on 08/16/23. Due to complexity of  her surgery, PT was held until 6 weeks. The patient tolerated evaluation with minimal tightness at end range (mostly with forward flexion). She does report using her L UE at times in daily tasks and c/o soreness if she attempts to lift it. PT discussed at this time she should only perform PROM in therapy and some AAROM at home (per HEP) for the shoulder--- we will progress to patient tissue tolerance. PT to address deficits to gain functional use of the L UE.   OBJECTIVE IMPAIRMENTS: decreased activity tolerance, decreased ROM, decreased strength, hypomobility, increased edema, increased fascial restrictions, impaired flexibility, impaired UE functional use, postural dysfunction, and pain.    GOALS: Goals reviewed with patient? Yes  SHORT TERM GOALS: Target date: 10/27/23  The patient will be indep with HEP within protocol. Baseline: initiated at eval Goal status: MET   2. The patient will improve AAROM and/or PROM to 140 degrees shoulder flexion. Baseline:  see chart above. Goal status: MET     LONG TERM  GOALS: Target date: 11/26/2023  The patient will be indep with HEP. Baseline: initiated at eval Goal status: INITIAL   2.   The patient will improve FOTO score to > or equal to 48% Baseline: 22% Goal status: INITIAL   3. The patient will demonstrate AROM to 120 degrees flexion, 45 degrees ER, and 90 degrees abduction in supine. Baseline: see chart  Goal status: INITIAL   4.  The patient  will demo improved upright posture with posterior shoulder girdle engaged to promote improved glenohumeral joint mobility. Baseline: rounded positioning Goal status: INITIAL   5.  The patient will have MMT performed when appropriate in protocol-- goal to follow. Baseline: not tested Goal status: INITIAL   PLAN:  PT FREQUENCY: 2x/week  PT DURATION: 8 weeks  PLANNED INTERVENTIONS: 97110-Therapeutic exercises, 97530- Therapeutic activity, [id number]- Neuromuscular re-education, 97535- Self Care, 29562- Manual therapy, 97014- Electrical stimulation (unattended), 97016- Vasopneumatic device, 97033- Ionotophoresis 4mg /ml Dexamethasone, Patient/Family education, Taping, Dry Needling, Joint mobilization, Scar mobilization, Cryotherapy, and Moist heat  PLAN FOR NEXT SESSION: Progress PROM to tolerance, AAROM with weight of arm supported, progress per protocol recognizing that we are starting 6 weeks post surgery so will progress to tissue tolerance and with guidance from the protocol.    Destinie Thornsberry, PT 10/18/2023, 11:00 AM

## 2023-10-20 NOTE — Progress Notes (Signed)
Please call patient. Normal mammogram.  Repeat in 1 year.  

## 2023-10-21 ENCOUNTER — Encounter: Payer: Medicare Other | Admitting: Rehabilitative and Restorative Service Providers"

## 2023-10-21 ENCOUNTER — Other Ambulatory Visit: Payer: Self-pay | Admitting: Family Medicine

## 2023-10-21 DIAGNOSIS — G8929 Other chronic pain: Secondary | ICD-10-CM

## 2023-10-24 NOTE — Therapy (Signed)
OUTPATIENT PHYSICAL THERAPY SHOULDER TREATMENT   Patient Name: Alexis Mcneil MRN: [id number] DOB:08/19/1956, 67 y.o., female Today's Date: 10/25/2023  END OF SESSION:  PT End of Session - 10/25/23 1106     Visit Number 7    Number of Visits 16    Date for PT Re-Evaluation 11/26/23    Authorization Type medicare    Authorization - Visit Number 7    Progress Note Due on Visit 10    PT Start Time 1105    PT Stop Time 1153    PT Time Calculation (min) 48 min    Activity Tolerance Patient tolerated treatment well    Behavior During Therapy [facility] for tasks assessed/performed                 Past Medical History:  Diagnosis Date   Anemia    Arthritis    KNEE,SHOULDERS   Cataract January 2021   Eye appt visit   Diabetes mellitus without complication (HCC)    GERD (gastroesophageal reflux disease)    Heart murmur    Hyperlipidemia    Hypertension    IFG (impaired fasting glucose)    Neuromuscular disorder (HCC)    NEUROPATHY FEET   OSA (obstructive sleep apnea)    CPAP   Sleep apnea    CPAP   Thyroid disease    Past Surgical History:  Procedure Laterality Date   COLONOSCOPY     endometrial polyp     January 2014   EYE SURGERY  1988   Cyst on eye removed   mouth palate surgery repair  1961   large tear   ROOT CANEL     2 WEEKS AGO   rt knee surgery arthroscopy  2009   torn meniscus/medial   torn ligament shoulder  1975   repaired   Patient Active Problem List   Diagnosis Date Noted   Primary osteoarthritis of right knee 12/04/2021   CKD (chronic kidney disease) stage 3, GFR 30-59 ml/min (HCC) 11/12/2021   Pre-ulcerative corn or callous 05/20/2020   Excessive sweating 05/20/2020   Gout 03/07/2018   Hereditary and idiopathic peripheral neuropathy 09/24/2015   Trochanteric bursitis of both hips 10/08/2014   Lumbar spinal stenosis 09/06/2014   Right knee pain 09/06/2014   Obstructive sleep apnea 09/26/2010   Left rotator cuff tear 03/18/2010    PARESTHESIA, HANDS 03/18/2010   Diabetes mellitus type 2 in obese 02/07/2008   Hypothyroidism 01/06/2007   Hyperlipidemia 01/06/2007   ANEMIA NOS 01/06/2007   HYPERTENSION, BENIGN ESSENTIAL 01/06/2007    PCP: Nani Gasser, MD  REFERRING PROVIDER: Ramond Marrow, MD  REFERRING DIAG: [phone] (ICD-10-CM) - S/P arthroscopy of left shoulder Z98.890 (ICD-10-CM) - S/P left rotator cuff repair  THERAPY DIAG:  Acute pain of left shoulder  Muscle weakness (generalized)  Rationale for Evaluation and Treatment: Rehabilitation  ONSET DATE: 08/16/23  SUBJECTIVE:  SUBJECTIVE STATEMENT: Sore today. Just reaching for my glasses caught my shoulder.  EVAL: The patient reports she had an injury when she was installing shelves in her closet in late September/early August. She reports she felt a pop in her shoulder. She underwent L rotator cuff repair with SAD, distal clavicle excision, biceps tenodesis, and repair of subscapularis and supraspinatus. Due to extent of repair, she did not start physical therapy to allow tissue healing to occur. She is wearing sling until she returns to surgeon on Friday.  Hand dominance: Right  PERTINENT HISTORY: Diabetes, h/o R shoulder injury, HTN, bilat hip bursitis, hypothyroidism  PAIN:  Are you having pain? Yes: NPRS scale: 1/10 Pain location: shoulder  Pain description: soreness Aggravating factors: movement at times Relieving factors: none  PRECAUTIONS: Shoulder  WEIGHT BEARING RESTRICTIONS: follow protocol  FALLS:  Has patient fallen in last 6 months? No  PATIENT GOALS:return to full use of arm.   OBJECTIVE:  Note: Objective measures were completed at Evaluation unless otherwise noted. PATIENT SURVEYS:  FOTO 22% (goal to 68%) POSTURE: Rounded shoulders, forward  head UPPER EXTREMITY ROM:  Neck ROM 30 degrees extension, and 20 degrees of lateral SB bilat with pain and rotation to 50 degrees bilat Passive ROM Right eval Left eval Left 10/04/23 LEFT Left 12/16  Shoulder flexion  90  AAROM with cane 140 AAROM with cane 150 AAROM with cane 145  Shoulder extension       Shoulder abduction  60 in plane of scapula  80 in plane of scapula 60 in plane of scapula  Shoulder adduction       Shoulder internal rotation       Shoulder external rotation  38 with arm by side 40 with arm by side AAROM cane    Elbow flexion  WNLs     Elbow extension  WNLs     Wrist flexion       Wrist extension  Mild limitation 60 deg     Wrist ulnar deviation       Wrist radial deviation       Wrist pronation       Wrist supination       (Blank rows = not tested) UPPER EXTREMITY MMT: NOT TESTED MMT Right eval Left eval  Shoulder flexion    Shoulder extension    Shoulder abduction    Shoulder adduction    Shoulder internal rotation    Shoulder external rotation    Middle trapezius    Lower trapezius    Elbow flexion    Elbow extension    Wrist flexion    Wrist extension    Wrist ulnar deviation    Wrist radial deviation    Wrist pronation    Wrist supination    Grip strength (lbs)    (Blank rows = not tested) PALPATION:  Soreness in anterior pectoralis musculature, and through lateral deltoid     [facility] Adult PT Treatment:                                                DATE: 10/25/23 Therapeutic Exercise: Pulley flexion, abduction x 2 min each Isometrics: Seated ball press in lap for pecs, ext into ball for lats, deltoids 10 x 5 sec hold Standing with noodle behind back: scap squeeze 10 x 5 sec, ER AAROM x 10 Standing abd AAROM  with dowel x 15 Supine AAROM flexion with dowel AAROM horizontal abd/add Elbow flex/ext x 12 PROM ER at 80 degrees abduction Manual: STM in sidelying to L UT and paraspinals to decrease muscle tension and  soreness  Modalities: Vaso (L) shoulder, 34 degrees, med compression x 10 min  [facility] Adult PT Treatment:                                                DATE: 10/18/23 Therapeutic Exercise: Pulley flexion, abduction x 2 min each Standing with noodle behind back: scap squeeze 10 x 5 sec, ER AAROM x 10 Standing abd AAROM with dowel x 10 Supine AAROM flexion with dowel AAROM horizontal abd/add Elbow flex/ext x 12 Isometric abduction x 5 reps against 2 finger pressure by PT PROM ER at 80 degrees abduction  Modalities: Vaso (L) shoulder, 34 degrees, med compression x 10 min   [facility] Adult PT Treatment:                                                DATE: 10/13/2023 Therapeutic Exercise: Standing with noodle --> scap squeezes 12x5" Pulleys --> AAROM flexion/abduction L AAROM x 3 min each Shoulder shrugs x20 Standing against noodle --> L AAROM shoulder ER 10x10" Supine: AAROM cane shoulder press PROM ER at 80 degrees abduction AAROM horizontal ab/adduction  Wrist flexion/extension with arm abducted for neural gliding Elbow flexion AROM x 12 reps Isometric abduction x 5 reps against 2 finger pressure by PT Modalities: Vaso (L) shoulder, 34 degrees, med compression x 10 min    [facility] Adult PT Treatment:                                                DATE: 10/11/23 Therapeutic Exercise: Seated Pulleys 2-3 minutes flexion/abduction L AAROM Shoulder shrugs Standing Scapular squeeze x 12 reps with pool noodle AAROM cane L side elbow at 90 x 10 reps Supine AAROM cane AAROM cane shoulder press PROM ER at 80 degrees abduction AAROM horizontal ab/adduction  Wrist flexion/extension with arm abducted for neural gliding Elbow flexion AROM x 12 reps Isometric abduction x 5 reps against 2 finger pressure by PT AAROM (therapist helping support arm) flexion to 90 degrees Manual Therapy: IASTM L shoulder deltoid and biceps Sidelying scapular mobilization, diagonal patterns Modalities: Game  Ready x 10 minutes , 34 degrees    PATIENT EDUCATION: Education details: HEP Person educated: Patient Education method: Programmer, multimedia, Facilities manager, and Handouts Education comprehension: verbalized understanding, returned demonstration, and needs further education  HOME EXERCISE PROGRAM: Access Code: [email or web address] URL: [email or web address] Date: 10/06/2023 Prepared by: Corlis Leak  Program Notes DON'T PUSH INTO PAIN. Gentle movement to begin. Wear sling until your appointment on Friday.   Exercises - Circular Shoulder Pendulum with Table Support  - 2 x daily - 7 x weekly - 1 sets - 10 reps - Seated Shoulder Flexion Towel Slide at Table Top  - 2 x daily - 7 x weekly - 1 sets - 10 reps - Seated Shoulder External Rotation AAROM with Cane and Hand in Neutral  - 2 x daily -  7 x weekly - 1 sets - 10 reps - Supine Shoulder Flexion with Dowel  - 2 x daily - 7 x weekly - 1 sets - 10 reps - Supine Shoulder Flexion Extension AAROM with Dowel  - 2 x daily - 7 x weekly - 1 sets - 10 reps - Supine Scapular Retraction  - 2 x daily - 7 x weekly - 1 sets - 10 reps - Seated Scapular Retraction  - 2 x daily - 7 x weekly - 1-2 sets - 10 reps - 10 sec  hold - Standing Infraspinatus/Teres Minor Release with Ball at Wall  - 2 x daily - 7 x weekly  ASSESSMENT:  CLINICAL IMPRESSION:  Patient reporting just soreness today. We progressed with some gentle isometrics without complaint, but pt does require cues not to be too aggressive. Her measurements were a little less today than previous. She reports she is using the shoulder functionally to wash her hair, although she cannot get it fully up overhead. Her UT is very tight. She responded well to MT, and may benefit from DN in the future here. Pt continues to demonstrate potential for improvement and would benefit from continued skilled therapy to address impairments.    EVAL:Patient is a 67 y.o. female who was seen today for physical therapy  evaluation and treatment s/p L RTC repair on 08/16/23. Due to complexity of her surgery, PT was held until 6 weeks. The patient tolerated evaluation with minimal tightness at end range (mostly with forward flexion). She does report using her L UE at times in daily tasks and c/o soreness if she attempts to lift it. PT discussed at this time she should only perform PROM in therapy and some AAROM at home (per HEP) for the shoulder--- we will progress to patient tissue tolerance. PT to address deficits to gain functional use of the L UE.   OBJECTIVE IMPAIRMENTS: decreased activity tolerance, decreased ROM, decreased strength, hypomobility, increased edema, increased fascial restrictions, impaired flexibility, impaired UE functional use, postural dysfunction, and pain.    GOALS: Goals reviewed with patient? Yes  SHORT TERM GOALS: Target date: 10/27/23  The patient will be indep with HEP within protocol. Baseline: initiated at eval Goal status: MET   2. The patient will improve AAROM and/or PROM to 140 degrees shoulder flexion. Baseline:  see chart above. Goal status: MET     LONG TERM GOALS: Target date: 11/26/2023  The patient will be indep with HEP. Baseline: initiated at eval Goal status: INITIAL   2.   The patient will improve FOTO score to > or equal to 48% Baseline: 22% Goal status: INITIAL   3. The patient will demonstrate AROM to 120 degrees flexion, 45 degrees ER, and 90 degrees abduction in supine. Baseline: see chart  Goal status: INITIAL   4.  The patient  will demo improved upright posture with posterior shoulder girdle engaged to promote improved glenohumeral joint mobility. Baseline: rounded positioning Goal status: INITIAL   5.  The patient will have MMT performed when appropriate in protocol-- goal to follow. Baseline: not tested Goal status: INITIAL   PLAN:  PT FREQUENCY: 2x/week  PT DURATION: 8 weeks  PLANNED INTERVENTIONS: 97110-Therapeutic exercises,  97530- Therapeutic activity, 97112- Neuromuscular re-education, 97535- Self Care, 32440- Manual therapy, 97014- Electrical stimulation (unattended), 97016- Vasopneumatic device, 97033- Ionotophoresis 4mg /ml Dexamethasone, Patient/Family education, Taping, Dry Needling, Joint mobilization, Scar mobilization, Cryotherapy, and Moist heat  PLAN FOR NEXT SESSION: Progress PROM to tolerance, AAROM with weight of arm  supported, progress per protocol recognizing that we are starting 6 weeks post surgery so will progress to tissue tolerance and with guidance from the protocol.    Solon Palm, PT  10/25/2023, 11:48 AM

## 2023-10-25 ENCOUNTER — Encounter: Payer: Self-pay | Admitting: Physical Therapy

## 2023-10-25 ENCOUNTER — Ambulatory Visit: Payer: Medicare Other | Admitting: Physical Therapy

## 2023-10-25 DIAGNOSIS — M25512 Pain in left shoulder: Secondary | ICD-10-CM

## 2023-10-25 DIAGNOSIS — M6281 Muscle weakness (generalized): Secondary | ICD-10-CM | POA: Diagnosis not present

## 2023-10-26 ENCOUNTER — Encounter: Payer: Self-pay | Admitting: Emergency Medicine

## 2023-10-26 ENCOUNTER — Ambulatory Visit
Admission: EM | Admit: 2023-10-26 | Discharge: 2023-10-26 | Disposition: A | Discharge status: Home / Self Care | Payer: Medicare Other | Attending: Family Medicine | Admitting: Family Medicine

## 2023-10-26 DIAGNOSIS — S91212A Laceration without foreign body of left great toe with damage to nail, initial encounter: Secondary | ICD-10-CM

## 2023-10-26 DIAGNOSIS — S91209A Unspecified open wound of unspecified toe(s) with damage to nail, initial encounter: Secondary | ICD-10-CM

## 2023-10-26 MED ORDER — CEPHALEXIN 500 MG PO CAPS: 500.0000 mg | ORAL_CAPSULE | Freq: Two times a day (BID) | ORAL | 0 refills | Status: DC

## 2023-10-26 NOTE — ED Provider Notes (Signed)
[facility]    CSN: [id number] Arrival date & time: 10/26/23  1428      History   Chief Complaint Chief Complaint  Patient presents with   Toe Injury    HPI Alexis Mcneil is a 67 y.o. female.   Patient states that she was walking through her garage and socks and a piece of plywood fell across her big toe on her left foot.  She states she has significant neuropathy.  She did not feel much pain, however noticed that her sock was turning red.  After removing the socks she realized that she had injured her toe she is here for evaluation.  She has concerns because she has diabetes, neuropathy, and since she does not feel pain And she is concerned about developing infection.    Past Medical History:  Diagnosis Date   Anemia    Arthritis    KNEE,SHOULDERS   Cataract January 2021   Eye appt visit   Diabetes mellitus without complication (HCC)    GERD (gastroesophageal reflux disease)    Heart murmur    Hyperlipidemia    Hypertension    IFG (impaired fasting glucose)    Neuromuscular disorder (HCC)    NEUROPATHY FEET   OSA (obstructive sleep apnea)    CPAP   Sleep apnea    CPAP   Thyroid disease     Patient Active Problem List   Diagnosis Date Noted   Primary osteoarthritis of right knee 12/04/2021   CKD (chronic kidney disease) stage 3, GFR 30-59 ml/min (HCC) 11/12/2021   Pre-ulcerative corn or callous 05/20/2020   Excessive sweating 05/20/2020   Gout 03/07/2018   Hereditary and idiopathic peripheral neuropathy 09/24/2015   Trochanteric bursitis of both hips 10/08/2014   Lumbar spinal stenosis 09/06/2014   Right knee pain 09/06/2014   Obstructive sleep apnea 09/26/2010   Left rotator cuff tear 03/18/2010   PARESTHESIA, HANDS 03/18/2010   Diabetes mellitus type 2 in obese 02/07/2008   Hypothyroidism 01/06/2007   Hyperlipidemia 01/06/2007   ANEMIA NOS 01/06/2007   HYPERTENSION, BENIGN ESSENTIAL 01/06/2007    Past Surgical History:  Procedure  Laterality Date   COLONOSCOPY     endometrial polyp     January 2014   EYE SURGERY  1988   Cyst on eye removed   mouth palate surgery repair  1961   large tear   ROOT CANEL     2 WEEKS AGO   rt knee surgery arthroscopy  2009   torn meniscus/medial   torn ligament shoulder  1975   repaired    OB History   No obstetric history on file.      Home Medications    Prior to Admission medications   Medication Sig Start Date End Date Taking? Authorizing Provider  Acetaminophen Extra Strength 500 MG TABS Take 2 tablets (1,000 mg total) by mouth every 8 (eight) hours as needed. 08/16/23  Yes   allopurinol (ZYLOPRIM) 300 MG tablet TAKE ONE-HALF TABLET BY MOUTH  DAILY 10/22/23  Yes Agapito Games, MD  AMBULATORY NON FORMULARY MEDICATION Medication Name: CPAP machine and supplies with humidifier.  Please set her CPAP to 9 cm water pressure. Then download after one week so can review her apneas on 9 cm water pressure.  [facility]. Dx. OSA G47.33 12/08/16  Yes Agapito Games, MD  AMBULATORY NON FORMULARY MEDICATION Change auotpap and then do a download in 2 weeks. 02/23/23  Yes Agapito Games, MD  atorvastatin (LIPITOR) 40  MG tablet TAKE 1 TABLET BY MOUTH IN THE  EVENING AT 6 PM 09/08/23  Yes Agapito Games, MD  Blood Glucose Monitoring Suppl (FREESTYLE LITE) DEVI  11/06/19  Yes [provider]  celecoxib (CELEBREX) 100 MG capsule Take 1 capsule by mouth twice a day 08/16/23  Yes   cephALEXin (KEFLEX) 500 MG capsule Take 1 capsule (500 mg total) by mouth 2 (two) times daily. 10/26/23  Yes Eustace Moore, MD  chlorthalidone (HYGROTON) 50 MG tablet Take 1 tablet (50 mg total) by mouth daily. Patient taking differently: Take 50 mg by mouth daily. Taking 1/2 pill/day 06/24/23  Yes Agapito Games, MD  DULoxetine (CYMBALTA) 60 MG capsule TAKE 1 CAPSULE BY MOUTH DAILY 12/10/22 12/10/23 Yes Agapito Games, MD  glucose blood test strip Freestyle Lite  test strips.  FOR CHECKING BLOOD SUGAR ONCE DAILY 07/03/22  Yes Agapito Games, MD  ibuprofen (ADVIL) 800 MG tablet Take 1 tablet (800 mg total) by mouth every 8 (eight) hours as needed. 07/08/23  Yes Monica Becton, MD  Lancets Misc. (ACCU-CHEK FASTCLIX LANCET) KIT Use to check blood sugar once daily. Dx code: E11.69 02/16/17  Yes Agapito Games, MD  levothyroxine (SYNTHROID) 137 MCG tablet TAKE 1 TABLET BY MOUTH ONCE  DAILY BEFORE BREAKFAST 12/10/22 12/10/23 Yes Agapito Games, MD  losartan (COZAAR) 100 MG tablet TAKE 1 TABLET BY MOUTH ONCE  DAILY 04/13/23 04/12/24 Yes Agapito Games, MD  meloxicam (MOBIC) 15 MG tablet TAKE 1 TABLET BY MOUTH ONCE  DAILY 10/22/23 10/21/24 Yes Agapito Games, MD  pantoprazole (PROTONIX) 40 MG tablet TAKE 1 TABLET BY MOUTH DAILY 04/13/23  Yes Agapito Games, MD  potassium chloride SA (KLOR-CON M) 20 MEQ tablet TAKE 1 TABLET BY MOUTH TWICE  DAILY 06/08/23  Yes Agapito Games, MD  Semaglutide, 2 MG/DOSE, 8 MG/3ML SOPN Inject 2 mg as directed once a week. 10/22/22  Yes Agapito Games, MD    Family History Family History  Problem Relation Age of Onset   Colon cancer Father 68       colon/ polyps   Heart attack Father    Stroke Father    Cancer Father    Diabetes Father    Hyperlipidemia Mother        Living   Hypertension Mother    Arthritis Mother    Colon cancer Paternal Grandmother    Stroke Paternal Grandmother    Heart attack Paternal Grandmother        heart attack   Cancer Paternal Grandmother    Diabetes Paternal Grandmother    Diabetes Other        grandmother   Supraventricular tachycardia Sister    Hyperlipidemia Maternal Grandmother    Varicose Veins Maternal Grandmother    Cancer Paternal Aunt    Cancer Paternal Uncle    Esophageal cancer Neg Hx    Stomach cancer Neg Hx    Rectal cancer Neg Hx    Breast cancer Neg Hx     Social History Social History   Tobacco Use   Smoking  status: Never   Smokeless tobacco: Never  Vaping Use   Vaping status: Never Used  Substance Use Topics   Alcohol use: Yes    Alcohol/week: 2.0 standard drinks of alcohol    Types: 2 Cans of beer per week    Comment: occasional beer on the weekends.     Drug use: No     Allergies  Niacin and related and Gabapentin   Review of Systems Review of Systems See HPI  Physical Exam Triage Vital Signs ED Triage Vitals  Encounter Vitals Group     BP 10/26/23 1437 135/75     Systolic BP Percentile --      Diastolic BP Percentile --      Pulse Rate 10/26/23 1437 83     Resp 10/26/23 1437 16     Temp 10/26/23 1437 97.7 F (36.5 C)     Temp Source 10/26/23 1437 Oral     SpO2 10/26/23 1437 97 %     Weight 10/26/23 1438 200 lb (90.7 kg)     Height 10/26/23 1438 5' 5.5" (1.664 m)     Head Circumference --      Peak Flow --      Pain Score 10/26/23 1438 0     Pain Loc --      Pain Education --      Exclude from Growth Chart --    No data found.  Updated Vital Signs BP 135/75 (BP Location: Right Arm)   Pulse 83   Temp 97.7 F (36.5 C) (Oral)   Resp 16   Ht 5' 5.5" (1.664 m)   Wt 90.7 kg   SpO2 97%   BMI 32.78 kg/m      Physical Exam Constitutional:      General: She is not in acute distress.    Appearance: She is well-developed.  HENT:     Head: Normocephalic and atraumatic.  Eyes:     Conjunctiva/sclera: Conjunctivae normal.     Pupils: Pupils are equal, round, and reactive to light.  Cardiovascular:     Rate and Rhythm: Normal rate.  Pulmonary:     Effort: Pulmonary effort is normal. No respiratory distress.  Abdominal:     General: There is no distension.     Palpations: Abdomen is soft.  Musculoskeletal:        General: Normal range of motion.     Cervical back: Normal range of motion.  Feet:     Left foot:     Toenail Condition: Left toenails are abnormally thick.     Comments: The left great toenail is a avulsed from the base, and is attached only by  a couple millimeters at the tip.  Bleeding's been controlled with pressure.  The one edge of the nail is dystrophic from fungal disease. Skin:    General: Skin is warm and dry.  Neurological:     Mental Status: She is alert.     Gait: Gait abnormal.      UC Treatments / Results  Labs (all labs ordered are listed, but only abnormal results are displayed) Labs Reviewed - No data to display  EKG   Radiology No results found.  Procedures Excise Ingrown Toenail  Date/Time: 10/26/2023 3:51 PM  Performed by: Eustace Moore, MD Authorized by: Eustace Moore, MD   Consent:    Consent obtained:  Verbal   Alternatives discussed:  Delayed treatment Universal protocol:    Patient identity confirmed:  Verbally with patient Pre-procedure details:    Skin preparation:  Chlorhexidine Procedure details:    Location:  Foot   Foot location:  L big toe Anesthesia:    Anesthesia method:  Nerve block   Block needle gauge:  27 G   Block anesthetic:  Lidocaine 2% w/o epi   Block injection procedure:  Anatomic landmarks identified, introduced needle, incremental injection and negative aspiration  for blood   Block outcome:  Anesthesia achieved Nail Removal:    Nail removed:  Complete   Nail bed repaired: no     Removed nail replaced and anchored: no     Stented with:  Iodoform gauze Trephination:    Subungual hematoma drained: no   Post-procedure details:    Dressing:  Petrolatum-impregnated gauze   Procedure completion:  Tolerated  (including critical care time)  Medications Ordered in UC Medications - No data to display  Initial Impression / Assessment and Plan / UC Course  I have reviewed the triage vital signs and the nursing notes.  Pertinent labs & imaging results that were available during my care of the patient were reviewed by me and considered in my medical decision making (see chart for details).     The nail was traumatically avulsed except for a small rim.   This was repaired by removing nail.  There is no nailbed laceration that needed to be repaired.  Iodoform was placed into the nail fold.  She is to remove this dressing in 2 days by soaking.  I did place patient on antibiotics prophylactically because of her inability to feel increased pain from infection Final Clinical Impressions(s) / UC Diagnoses   Final diagnoses:  Toenail avulsion, initial encounter  Laceration of left great toe without foreign body with damage to nail, initial encounter     Discharge Instructions      Change dressing every day or two Apply antibiotic ointment and band aid or wrap until the nail bed stops bleeding Take the antibiotic 2 x a day This is to prevent infection Have your PCP check wound   ED Prescriptions     Medication Sig Dispense Auth. Provider   cephALEXin (KEFLEX) 500 MG capsule Take 1 capsule (500 mg total) by mouth 2 (two) times daily. 10 capsule Eustace Moore, MD      PDMP not reviewed this encounter.   Eustace Moore, MD 10/26/23 [phone]

## 2023-10-26 NOTE — Discharge Instructions (Signed)
Change dressing every day or two Apply antibiotic ointment and band aid or wrap until the nail bed stops bleeding Take the antibiotic 2 x a day This is to prevent infection Have your PCP check wound

## 2023-10-26 NOTE — ED Triage Notes (Signed)
Patient c/o left big toe injury today.  Patient was moving a piece of plywood that fell on her left big toe.  There is a laceration to the toe.  Tdap 05/26/2005.

## 2023-10-27 ENCOUNTER — Ambulatory Visit: Payer: Medicare Other | Admitting: Family Medicine

## 2023-10-28 ENCOUNTER — Ambulatory Visit: Payer: Medicare Other

## 2023-10-28 DIAGNOSIS — M25512 Pain in left shoulder: Secondary | ICD-10-CM

## 2023-10-28 DIAGNOSIS — M6281 Muscle weakness (generalized): Secondary | ICD-10-CM | POA: Diagnosis not present

## 2023-10-28 NOTE — Therapy (Signed)
OUTPATIENT PHYSICAL THERAPY SHOULDER TREATMENT   Patient Name: Alexis Mcneil MRN: [id number] DOB:October 29, 1956, 67 y.o., female Today's Date: 10/28/2023  END OF SESSION:  PT End of Session - 10/28/23 1534     Visit Number 8    Number of Visits 16    Date for PT Re-Evaluation 11/26/23    Authorization Type medicare    Progress Note Due on Visit 10    PT Start Time 1105    PT Stop Time 1144    PT Time Calculation (min) 39 min    Activity Tolerance Patient tolerated treatment well    Behavior During Therapy [facility] for tasks assessed/performed                  Past Medical History:  Diagnosis Date   Anemia    Arthritis    KNEE,SHOULDERS   Cataract January 2021   Eye appt visit   Diabetes mellitus without complication (HCC)    GERD (gastroesophageal reflux disease)    Heart murmur    Hyperlipidemia    Hypertension    IFG (impaired fasting glucose)    Neuromuscular disorder (HCC)    NEUROPATHY FEET   OSA (obstructive sleep apnea)    CPAP   Sleep apnea    CPAP   Thyroid disease    Past Surgical History:  Procedure Laterality Date   COLONOSCOPY     endometrial polyp     January 2014   EYE SURGERY  1988   Cyst on eye removed   mouth palate surgery repair  1961   large tear   ROOT CANEL     2 WEEKS AGO   rt knee surgery arthroscopy  2009   torn meniscus/medial   torn ligament shoulder  1975   repaired   Patient Active Problem List   Diagnosis Date Noted   Primary osteoarthritis of right knee 12/04/2021   CKD (chronic kidney disease) stage 3, GFR 30-59 ml/min (HCC) 11/12/2021   Pre-ulcerative corn or callous 05/20/2020   Excessive sweating 05/20/2020   Gout 03/07/2018   Hereditary and idiopathic peripheral neuropathy 09/24/2015   Trochanteric bursitis of both hips 10/08/2014   Lumbar spinal stenosis 09/06/2014   Right knee pain 09/06/2014   Obstructive sleep apnea 09/26/2010   Left rotator cuff tear 03/18/2010   PARESTHESIA, HANDS 03/18/2010    Diabetes mellitus type 2 in obese 02/07/2008   Hypothyroidism 01/06/2007   Hyperlipidemia 01/06/2007   ANEMIA NOS 01/06/2007   HYPERTENSION, BENIGN ESSENTIAL 01/06/2007    PCP: Nani Gasser, MD  REFERRING PROVIDER: Ramond Marrow, MD  REFERRING DIAG: [phone] (ICD-10-CM) - S/P arthroscopy of left shoulder Z98.890 (ICD-10-CM) - S/P left rotator cuff repair  THERAPY DIAG:  Acute pain of left shoulder  Muscle weakness (generalized)  Rationale for Evaluation and Treatment: Rehabilitation  ONSET DATE: 08/16/23  SUBJECTIVE:  SUBJECTIVE STATEMENT: Patient returned to the clinic stating that overall the L shoulder is coming along. She is working on her home program and is noting improvement but still describes a ways to go to restore L shoulder motion and function.Marland Kitchen  EVAL: The patient reports she had an injury when she was installing shelves in her closet in late September/early August. She reports she felt a pop in her shoulder. She underwent L rotator cuff repair with SAD, distal clavicle excision, biceps tenodesis, and repair of subscapularis and supraspinatus. Due to extent of repair, she did not start physical therapy to allow tissue healing to occur. She is wearing sling until she returns to surgeon on Friday.  Hand dominance: Right  PERTINENT HISTORY: Diabetes, h/o R shoulder injury, HTN, bilat hip bursitis, hypothyroidism  PAIN:  Are you having pain? Yes: NPRS scale: 1/10 Pain location: shoulder  Pain description: soreness Aggravating factors: movement at times Relieving factors: none  PRECAUTIONS: Shoulder  WEIGHT BEARING RESTRICTIONS: follow protocol  FALLS:  Has patient fallen in last 6 months? No  PATIENT GOALS:return to full use of arm.   OBJECTIVE:  Note: Objective measures were  completed at Evaluation unless otherwise noted. PATIENT SURVEYS:  FOTO 22% (goal to 68%) POSTURE: Rounded shoulders, forward head UPPER EXTREMITY ROM:  Neck ROM 30 degrees extension, and 20 degrees of lateral SB bilat with pain and rotation to 50 degrees bilat Passive ROM Right eval Left eval Left 10/04/23 LEFT Left 12/16  Shoulder flexion  90  AAROM with cane 140 AAROM with cane 150 AAROM with cane 145  Shoulder extension       Shoulder abduction  60 in plane of scapula  80 in plane of scapula 60 in plane of scapula  Shoulder adduction       Shoulder internal rotation       Shoulder external rotation  38 with arm by side 40 with arm by side AAROM cane    Elbow flexion  WNLs     Elbow extension  WNLs     Wrist flexion       Wrist extension  Mild limitation 60 deg     Wrist ulnar deviation       Wrist radial deviation       Wrist pronation       Wrist supination       (Blank rows = not tested) UPPER EXTREMITY MMT: NOT TESTED MMT Right eval Left eval  Shoulder flexion    Shoulder extension    Shoulder abduction    Shoulder adduction    Shoulder internal rotation    Shoulder external rotation    Middle trapezius    Lower trapezius    Elbow flexion    Elbow extension    Wrist flexion    Wrist extension    Wrist ulnar deviation    Wrist radial deviation    Wrist pronation    Wrist supination    Grip strength (lbs)    (Blank rows = not tested) PALPATION:  Soreness in anterior pectoralis musculature, and through lateral deltoid    [facility] Adult PT Treatment:                                                DATE: 10/28/2023 Manual Therapy: Supine: Segmental R cervical rotation mobilizations, multiple segments Pin and stretch soft tissue mobilization L levator  scapulae Soft tissue mobilization L supraspinatus muscle belly Therapeutic Exercise: Supine: Shoulder external rotation isometrics pulling apart on dowel, 10 x 10 sec holds Shoulder external rotation  isometrics puling apart on dowel + chest press, x 10 reps L shoulder PROM, flexion, abduction, external rotation  [facility] Adult PT Treatment:                                                DATE: 10/25/23 Therapeutic Exercise: Pulley flexion, abduction x 2 min each Isometrics: Seated ball press in lap for pecs, ext into ball for lats, deltoids 10 x 5 sec hold Standing with noodle behind back: scap squeeze 10 x 5 sec, ER AAROM x 10 Standing abd AAROM with dowel x 15 Supine AAROM flexion with dowel AAROM horizontal abd/add Elbow flex/ext x 12 PROM ER at 80 degrees abduction Manual: STM in sidelying to L UT and paraspinals to decrease muscle tension and soreness  Modalities: Vaso (L) shoulder, 34 degrees, med compression x 10 min  [facility] Adult PT Treatment:                                                DATE: 10/18/23 Therapeutic Exercise: Pulley flexion, abduction x 2 min each Standing with noodle behind back: scap squeeze 10 x 5 sec, ER AAROM x 10 Standing abd AAROM with dowel x 10 Supine AAROM flexion with dowel AAROM horizontal abd/add Elbow flex/ext x 12 Isometric abduction x 5 reps against 2 finger pressure by PT PROM ER at 80 degrees abduction  Modalities: Vaso (L) shoulder, 34 degrees, med compression x 10 min   [facility] Adult PT Treatment:                                                DATE: 10/13/2023 Therapeutic Exercise: Standing with noodle --> scap squeezes 12x5" Pulleys --> AAROM flexion/abduction L AAROM x 3 min each Shoulder shrugs x20 Standing against noodle --> L AAROM shoulder ER 10x10" Supine: AAROM cane shoulder press PROM ER at 80 degrees abduction AAROM horizontal ab/adduction  Wrist flexion/extension with arm abducted for neural gliding Elbow flexion AROM x 12 reps Isometric abduction x 5 reps against 2 finger pressure by PT Modalities: Vaso (L) shoulder, 34 degrees, med compression x 10 min    [facility] Adult PT Treatment:                                                 DATE: 10/11/23 Therapeutic Exercise: Seated Pulleys 2-3 minutes flexion/abduction L AAROM Shoulder shrugs Standing Scapular squeeze x 12 reps with pool noodle AAROM cane L side elbow at 90 x 10 reps Supine AAROM cane AAROM cane shoulder press PROM ER at 80 degrees abduction AAROM horizontal ab/adduction  Wrist flexion/extension with arm abducted for neural gliding Elbow flexion AROM x 12 reps Isometric abduction x 5 reps against 2 finger pressure by PT AAROM (therapist helping support arm) flexion to 90 degrees Manual Therapy: IASTM L  shoulder deltoid and biceps Sidelying scapular mobilization, diagonal patterns Modalities: Game Ready x 10 minutes , 34 degrees    PATIENT EDUCATION: Education details: HEP Person educated: Patient Education method: Programmer, multimedia, Facilities manager, and Handouts Education comprehension: verbalized understanding, returned demonstration, and needs further education  HOME EXERCISE PROGRAM: Access Code: [email or web address] URL: [email or web address] Date: 10/06/2023 Prepared by: Corlis Leak  Program Notes DON'T PUSH INTO PAIN. Gentle movement to begin. Wear sling until your appointment on Friday.   Exercises - Circular Shoulder Pendulum with Table Support  - 2 x daily - 7 x weekly - 1 sets - 10 reps - Seated Shoulder Flexion Towel Slide at Table Top  - 2 x daily - 7 x weekly - 1 sets - 10 reps - Seated Shoulder External Rotation AAROM with Cane and Hand in Neutral  - 2 x daily - 7 x weekly - 1 sets - 10 reps - Supine Shoulder Flexion with Dowel  - 2 x daily - 7 x weekly - 1 sets - 10 reps - Supine Shoulder Flexion Extension AAROM with Dowel  - 2 x daily - 7 x weekly - 1 sets - 10 reps - Supine Scapular Retraction  - 2 x daily - 7 x weekly - 1 sets - 10 reps - Seated Scapular Retraction  - 2 x daily - 7 x weekly - 1-2 sets - 10 reps - 10 sec  hold - Standing Infraspinatus/Teres Minor Release with Ball at [employer]  - 2 x daily - 7 x  weekly  ASSESSMENT:  CLINICAL IMPRESSION: Continued progressing L shoulder ROM and strengthening today as well as addressing pain at L levator scapulae. The patient did note L shoulder pain with isometrics located just proximal to the L acromion. She continues to have deficits in L shoulder ROM, strength, and function - physical therapy remains indicated.  EVAL:Patient is a 67 y.o. female who was seen today for physical therapy evaluation and treatment s/p L RTC repair on 08/16/23. Due to complexity of her surgery, PT was held until 6 weeks. The patient tolerated evaluation with minimal tightness at end range (mostly with forward flexion). She does report using her L UE at times in daily tasks and c/o soreness if she attempts to lift it. PT discussed at this time she should only perform PROM in therapy and some AAROM at home (per HEP) for the shoulder--- we will progress to patient tissue tolerance. PT to address deficits to gain functional use of the L UE.   OBJECTIVE IMPAIRMENTS: decreased activity tolerance, decreased ROM, decreased strength, hypomobility, increased edema, increased fascial restrictions, impaired flexibility, impaired UE functional use, postural dysfunction, and pain.    GOALS: Goals reviewed with patient? Yes  SHORT TERM GOALS: Target date: 10/27/23  The patient will be indep with HEP within protocol. Baseline: initiated at eval Goal status: MET   2. The patient will improve AAROM and/or PROM to 140 degrees shoulder flexion. Baseline:  see chart above. Goal status: MET     LONG TERM GOALS: Target date: 11/26/2023  The patient will be indep with HEP. Baseline: initiated at eval Goal status: INITIAL   2.   The patient will improve FOTO score to > or equal to 48% Baseline: 22% Goal status: INITIAL   3. The patient will demonstrate AROM to 120 degrees flexion, 45 degrees ER, and 90 degrees abduction in supine. Baseline: see chart  Goal status: INITIAL   4.  The  patient  will demo improved upright posture with posterior shoulder  girdle engaged to promote improved glenohumeral joint mobility. Baseline: rounded positioning Goal status: INITIAL   5.  The patient will have MMT performed when appropriate in protocol-- goal to follow. Baseline: not tested Goal status: INITIAL   PLAN:  PT FREQUENCY: 2x/week  PT DURATION: 8 weeks  PLANNED INTERVENTIONS: 97110-Therapeutic exercises, 97530- Therapeutic activity, [id number]- Neuromuscular re-education, 97535- Self Care, 56213- Manual therapy, 97014- Electrical stimulation (unattended), 97016- Vasopneumatic device, 97033- Ionotophoresis 4mg /ml Dexamethasone, Patient/Family education, Taping, Dry Needling, Joint mobilization, Scar mobilization, Cryotherapy, and Moist heat  PLAN FOR NEXT SESSION: Progress PROM to tolerance, AAROM with weight of arm supported, progress per protocol recognizing that we are starting 6 weeks post surgery so will progress to tissue tolerance and with guidance from the protocol.    Edmonia Caprio, PT, PhD, DPT  10/28/2023, 3:49 PM

## 2023-11-01 ENCOUNTER — Ambulatory Visit: Payer: Medicare Other

## 2023-11-01 DIAGNOSIS — M25512 Pain in left shoulder: Secondary | ICD-10-CM | POA: Diagnosis not present

## 2023-11-01 DIAGNOSIS — M6281 Muscle weakness (generalized): Secondary | ICD-10-CM

## 2023-11-01 NOTE — Therapy (Signed)
OUTPATIENT PHYSICAL THERAPY SHOULDER TREATMENT   Patient Name: Alexis Mcneil MRN: [id number] DOB:07-13-1956, 67 y.o., female Today's Date: 11/01/2023  END OF SESSION:  PT End of Session - 11/01/23 0847     Visit Number 9    Number of Visits 16    Date for PT Re-Evaluation 11/26/23    Authorization Type medicare    Progress Note Due on Visit 10    PT Start Time 0848    PT Stop Time 0936    PT Time Calculation (min) 48 min    Activity Tolerance Patient tolerated treatment well    Behavior During Therapy [facility] for tasks assessed/performed            Past Medical History:  Diagnosis Date   Anemia    Arthritis    KNEE,SHOULDERS   Cataract January 2021   Eye appt visit   Diabetes mellitus without complication (HCC)    GERD (gastroesophageal reflux disease)    Heart murmur    Hyperlipidemia    Hypertension    IFG (impaired fasting glucose)    Neuromuscular disorder (HCC)    NEUROPATHY FEET   OSA (obstructive sleep apnea)    CPAP   Sleep apnea    CPAP   Thyroid disease    Past Surgical History:  Procedure Laterality Date   COLONOSCOPY     endometrial polyp     January 2014   EYE SURGERY  1988   Cyst on eye removed   mouth palate surgery repair  1961   large tear   ROOT CANEL     2 WEEKS AGO   rt knee surgery arthroscopy  2009   torn meniscus/medial   torn ligament shoulder  1975   repaired   Patient Active Problem List   Diagnosis Date Noted   Primary osteoarthritis of right knee 12/04/2021   CKD (chronic kidney disease) stage 3, GFR 30-59 ml/min (HCC) 11/12/2021   Pre-ulcerative corn or callous 05/20/2020   Excessive sweating 05/20/2020   Gout 03/07/2018   Hereditary and idiopathic peripheral neuropathy 09/24/2015   Trochanteric bursitis of both hips 10/08/2014   Lumbar spinal stenosis 09/06/2014   Right knee pain 09/06/2014   Obstructive sleep apnea 09/26/2010   Left rotator cuff tear 03/18/2010   PARESTHESIA, HANDS 03/18/2010   Diabetes mellitus  type 2 in obese 02/07/2008   Hypothyroidism 01/06/2007   Hyperlipidemia 01/06/2007   ANEMIA NOS 01/06/2007   HYPERTENSION, BENIGN ESSENTIAL 01/06/2007    PCP: Nani Gasser, MD  REFERRING PROVIDER: Ramond Marrow, MD  REFERRING DIAG: [phone] (ICD-10-CM) - S/P arthroscopy of left shoulder Z98.890 (ICD-10-CM) - S/P left rotator cuff repair  THERAPY DIAG:  Acute pain of left shoulder  Muscle weakness (generalized)  Rationale for Evaluation and Treatment: Rehabilitation  ONSET DATE: 08/16/23  SUBJECTIVE:  SUBJECTIVE STATEMENT: Patient reports she has had increased headaches since last PT visit and neck soreness is worse. Patient states she has noticed increased popping in shoulder when she sits upright after reaching down, states minimal pain with the popping.   EVAL: The patient reports she had an injury when she was installing shelves in her closet in late September/early August. She reports she felt a pop in her shoulder. She underwent L rotator cuff repair with SAD, distal clavicle excision, biceps tenodesis, and repair of subscapularis and supraspinatus. Due to extent of repair, she did not start physical therapy to allow tissue healing to occur. She is wearing sling until she returns to surgeon on Friday.  Hand dominance: Right  PERTINENT HISTORY: Diabetes, h/o R shoulder injury, HTN, bilat hip bursitis, hypothyroidism  PAIN:  Are you having pain? Yes: NPRS scale: 1/10 Pain location: shoulder  Pain description: soreness Aggravating factors: movement at times Relieving factors: none  PRECAUTIONS: Shoulder  WEIGHT BEARING RESTRICTIONS: follow protocol  FALLS:  Has patient fallen in last 6 months? No  PATIENT GOALS:return to full use of arm.   OBJECTIVE:  Note: Objective measures were  completed at Evaluation unless otherwise noted. PATIENT SURVEYS:  FOTO 22% (goal to 68%) POSTURE: Rounded shoulders, forward head UPPER EXTREMITY ROM:  Neck ROM 30 degrees extension, and 20 degrees of lateral SB bilat with pain and rotation to 50 degrees bilat Passive ROM Right eval Left eval Left 10/04/23 LEFT Left 12/16  Shoulder flexion  90  AAROM with cane 140 AAROM with cane 150 AAROM with cane 145  Shoulder extension       Shoulder abduction  60 in plane of scapula  80 in plane of scapula 60 in plane of scapula  Shoulder adduction       Shoulder internal rotation       Shoulder external rotation  38 with arm by side 40 with arm by side AAROM cane    Elbow flexion  WNLs     Elbow extension  WNLs     Wrist flexion       Wrist extension  Mild limitation 60 deg     Wrist ulnar deviation       Wrist radial deviation       Wrist pronation       Wrist supination       (Blank rows = not tested) UPPER EXTREMITY MMT: NOT TESTED MMT Right eval Left eval  Shoulder flexion    Shoulder extension    Shoulder abduction    Shoulder adduction    Shoulder internal rotation    Shoulder external rotation    Middle trapezius    Lower trapezius    Elbow flexion    Elbow extension    Wrist flexion    Wrist extension    Wrist ulnar deviation    Wrist radial deviation    Wrist pronation    Wrist supination    Grip strength (lbs)    (Blank rows = not tested) PALPATION:  Soreness in anterior pectoralis musculature, and through lateral deltoid     [facility] Adult PT Treatment:                                                DATE: 11/01/2023 Therapeutic Exercise: Seated UT/LS stretches Standing against noodle: Scap squeezes AAROM shoulder ER with cane x15 AAROM  shoulder flexion with dowel x15 Eccentric low bicep curls + 1#DB x10 Scaption arm raises + 1#DB x10 Manual Therapy: STM cervical paraspinals, cervical upglides, SO, SOT, UT/LS, cervical traction Modalities: Vaso (L)  shoulder, 34 degrees, med compression x 10 min    [facility] Adult PT Treatment:                                                DATE: 10/28/2023 Manual Therapy: Supine: Segmental R cervical rotation mobilizations, multiple segments Pin and stretch soft tissue mobilization L levator scapulae Soft tissue mobilization L supraspinatus muscle belly Therapeutic Exercise: Supine: Shoulder external rotation isometrics pulling apart on dowel, 10 x 10 sec holds Shoulder external rotation isometrics puling apart on dowel + chest press, x 10 reps L shoulder PROM, flexion, abduction, external rotation   [facility] Adult PT Treatment:                                                DATE: 10/25/23 Therapeutic Exercise: Pulley flexion, abduction x 2 min each Isometrics: Seated ball press in lap for pecs, ext into ball for lats, deltoids 10 x 5 sec hold Standing with noodle behind back: scap squeeze 10 x 5 sec, ER AAROM x 10 Standing abd AAROM with dowel x 15 Supine AAROM flexion with dowel AAROM horizontal abd/add Elbow flex/ext x 12 PROM ER at 80 degrees abduction Manual: STM in sidelying to L UT and paraspinals to decrease muscle tension and soreness  Modalities: Vaso (L) shoulder, 34 degrees, med compression x 10 min   PATIENT EDUCATION: Education details: HEP Person educated: Patient Education method: Programmer, multimedia, Facilities manager, and Handouts Education comprehension: verbalized understanding, returned demonstration, and needs further education  HOME EXERCISE PROGRAM: Access Code: [email or web address] URL: [email or web address] Date: 11/01/2023 Prepared by: Carlynn Herald  Exercises - Standing Bicep Curls Supinated with Dumbbells  - 1 x daily - 7 x weekly - 3 sets - 10 reps - Scaption with Dumbbells  - 1 x daily - 7 x weekly - 3 sets - 10 reps - Standing Shoulder Flexion AAROM with Dowel  - 1 x daily - 7 x weekly - 3 sets - 10 reps - Shoulder External Rotation and Scapular Retraction with  Resistance  - 1 x daily - 7 x weekly - 3 sets - 10 reps - Standing 'L' Stretch at Counter  - 1 x daily - 7 x weekly - 1 sets - 5-10 reps - 10 sec hold  ASSESSMENT:  CLINICAL IMPRESSION: Patient progressed to postural strengthening with very light resistance; patient tolerated well with no pain. Patient continues to demonstrate upper trap compensation and elevated scapula during shoulder flexion/scaption. HEP updated with lightly resisted shoulder strengthening exercises.   EVAL:Patient is a 67 y.o. female who was seen today for physical therapy evaluation and treatment s/p L RTC repair on 08/16/23. Due to complexity of her surgery, PT was held until 6 weeks. The patient tolerated evaluation with minimal tightness at end range (mostly with forward flexion). She does report using her L UE at times in daily tasks and c/o soreness if she attempts to lift it. PT discussed at this time she should only perform PROM in therapy and some AAROM at home (per HEP) for the  shoulder--- we will progress to patient tissue tolerance. PT to address deficits to gain functional use of the L UE.   OBJECTIVE IMPAIRMENTS: decreased activity tolerance, decreased ROM, decreased strength, hypomobility, increased edema, increased fascial restrictions, impaired flexibility, impaired UE functional use, postural dysfunction, and pain.    GOALS: Goals reviewed with patient? Yes  SHORT TERM GOALS: Target date: 10/27/23  The patient will be indep with HEP within protocol. Baseline: initiated at eval Goal status: MET   2. The patient will improve AAROM and/or PROM to 140 degrees shoulder flexion. Baseline:  see chart above. Goal status: MET     LONG TERM GOALS: Target date: 11/26/2023  The patient will be indep with HEP. Baseline: initiated at eval Goal status: INITIAL   2.   The patient will improve FOTO score to > or equal to 48% Baseline: 22% Goal status: INITIAL   3. The patient will demonstrate AROM to 120  degrees flexion, 45 degrees ER, and 90 degrees abduction in supine. Baseline: see chart  Goal status: INITIAL   4.  The patient  will demo improved upright posture with posterior shoulder girdle engaged to promote improved glenohumeral joint mobility. Baseline: rounded positioning Goal status: INITIAL   5.  The patient will have MMT performed when appropriate in protocol-- goal to follow. Baseline: not tested Goal status: INITIAL   PLAN:  PT FREQUENCY: 2x/week  PT DURATION: 8 weeks  PLANNED INTERVENTIONS: 97110-Therapeutic exercises, 97530- Therapeutic activity, [id number]- Neuromuscular re-education, 97535- Self Care, 16109- Manual therapy, 97014- Electrical stimulation (unattended), 97016- Vasopneumatic device, 97033- Ionotophoresis 4mg /ml Dexamethasone, Patient/Family education, Taping, Dry Needling, Joint mobilization, Scar mobilization, Cryotherapy, and Moist heat  PLAN FOR NEXT SESSION: Progress per protocol (Stage 3); follow-up on updated HEP  Carlynn Herald, PTA 11/01/2023, 9:49 AM

## 2023-11-08 ENCOUNTER — Ambulatory Visit (INDEPENDENT_AMBULATORY_CARE_PROVIDER_SITE_OTHER): Payer: Medicare Other | Admitting: Family Medicine

## 2023-11-08 ENCOUNTER — Ambulatory Visit: Payer: Medicare Other

## 2023-11-08 ENCOUNTER — Telehealth: Payer: Self-pay

## 2023-11-08 VITALS — BP 118/47 | HR 78 | Ht 65.5 in | Wt 198.0 lb

## 2023-11-08 DIAGNOSIS — E1169 Type 2 diabetes mellitus with other specified complication: Secondary | ICD-10-CM

## 2023-11-08 DIAGNOSIS — E669 Obesity, unspecified: Secondary | ICD-10-CM | POA: Diagnosis not present

## 2023-11-08 DIAGNOSIS — I10 Essential (primary) hypertension: Secondary | ICD-10-CM

## 2023-11-08 DIAGNOSIS — M25512 Pain in left shoulder: Secondary | ICD-10-CM | POA: Diagnosis not present

## 2023-11-08 DIAGNOSIS — Z7985 Long-term (current) use of injectable non-insulin antidiabetic drugs: Secondary | ICD-10-CM | POA: Diagnosis not present

## 2023-11-08 DIAGNOSIS — S46012A Strain of muscle(s) and tendon(s) of the rotator cuff of left shoulder, initial encounter: Secondary | ICD-10-CM | POA: Diagnosis not present

## 2023-11-08 DIAGNOSIS — N1831 Chronic kidney disease, stage 3a: Secondary | ICD-10-CM

## 2023-11-08 DIAGNOSIS — E038 Other specified hypothyroidism: Secondary | ICD-10-CM | POA: Diagnosis not present

## 2023-11-08 DIAGNOSIS — M6281 Muscle weakness (generalized): Secondary | ICD-10-CM | POA: Diagnosis not present

## 2023-11-08 LAB — POCT GLYCOSYLATED HEMOGLOBIN (HGB A1C): Hemoglobin A1C: 5.9 % — AB (ref 4.0–5.6)

## 2023-11-08 NOTE — Assessment & Plan Note (Signed)
Continue to follow renal function every 6 months. 

## 2023-11-08 NOTE — Therapy (Signed)
OUTPATIENT PHYSICAL THERAPY SHOULDER TREATMENT Progress Note  Reporting Period 09/27/2023 to 11/08/2023  See note below for Objective Data and Assessment of Progress/Goals.      Patient Name: Alexis Mcneil MRN: [id number] DOB:Oct 28, 1956, 67 y.o., female Today's Date: 11/08/2023  END OF SESSION:  PT End of Session - 11/08/23 0931     Visit Number 10    Number of Visits 16    Date for PT Re-Evaluation 11/26/23    Authorization Type medicare    Progress Note Due on Visit 10    PT Start Time 0932    PT Stop Time 1022    PT Time Calculation (min) 50 min    Activity Tolerance Patient tolerated treatment well    Behavior During Therapy [facility] for tasks assessed/performed            Past Medical History:  Diagnosis Date   Anemia    Arthritis    KNEE,SHOULDERS   Cataract January 2021   Eye appt visit   Diabetes mellitus without complication (HCC)    GERD (gastroesophageal reflux disease)    Heart murmur    Hyperlipidemia    Hypertension    IFG (impaired fasting glucose)    Neuromuscular disorder (HCC)    NEUROPATHY FEET   OSA (obstructive sleep apnea)    CPAP   Sleep apnea    CPAP   Thyroid disease    Past Surgical History:  Procedure Laterality Date   COLONOSCOPY     endometrial polyp     January 2014   EYE SURGERY  1988   Cyst on eye removed   mouth palate surgery repair  1961   large tear   ROOT CANEL     2 WEEKS AGO   rt knee surgery arthroscopy  2009   torn meniscus/medial   torn ligament shoulder  1975   repaired   Patient Active Problem List   Diagnosis Date Noted   Primary osteoarthritis of right knee 12/04/2021   CKD (chronic kidney disease) stage 3, GFR 30-59 ml/min (HCC) 11/12/2021   Pre-ulcerative corn or callous 05/20/2020   Excessive sweating 05/20/2020   Gout 03/07/2018   Hereditary and idiopathic peripheral neuropathy 09/24/2015   Trochanteric bursitis of both hips 10/08/2014   Lumbar spinal stenosis 09/06/2014   Right knee pain  09/06/2014   Obstructive sleep apnea 09/26/2010   Left rotator cuff tear 03/18/2010   PARESTHESIA, HANDS 03/18/2010   Diabetes mellitus type 2 in obese 02/07/2008   Hypothyroidism 01/06/2007   Hyperlipidemia 01/06/2007   ANEMIA NOS 01/06/2007   HYPERTENSION, BENIGN ESSENTIAL 01/06/2007    PCP: Nani Gasser, MD  REFERRING PROVIDER: Ramond Marrow, MD  REFERRING DIAG: [phone] (ICD-10-CM) - S/P arthroscopy of left shoulder Z98.890 (ICD-10-CM) - S/P left rotator cuff repair  THERAPY DIAG:  Acute pain of left shoulder  Muscle weakness (generalized)  Rationale for Evaluation and Treatment: Rehabilitation  ONSET DATE: 08/16/23  SUBJECTIVE:  SUBJECTIVE STATEMENT:  Patient reports her neck is feeling much better, states she continues to have popping at shoulder but no pain.   EVAL: The patient reports she had an injury when she was installing shelves in her closet in late September/early August. She reports she felt a pop in her shoulder. She underwent L rotator cuff repair with SAD, distal clavicle excision, biceps tenodesis, and repair of subscapularis and supraspinatus. Due to extent of repair, she did not start physical therapy to allow tissue healing to occur. She is wearing sling until she returns to surgeon on Friday.  Hand dominance: Right  PERTINENT HISTORY: Diabetes, h/o R shoulder injury, HTN, bilat hip bursitis, hypothyroidism  PAIN:  Are you having pain? Yes: NPRS scale: 1/10 Pain location: shoulder  Pain description: soreness Aggravating factors: movement at times Relieving factors: none  PRECAUTIONS: Shoulder  WEIGHT BEARING RESTRICTIONS: follow protocol  FALLS:  Has patient fallen in last 6 months? No  PATIENT GOALS:return to full use of arm.   OBJECTIVE:  Note: Objective  measures were completed at Evaluation unless otherwise noted. PATIENT SURVEYS:  FOTO 22% (goal to 68%) POSTURE: Rounded shoulders, forward head UPPER EXTREMITY ROM:  Neck ROM 30 degrees extension, and 20 degrees of lateral SB bilat with pain and rotation to 50 degrees bilat Passive ROM Right eval Left eval Left 10/04/23 LEFT Left 12/16 Left 12/30  Shoulder flexion  90  AAROM with cane 140 AAROM with cane 150 AAROM with cane 145 AAROM (dowel) Supine 150 Standing 115  Shoulder extension        Shoulder abduction  60 in plane of scapula  80 in plane of scapula 60 in plane of scapula AAROM (dowel) Supine 115 Standing 115  Shoulder adduction        Shoulder internal rotation        Shoulder external rotation  38 with arm by side 40 with arm by side AAROM cane   AAROM  (dowel) Supine 50 Standing 45  Elbow flexion  WNLs      Elbow extension  WNLs      Wrist flexion        Wrist extension  Mild limitation 60 deg      Wrist ulnar deviation        Wrist radial deviation        Wrist pronation        Wrist supination        (Blank rows = not tested) UPPER EXTREMITY MMT: NOT TESTED MMT Right eval Left eval  Shoulder flexion    Shoulder extension    Shoulder abduction    Shoulder adduction    Shoulder internal rotation    Shoulder external rotation    Middle trapezius    Lower trapezius    Elbow flexion    Elbow extension    Wrist flexion    Wrist extension    Wrist ulnar deviation    Wrist radial deviation    Wrist pronation    Wrist supination    Grip strength (lbs)    (Blank rows = not tested) PALPATION:  Soreness in anterior pectoralis musculature, and through lateral deltoid     [facility] Adult PT Treatment:                                                DATE: 11/08/2023 Therapeutic Exercise: Pulleys:  flexion & abduction x each, horiz ER x20 Supine shoulder AAROM with dowel: flexion, abduction, ER Standing:  Shoulder AAROM with dowel: flexion, abduction,  ER Low bicep curls 1#DB 2x10 Scaption arm raises 1#DB 2x10 Counter "L" stretch  Shoulder IR with strap (lateral pull behind) 10x5" Modalities: Vaso (L) shoulder, 34 degrees, med compression x 10 min   [facility] Adult PT Treatment:                                                DATE: 11/01/2023 Therapeutic Exercise: Seated UT/LS stretches Standing against noodle: Scap squeezes AAROM shoulder ER with cane x15 AAROM shoulder flexion with dowel x15 Eccentric low bicep curls + 1#DB x10 Scaption arm raises + 1#DB x10 Manual Therapy: STM cervical paraspinals, cervical upglides, SO, SOT, UT/LS, cervical traction Modalities: Vaso (L) shoulder, 34 degrees, med compression x 10 min    [facility] Adult PT Treatment:                                                DATE: 10/28/2023 Manual Therapy: Supine: Segmental R cervical rotation mobilizations, multiple segments Pin and stretch soft tissue mobilization L levator scapulae Soft tissue mobilization L supraspinatus muscle belly Therapeutic Exercise: Supine: Shoulder external rotation isometrics pulling apart on dowel, 10 x 10 sec holds Shoulder external rotation isometrics puling apart on dowel + chest press, x 10 reps L shoulder PROM, flexion, abduction, external rotation   PATIENT EDUCATION: Education details: HEP Person educated: Patient Education method: Programmer, multimedia, Facilities manager, and Handouts Education comprehension: verbalized understanding, returned demonstration, and needs further education  HOME EXERCISE PROGRAM: Access Code: [email or web address] URL: [email or web address] Date: 11/01/2023 Prepared by: Carlynn Herald  Exercises - Standing Bicep Curls Supinated with Dumbbells  - 1 x daily - 7 x weekly - 3 sets - 10 reps - Scaption with Dumbbells  - 1 x daily - 7 x weekly - 3 sets - 10 reps - Standing Shoulder Flexion AAROM with Dowel  - 1 x daily - 7 x weekly - 3 sets - 10 reps - Shoulder External Rotation and Scapular  Retraction with Resistance  - 1 x daily - 7 x weekly - 3 sets - 10 reps - Standing 'L' Stretch at Counter  - 1 x daily - 7 x weekly - 1 sets - 5-10 reps - 10 sec hold  ASSESSMENT:  CLINICAL IMPRESSION: Patient is making good progression with shoulder AAROM, most notably with flexion (see above). Light resistance added with shoulder flexion and scaption, working to patient's tolerance and within protocol guidelines. Noted abduction compensation during AAROM shoulder external rotation, as well as elevated scapula compensation during AAROM shoulder abduction. Patient will continue to benefit from skilled therapy to continue progressing protocol guidelines.  EVAL:Patient is a 67 y.o. female who was seen today for physical therapy evaluation and treatment s/p L RTC repair on 08/16/23. Due to complexity of her surgery, PT was held until 6 weeks. The patient tolerated evaluation with minimal tightness at end range (mostly with forward flexion). She does report using her L UE at times in daily tasks and c/o soreness if she attempts to lift it. PT discussed at this time she should only perform PROM in therapy and some AAROM at  home (per HEP) for the shoulder--- we will progress to patient tissue tolerance. PT to address deficits to gain functional use of the L UE.   OBJECTIVE IMPAIRMENTS: decreased activity tolerance, decreased ROM, decreased strength, hypomobility, increased edema, increased fascial restrictions, impaired flexibility, impaired UE functional use, postural dysfunction, and pain.    GOALS: Goals reviewed with patient? Yes  SHORT TERM GOALS: Target date: 10/27/23  The patient will be indep with HEP within protocol. Baseline: initiated at eval Goal status: MET   2. The patient will improve AAROM and/or PROM to 140 degrees shoulder flexion. Baseline:  see chart above. Goal status: MET     LONG TERM GOALS: Target date: 11/26/2023  The patient will be indep with HEP. Baseline:  Goal  status: MET   2.   The patient will improve FOTO score to > or equal to 48% Baseline: 54% Goal status: MET   3. The patient will demonstrate AROM to 120 degrees flexion, 45 degrees ER, and 90 degrees abduction in supine. Baseline: see above  Goal status: MET   4.  The patient  will demo improved upright posture with posterior shoulder girdle engaged to promote improved glenohumeral joint mobility. Baseline: rounded positioning, needs cueing Goal status: IN PROGRESS   5.  The patient will have MMT performed when appropriate in protocol-- goal to follow. Baseline: not tested Goal status: INITIAL   PLAN:  PT FREQUENCY: 2x/week  PT DURATION: 8 weeks  PLANNED INTERVENTIONS: 97110-Therapeutic exercises, 97530- Therapeutic activity, [id number]- Neuromuscular re-education, 97535- Self Care, 30865- Manual therapy, 97014- Electrical stimulation (unattended), 97016- Vasopneumatic device, [id number]- Ionotophoresis 4mg /ml Dexamethasone, Patient/Family education, Taping, Dry Needling, Joint mobilization, Scar mobilization, Cryotherapy, and Moist heat  PLAN FOR NEXT SESSION: Progress per protocol (Stage 3)  Carlynn Herald, PTA 11/08/2023, 10:13 AM

## 2023-11-08 NOTE — Assessment & Plan Note (Signed)
Doing well with PT.

## 2023-11-08 NOTE — Telephone Encounter (Signed)
Initiated Prior authorization [id number] (2 MG/DOSE) 8MG /3ML pen-injectors Via: Covermymeds Case/Key:[id number] Status: Pending as of 11/08/23 Reason:next benefit year (benefits starting 11/10/2023) Notified Pt via: Mychart

## 2023-11-08 NOTE — Progress Notes (Addendum)
Established Patient Office Visit  Subjective  Patient ID: Alexis Mcneil, female    DOB: 1956-07-18  Age: 67 y.o. MRN: [id number]  Chief Complaint  Patient presents with   Diabetes   Hypertension    HPI  Diabetes - no hypoglycemic events. No wounds or sores that are not healing well. No increased thirst or urination. Checking glucose at home. Taking medications as prescribed without any side effects.  She knows the Ozempic will likely need repeat authorization I believe she was getting some patient assistance before.  Hypertension- Pt denies chest pain, SOB, dizziness, or heart palpitations.  Taking meds as directed w/o problems.  Denies medication side effects.    She is currently undergoing formal PT for her left shoulder after surgery.  There were several tears in the rotator cuff that had to be repaired she is actually doing really well range of motion is up to just below 90 degrees but she is making some great progress  Hypothyroidism-his pharmacy changed her brand of thyroid medication she has been tolerating it well.  For repeat TSH.  ROS    Objective:     BP (!) 118/47   Pulse 78   Ht 5' 5.5" (1.664 m)   Wt 198 lb (89.8 kg)   SpO2 99%   BMI 32.45 kg/m    Physical Exam Vitals and nursing note reviewed.  Constitutional:      Appearance: Normal appearance.  HENT:     Head: Normocephalic and atraumatic.  Eyes:     Conjunctiva/sclera: Conjunctivae normal.  Cardiovascular:     Rate and Rhythm: Normal rate and regular rhythm.  Pulmonary:     Effort: Pulmonary effort is normal.     Breath sounds: Normal breath sounds.  Skin:    General: Skin is warm and dry.  Neurological:     Mental Status: She is alert.  Psychiatric:        Mood and Affect: Mood normal.      Results for orders placed or performed in visit on 11/08/23  POCT HgB A1C  Result Value Ref Range   Hemoglobin A1C 5.9 (A) 4.0 - 5.6 %   HbA1c POC (<> result, manual entry)     HbA1c, POC  (prediabetic range)     HbA1c, POC (controlled diabetic range)        The 10-year ASCVD risk score (Arnett DK, et al., 2019) is: 14.1%    Assessment & Plan:   Problem List Items Addressed This Visit       Cardiovascular and Mediastinum   HYPERTENSION, BENIGN ESSENTIAL   Well controlled. Continue current regimen. Follow up in  56mo       Relevant Orders   TSH   CMP14+EGFR     Endocrine   Hypothyroidism   Due to recheck TSH after switching brands.  Currently on levothyroxine 137 mcg.      Relevant Orders   TSH   CMP14+EGFR   Diabetes mellitus type 2 in obese - Primary   A1C looks good today at 5.9 though it is just up slightly from prior but she is doing really well on the semaglutide just continue to work on healthy diet and regular exercise.  BMI is now down to 32 which is fantastic.      Relevant Orders   POCT HgB A1C (Completed)   TSH   CMP14+EGFR     Musculoskeletal and Integument   Left rotator cuff tear   Doing well with PT  Genitourinary   CKD (chronic kidney disease) stage 3, GFR 30-59 ml/min (HCC)   Continue to follow renal function every 6 months.      Relevant Orders   TSH   CMP14+EGFR    Return in about 4 months (around 03/08/2024) for Diabetes follow-up.    Nani Gasser, MD

## 2023-11-08 NOTE — Assessment & Plan Note (Addendum)
A1C looks good today at 5.9 though it is just up slightly from prior but she is doing really well on the semaglutide just continue to work on healthy diet and regular exercise.  BMI is now down to 32 which is fantastic.

## 2023-11-08 NOTE — Assessment & Plan Note (Signed)
Well controlled. Continue current regimen. Follow up in  4 mo 

## 2023-11-08 NOTE — Assessment & Plan Note (Addendum)
Due to recheck TSH after switching brands.  Currently on levothyroxine 137 mcg.

## 2023-11-11 ENCOUNTER — Other Ambulatory Visit: Payer: Self-pay | Admitting: Family Medicine

## 2023-11-11 ENCOUNTER — Ambulatory Visit: Payer: Medicare Other | Attending: Orthopaedic Surgery

## 2023-11-11 DIAGNOSIS — G609 Hereditary and idiopathic neuropathy, unspecified: Secondary | ICD-10-CM

## 2023-11-11 DIAGNOSIS — M25512 Pain in left shoulder: Secondary | ICD-10-CM | POA: Insufficient documentation

## 2023-11-11 DIAGNOSIS — M6281 Muscle weakness (generalized): Secondary | ICD-10-CM | POA: Insufficient documentation

## 2023-11-11 DIAGNOSIS — I10 Essential (primary) hypertension: Secondary | ICD-10-CM

## 2023-11-11 NOTE — Therapy (Signed)
 OUTPATIENT PHYSICAL THERAPY SHOULDER TREATMENT    Patient Name: Alexis Mcneil MRN: [id number] DOB:[birth year], 68 y.o., female Today's Date: 11/11/2023  END OF SESSION:  PT End of Session - 11/11/23 0848     Visit Number 11    Number of Visits 16    Date for PT Re-Evaluation 11/26/23    Authorization Type medicare    Progress Note Due on Visit 20    PT Start Time 0848    PT Stop Time 0932    PT Time Calculation (min) 44 min    Activity Tolerance Patient tolerated treatment well    Behavior During Therapy [facility] for tasks assessed/performed            Past Medical History:  Diagnosis Date   Anemia    Arthritis    KNEE,SHOULDERS   Cataract January 2021   Eye appt visit   Diabetes mellitus without complication (HCC)    GERD (gastroesophageal reflux disease)    Heart murmur    Hyperlipidemia    Hypertension    IFG (impaired fasting glucose)    Neuromuscular disorder (HCC)    NEUROPATHY FEET   OSA (obstructive sleep apnea)    CPAP   Sleep apnea    CPAP   Thyroid  disease    Past Surgical History:  Procedure Laterality Date   COLONOSCOPY     endometrial polyp     January 2014   EYE SURGERY  1988   Cyst on eye removed   mouth palate surgery repair  1961   large tear   ROOT CANEL     2 WEEKS AGO   rt knee surgery arthroscopy  2009   torn meniscus/medial   torn ligament shoulder  1975   repaired   Patient Active Problem List   Diagnosis Date Noted   Primary osteoarthritis of right knee 12/04/2021   CKD (chronic kidney disease) stage 3, GFR 30-59 ml/min (HCC) 11/12/2021   Pre-ulcerative corn or callous 05/20/2020   Excessive sweating 05/20/2020   Gout 03/07/2018   Hereditary and idiopathic peripheral neuropathy 09/24/2015   Trochanteric bursitis of both hips 10/08/2014   Lumbar spinal stenosis 09/06/2014   Right knee pain 09/06/2014   Obstructive sleep apnea 09/26/2010   Left rotator cuff tear 03/18/2010   PARESTHESIA, HANDS 03/18/2010   Diabetes mellitus  type 2 in obese 02/07/2008   Hypothyroidism 01/06/2007   Hyperlipidemia 01/06/2007   ANEMIA NOS 01/06/2007   HYPERTENSION, BENIGN ESSENTIAL 01/06/2007    PCP: Dorothyann Byars, MD  REFERRING PROVIDER: Bonner Hair, MD  REFERRING DIAG: [phone] (ICD-10-CM) - S/P arthroscopy of left shoulder Z98.890 (ICD-10-CM) - S/P left rotator cuff repair  THERAPY DIAG:  Acute pain of left shoulder  Muscle weakness (generalized)  Rationale for Evaluation and Treatment: Rehabilitation  ONSET DATE: 08/16/23  SUBJECTIVE:  SUBJECTIVE STATEMENT:  Patient reports her neck continues to feel better, states no change in shoulder popping with no pain.  Hand dominance: Right  PERTINENT HISTORY: Diabetes, h/o R shoulder injury, HTN, bilat hip bursitis, hypothyroidism  PAIN:  Are you having pain? Yes: NPRS scale: 1/10 Pain location: shoulder  Pain description: soreness Aggravating factors: movement at times Relieving factors: none  PRECAUTIONS: Shoulder  WEIGHT BEARING RESTRICTIONS: follow protocol  FALLS:  Has patient fallen in last 6 months? No  PATIENT GOALS:return to full use of arm.   OBJECTIVE:  Note: Objective measures were completed at Evaluation unless otherwise noted. PATIENT SURVEYS:  FOTO 22% (goal to 68%) POSTURE: Rounded shoulders, forward head UPPER EXTREMITY ROM:  Neck ROM 30 degrees extension, and 20 degrees of lateral SB bilat with pain and rotation to 50 degrees bilat Passive ROM Right eval Left eval Left 10/04/23 LEFT Left 12/16 Left 12/30  Shoulder flexion  90  AAROM with cane 140 AAROM with cane 150 AAROM with cane 145 AAROM (dowel) Supine 150 Standing 115  Shoulder extension        Shoulder abduction  60 in plane of scapula  80 in plane of scapula 60 in plane of scapula  AAROM (dowel) Supine 115 Standing 115  Shoulder adduction        Shoulder internal rotation        Shoulder external rotation  38 with arm by side 40 with arm by side AAROM cane   AAROM  (dowel) Supine 50 Standing 45  Elbow flexion  WNLs      Elbow extension  WNLs      Wrist flexion        Wrist extension  Mild limitation 60 deg      Wrist ulnar deviation        Wrist radial deviation        Wrist pronation        Wrist supination        (Blank rows = not tested) UPPER EXTREMITY MMT: NOT TESTED MMT Right eval Left eval  Shoulder flexion    Shoulder extension    Shoulder abduction    Shoulder adduction    Shoulder internal rotation    Shoulder external rotation    Middle trapezius    Lower trapezius    Elbow flexion    Elbow extension    Wrist flexion    Wrist extension    Wrist ulnar deviation    Wrist radial deviation    Wrist pronation    Wrist supination    Grip strength (lbs)    (Blank rows = not tested) PALPATION:  Soreness in anterior pectoralis musculature, and through lateral deltoid      [facility] Adult PT Treatment:                                                DATE: 11/11/2023 Therapeutic Exercise: Pulleys: flexion & scaption x each, horiz ER x20 Standing against noodle: Shoulder ER 1#DB x10 Shoulder flexion with dowel x10 Low bicep curls 1#DB x12 Scaption arm raises 1#DB x10 L stretch at counter  Small circles with ball on wall  Shoulder extension + YTB x10 Rows + RTB x10 Shoulder IR stretch with strap: horizontal & vertical 5x10 each  Modalities: Vaso (L) shoulder, 34 degrees, med compression x 10 min   [facility] Adult PT  Treatment:                                                DATE: 11/08/2023 Therapeutic Exercise: Pulleys: flexion & abduction x each, horiz ER x20 Supine shoulder AAROM with dowel: flexion, abduction, ER Standing:  Shoulder AAROM with dowel: flexion, abduction, ER Low bicep curls 1#DB 2x10 Scaption arm raises 1#DB  2x10 Counter L stretch  Shoulder IR with strap (lateral pull behind) 10x5 Modalities: Vaso (L) shoulder, 34 degrees, med compression x 10 min   [facility] Adult PT Treatment:                                                DATE: 11/01/2023 Therapeutic Exercise: Seated UT/LS stretches Standing against noodle: Scap squeezes AAROM shoulder ER with cane x15 AAROM shoulder flexion with dowel x15 Eccentric low bicep curls + 1#DB x10 Scaption arm raises + 1#DB x10 Manual Therapy: STM cervical paraspinals, cervical upglides, SO, SOT, UT/LS, cervical traction Modalities: Vaso (L) shoulder, 34 degrees, med compression x 10 min   PATIENT EDUCATION: Education details: HEP Person educated: Patient Education method: Programmer, Multimedia, Facilities Manager, and Handouts Education comprehension: verbalized understanding, returned demonstration, and needs further education  HOME EXERCISE PROGRAM: Access Code: [email or web address] URL: [email or web address] Date: 11/01/2023 Prepared by: Lamarr Price  Exercises - Standing Bicep Curls Supinated with Dumbbells  - 1 x daily - 7 x weekly - 3 sets - 10 reps - Scaption with Dumbbells  - 1 x daily - 7 x weekly - 3 sets - 10 reps - Standing Shoulder Flexion AAROM with Dowel  - 1 x daily - 7 x weekly - 3 sets - 10 reps - Shoulder External Rotation and Scapular Retraction with Resistance  - 1 x daily - 7 x weekly - 3 sets - 10 reps - Standing 'L' Stretch at Counter  - 1 x daily - 7 x weekly - 1 sets - 5-10 reps - 10 sec hold  ASSESSMENT:  CLINICAL IMPRESSION: Lightly resisted shoulder strengthening continues per protocol guidelines. Shoulder internal rotation stretch variations added to progress AROM.    OBJECTIVE IMPAIRMENTS: decreased activity tolerance, decreased ROM, decreased strength, hypomobility, increased edema, increased fascial restrictions, impaired flexibility, impaired UE functional use, postural dysfunction, and pain.    GOALS: Goals reviewed  with patient? Yes  SHORT TERM GOALS: Target date: 10/27/23  The patient will be indep with HEP within protocol. Baseline: initiated at eval Goal status: MET   2. The patient will improve AAROM and/or PROM to 140 degrees shoulder flexion. Baseline:  see chart above. Goal status: MET     LONG TERM GOALS: Target date: 11/26/2023  The patient will be indep with HEP. Baseline:  Goal status: MET   2.   The patient will improve FOTO score to > or equal to 48% Baseline: 54% Goal status: MET   3. The patient will demonstrate AROM to 120 degrees flexion, 45 degrees ER, and 90 degrees abduction in supine. Baseline: see above  Goal status: MET   4.  The patient  will demo improved upright posture with posterior shoulder girdle engaged to promote improved glenohumeral joint mobility. Baseline: rounded positioning, needs cueing Goal status: IN PROGRESS   5.  The patient will  have MMT performed when appropriate in protocol-- goal to follow. Baseline: not tested Goal status: INITIAL   PLAN:  PT FREQUENCY: 2x/week  PT DURATION: 8 weeks  PLANNED INTERVENTIONS: 97110-Therapeutic exercises, 97530- Therapeutic activity, [id number]- Neuromuscular re-education, 97535- Self Care, [id number]- Manual therapy, 97014- Electrical stimulation (unattended), 97016- Vasopneumatic device, 97033- Ionotophoresis 4mg /ml Dexamethasone , Patient/Family education, Taping, Dry Needling, Joint mobilization, Scar mobilization, Cryotherapy, and Moist heat  PLAN FOR NEXT SESSION: Shoulder MMT next visit. Progress per protocol (Stage 3)  Lamarr Price, PTA 11/11/2023, 9:23 AM

## 2023-11-15 ENCOUNTER — Encounter: Payer: Self-pay | Admitting: Rehabilitative and Restorative Service Providers"

## 2023-11-15 ENCOUNTER — Ambulatory Visit: Payer: Medicare Other | Admitting: Rehabilitative and Restorative Service Providers"

## 2023-11-15 DIAGNOSIS — M6281 Muscle weakness (generalized): Secondary | ICD-10-CM

## 2023-11-15 DIAGNOSIS — M25512 Pain in left shoulder: Secondary | ICD-10-CM | POA: Diagnosis not present

## 2023-11-15 NOTE — Therapy (Signed)
 OUTPATIENT PHYSICAL THERAPY SHOULDER TREATMENT/ MD NOTE    Patient Name: Alexis Mcneil MRN: [id number] DOB:08-19-1956, 68 y.o., female Today's Date: 11/15/2023  END OF SESSION:  PT End of Session - 11/15/23 1153     Visit Number 12    Number of Visits 16    Date for PT Re-Evaluation 11/26/23    Authorization Type medicare    Authorization - Visit Number 12    Progress Note Due on Visit 20    PT Start Time 1154    PT Stop Time 1243    PT Time Calculation (min) 49 min    Activity Tolerance Patient tolerated treatment well    Behavior During Therapy [facility] for tasks assessed/performed             Past Medical History:  Diagnosis Date   Anemia    Arthritis    KNEE,SHOULDERS   Cataract January 2021   Eye appt visit   Diabetes mellitus without complication (HCC)    GERD (gastroesophageal reflux disease)    Heart murmur    Hyperlipidemia    Hypertension    IFG (impaired fasting glucose)    Neuromuscular disorder (HCC)    NEUROPATHY FEET   OSA (obstructive sleep apnea)    CPAP   Sleep apnea    CPAP   Thyroid  disease    Past Surgical History:  Procedure Laterality Date   COLONOSCOPY     endometrial polyp     January 2014   EYE SURGERY  1988   Cyst on eye removed   mouth palate surgery repair  1961   large tear   ROOT CANEL     2 WEEKS AGO   rt knee surgery arthroscopy  2009   torn meniscus/medial   torn ligament shoulder  1975   repaired   Patient Active Problem List   Diagnosis Date Noted   Primary osteoarthritis of right knee 12/04/2021   CKD (chronic kidney disease) stage 3, GFR 30-59 ml/min (HCC) 11/12/2021   Pre-ulcerative corn or callous 05/20/2020   Excessive sweating 05/20/2020   Gout 03/07/2018   Hereditary and idiopathic peripheral neuropathy 09/24/2015   Trochanteric bursitis of both hips 10/08/2014   Lumbar spinal stenosis 09/06/2014   Right knee pain 09/06/2014   Obstructive sleep apnea 09/26/2010   Left rotator cuff tear 03/18/2010    PARESTHESIA, HANDS 03/18/2010   Diabetes mellitus type 2 in obese 02/07/2008   Hypothyroidism 01/06/2007   Hyperlipidemia 01/06/2007   ANEMIA NOS 01/06/2007   HYPERTENSION, BENIGN ESSENTIAL 01/06/2007    PCP: Dorothyann Byars, MD  REFERRING PROVIDER: Bonner Hair, MD  REFERRING DIAG: [phone] (ICD-10-CM) - S/P arthroscopy of left shoulder Z98.890 (ICD-10-CM) - S/P left rotator cuff repair  THERAPY DIAG:  Acute pain of left shoulder  Muscle weakness (generalized)  Rationale for Evaluation and Treatment: Rehabilitation  ONSET DATE: 08/16/23  SUBJECTIVE:  SUBJECTIVE STATEMENT: The patient reports her neck is tight still-- not as bad as it was over Christmas, but still limited in range. She notes she wants the arm to move further overhead, but it won't go.  Hand dominance: Right  PERTINENT HISTORY: Diabetes, h/o R shoulder injury, HTN, bilat hip bursitis, hypothyroidism  PAIN:  Are you having pain? Yes: NPRS scale: 1/10 Pain location: shoulder  Pain description: soreness Aggravating factors: movement at times Relieving factors: none  PRECAUTIONS: Shoulder  WEIGHT BEARING RESTRICTIONS: follow protocol  FALLS:  Has patient fallen in last 6 months? No  PATIENT GOALS:return to full use of arm.   OBJECTIVE:  Note: Objective measures were completed at Evaluation unless otherwise noted. PATIENT SURVEYS:  FOTO 22% (goal to 68%) POSTURE: Rounded shoulders, forward head UPPER EXTREMITY ROM:  Neck ROM 30 degrees extension, and 20 degrees of lateral SB bilat with pain and rotation to 50 degrees bilat Passive ROM Right eval Left eval Left 10/04/23 LEFT Left 12/16 Left 12/30 Left 11/15/23  Shoulder flexion  90  AAROM with cane 140 AAROM with cane 150 AAROM with cane 145 AAROM (dowel) Supine  150 Standing 115 AAROM 156 degrees  AROM against gravity 95 degrees  Shoulder extension       60 degrees AROM  Shoulder abduction  60 in plane of scapula  80 in plane of scapula 60 in plane of scapula AAROM (dowel) Supine 115 Standing 115 AROM against gravity  95 degrees  Shoulder adduction         Shoulder internal rotation         Shoulder external rotation  38 with arm by side 40 with arm by side AAROM cane   AAROM  (dowel) Supine 50 Standing 45   Elbow flexion  WNLs       Elbow extension  WNLs       Wrist flexion         Wrist extension  Mild limitation 60 deg       Wrist ulnar deviation         Wrist radial deviation         Wrist pronation         Wrist supination         (Blank rows = not tested) UPPER EXTREMITY MMT: NOT TESTED MMT Right eval Left eval  Shoulder flexion  2+/5  Shoulder extension  3+/5  Shoulder abduction  2+/5  Shoulder adduction    Shoulder internal rotation    Shoulder external rotation    Middle trapezius    Lower trapezius    (Blank rows = not tested) PALPATION:  Soreness in anterior pectoralis musculature, and through lateral deltoid    [facility] Adult PT Treatment:                                                DATE: 11/15/2023 Therapeutic Exercise: Pulleys=flexion, scaption x 2 minutes each MMT-- See chart above  Standing AROM flexion -- gets compensatory shoulder shrug (moved to incline AROM to reduce compensatory movements) AROM abduction-- ER with 1# weight x 12 reps R and L Bicep curls 1# weights x 12 reps R and L Shoulder extension into wall x 12 reps Wall lean on elbows with scapular protraction/retraction Supine incline to 30 degrees Flexion x 12 reps Scaption x 12 reps Prone Rows x 12 reps Shoulder  extension x 12 reps ER and IR  Sidelying Shoulder abduction  L 1# weight ER x 12 reps Manual Therapy: Tender lateral border of scapula Modalities: Vaso x 10 minutes    [facility] Adult PT Treatment:                                                 DATE: 11/11/2023 Therapeutic Exercise: Pulleys: flexion & scaption x each, horiz ER x20 Standing against noodle: Shoulder ER 1#DB x10 Shoulder flexion with dowel x10 Low bicep curls 1#DB x12 Scaption arm raises 1#DB x10 L stretch at counter  Small circles with ball on wall  Shoulder extension + YTB x10 Rows + RTB x10 Shoulder IR stretch with strap: horizontal & vertical 5x10 each  Modalities: Vaso (L) shoulder, 34 degrees, med compression x 10 min   [facility] Adult PT Treatment:                                                DATE: 11/08/2023 Therapeutic Exercise: Pulleys: flexion & abduction x each, horiz ER x20 Supine shoulder AAROM with dowel: flexion, abduction, ER Standing:  Shoulder AAROM with dowel: flexion, abduction, ER Low bicep curls 1#DB 2x10 Scaption arm raises 1#DB 2x10 Counter L stretch  Shoulder IR with strap (lateral pull behind) 10x5 Modalities: Vaso (L) shoulder, 34 degrees, med compression x 10 min   [facility] Adult PT Treatment:                                                DATE: 11/01/2023 Therapeutic Exercise: Seated UT/LS stretches Standing against noodle: Scap squeezes AAROM shoulder ER with cane x15 AAROM shoulder flexion with dowel x15 Eccentric low bicep curls + 1#DB x10 Scaption arm raises + 1#DB x10 Manual Therapy: STM cervical paraspinals, cervical upglides, SO, SOT, UT/LS, cervical traction Modalities: Vaso (L) shoulder, 34 degrees, med compression x 10 min   PATIENT EDUCATION: Education details: HEP Person educated: Patient Education method: Programmer, Multimedia, Facilities Manager, and Handouts Education comprehension: verbalized understanding, returned demonstration, and needs further education  HOME EXERCISE PROGRAM: Access Code: [email or web address] URL: [email or web address] Date: 11/01/2023 Prepared by: Lamarr Price  Exercises - Standing Bicep Curls Supinated with Dumbbells  - 1 x daily - 7 x weekly - 3 sets  - 10 reps - Scaption with Dumbbells  - 1 x daily - 7 x weekly - 3 sets - 10 reps - Standing Shoulder Flexion AAROM with Dowel  - 1 x daily - 7 x weekly - 3 sets - 10 reps - Shoulder External Rotation and Scapular Retraction with Resistance  - 1 x daily - 7 x weekly - 3 sets - 10 reps - Standing 'L' Stretch at Counter  - 1 x daily - 7 x weekly - 1 sets - 5-10 reps - 10 sec hold  ASSESSMENT:  CLINICAL IMPRESSION: The patient is progressing and PT is adding resistance training per phase III of protocol. She is tolerating activities well. In standing, she demonstrates shoulder hike when moving against gravity, but can lift when at an incline without compensatory shoulder shrug. PT to continue  to progress per protocol.   OBJECTIVE IMPAIRMENTS: decreased activity tolerance, decreased ROM, decreased strength, hypomobility, increased edema, increased fascial restrictions, impaired flexibility, impaired UE functional use, postural dysfunction, and pain.   GOALS: Goals reviewed with patient? Yes  SHORT TERM GOALS: Target date: 10/27/23  The patient will be indep with HEP within protocol. Baseline: initiated at eval Goal status: MET   2. The patient will improve AAROM and/or PROM to 140 degrees shoulder flexion. Baseline:  see chart above. Goal status: MET     LONG TERM GOALS: Target date: 11/26/2023  The patient will be indep with HEP. Baseline:  Goal status: MET   2.   The patient will improve FOTO score to > or equal to 48% Baseline: 54% Goal status: MET   3. The patient will demonstrate AROM to 120 degrees flexion, 45 degrees ER, and 90 degrees abduction in supine. Baseline: see above  Goal status: MET   4.  The patient  will demo improved upright posture with posterior shoulder girdle engaged to promote improved glenohumeral joint mobility. Baseline: rounded positioning, needs cueing Goal status: IN PROGRESS   5.  The patient will have MMT performed when appropriate in  protocol-- goal to follow. Baseline: not tested Goal status: MET  PLAN:  PT FREQUENCY: 2x/week  PT DURATION: 8 weeks  PLANNED INTERVENTIONS: 97110-Therapeutic exercises, 97530- Therapeutic activity, [id number]- Neuromuscular re-education, 97535- Self Care, 02859- Manual therapy, 97014- Electrical stimulation (unattended), 97016- Vasopneumatic device, 97033- Ionotophoresis 4mg /ml Dexamethasone , Patient/Family education, Taping, Dry Needling, Joint mobilization, Scar mobilization, Cryotherapy, and Moist heat  PLAN FOR NEXT SESSION:  Progress per protocol (Stage 3)  Steffon Gladu, PT 11/15/2023, 12:55 PM

## 2023-11-16 DIAGNOSIS — M19012 Primary osteoarthritis, left shoulder: Secondary | ICD-10-CM | POA: Diagnosis not present

## 2023-11-18 ENCOUNTER — Ambulatory Visit: Payer: Medicare Other

## 2023-11-18 DIAGNOSIS — M25512 Pain in left shoulder: Secondary | ICD-10-CM | POA: Diagnosis not present

## 2023-11-18 DIAGNOSIS — M6281 Muscle weakness (generalized): Secondary | ICD-10-CM

## 2023-11-18 NOTE — Therapy (Signed)
 OUTPATIENT PHYSICAL THERAPY SHOULDER TREATMENT NOTE    Patient Name: Alexis Mcneil MRN: [id number] DOB:11/04/1956, 68 y.o., female Today's Date: 11/18/2023  END OF SESSION:  PT End of Session - 11/18/23 0846     Visit Number 13    Number of Visits 16    Date for PT Re-Evaluation 11/26/23    Authorization Type medicare    Progress Note Due on Visit 20    PT Start Time 0846    PT Stop Time 0942    PT Time Calculation (min) 56 min    Activity Tolerance Patient tolerated treatment well    Behavior During Therapy [facility] for tasks assessed/performed            Past Medical History:  Diagnosis Date   Anemia    Arthritis    KNEE,SHOULDERS   Cataract January 2021   Eye appt visit   Diabetes mellitus without complication (HCC)    GERD (gastroesophageal reflux disease)    Heart murmur    Hyperlipidemia    Hypertension    IFG (impaired fasting glucose)    Neuromuscular disorder (HCC)    NEUROPATHY FEET   OSA (obstructive sleep apnea)    CPAP   Sleep apnea    CPAP   Thyroid  disease    Past Surgical History:  Procedure Laterality Date   COLONOSCOPY     endometrial polyp     January 2014   EYE SURGERY  1988   Cyst on eye removed   mouth palate surgery repair  1961   large tear   ROOT CANEL     2 WEEKS AGO   rt knee surgery arthroscopy  2009   torn meniscus/medial   torn ligament shoulder  1975   repaired   Patient Active Problem List   Diagnosis Date Noted   Primary osteoarthritis of right knee 12/04/2021   CKD (chronic kidney disease) stage 3, GFR 30-59 ml/min (HCC) 11/12/2021   Pre-ulcerative corn or callous 05/20/2020   Excessive sweating 05/20/2020   Gout 03/07/2018   Hereditary and idiopathic peripheral neuropathy 09/24/2015   Trochanteric bursitis of both hips 10/08/2014   Lumbar spinal stenosis 09/06/2014   Right knee pain 09/06/2014   Obstructive sleep apnea 09/26/2010   Left rotator cuff tear 03/18/2010   PARESTHESIA, HANDS 03/18/2010   Diabetes  mellitus type 2 in obese 02/07/2008   Hypothyroidism 01/06/2007   Hyperlipidemia 01/06/2007   ANEMIA NOS 01/06/2007   HYPERTENSION, BENIGN ESSENTIAL 01/06/2007    PCP: Dorothyann Byars, MD  REFERRING PROVIDER: Bonner Hair, MD  REFERRING DIAG: [phone] (ICD-10-CM) - S/P arthroscopy of left shoulder Z98.890 (ICD-10-CM) - S/P left rotator cuff repair  THERAPY DIAG:  Acute pain of left shoulder  Muscle weakness (generalized)  Rationale for Evaluation and Treatment: Rehabilitation  ONSET DATE: 08/16/23  SUBJECTIVE:  SUBJECTIVE STATEMENT: Patient reports she was released by surgeon; states surgeon told her she was stiff in shoulder and gave her a steroid shot in shoulder to help with decrease stiffness. Patient states she was told she no longer had any restrictions Hand dominance: Right  PERTINENT HISTORY: Diabetes, h/o R shoulder injury, HTN, bilat hip bursitis, hypothyroidism  PAIN:  Are you having pain? Yes: NPRS scale: 1/10 Pain location: shoulder  Pain description: soreness Aggravating factors: movement at times Relieving factors: none  PRECAUTIONS: Shoulder  WEIGHT BEARING RESTRICTIONS: follow protocol  FALLS:  Has patient fallen in last 6 months? No  PATIENT GOALS:return to full use of arm.   OBJECTIVE:  Note: Objective measures were completed at Evaluation unless otherwise noted. PATIENT SURVEYS:  FOTO 22% (goal to 68%) POSTURE: Rounded shoulders, forward head UPPER EXTREMITY ROM:  Neck ROM 30 degrees extension, and 20 degrees of lateral SB bilat with pain and rotation to 50 degrees bilat Passive ROM Right eval Left eval Left 10/04/23 LEFT Left 12/16 Left 12/30 Left 11/15/23  Shoulder flexion  90  AAROM with cane 140 AAROM with cane 150 AAROM with cane 145 AAROM  (dowel) Supine 150 Standing 115 AAROM 156 degrees  AROM against gravity 95 degrees  Shoulder extension       60 degrees AROM  Shoulder abduction  60 in plane of scapula  80 in plane of scapula 60 in plane of scapula AAROM (dowel) Supine 115 Standing 115 AROM against gravity  95 degrees  Shoulder adduction         Shoulder internal rotation         Shoulder external rotation  38 with arm by side 40 with arm by side AAROM cane   AAROM  (dowel) Supine 50 Standing 45   Elbow flexion  WNLs       Elbow extension  WNLs       Wrist flexion         Wrist extension  Mild limitation 60 deg       Wrist ulnar deviation         Wrist radial deviation         Wrist pronation         Wrist supination         (Blank rows = not tested) UPPER EXTREMITY MMT: NOT TESTED MMT Right eval Left eval  Shoulder flexion  2+/5  Shoulder extension  3+/5  Shoulder abduction  2+/5  Shoulder adduction    Shoulder internal rotation    Shoulder external rotation    Middle trapezius    Lower trapezius    (Blank rows = not tested) PALPATION:  Soreness in anterior pectoralis musculature, and through lateral deltoid    [facility] Adult PT Treatment:                                                DATE: 11/18/2023 Therapeutic Exercise: Pulleys: flexion & abduction & horiz ER  Rows + GTB 3x10 Doorway pec stretch x30 --> corner pec stretch 2x30 Inclined supine: AAROM shoulder flexion with dowel AAROM shoulder abd with dowel Prone: Rows + 2#DB 2x10 Shoulder extension + 1#DB x12 ER & IR Modalities: Vaso (L) shoulder, 34 deg, med compression x 10 min   [facility] Adult PT Treatment:  DATE: 11/15/2023 Therapeutic Exercise: Pulleys=flexion, scaption x 2 minutes each MMT-- See chart above  Standing AROM flexion -- gets compensatory shoulder shrug (moved to incline AROM to reduce compensatory movements) AROM abduction-- ER with 1# weight x 12 reps R and L Bicep curls  1# weights x 12 reps R and L Shoulder extension into wall x 12 reps Wall lean on elbows with scapular protraction/retraction Supine incline to 30 degrees Flexion x 12 reps Scaption x 12 reps Prone Rows x 12 reps Shoulder extension x 12 reps ER and IR  Sidelying Shoulder abduction  L 1# weight ER x 12 reps Manual Therapy: Tender lateral border of scapula Modalities: Vaso x 10 minutes    [facility] Adult PT Treatment:                                                DATE: 11/11/2023 Therapeutic Exercise: Pulleys: flexion & scaption x each, horiz ER x20 Standing against noodle: Shoulder ER 1#DB x10 Shoulder flexion with dowel x10 Low bicep curls 1#DB x12 Scaption arm raises 1#DB x10 L stretch at counter  Small circles with ball on wall  Shoulder extension + YTB x10 Rows + RTB x10 Shoulder IR stretch with strap: horizontal & vertical 5x10 each  Modalities: Vaso (L) shoulder, 34 degrees, med compression x 10 min   PATIENT EDUCATION: Education details: HEP Person educated: Patient Education method: Programmer, Multimedia, Facilities Manager, and Handouts Education comprehension: verbalized understanding, returned demonstration, and needs further education  HOME EXERCISE PROGRAM: Access Code: [email or web address] URL: [email or web address] Date: 11/01/2023 Prepared by: Lamarr Price  Exercises - Standing Bicep Curls Supinated with Dumbbells  - 1 x daily - 7 x weekly - 3 sets - 10 reps - Scaption with Dumbbells  - 1 x daily - 7 x weekly - 3 sets - 10 reps - Standing Shoulder Flexion AAROM with Dowel  - 1 x daily - 7 x weekly - 3 sets - 10 reps - Shoulder External Rotation and Scapular Retraction with Resistance  - 1 x daily - 7 x weekly - 3 sets - 10 reps - Standing 'L' Stretch at Counter  - 1 x daily - 7 x weekly - 1 sets - 5-10 reps - 10 sec hold  ASSESSMENT:  CLINICAL IMPRESSION:  Tactile cues required to improve posterior shoulder girdle stabilization during standing rows and  prone shoulder extension. Light resistance training progress as tolerated. Improved shoulder flexion and abduction AAROM exhibited since patient received steroid shot at surgeon.   OBJECTIVE IMPAIRMENTS: decreased activity tolerance, decreased ROM, decreased strength, hypomobility, increased edema, increased fascial restrictions, impaired flexibility, impaired UE functional use, postural dysfunction, and pain.   GOALS: Goals reviewed with patient? Yes  SHORT TERM GOALS: Target date: 10/27/23  The patient will be indep with HEP within protocol. Baseline: initiated at eval Goal status: MET   2. The patient will improve AAROM and/or PROM to 140 degrees shoulder flexion. Baseline:  see chart above. Goal status: MET     LONG TERM GOALS: Target date: 11/26/2023  The patient will be indep with HEP. Baseline:  Goal status: MET   2.   The patient will improve FOTO score to > or equal to 48% Baseline: 54% Goal status: MET   3. The patient will demonstrate AROM to 120 degrees flexion, 45 degrees ER, and 90 degrees abduction in supine. Baseline: see above  Goal status: MET   4.  The patient will demo improved upright posture with posterior shoulder girdle engaged to promote improved glenohumeral joint mobility. Baseline: rounded positioning, needs cueing Goal status: IN PROGRESS   5.  The patient will have MMT performed when appropriate in protocol-- goal to follow. Baseline: not tested Goal status: MET  PLAN:  PT FREQUENCY: 2x/week  PT DURATION: 8 weeks  PLANNED INTERVENTIONS: 97110-Therapeutic exercises, 97530- Therapeutic activity, [id number]- Neuromuscular re-education, 97535- Self Care, 02859- Manual therapy, 97014- Electrical stimulation (unattended), 97016- Vasopneumatic device, 97033- Ionotophoresis 4mg /ml Dexamethasone , Patient/Family education, Taping, Dry Needling, Joint mobilization, Scar mobilization, Cryotherapy, and Moist heat  PLAN FOR NEXT SESSION:  Progress AAROM on  incline to standing, resistance training as tolerated, functional shoulder AROM  Lamarr GORMAN Price, PTA 11/18/2023, 9:34 AM

## 2023-11-22 ENCOUNTER — Ambulatory Visit: Payer: Medicare Other

## 2023-11-22 DIAGNOSIS — M6281 Muscle weakness (generalized): Secondary | ICD-10-CM | POA: Diagnosis not present

## 2023-11-22 DIAGNOSIS — M25512 Pain in left shoulder: Secondary | ICD-10-CM | POA: Diagnosis not present

## 2023-11-22 NOTE — Therapy (Signed)
 OUTPATIENT PHYSICAL THERAPY SHOULDER TREATMENT NOTE    Patient Name: Alexis Mcneil MRN: [id number] DOB:[birth year], 68 y.o., female Today's Date: 11/22/2023  END OF SESSION:  PT End of Session - 11/22/23 0846     Visit Number 14    Number of Visits 16    Date for PT Re-Evaluation 11/26/23    Authorization Type medicare    PT Start Time 0846    PT Stop Time 0929    PT Time Calculation (min) 43 min    Activity Tolerance Patient tolerated treatment well    Behavior During Therapy [facility] for tasks assessed/performed            Past Medical History:  Diagnosis Date   Anemia    Arthritis    KNEE,SHOULDERS   Cataract January 2021   Eye appt visit   Diabetes mellitus without complication (HCC)    GERD (gastroesophageal reflux disease)    Heart murmur    Hyperlipidemia    Hypertension    IFG (impaired fasting glucose)    Neuromuscular disorder (HCC)    NEUROPATHY FEET   OSA (obstructive sleep apnea)    CPAP   Sleep apnea    CPAP   Thyroid  disease    Past Surgical History:  Procedure Laterality Date   COLONOSCOPY     endometrial polyp     January 2014   EYE SURGERY  1988   Cyst on eye removed   mouth palate surgery repair  1961   large tear   ROOT CANEL     2 WEEKS AGO   rt knee surgery arthroscopy  2009   torn meniscus/medial   torn ligament shoulder  1975   repaired   Patient Active Problem List   Diagnosis Date Noted   Primary osteoarthritis of right knee 12/04/2021   CKD (chronic kidney disease) stage 3, GFR 30-59 ml/min (HCC) 11/12/2021   Pre-ulcerative corn or callous 05/20/2020   Excessive sweating 05/20/2020   Gout 03/07/2018   Hereditary and idiopathic peripheral neuropathy 09/24/2015   Trochanteric bursitis of both hips 10/08/2014   Lumbar spinal stenosis 09/06/2014   Right knee pain 09/06/2014   Obstructive sleep apnea 09/26/2010   Left rotator cuff tear 03/18/2010   PARESTHESIA, HANDS 03/18/2010   Diabetes mellitus type 2 in obese 02/07/2008    Hypothyroidism 01/06/2007   Hyperlipidemia 01/06/2007   ANEMIA NOS 01/06/2007   HYPERTENSION, BENIGN ESSENTIAL 01/06/2007    PCP: Dorothyann Byars, MD  REFERRING PROVIDER: Bonner Hair, MD  REFERRING DIAG: [id number] (ICD-10-CM) - S/P arthroscopy of left shoulder Z98.890 (ICD-10-CM) - S/P left rotator cuff repair  THERAPY DIAG:  Acute pain of left shoulder  Muscle weakness (generalized)  Rationale for Evaluation and Treatment: Rehabilitation  ONSET DATE: 08/16/23  SUBJECTIVE:  SUBJECTIVE STATEMENT: Patient reports she notices increased range with L shoulder; states it still occasionally pops but no pain.  Hand dominance: Right  PERTINENT HISTORY: Diabetes, h/o R shoulder injury, HTN, bilat hip bursitis, hypothyroidism  PAIN:  Are you having pain? Yes: NPRS scale: 1/10 Pain location: shoulder  Pain description: soreness Aggravating factors: movement at times Relieving factors: none  PRECAUTIONS: Shoulder  WEIGHT BEARING RESTRICTIONS: follow protocol  FALLS:  Has patient fallen in last 6 months? No  PATIENT GOALS:return to full use of arm.   OBJECTIVE:  Note: Objective measures were completed at Evaluation unless otherwise noted. PATIENT SURVEYS:  FOTO 22% (goal to 68%) POSTURE: Rounded shoulders, forward head UPPER EXTREMITY ROM:  Neck ROM 30 degrees extension, and 20 degrees of lateral SB bilat with pain and rotation to 50 degrees bilat Passive ROM Right eval Left eval Left 10/04/23 LEFT Left 12/16 Left 12/30 Left 11/15/23  Shoulder flexion  90  AAROM with cane 140 AAROM with cane 150 AAROM with cane 145 AAROM (dowel) Supine 150 Standing 115 AAROM 156 degrees  AROM against gravity 95 degrees  Shoulder extension       60 degrees AROM  Shoulder abduction  60 in plane of  scapula  80 in plane of scapula 60 in plane of scapula AAROM (dowel) Supine 115 Standing 115 AROM against gravity  95 degrees  Shoulder adduction         Shoulder internal rotation         Shoulder external rotation  38 with arm by side 40 with arm by side AAROM cane   AAROM  (dowel) Supine 50 Standing 45   Elbow flexion  WNLs       Elbow extension  WNLs       Wrist flexion         Wrist extension  Mild limitation 60 deg       Wrist ulnar deviation         Wrist radial deviation         Wrist pronation         Wrist supination         (Blank rows = not tested) UPPER EXTREMITY MMT: NOT TESTED MMT Right eval Left eval  Shoulder flexion  2+/5  Shoulder extension  3+/5  Shoulder abduction  2+/5  Shoulder adduction    Shoulder internal rotation    Shoulder external rotation    Middle trapezius    Lower trapezius    (Blank rows = not tested) PALPATION:  Soreness in anterior pectoralis musculature, and through lateral deltoid     [facility] Adult PT Treatment:                                                DATE: 11/22/2023 Therapeutic Exercise: Pulleys: flexion & abduction & horiz ER x 2 min each Standing: Eccentric low bicep curls + 2#DB 2x10 Bent arm horiz + YTB 2x10 AAROM shoulder flexion + 1#dowel x10 Prone: Rows 3#DB 2x10 Scaption 0# - 1# DB 2x10 Horiz abd + 1#DB 2x10 Standing wall ball circles with 4 ball CW/CCW Corner pec stretch 3x30 Seated with elbow resting on table: shoulder ER/IR AROM    [facility] Adult PT Treatment:  DATE: 11/18/2023 Therapeutic Exercise: Pulleys: flexion & abduction & horiz ER  Rows + GTB 3x10 Doorway pec stretch x30 --> corner pec stretch 2x30 Inclined supine: AAROM shoulder flexion with dowel AAROM shoulder abd with dowel Prone: Rows + 2#DB 2x10 Shoulder extension + 1#DB x12 ER & IR Modalities: Vaso (L) shoulder, 34 deg, med compression x 10 min    [facility] Adult PT Treatment:                                                 DATE: 11/15/2023 Therapeutic Exercise: Pulleys=flexion, scaption x 2 minutes each MMT-- See chart above  Standing AROM flexion -- gets compensatory shoulder shrug (moved to incline AROM to reduce compensatory movements) AROM abduction-- ER with 1# weight x 12 reps R and L Bicep curls 1# weights x 12 reps R and L Shoulder extension into wall x 12 reps Wall lean on elbows with scapular protraction/retraction Supine incline to 30 degrees Flexion x 12 reps Scaption x 12 reps Prone Rows x 12 reps Shoulder extension x 12 reps ER and IR  Sidelying Shoulder abduction  L 1# weight ER x 12 reps Manual Therapy: Tender lateral border of scapula Modalities: Vaso x 10 minutes     PATIENT EDUCATION: Education details: HEP Person educated: Patient Education method: Programmer, Multimedia, Facilities Manager, and Handouts Education comprehension: verbalized understanding, returned demonstration, and needs further education  HOME EXERCISE PROGRAM: Access Code: [email or web address] URL: [email or web address] Date: 11/22/2023 Prepared by: Lamarr Price  Exercises - Standing Bicep Curls Supinated with Dumbbells  - 1 x daily - 7 x weekly - 3 sets - 10 reps - Scaption with Dumbbells  - 1 x daily - 7 x weekly - 3 sets - 10 reps - Standing Shoulder Flexion AAROM with Dowel  - 1 x daily - 7 x weekly - 3 sets - 10 reps - Shoulder External Rotation and Scapular Retraction with Resistance  - 1 x daily - 7 x weekly - 3 sets - 10 reps - Standing 'L' Stretch at Counter  - 1 x daily - 7 x weekly - 1 sets - 5-10 reps - 10 sec hold - Prone Shoulder Row  - 1 x daily - 7 x weekly - 3 sets - 10 reps - Prone Single Arm Shoulder Y with Dumbbell  - 1 x daily - 7 x weekly - 3 sets - 10 reps - Prone Single Arm Shoulder Horizontal Abduction with Dumbbell - Palm Down  - 1 x daily - 7 x weekly - 3 sets - 10 reps - Standing Wall Ball Circles with Plyo Ball  - 1 x daily - 7 x weekly - 3 sets - 10  reps  ASSESSMENT:  CLINICAL IMPRESSION:  HEP updated with shoulder strengthening exercises utilizing light resistance. Tactile cues provided to improve shoulder stabilization and alignment during shoulder ER/IR AROM. Patient is making good progress with POC, having met 4/5 LTGs.   OBJECTIVE IMPAIRMENTS: decreased activity tolerance, decreased ROM, decreased strength, hypomobility, increased edema, increased fascial restrictions, impaired flexibility, impaired UE functional use, postural dysfunction, and pain.   GOALS: Goals reviewed with patient? Yes  SHORT TERM GOALS: Target date: 10/27/23  The patient will be indep with HEP within protocol. Baseline: initiated at eval Goal status: MET   2. The patient will improve AAROM and/or PROM to 140 degrees shoulder flexion. Baseline:  see chart  above. Goal status: MET     LONG TERM GOALS: Target date: 11/26/2023  The patient will be indep with HEP. Baseline:  Goal status: MET   2.   The patient will improve FOTO score to > or equal to 48% Baseline: 54% Goal status: MET   3. The patient will demonstrate AROM to 120 degrees flexion, 45 degrees ER, and 90 degrees abduction in supine. Baseline: see above  Goal status: MET   4.  The patient will demo improved upright posture with posterior shoulder girdle engaged to promote improved glenohumeral joint mobility. Baseline: rounded positioning, needs cueing Goal status: IN PROGRESS   5.  The patient will have MMT performed when appropriate in protocol-- goal to follow. Baseline: not tested Goal status: MET  PLAN:  PT FREQUENCY: 2x/week  PT DURATION: 8 weeks  PLANNED INTERVENTIONS: 97110-Therapeutic exercises, 97530- Therapeutic activity, [id number]- Neuromuscular re-education, 97535- Self Care, [id number]- Manual therapy, 97014- Electrical stimulation (unattended), 97016- Vasopneumatic device, 97033- Ionotophoresis 4mg /ml Dexamethasone , Patient/Family education, Taping, Dry Needling, Joint  mobilization, Scar mobilization, Cryotherapy, and Moist heat  PLAN FOR NEXT SESSION: Review LTGs, progress protocol. Discharge next visit?  Lamarr GORMAN Price, PTA 11/22/2023, 9:29 AM

## 2023-11-24 ENCOUNTER — Encounter: Payer: Self-pay | Admitting: Rehabilitative and Restorative Service Providers"

## 2023-11-24 ENCOUNTER — Ambulatory Visit: Payer: Medicare Other | Admitting: Rehabilitative and Restorative Service Providers"

## 2023-11-24 DIAGNOSIS — M25512 Pain in left shoulder: Secondary | ICD-10-CM

## 2023-11-24 DIAGNOSIS — N1831 Chronic kidney disease, stage 3a: Secondary | ICD-10-CM | POA: Diagnosis not present

## 2023-11-24 DIAGNOSIS — E669 Obesity, unspecified: Secondary | ICD-10-CM | POA: Diagnosis not present

## 2023-11-24 DIAGNOSIS — E1169 Type 2 diabetes mellitus with other specified complication: Secondary | ICD-10-CM | POA: Diagnosis not present

## 2023-11-24 DIAGNOSIS — M6281 Muscle weakness (generalized): Secondary | ICD-10-CM | POA: Diagnosis not present

## 2023-11-24 DIAGNOSIS — E038 Other specified hypothyroidism: Secondary | ICD-10-CM | POA: Diagnosis not present

## 2023-11-24 DIAGNOSIS — I10 Essential (primary) hypertension: Secondary | ICD-10-CM | POA: Diagnosis not present

## 2023-11-24 NOTE — Therapy (Signed)
 OUTPATIENT PHYSICAL THERAPY SHOULDER RE-EVALUATION NOTE    Patient Name: Alexis Mcneil MRN: [id number] DOB:03-30-1956, 68 y.o., female Today's Date: 11/24/2023  END OF SESSION:  PT End of Session - 11/24/23 0959     Visit Number 15    Number of Visits 19    Date for PT Re-Evaluation 12/24/23    Authorization Type medicare    Progress Note Due on Visit 20    PT Start Time 1000    PT Stop Time 1045    PT Time Calculation (min) 45 min    Activity Tolerance Patient tolerated treatment well    Behavior During Therapy [facility] for tasks assessed/performed             Past Medical History:  Diagnosis Date   Anemia    Arthritis    KNEE,SHOULDERS   Cataract January 2021   Eye appt visit   Diabetes mellitus without complication (HCC)    GERD (gastroesophageal reflux disease)    Heart murmur    Hyperlipidemia    Hypertension    IFG (impaired fasting glucose)    Neuromuscular disorder (HCC)    NEUROPATHY FEET   OSA (obstructive sleep apnea)    CPAP   Sleep apnea    CPAP   Thyroid  disease    Past Surgical History:  Procedure Laterality Date   COLONOSCOPY     endometrial polyp     January 2014   EYE SURGERY  1988   Cyst on eye removed   mouth palate surgery repair  1961   large tear   ROOT CANEL     2 WEEKS AGO   rt knee surgery arthroscopy  2009   torn meniscus/medial   torn ligament shoulder  1975   repaired   Patient Active Problem List   Diagnosis Date Noted   Primary osteoarthritis of right knee 12/04/2021   CKD (chronic kidney disease) stage 3, GFR 30-59 ml/min (HCC) 11/12/2021   Pre-ulcerative corn or callous 05/20/2020   Excessive sweating 05/20/2020   Gout 03/07/2018   Hereditary and idiopathic peripheral neuropathy 09/24/2015   Trochanteric bursitis of both hips 10/08/2014   Lumbar spinal stenosis 09/06/2014   Right knee pain 09/06/2014   Obstructive sleep apnea 09/26/2010   Left rotator cuff tear 03/18/2010   PARESTHESIA, HANDS 03/18/2010    Diabetes mellitus type 2 in obese 02/07/2008   Hypothyroidism 01/06/2007   Hyperlipidemia 01/06/2007   ANEMIA NOS 01/06/2007   HYPERTENSION, BENIGN ESSENTIAL 01/06/2007    PCP: Duaine German, MD REFERRING PROVIDER: Grafton Lawrence, MD REFERRING DIAG: [id number] (ICD-10-CM) - S/P arthroscopy of left shoulder Z98.890 (ICD-10-CM) - S/P left rotator cuff repair THERAPY DIAG:  Acute pain of left shoulder  Muscle weakness (generalized)  Rationale for Evaluation and Treatment: Rehabilitation  ONSET DATE: 08/16/23  SUBJECTIVE:  SUBJECTIVE STATEMENT: The patient is continuing to work on exercises. She cannot lift overhead when standing, but notes she can do it in a reclined position. Hand dominance: Right  PERTINENT HISTORY: Diabetes, h/o R shoulder injury, HTN, bilat hip bursitis, hypothyroidism  PAIN:  Are you having pain? Yes: NPRS scale: 1/10 Pain location: shoulder  Pain description: soreness Aggravating factors: movement at times Relieving factors: none  PRECAUTIONS: Shoulder  WEIGHT BEARING RESTRICTIONS: follow protocol  FALLS:  Has patient fallen in last 6 months? No  PATIENT GOALS:return to full use of arm.   OBJECTIVE:  Note: Objective measures were completed at Evaluation unless otherwise noted. PATIENT SURVEYS:  FOTO 22% (goal to 68%) POSTURE: Rounded shoulders, forward head UPPER EXTREMITY ROM:  Neck ROM 30 degrees extension, and 20 degrees of lateral SB bilat with pain and rotation to 50 degrees bilat Passive ROM Right eval Left eval Left 10/04/23 LEFT Left 12/16 Left 12/30 Left 11/15/23 Left 11/24/23  Shoulder flexion  90  AAROM with cane 140 AAROM with cane 150 AAROM with cane 145 AAROM (dowel) Supine 150 Standing 115 AAROM 156 degrees  AROM against gravity 95 degrees  AROM supine 155  AROM against gravity 110   Shoulder extension       60 degrees AROM   Shoulder abduction  60 in plane of scapula  80 in plane of scapula 60 in plane of scapula AAROM (dowel) Supine 115 Standing 115 AROM against gravity  95 degrees   Shoulder adduction          Shoulder internal rotation          Shoulder external rotation  38 with arm by side 40 with arm by side AAROM cane   AAROM  (dowel) Supine 50 Standing 45  Supine AROM 55   Elbow flexion  WNLs        Elbow extension  WNLs        Wrist flexion          Wrist extension  Mild limitation 60 deg        Wrist ulnar deviation          Wrist radial deviation          Wrist pronation          Wrist supination          (Blank rows = not tested) UPPER EXTREMITY MMT: MMT Left   Shoulder flexion 2+/5  Shoulder extension 3+/5  Shoulder abduction 2+/5  Shoulder adduction   Shoulder internal rotation   Shoulder external rotation   Middle trapezius   Lower trapezius   (Blank rows = not tested) PALPATION:  Soreness in anterior pectoralis musculature, and through lateral deltoid     [facility] Adult PT Treatment:                                                DATE: 11/24/23  Therapeutic Exercise: Prone Row with 3# and 5# x 12 reps x 15 reps T with 3# dec'ing to 1# due to compensatory movements x 15 reps Y without weight due to compensatory movements x 10 reps Sidelying ER with 1# x 15 reps Supine Weighted ER 1# x 10 reps Standing AAROM shoulder flexion along door frame x 10 reps Wall slides x 10 reps reducing shoulder shrug Scaption with mirror for cues x 10 reps-- reduced to  no weight to avoid shoulder shrug Bent arm abduction with 3# weight--stopped due to shoulder shrug See AROM chart  [facility] Adult PT Treatment:                                                DATE: 11/22/2023 Therapeutic Exercise: Pulleys: flexion & abduction & horiz ER x 2 min each Standing: Eccentric low bicep curls + 2#DB 2x10 Bent arm  horiz + YTB 2x10 AAROM shoulder flexion + 1#dowel x10 Prone: Rows 3#DB 2x10 Scaption 0# - 1# DB 2x10 Horiz abd + 1#DB 2x10 Standing wall ball circles with 4" ball CW/CCW Corner pec stretch 3x30" Seated with elbow resting on table: shoulder ER/IR AROM    [facility] Adult PT Treatment:                                                DATE: 11/18/2023 Therapeutic Exercise: Pulleys: flexion & abduction & horiz ER  Rows + GTB 3x10 Doorway pec stretch x30" --> corner pec stretch 2x30" Inclined supine: AAROM shoulder flexion with dowel AAROM shoulder abd with dowel Prone: Rows + 2#DB 2x10 Shoulder extension + 1#DB x12 ER & IR Modalities: Vaso (L) shoulder, 34 deg, med compression x 10 min    PATIENT EDUCATION: Education details: HEP Person educated: Patient Education method: Programmer, multimedia, Facilities manager, and Handouts Education comprehension: verbalized understanding, returned demonstration, and needs further education  HOME EXERCISE PROGRAM: Access Code: [email or web address] URL: [email or web address] Date: 11/24/2023 Prepared by: Trygve Gage  Exercises - Standing Shoulder Scaption  - 1 x daily - 7 x weekly - 2-3 sets - 10 reps - Standing Shoulder Flexion AAROM with Dowel  - 1 x daily - 7 x weekly - 2-3 sets - 10 reps - Standing Wall Ball Circles with Plyo Ball  - 1 x daily - 7 x weekly - 1 sets - 2 reps - 30 seconds hold - Doorway Pec Stretch at 90 Degrees Abduction  - 1 x daily - 7 x weekly - 1 sets - 3 reps - 30 seconds hold - Standing 'L' Stretch at [employer]  - 1 x daily - 7 x weekly - 1 sets - 5 reps - 10 sec hold - Prone Shoulder Row  - 1 x daily - 7 x weekly - 2-3 sets - 10 reps - Prone Single Arm Shoulder Horizontal Abduction with Dumbbell - Palm Down  - 1 x daily - 7 x weekly - 2-3 sets - 10 reps - Prone Single Arm Shoulder Y  - 1 x daily - 7 x weekly - 2-3 sets - 10 reps - Sidelying Shoulder ER with Towel and Dumbbell  - 1 x daily - 7 x weekly - 2-3 sets - 10  reps  ASSESSMENT:  CLINICAL IMPRESSION:  The patient tolerated updated HEP well-- PT transitioned to sidelying ER with weight, and discussed strengthening in inclined position to work to improve anti-gravity motion. Patient feels like she can progress on her own with HEP. PT recommended a check in visit in 2 weeks to continue to ensure she gets full anti-gravity motion for overhead activity. She compensates with rib flare and lumbar extension to compensate for dec'd isolated motion. See updated goals for HEP progression and overhead motion.  OBJECTIVE IMPAIRMENTS: decreased activity tolerance, decreased ROM, decreased strength, hypomobility, increased edema, increased fascial restrictions, impaired flexibility, impaired UE functional use, postural dysfunction, and pain.   GOALS: Goals reviewed with patient? Yes  SHORT TERM GOALS: Target date: 10/27/23  The patient will be indep with HEP within protocol. Baseline: initiated at eval Goal status: MET   2. The patient will improve AAROM and/or PROM to 140 degrees shoulder flexion. Baseline:  see chart above. Goal status: MET     LONG TERM GOALS: Target date: 11/26/2023  The patient will be indep with HEP. Baseline:  Goal status: MET   2.   The patient will improve FOTO score to > or equal to 48% Baseline: 54% Goal status: MET   3. The patient will demonstrate AROM to 120 degrees flexion, 45 degrees ER, and 90 degrees abduction in supine. Baseline: see above  Goal status: MET   4.  The patient will demo improved upright posture with posterior shoulder girdle engaged to promote improved glenohumeral joint mobility. Baseline: rounded positioning, needs cueing Goal status: MET   5.  The patient will have MMT performed when appropriate in protocol-- goal to follow. Baseline: not tested Goal status: MET  UPDATED  GOALS  LONG TERM GOALS: Target date: 12/24/23  The patient will be indep with HEP progression. Baseline:  has  comprehensive program Goal status: UPDATED   2.   The patient will demonstrate ability to reach overhead shelf without back extension. Baseline:  compensates Goal status: UPDATED   3. The patient will demonstrate AROM against gravity to 130 degrees flexion. Baseline: 110 Goal status: UPDATED   PLAN:  PT FREQUENCY: 1x/week  PT DURATION: 4 weejs  PLANNED INTERVENTIONS: 97110-Therapeutic exercises, 97530- Therapeutic activity, [id number]- Neuromuscular re-education, 97535- Self Care, [id number]- Manual therapy, 97014- Electrical stimulation (unattended), 97016- Vasopneumatic device, 97033- Ionotophoresis 4mg /ml Dexamethasone , Patient/Family education, Taping, Dry Needling, Joint mobilization, Scar mobilization, Cryotherapy, and Moist heat  PLAN FOR NEXT SESSION: progress isolated anti-gravity motion for functional tasks; d/c when goals met.  Chrles Selley, PT 11/24/2023, 3:11 PM

## 2023-11-25 LAB — TSH: TSH: 0.994 u[IU]/mL (ref 0.450–4.500)

## 2023-11-25 LAB — CMP14+EGFR
ALT: 20 [IU]/L (ref 0–32)
AST: 16 [IU]/L (ref 0–40)
Albumin: 4.1 g/dL (ref 3.9–4.9)
Alkaline Phosphatase: 123 [IU]/L — ABNORMAL HIGH (ref 44–121)
BUN/Creatinine Ratio: 23 (ref 12–28)
BUN: 22 mg/dL (ref 8–27)
Bilirubin Total: 0.5 mg/dL (ref 0.0–1.2)
CO2: 25 mmol/L (ref 20–29)
Calcium: 9.4 mg/dL (ref 8.7–10.3)
Chloride: 99 mmol/L (ref 96–106)
Creatinine, Ser: 0.97 mg/dL (ref 0.57–1.00)
Globulin, Total: 2.1 g/dL (ref 1.5–4.5)
Glucose: 96 mg/dL (ref 70–99)
Potassium: 4.1 mmol/L (ref 3.5–5.2)
Sodium: 140 mmol/L (ref 134–144)
Total Protein: 6.2 g/dL (ref 6.0–8.5)
eGFR: 64 mL/min/{1.73_m2} (ref >59)

## 2023-11-25 NOTE — Progress Notes (Signed)
Hi Alexis Mcneil, kidney function is back under 1.0 which is great.  The alkaline phosphatase is just slightly elevated this is a liver enzyme but not in a worrisome range.  Plus your additional liver enzymes, AST and ALT, are normal.  We will keep an eye on that.  Thyroid level looks perfect.

## 2023-11-28 ENCOUNTER — Other Ambulatory Visit: Payer: Self-pay | Admitting: Family Medicine

## 2023-11-28 DIAGNOSIS — E038 Other specified hypothyroidism: Secondary | ICD-10-CM

## 2023-11-30 ENCOUNTER — Encounter: Payer: Self-pay | Admitting: Family Medicine

## 2023-12-01 ENCOUNTER — Encounter: Payer: Medicare Other | Admitting: Rehabilitative and Restorative Service Providers"

## 2023-12-01 NOTE — Telephone Encounter (Signed)
I see her approval underneath the media tab so looks like we do not really have to do anything on our end except except the medication when it gets delivered.

## 2023-12-06 ENCOUNTER — Encounter: Payer: Self-pay | Admitting: Rehabilitative and Restorative Service Providers"

## 2023-12-06 ENCOUNTER — Ambulatory Visit: Payer: Medicare Other | Admitting: Rehabilitative and Restorative Service Providers"

## 2023-12-06 DIAGNOSIS — M25512 Pain in left shoulder: Secondary | ICD-10-CM

## 2023-12-06 DIAGNOSIS — M6281 Muscle weakness (generalized): Secondary | ICD-10-CM

## 2023-12-06 NOTE — Therapy (Signed)
OUTPATIENT PHYSICAL THERAPY SHOULDER TREATMENT NOTE AND DISCHARGE SUMMARY    Patient Name: Alexis Mcneil MRN: [id number] DOB:[birth year], 68 y.o., female Today's Date: 12/06/2023  PHYSICAL THERAPY DISCHARGE SUMMARY  Visits from Start of Care: 16  Current functional level related to goals / functional outcomes: See current goals   Remaining deficits: Dec'd end range anti-gravity motion Discomfort with sidelying ER-- modified program   Education / Equipment: HEP Self mobilization   Patient agrees to discharge. Patient goals were met. Patient is being discharged due to meeting the stated rehab goals.   END OF SESSION:  PT End of Session - 12/06/23 1003     Visit Number 16    Number of Visits 19    Date for PT Re-Evaluation 12/24/23    Authorization Type medicare    Progress Note Due on Visit 20    PT Start Time 1012    PT Stop Time 1055    PT Time Calculation (min) 43 min    Activity Tolerance Patient tolerated treatment well    Behavior During Therapy [facility] for tasks assessed/performed             Past Medical History:  Diagnosis Date   Anemia    Arthritis    KNEE,SHOULDERS   Cataract January 2021   Eye appt visit   Diabetes mellitus without complication (HCC)    GERD (gastroesophageal reflux disease)    Heart murmur    Hyperlipidemia    Hypertension    IFG (impaired fasting glucose)    Neuromuscular disorder (HCC)    NEUROPATHY FEET   OSA (obstructive sleep apnea)    CPAP   Sleep apnea    CPAP   Thyroid disease    Past Surgical History:  Procedure Laterality Date   COLONOSCOPY     endometrial polyp     January 2014   EYE SURGERY  1988   Cyst on eye removed   mouth palate surgery repair  1961   large tear   ROOT CANEL     2 WEEKS AGO   rt knee surgery arthroscopy  2009   torn meniscus/medial   torn ligament shoulder  1975   repaired   Patient Active Problem List   Diagnosis Date Noted   Primary osteoarthritis of right knee 12/04/2021    CKD (chronic kidney disease) stage 3, GFR 30-59 ml/min (HCC) 11/12/2021   Pre-ulcerative corn or callous 05/20/2020   Excessive sweating 05/20/2020   Gout 03/07/2018   Hereditary and idiopathic peripheral neuropathy 09/24/2015   Trochanteric bursitis of both hips 10/08/2014   Lumbar spinal stenosis 09/06/2014   Right knee pain 09/06/2014   Obstructive sleep apnea 09/26/2010   Left rotator cuff tear 03/18/2010   PARESTHESIA, HANDS 03/18/2010   Diabetes mellitus type 2 in obese 02/07/2008   Hypothyroidism 01/06/2007   Hyperlipidemia 01/06/2007   ANEMIA NOS 01/06/2007   HYPERTENSION, BENIGN ESSENTIAL 01/06/2007    PCP: Nani Gasser, MD REFERRING PROVIDER: Ramond Marrow, MD REFERRING DIAG: Z98.890 (ICD-10-CM) - S/P arthroscopy of left shoulder Z98.890 (ICD-10-CM) - S/P left rotator cuff repair THERAPY DIAG:  Acute pain of left shoulder  Muscle weakness (generalized)  Rationale for Evaluation and Treatment: Rehabilitation  ONSET DATE: 08/16/23  SUBJECTIVE:  SUBJECTIVE STATEMENT: The patient is lifting better overhead. The only exercise she is having difficulty with is the sidelying ER -- she notes some discomfort and catching. She is also noticing some soreness in biceps and attributes it to working out.   PERTINENT HISTORY: Diabetes, h/o R shoulder injury, HTN, bilat hip bursitis, hypothyroidism  PAIN:  Are you having pain? Yes: NPRS scale: 1/10 Pain location: shoulder  Pain description: soreness Aggravating factors: movement at times Relieving factors: none  PRECAUTIONS: Shoulder  WEIGHT BEARING RESTRICTIONS: follow protocol  FALLS:  Has patient fallen in last 6 months? No  PATIENT GOALS:return to full use of arm.   OBJECTIVE:  Note: Objective measures were completed at Evaluation  unless otherwise noted. PATIENT SURVEYS:  FOTO 22% (goal to 68%) POSTURE: Rounded shoulders, forward head UPPER EXTREMITY ROM:  Neck ROM 30 degrees extension, and 20 degrees of lateral SB bilat with pain and rotation to 50 degrees bilat Passive ROM Right eval Left eval Left 10/04/23 LEFT Left 12/16 Left 12/30 Left 11/15/23 Left 11/24/23 Left 12/06/23  Shoulder flexion  90  AAROM with cane 140 AAROM with cane 150 AAROM with cane 145 AAROM (dowel) Supine 150 Standing 115 AAROM 156 degrees  AROM against gravity 95 degrees AROM supine 155  AROM against gravity 110  AROM Supine 158  AROM against gravity 135   Shoulder extension       60 degrees AROM    Shoulder abduction  60 in plane of scapula  80 in plane of scapula 60 in plane of scapula AAROM (dowel) Supine 115 Standing 115 AROM against gravity  95 degrees    Shoulder adduction           Shoulder internal rotation         AROM  To L PSIS  AAROM to sacrum area  Shoulder external rotation  38 with arm by side 40 with arm by side AAROM cane   AAROM  (dowel) Supine 50 Standing 45  Supine AROM 55  Supine AROM 58  Elbow flexion  WNLs         Elbow extension  WNLs         Wrist flexion           Wrist extension  Mild limitation 60 deg         Wrist ulnar deviation           Wrist radial deviation           Wrist pronation           Wrist supination           (Blank rows = not tested) UPPER EXTREMITY MMT: MMT Left   Shoulder flexion 2+/5  Shoulder extension 3+/5  Shoulder abduction 2+/5  Shoulder adduction   Shoulder internal rotation   Shoulder external rotation   Middle trapezius   Lower trapezius   (Blank rows = not tested) PALPATION:  Soreness in anterior pectoralis musculature, and through lateral deltoid     [facility] Adult PT Treatment:                                                DATE: 12/06/23 Therapeutic Exercise: Sidelying ER without weight-- some lateral arm discomfort Supine ER with  dumbbell x 12 reps 1# Standing IR AAROM Overhead reach with AAROM  Chest stretch doorframe Biceps stretch  Seated Biceps stretch Manual Therapy: IASTM and STM bieps and anterior deltoid-- some point tenderness Self massage with ball in door frame for biceps and pec *discussed reducing frequency of doing bicep curls-- does daily while sitting watching shows---may be irritating biceps tendon   [facility] Adult PT Treatment:                                                DATE: 11/24/23  Therapeutic Exercise: Prone Row with 3# and 5# x 12 reps x 15 reps T with 3# dec'ing to 1# due to compensatory movements x 15 reps Y without weight due to compensatory movements x 10 reps Sidelying ER with 1# x 15 reps Supine Weighted ER 1# x 10 reps Standing AAROM shoulder flexion along door frame x 10 reps Wall slides x 10 reps reducing shoulder shrug Scaption with mirror for cues x 10 reps-- reduced to no weight to avoid shoulder shrug Bent arm abduction with 3# weight--stopped due to shoulder shrug See AROM chart  [facility] Adult PT Treatment:                                                DATE: 11/22/2023 Therapeutic Exercise: Pulleys: flexion & abduction & horiz ER x 2 min each Standing: Eccentric low bicep curls + 2#DB 2x10 Bent arm horiz + YTB 2x10 AAROM shoulder flexion + 1#dowel x10 Prone: Rows 3#DB 2x10 Scaption 0# - 1# DB 2x10 Horiz abd + 1#DB 2x10 Standing wall ball circles with 4" ball CW/CCW Corner pec stretch 3x30" Seated with elbow resting on table: shoulder ER/IR AROM    PATIENT EDUCATION: Education details: HEP Person educated: Patient Education method: Programmer, multimedia, Facilities manager, and Handouts Education comprehension: verbalized understanding, returned demonstration, and needs further education  HOME EXERCISE PROGRAM: Access Code: [email or web address] URL: [email or web address] Date: 12/06/2023 Prepared by: Margretta Ditty  Exercises - Doorway Pec Stretch at 90 Degrees  Abduction  - 1 x daily - 7 x weekly - 1 sets - 3 reps - 30 seconds hold - Standing 'L' Stretch at [employer]  - 1 x daily - 7 x weekly - 1 sets - 5 reps - 10 sec hold - Standing Shoulder Internal Rotation AAROM with Dowel  - 2 x daily - 7 x weekly - 1 sets - 10 reps - Standing Bicep Stretch at Wall  - 1 x daily - 7 x weekly - 1 sets - 3 reps - 20 seconds hold - Standing Shoulder Scaption  - 1 x daily - 3 x weekly - 2-3 sets - 10 reps - Standing Shoulder Flexion AAROM with Dowel  - 1 x daily - 3 x weekly - 2-3 sets - 10 reps - Standing Wall Ball Circles with Plyo Ball  - 1 x daily - 3 x weekly - 1 sets - 2 reps - 30 seconds hold - Prone Shoulder Row  - 1 x daily - 3 x weekly - 2-3 sets - 10 reps - Prone Single Arm Shoulder Horizontal Abduction with Dumbbell - Palm Down  - 1 x daily - 3 x weekly - 2-3 sets - 10 reps - Prone Single Arm Shoulder Y  - 1 x daily - 3 x weekly - 2-3 sets - 10 reps -  Supine Shoulder External Rotation with Dumbbell  - 1 x daily - 3 x weekly - 1 sets - 10-12 reps ASSESSMENT:  CLINICAL IMPRESSION:  The patient met 3/3 LTGs. She is now able to isolate flexion over her head without compensation of trunk extension. PT encouraged continued work with HEP for isolated motor control.  The patient has some point tenderness in bicipital groove. We discussed strengthening only 3 days/week to reduce load on tendon. Patient feels she can continue with current HEP post d/c.   OBJECTIVE IMPAIRMENTS: decreased activity tolerance, decreased ROM, decreased strength, hypomobility, increased edema, increased fascial restrictions, impaired flexibility, impaired UE functional use, postural dysfunction, and pain.   GOALS: Goals reviewed with patient? Yes   UPDATED  GOALS  LONG TERM GOALS: Target date: 12/24/23  The patient will be indep with HEP progression. Baseline:  has comprehensive program Goal status: MET    2.   The patient will demonstrate ability to reach overhead shelf without  back extension. Baseline:  compensates Goal status: MET    3. The patient will demonstrate AROM against gravity to 130 degrees flexion. Baseline: 110 Goal status: MET on 12/06/23 with 135 degrees of anti gravity motion into flexion   PLAN:  PT FREQUENCY: 1x/week  PT DURATION: 4 weejs  PLANNED INTERVENTIONS: 97110-Therapeutic exercises, 97530- Therapeutic activity, 97112- Neuromuscular re-education, 97535- Self Care, 16109- Manual therapy, 97014- Electrical stimulation (unattended), 97016- Vasopneumatic device, 97033- Ionotophoresis 4mg /ml Dexamethasone, Patient/Family education, Taping, Dry Needling, Joint mobilization, Scar mobilization, Cryotherapy, and Moist heat  PLAN FOR NEXT SESSION: discharge  Lauralye Kinn, PT 12/06/2023, 10:03 AM

## 2023-12-07 NOTE — Progress Notes (Signed)
   12/07/2023  Patient ID: Alexis Mcneil, female   DOB: 09-Nov-1956, 68 y.o.   MRN: [id number]  Clinic routed request from patient's PCP office to assist patient with Ozempic 2 mg.  It appears a prior authorization was recently submitted and approved by patient's insurance to cover Ozempic; however, she has been receiving this medication from the patient assistance program due to unaffordable co-pay.  I contacted [employer] PAP, and her enrollment has expired.  I am submitting a 2025 reenrollment application online for the patient to be able to continue Ozempic 2 mg through the program.  Patient is aware and currently has a 66-month supply of medication on hand.  I will follow up on the processing status of her application next week and notify her.  Lenna Gilford, PharmD, DPLA

## 2023-12-08 NOTE — Telephone Encounter (Signed)
Alexis Mcneil, RPH  Agapito Games, MD; Chalmers Cater, CMA I was able to get clarification on the patient's Ozempic 2 mg.  It appears a prior authorization was submitted and approved by patient's insurance for the medication, but she gets this through [employer] assistance program.  Her enrollment for this program has expired, but I am submitting a reenrollment application online for 2025.  I contacted the patient to make her aware, and she currently has a 71-month supply on hand; so she is good!

## 2023-12-14 NOTE — Progress Notes (Signed)
   12/14/2023  Patient ID: Alexis Mcneil, female   DOB: 1956-09-05, 68 y.o.   MRN: [id number]  [facility] [employer] patient assistance program, and patient has been approved to continue to receive Ozempic  2 mg weekly through the end of 2025.  Medication shipments are currently delayed, but when I talked to the patient at the end of January she still had a 69-month supply on hand.  Sending patient a MyChart message to inform of the approval, and to schedule a follow up visit in the next few months.  I will also make her aware to reach out to me if she begins to run low on medication and it still has not arrived at Dr. Lynett office.  Channing DELENA Mealing, PharmD, DPLA

## 2024-01-12 ENCOUNTER — Telehealth: Payer: Self-pay

## 2024-01-12 NOTE — Telephone Encounter (Signed)
 Called patient and informed that we have received Ozempic 8mg /66ml - 4 pens From patient assistance  and she can pick this up at her convenience.

## 2024-01-17 NOTE — Telephone Encounter (Signed)
 Patient came by office to get 4 boxes of Ozempic, thanks.

## 2024-02-18 ENCOUNTER — Other Ambulatory Visit: Payer: Self-pay | Admitting: Family Medicine

## 2024-02-18 ENCOUNTER — Other Ambulatory Visit: Payer: Medicare Other

## 2024-02-18 DIAGNOSIS — Z780 Asymptomatic menopausal state: Secondary | ICD-10-CM

## 2024-02-18 DIAGNOSIS — Z0000 Encounter for general adult medical examination without abnormal findings: Secondary | ICD-10-CM

## 2024-02-23 LAB — HM DIABETES EYE EXAM

## 2024-02-24 ENCOUNTER — Other Ambulatory Visit: Payer: Self-pay | Admitting: Family Medicine

## 2024-02-24 DIAGNOSIS — K219 Gastro-esophageal reflux disease without esophagitis: Secondary | ICD-10-CM

## 2024-03-08 ENCOUNTER — Ambulatory Visit (INDEPENDENT_AMBULATORY_CARE_PROVIDER_SITE_OTHER): Payer: Medicare Other | Admitting: Family Medicine

## 2024-03-08 ENCOUNTER — Encounter: Payer: Self-pay | Admitting: Family Medicine

## 2024-03-08 VITALS — BP 120/60 | HR 71 | Ht 65.5 in | Wt 200.0 lb

## 2024-03-08 DIAGNOSIS — Z9181 History of falling: Secondary | ICD-10-CM | POA: Diagnosis not present

## 2024-03-08 DIAGNOSIS — E1169 Type 2 diabetes mellitus with other specified complication: Secondary | ICD-10-CM

## 2024-03-08 DIAGNOSIS — E669 Obesity, unspecified: Secondary | ICD-10-CM | POA: Diagnosis not present

## 2024-03-08 DIAGNOSIS — Z7985 Long-term (current) use of injectable non-insulin antidiabetic drugs: Secondary | ICD-10-CM | POA: Diagnosis not present

## 2024-03-08 DIAGNOSIS — I10 Essential (primary) hypertension: Secondary | ICD-10-CM

## 2024-03-08 DIAGNOSIS — N1831 Chronic kidney disease, stage 3a: Secondary | ICD-10-CM

## 2024-03-08 DIAGNOSIS — Z1211 Encounter for screening for malignant neoplasm of colon: Secondary | ICD-10-CM

## 2024-03-08 LAB — POCT GLYCOSYLATED HEMOGLOBIN (HGB A1C): Hemoglobin A1C: 5.4 % (ref 4.0–5.6)

## 2024-03-08 NOTE — Progress Notes (Signed)
 Established Patient Office Visit  Subjective  Patient ID: Alexis Mcneil, female    DOB: 24-Nov-1955  Age: 68 y.o. MRN: [id number]  Chief Complaint  Patient presents with   Diabetes   Hypertension    HPI  Follow-up for diabetes, hypertension and CKD.  Currently on PAP for Ozempic .  And doing well.  No problems with the medication currently does seem to be helpful.  Weights been stable.  She did let me know about a recent fall off of a retaining wall.  She feels like sometimes her neuropathy can affect her balance.  She feels like she is okay.  She did hit the back of her right shoulder and her head but did not land on her left shoulder which she had just recently had surgery on.  We did do labs in January and last renal function looked fantastic.  Missed DEXA appt but has rescheduled for December.    Needs refer to GI for rpeat colonoscopy.     ROS    Objective:     BP 120/60   Pulse 71   Ht 5' 5.5" (1.664 m)   Wt 200 lb (90.7 kg)   SpO2 100%   BMI 32.78 kg/m    Physical Exam Vitals and nursing note reviewed.  Constitutional:      Appearance: Normal appearance.  HENT:     Head: Normocephalic and atraumatic.  Eyes:     Conjunctiva/sclera: Conjunctivae normal.  Cardiovascular:     Rate and Rhythm: Normal rate and regular rhythm.  Pulmonary:     Effort: Pulmonary effort is normal.     Breath sounds: Normal breath sounds.  Skin:    General: Skin is warm and dry.  Neurological:     Mental Status: She is alert.  Psychiatric:        Mood and Affect: Mood normal.      Results for orders placed or performed in visit on 03/08/24  POCT HgB A1C  Result Value Ref Range   Hemoglobin A1C 5.4 4.0 - 5.6 %   HbA1c POC (<> result, manual entry)     HbA1c, POC (prediabetic range)     HbA1c, POC (controlled diabetic range)        The 10-year ASCVD risk score (Arnett DK, et al., 2019) is: 14.5%    Assessment & Plan:   Problem List Items Addressed This Visit        Cardiovascular and Mediastinum   HYPERTENSION, BENIGN ESSENTIAL - Primary   Well controlled. Continue current regimen. Follow up in  4 mo         Endocrine   Diabetes mellitus type 2 in obese   Your A1C is great at 5.4.  Looks great doin gwell. Keep up the exercise.        Relevant Orders   POCT HgB A1C (Completed)     Genitourinary   CKD (chronic kidney disease) stage 3, GFR 30-59 ml/min (HCC)   Continue to follow renal function Q 6 months.       Other Visit Diagnoses       Screening for colon cancer       Relevant Orders   Ambulatory referral to Gastroenterology     History of recent fall          Recent fall-encouraged her to look into some balance training programs or even learning or doing tai chi it has been shown to help improve balance and reduce falls.  DEXA scheduled for December.  For repeat colonoscopy with Dr. Karene Oto.  Will place official referral for insurance coverage.  Return in about 4 months (around 07/08/2024) for Diabetes follow-up.    Duaine German, MD

## 2024-03-08 NOTE — Assessment & Plan Note (Signed)
 Continue to follow renal function Q 6 months.

## 2024-03-08 NOTE — Assessment & Plan Note (Signed)
 Your A1C is great at 5.4.  Looks great doin gwell. Keep up the exercise.

## 2024-03-08 NOTE — Assessment & Plan Note (Signed)
 Well controlled. Continue current regimen. Follow up in  4 mo

## 2024-03-16 ENCOUNTER — Encounter: Payer: Self-pay | Admitting: Gastroenterology

## 2024-03-17 ENCOUNTER — Other Ambulatory Visit: Payer: Self-pay | Admitting: Family Medicine

## 2024-04-10 DIAGNOSIS — E119 Type 2 diabetes mellitus without complications: Secondary | ICD-10-CM | POA: Diagnosis not present

## 2024-04-10 DIAGNOSIS — H25813 Combined forms of age-related cataract, bilateral: Secondary | ICD-10-CM | POA: Diagnosis not present

## 2024-04-10 DIAGNOSIS — H04123 Dry eye syndrome of bilateral lacrimal glands: Secondary | ICD-10-CM | POA: Diagnosis not present

## 2024-04-13 ENCOUNTER — Ambulatory Visit (AMBULATORY_SURGERY_CENTER)

## 2024-04-13 VITALS — Ht 65.5 in | Wt 197.0 lb

## 2024-04-13 DIAGNOSIS — Z800 Family history of malignant neoplasm of digestive organs: Secondary | ICD-10-CM

## 2024-04-13 DIAGNOSIS — Z860100 Personal history of colon polyps, unspecified: Secondary | ICD-10-CM

## 2024-04-13 MED ORDER — NA SULFATE-K SULFATE-MG SULF 17.5-3.13-1.6 GM/177ML PO SOLN: 1.0000 | Freq: Once | ORAL | 0 refills | Status: AC

## 2024-04-13 NOTE — Patient Instructions (Signed)
 ONCE A WEEK INJECTIONS Ozempic ,  Mounjaro , Wegovy , Trulicity, Tanzeum, Byetta, Victoza, Bydureon, & SymlinPen  -DO NOT TAKE 7 days prior to the procedure.  Last dose on or before Monday 6/23 failure to hold this medication will result in a cancellation or rescheduling of your procedure

## 2024-04-13 NOTE — Progress Notes (Signed)

## 2024-04-18 ENCOUNTER — Telehealth: Payer: Self-pay

## 2024-04-18 NOTE — Telephone Encounter (Signed)
 Forwarding to [place] as an Financial planner.  Tonya  [employer] PAP shipment for Ozempic  2 mg dose / 4 boxes received this morning. Please contact the patient to come and pick up their order today. Placed in the PAP fridge with patient identifier. Thanks in advance.   NDC: 5284-1324-40 LOT: [id number] EXP: 2026-09-08

## 2024-04-27 ENCOUNTER — Other Ambulatory Visit: Payer: Self-pay | Admitting: Family Medicine

## 2024-04-27 DIAGNOSIS — E876 Hypokalemia: Secondary | ICD-10-CM

## 2024-05-01 NOTE — Telephone Encounter (Signed)
 Pt advised.

## 2024-05-05 ENCOUNTER — Encounter: Admitting: Gastroenterology

## 2024-05-09 ENCOUNTER — Ambulatory Visit: Admitting: Gastroenterology

## 2024-05-09 ENCOUNTER — Encounter: Payer: Self-pay | Admitting: Gastroenterology

## 2024-05-09 VITALS — BP 109/61 | HR 70 | Temp 97.3°F | Resp 16 | Ht 65.5 in | Wt 197.0 lb

## 2024-05-09 DIAGNOSIS — D175 Benign lipomatous neoplasm of intra-abdominal organs: Secondary | ICD-10-CM

## 2024-05-09 DIAGNOSIS — K635 Polyp of colon: Secondary | ICD-10-CM | POA: Diagnosis not present

## 2024-05-09 DIAGNOSIS — K5730 Diverticulosis of large intestine without perforation or abscess without bleeding: Secondary | ICD-10-CM

## 2024-05-09 DIAGNOSIS — I10 Essential (primary) hypertension: Secondary | ICD-10-CM | POA: Diagnosis not present

## 2024-05-09 DIAGNOSIS — D120 Benign neoplasm of cecum: Secondary | ICD-10-CM | POA: Diagnosis not present

## 2024-05-09 DIAGNOSIS — K641 Second degree hemorrhoids: Secondary | ICD-10-CM | POA: Diagnosis not present

## 2024-05-09 DIAGNOSIS — G4733 Obstructive sleep apnea (adult) (pediatric): Secondary | ICD-10-CM | POA: Diagnosis not present

## 2024-05-09 DIAGNOSIS — D125 Benign neoplasm of sigmoid colon: Secondary | ICD-10-CM | POA: Diagnosis not present

## 2024-05-09 DIAGNOSIS — Z1211 Encounter for screening for malignant neoplasm of colon: Secondary | ICD-10-CM | POA: Diagnosis not present

## 2024-05-09 DIAGNOSIS — Z860100 Personal history of colon polyps, unspecified: Secondary | ICD-10-CM

## 2024-05-09 DIAGNOSIS — D122 Benign neoplasm of ascending colon: Secondary | ICD-10-CM | POA: Diagnosis not present

## 2024-05-09 DIAGNOSIS — E785 Hyperlipidemia, unspecified: Secondary | ICD-10-CM | POA: Diagnosis not present

## 2024-05-09 DIAGNOSIS — Z860101 Personal history of adenomatous and serrated colon polyps: Secondary | ICD-10-CM | POA: Diagnosis not present

## 2024-05-09 MED ORDER — SODIUM CHLORIDE 0.9 % IV SOLN: 500.0000 mL | Freq: Once | INTRAVENOUS | Status: DC

## 2024-05-09 NOTE — Progress Notes (Signed)
 GASTROENTEROLOGY PROCEDURE H&P NOTE   Primary Care Physician: [name], MD    Reason for Procedure:  Colon polyp surveillance  Plan:    Colonoscopy  Patient is appropriate for endoscopic procedure(s) in the ambulatory ([facility]) setting.  The nature of the procedure, as well as the risks, benefits, and alternatives were carefully and thoroughly reviewed with the patient. Ample time for discussion and questions allowed. The patient understood, was satisfied, and agreed to proceed.     HPI: Alexis Mcneil is a 68 y.o. female who presents for colonoscopy for ongoing colon polyp surveillance and colon cancer screening.  No active GI symptoms.  No known family history of colon cancer or related malignancy.  Patient is otherwise without complaints or active issues today.  Last colonoscopy was 04/2019 and notable for two small 2-3 mm adenomas in the ascending colon and cecum/AO, sigmoid diverticulosis, ascending colon lipoma, and internal hemorrhoids, with recommendation to repeat in 5 years due to family history .   family history of colon cancer in father and paternal grandmother.  Past Medical History:  Diagnosis Date   Anemia    Arthritis    KNEE,SHOULDERS   Cataract January 2021   Eye appt visit   Diabetes mellitus without complication (HCC)    GERD (gastroesophageal reflux disease)    Heart murmur    Hyperlipidemia    Hypertension    IFG (impaired fasting glucose)    Neuromuscular disorder (HCC)    NEUROPATHY FEET   OSA (obstructive sleep apnea)    CPAP   Sleep apnea    CPAP   Thyroid  disease     Past Surgical History:  Procedure Laterality Date   COLONOSCOPY     endometrial polyp     January 2014   EYE SURGERY  1988   Cyst on eye removed   mouth palate surgery repair  1961   large tear   ROOT CANEL     2 WEEKS AGO   rt knee surgery arthroscopy  2009   torn meniscus/medial   torn ligament shoulder  1975   repaired    Prior to Admission  medications   Medication Sig Start Date End Date Taking? Authorizing Provider  allopurinol  (ZYLOPRIM ) 300 MG tablet TAKE ONE-HALF TABLET BY MOUTH  DAILY 10/22/23  Yes [name], MD  AMBULATORY NON FORMULARY MEDICATION Medication Name: CPAP machine and supplies with humidifier.  Please set her CPAP to 9 cm water pressure. Then download after one week so can review her apneas on 9 cm water pressure.  [facility]. Dx. OSA G47.33 12/08/16  Yes [name], MD  atorvastatin  (LIPITOR) 40 MG tablet TAKE 1 TABLET BY MOUTH IN THE  EVENING AT 6 PM 09/08/23  Yes [name], MD  Blood Glucose Monitoring Suppl (FREESTYLE LITE) DEVI  11/06/19  Yes [provider]  chlorthalidone  (HYGROTON ) 50 MG tablet TAKE 2 TABLETS BY MOUTH DAILY 11/12/23  Yes [name], MD  DULoxetine  (CYMBALTA ) 60 MG capsule TAKE 1 CAPSULE BY MOUTH DAILY 11/12/23 11/11/24 Yes [name], MD  glucose blood test strip Freestyle Lite test strips.  FOR CHECKING BLOOD SUGAR ONCE DAILY 07/03/22  Yes [name], MD  Lancets Misc. (ACCU-CHEK FASTCLIX LANCET) KIT Use to check blood sugar once daily. Dx code: E11.69 02/16/17  Yes [name], MD  levothyroxine  (SYNTHROID ) 137 MCG tablet TAKE 1 TABLET BY MOUTH ONCE  DAILY BEFORE BREAKFAST 12/02/23 12/01/24 Yes [name], MD  losartan  (COZAAR ) 100 MG tablet TAKE 1 TABLET BY MOUTH ONCE  DAILY 03/20/24 03/20/25 Yes [name], MD  meloxicam  (MOBIC ) 15 MG tablet TAKE 1 TABLET BY MOUTH ONCE  DAILY 10/22/23 10/21/24 Yes [name], MD  pantoprazole  (PROTONIX ) 40 MG tablet TAKE 1 TABLET BY MOUTH DAILY 02/28/24  Yes [name], MD  potassium chloride  SA (KLOR-CON  M) 20 MEQ tablet Take 2 tablets (40 mEq total) by mouth daily. 04/27/24  Yes [name], MD  ibuprofen  (ADVIL ) 800 MG tablet Take 1 tablet (800 mg total) by mouth every 8 (eight) hours as needed. 07/08/23   Curtis Debby PARAS, MD  Semaglutide , 2 MG/DOSE, 8 MG/3ML SOPN Inject 2 mg as directed once a week. 10/22/22   [name], MD    Current Outpatient Medications  Medication Sig Dispense Refill   allopurinol  (ZYLOPRIM ) 300 MG tablet TAKE ONE-HALF TABLET BY MOUTH  DAILY 45 tablet 3   AMBULATORY NON FORMULARY MEDICATION Medication Name: CPAP machine and supplies with humidifier.  Please set her CPAP to 9 cm water pressure. Then download after one week so can review her apneas on 9 cm water pressure.  [facility]. Dx. OSA G47.33 1 vial 0   atorvastatin  (LIPITOR) 40 MG tablet TAKE 1 TABLET BY MOUTH IN THE  EVENING AT 6 PM 90 tablet 3   Blood Glucose Monitoring Suppl (FREESTYLE LITE) DEVI      chlorthalidone  (HYGROTON ) 50 MG tablet TAKE 2 TABLETS BY MOUTH DAILY 180 tablet 3   DULoxetine  (CYMBALTA ) 60 MG capsule TAKE 1 CAPSULE BY MOUTH DAILY 90 capsule 3   glucose blood test strip Freestyle Lite test strips.  FOR CHECKING BLOOD SUGAR ONCE DAILY 100 each PRN   Lancets Misc. (ACCU-CHEK FASTCLIX LANCET) KIT Use to check blood sugar once daily. Dx code: E11.69 1 kit 1   levothyroxine  (SYNTHROID ) 137 MCG tablet TAKE 1 TABLET BY MOUTH ONCE  DAILY BEFORE BREAKFAST 90 tablet 3   losartan  (COZAAR ) 100 MG tablet TAKE 1 TABLET BY MOUTH ONCE  DAILY 90 tablet 1   meloxicam  (MOBIC ) 15 MG tablet TAKE 1 TABLET BY MOUTH ONCE  DAILY 90 tablet 3   pantoprazole  (PROTONIX ) 40 MG tablet TAKE 1 TABLET BY MOUTH DAILY 90 tablet 3   potassium chloride  SA (KLOR-CON  M) 20 MEQ tablet Take 2 tablets (40 mEq total) by mouth daily. 90 tablet 3   ibuprofen  (ADVIL ) 800 MG tablet Take 1 tablet (800 mg total) by mouth every 8 (eight) hours as needed. 90 tablet 2   Semaglutide , 2 MG/DOSE, 8 MG/3ML SOPN Inject 2 mg as directed once a week. 9 mL 2   Current Facility-Administered Medications  Medication Dose Route Frequency Provider Last Rate Last Admin   0.9 %  sodium chloride  infusion  500 mL Intravenous Once Rowen Hur V, DO         Allergies as of 05/09/2024 - Review Complete 05/09/2024  Allergen Reaction Noted   Gabapentin  Other (See Comments) 01/10/2015   Niacin Other (See Comments) 04/13/2024   Niacin and related Nausea And Vomiting 10/31/2012    Family History  Problem Relation Age of Onset   Colon cancer Father 75       colon/ polyps   Heart attack Father    Stroke Father    Cancer Father    Diabetes Father    Hyperlipidemia Mother        Living   Hypertension Mother    Arthritis Mother  Colon cancer Paternal Grandmother    Stroke Paternal Grandmother    Heart attack Paternal Grandmother        heart attack   Cancer Paternal Grandmother    Diabetes Paternal Grandmother    Diabetes Other        grandmother   Supraventricular tachycardia Sister    Hyperlipidemia Maternal Grandmother    Varicose Veins Maternal Grandmother    Cancer Paternal Aunt    Cancer Paternal Uncle    Esophageal cancer Neg Hx    Stomach cancer Neg Hx    Rectal cancer Neg Hx    Breast cancer Neg Hx     Social History   Socioeconomic History   Marital status: Single    Spouse name: Not on file   Number of children: Not on file   Years of education: 13   Highest education level: Associate degree: academic program  Occupational History   Occupation: Retired.  Tobacco Use   Smoking status: Never   Smokeless tobacco: Never  Vaping Use   Vaping status: Never Used  Substance and Sexual Activity   Alcohol use: Yes    Alcohol/week: 2.0 standard drinks of alcohol    Types: 2 Cans of beer per week    Comment: occasional beer on the weekends.     Drug use: No   Sexual activity: Not Currently    Birth control/protection: Post-menopausal, None  Other Topics Concern   Not on file  Social History Narrative   Lives alone. Has no children. Education: associates degree. Enjoys doing yard work.   Social Drivers of Corporate investment banker Strain: Low Risk  (11/04/2023)   Overall Financial Resource Strain  (CARDIA)    Difficulty of Paying Living Expenses: Not hard at all  Food Insecurity: No Food Insecurity (11/04/2023)   Hunger Vital Sign    Worried About Running Out of Food in the Last Year: Never true    Ran Out of Food in the Last Year: Never true  Transportation Needs: No Transportation Needs (11/04/2023)   PRAPARE - Administrator, Civil Service (Medical): No    Lack of Transportation (Non-Medical): No  Physical Activity: Insufficiently Active (08/04/2023)   Exercise Vital Sign    Days of Exercise per Week: 2 days    Minutes of Exercise per Session: 20 min  Stress: No Stress Concern Present (08/04/2023)   [employer] of Occupational Health - Occupational Stress Questionnaire    Feeling of Stress : Not at all  Recent Concern: Stress - Stress Concern Present (06/09/2023)   [employer] of Occupational Health - Occupational Stress Questionnaire    Feeling of Stress : To some extent  Social Connections: Unknown (11/04/2023)   Social Connection and Isolation Panel    Frequency of Communication with Friends and Family: Once a week    Frequency of Social Gatherings with Friends and Family: Once a week    Attends Religious Services: Patient declined    Database administrator or Organizations: No    Attends Banker Meetings: Never    Marital Status: Never married  Intimate Partner Violence: Not At Risk (08/04/2023)   Humiliation, Afraid, Rape, and Kick questionnaire    Fear of Current or Ex-Partner: No    Emotionally Abused: No    Physically Abused: No    Sexually Abused: No    Physical Exam: Vital signs in last 24 hours: @BP  125/83   Pulse 78   Temp (!) 97.3 F (36.3  C)   Resp 17   Ht 5' 5.5 (1.664 m)   Wt 197 lb (89.4 kg)   SpO2 97%   BMI 32.28 kg/m  GEN: NAD EYE: Sclerae anicteric ENT: MMM CV: Non-tachycardic Pulm: CTA b/l GI: Soft, NT/ND NEURO:  Alert & Oriented x 3   Sandor Flatter, DO  Gastroenterology   05/09/2024  8:41 AM

## 2024-05-09 NOTE — Patient Instructions (Signed)
 Resume previous diet and medications. Awaiting pathology results. Repeat Colonoscopy based on pathology results. Handout provided on colon polyps.  YOU HAD AN ENDOSCOPIC PROCEDURE TODAY AT THE [facility]:   Refer to the procedure report that was given to you for any specific questions about what was found during the examination.  If the procedure report does not answer your questions, please call your gastroenterologist to clarify.  If you requested that your care partner not be given the details of your procedure findings, then the procedure report has been included in a sealed envelope for you to review at your convenience later.  YOU SHOULD EXPECT: Some feelings of bloating in the abdomen. Passage of more gas than usual.  Walking can help get rid of the air that was put into your GI tract during the procedure and reduce the bloating. If you had a lower endoscopy (such as a colonoscopy or flexible sigmoidoscopy) you may notice spotting of blood in your stool or on the toilet paper. If you underwent a bowel prep for your procedure, you may not have a normal bowel movement for a few days.  Please Note:  You might notice some irritation and congestion in your nose or some drainage.  This is from the oxygen used during your procedure.  There is no need for concern and it should clear up in a day or so.  SYMPTOMS TO REPORT IMMEDIATELY:  Following lower endoscopy (colonoscopy or flexible sigmoidoscopy):  Excessive amounts of blood in the stool  Significant tenderness or worsening of abdominal pains  Swelling of the abdomen that is new, acute  Fever of 100F or higher  For urgent or emergent issues, a gastroenterologist can be reached at any hour by calling [phone]. Do not use MyChart messaging for urgent concerns.    DIET:  We do recommend a small meal at first, but then you may proceed to your regular diet.  Drink plenty of fluids but you should avoid alcoholic beverages for 24  hours.  ACTIVITY:  You should plan to take it easy for the rest of today and you should NOT DRIVE or use heavy machinery until tomorrow (because of the sedation medicines used during the test).    FOLLOW UP: Our staff will call the number listed on your records the next business day following your procedure.  We will call around 7:15- 8:00 am to check on you and address any questions or concerns that you may have regarding the information given to you following your procedure. If we do not reach you, we will leave a message.     If any biopsies were taken you will be contacted by phone or by letter within the next 1-3 weeks.  Please call us  at [phone] if you have not heard about the biopsies in 3 weeks.    SIGNATURES/CONFIDENTIALITY: You and/or your care partner have signed paperwork which will be entered into your electronic medical record.  These signatures attest to the fact that that the information above on your After Visit Summary has been reviewed and is understood.  Full responsibility of the confidentiality of this discharge information lies with you and/or your care-partner.

## 2024-05-09 NOTE — Progress Notes (Signed)
 Called to room to assist during endoscopic procedure.  Patient ID and intended procedure confirmed with present staff. Received instructions for my participation in the procedure from the performing physician.

## 2024-05-09 NOTE — Progress Notes (Signed)
 Pt's states no medical or surgical changes since previsit or office visit.

## 2024-05-09 NOTE — Progress Notes (Signed)
 Report to PACU, RN, vss, BBS= Clear.

## 2024-05-09 NOTE — Op Note (Signed)
 [facility] Patient Name: Alexis Mcneil Procedure Date: 05/09/2024 8:39 AM MRN: [id number] Endoscopist: Sandor Flatter , MD, [id number] Age: 68 Referring MD:  Date of Birth: 1956-07-03 Gender: Female Account #: [id number] Procedure:                Colonoscopy Indications:              Surveillance: Personal history of adenomatous                            polyps on last colonoscopy 5 years ago                           Last colonoscopy was 04/2019 and notable for two                            small 2-3 mm adenomas in the ascending colon and                            cecum/AO, sigmoid diverticulosis, ascending colon                            lipoma, and internal hemorrhoids, with                            recommendation to repeat in 5 years due to family                            history .                           family history of colon cancer in father and                            paternal grandmother. Medicines:                Monitored Anesthesia Care Procedure:                Pre-Anesthesia Assessment:                           - Prior to the procedure, a History and Physical                            was performed, and patient medications and                            allergies were reviewed. The patient's tolerance of                            previous anesthesia was also reviewed. The risks                            and benefits of the procedure and the sedation  options and risks were discussed with the patient.                            All questions were answered, and informed consent                            was obtained. Prior Anticoagulants: The patient has                            taken no anticoagulant or antiplatelet agents. ASA                            Grade Assessment: II - A patient with mild systemic                            disease. After reviewing the risks and benefits,                            the  patient was deemed in satisfactory condition to                            undergo the procedure.                           After obtaining informed consent, the colonoscope                            was passed under direct vision. Throughout the                            procedure, the patient's blood pressure, pulse, and                            oxygen saturations were monitored continuously. The                            CF [id number] #[id number] was introduced through the anus                            and advanced to the the cecum, identified by                            appendiceal orifice and ileocecal valve. The                            colonoscopy was performed without difficulty. The                            patient tolerated the procedure well. The quality                            of the bowel preparation was good. The ileocecal  valve, appendiceal orifice, and rectum were                            photographed. Scope In: 8:49:12 AM Scope Out: 9:05:56 AM Scope Withdrawal Time: 0 hours 13 minutes 36 seconds  Total Procedure Duration: 0 hours 16 minutes 44 seconds  Findings:                 The perianal and digital rectal examinations were                            normal.                           Three sessile polyps were found in the ascending                            colon and cecum. The polyps were 3 to 5 mm in size.                            These polyps were removed with a cold snare.                            Resection and retrieval were complete. Estimated                            blood loss was minimal.                           A 3 mm polyp was found in the sigmoid colon. The                            polyp was sessile. The polyp was removed with a                            cold snare. Resection and retrieval were complete.                            Estimated blood loss was minimal.                           There was a small  lipoma, in the ascending colon.                           A few small-mouthed diverticula were found in the                            sigmoid colon.                           Non-bleeding internal hemorrhoids were found during                            retroflexion. The hemorrhoids were small and Grade  II (internal hemorrhoids that prolapse but reduce                            spontaneously). Complications:            No immediate complications. Estimated Blood Loss:     Estimated blood loss was minimal. Impression:               - Three 3 to 5 mm polyps in the ascending colon and                            in the cecum, removed with a cold snare. Resected                            and retrieved.                           - One 3 mm polyp in the sigmoid colon, removed with                            a cold snare. Resected and retrieved.                           - Small lipoma in the ascending colon.                           - Diverticulosis in the sigmoid colon.                           - Non-bleeding internal hemorrhoids. Recommendation:           - Patient has a contact number available for                            emergencies. The signs and symptoms of potential                            delayed complications were discussed with the                            patient. Return to normal activities tomorrow.                            Written discharge instructions were provided to the                            patient.                           - Resume previous diet.                           - Resume previous diet.                           - Continue present medications.                           -  Await pathology results.                           - Repeat colonoscopy in 3 - 5 years for                            surveillance based on pathology results and due to                            family history.                           - Return to  GI office PRN. Sandor Flatter, MD 05/09/2024 9:11:17 AM

## 2024-05-10 ENCOUNTER — Telehealth: Payer: Self-pay

## 2024-05-10 NOTE — Telephone Encounter (Signed)
  Follow up Call-     05/09/2024    7:44 AM  Call back number  Post procedure Call Back phone  # [phone]-  Permission to leave phone message Yes     Patient questions:  Do you have a fever, pain , or abdominal swelling? No. Pain Score  0 *  Have you tolerated food without any problems? Yes.    Have you been able to return to your normal activities? Yes.    Do you have any questions about your discharge instructions: Diet   No. Medications  No. Follow up visit  No.  Do you have questions or concerns about your Care? No.  Actions: * If pain score is 4 or above: No action needed, pain <4.

## 2024-05-11 LAB — SURGICAL PATHOLOGY

## 2024-05-16 ENCOUNTER — Ambulatory Visit: Payer: Self-pay | Admitting: Gastroenterology

## 2024-05-16 DIAGNOSIS — H52222 Regular astigmatism, left eye: Secondary | ICD-10-CM | POA: Diagnosis not present

## 2024-05-16 DIAGNOSIS — H25812 Combined forms of age-related cataract, left eye: Secondary | ICD-10-CM | POA: Diagnosis not present

## 2024-05-16 DIAGNOSIS — N183 Chronic kidney disease, stage 3 unspecified: Secondary | ICD-10-CM | POA: Diagnosis not present

## 2024-05-16 DIAGNOSIS — Z7985 Long-term (current) use of injectable non-insulin antidiabetic drugs: Secondary | ICD-10-CM | POA: Diagnosis not present

## 2024-05-16 DIAGNOSIS — K219 Gastro-esophageal reflux disease without esophagitis: Secondary | ICD-10-CM | POA: Diagnosis not present

## 2024-05-16 DIAGNOSIS — E669 Obesity, unspecified: Secondary | ICD-10-CM | POA: Diagnosis not present

## 2024-05-16 DIAGNOSIS — E039 Hypothyroidism, unspecified: Secondary | ICD-10-CM | POA: Diagnosis not present

## 2024-05-16 DIAGNOSIS — E1136 Type 2 diabetes mellitus with diabetic cataract: Secondary | ICD-10-CM | POA: Diagnosis not present

## 2024-05-16 DIAGNOSIS — Z79899 Other long term (current) drug therapy: Secondary | ICD-10-CM | POA: Diagnosis not present

## 2024-05-16 DIAGNOSIS — N1831 Chronic kidney disease, stage 3a: Secondary | ICD-10-CM | POA: Diagnosis not present

## 2024-05-16 DIAGNOSIS — Z6832 Body mass index (BMI) 32.0-32.9, adult: Secondary | ICD-10-CM | POA: Diagnosis not present

## 2024-05-16 DIAGNOSIS — E1122 Type 2 diabetes mellitus with diabetic chronic kidney disease: Secondary | ICD-10-CM | POA: Diagnosis not present

## 2024-05-16 DIAGNOSIS — I129 Hypertensive chronic kidney disease with stage 1 through stage 4 chronic kidney disease, or unspecified chronic kidney disease: Secondary | ICD-10-CM | POA: Diagnosis not present

## 2024-06-13 DIAGNOSIS — E1136 Type 2 diabetes mellitus with diabetic cataract: Secondary | ICD-10-CM | POA: Diagnosis not present

## 2024-06-13 DIAGNOSIS — Z79899 Other long term (current) drug therapy: Secondary | ICD-10-CM | POA: Diagnosis not present

## 2024-06-13 DIAGNOSIS — H5213 Myopia, bilateral: Secondary | ICD-10-CM | POA: Diagnosis not present

## 2024-06-13 DIAGNOSIS — H25811 Combined forms of age-related cataract, right eye: Secondary | ICD-10-CM | POA: Diagnosis not present

## 2024-06-13 DIAGNOSIS — E1122 Type 2 diabetes mellitus with diabetic chronic kidney disease: Secondary | ICD-10-CM | POA: Diagnosis not present

## 2024-06-13 DIAGNOSIS — N183 Chronic kidney disease, stage 3 unspecified: Secondary | ICD-10-CM | POA: Diagnosis not present

## 2024-06-13 DIAGNOSIS — H52223 Regular astigmatism, bilateral: Secondary | ICD-10-CM | POA: Diagnosis not present

## 2024-06-13 DIAGNOSIS — E039 Hypothyroidism, unspecified: Secondary | ICD-10-CM | POA: Diagnosis not present

## 2024-06-13 DIAGNOSIS — H52221 Regular astigmatism, right eye: Secondary | ICD-10-CM | POA: Diagnosis not present

## 2024-06-13 DIAGNOSIS — K219 Gastro-esophageal reflux disease without esophagitis: Secondary | ICD-10-CM | POA: Diagnosis not present

## 2024-06-13 DIAGNOSIS — H04123 Dry eye syndrome of bilateral lacrimal glands: Secondary | ICD-10-CM | POA: Diagnosis not present

## 2024-06-13 DIAGNOSIS — I129 Hypertensive chronic kidney disease with stage 1 through stage 4 chronic kidney disease, or unspecified chronic kidney disease: Secondary | ICD-10-CM | POA: Diagnosis not present

## 2024-06-13 DIAGNOSIS — E669 Obesity, unspecified: Secondary | ICD-10-CM | POA: Diagnosis not present

## 2024-06-14 ENCOUNTER — Other Ambulatory Visit

## 2024-07-04 ENCOUNTER — Other Ambulatory Visit: Payer: Self-pay

## 2024-07-04 ENCOUNTER — Ambulatory Visit (INDEPENDENT_AMBULATORY_CARE_PROVIDER_SITE_OTHER)

## 2024-07-04 VITALS — BP 118/70 | Ht 65.5 in | Wt 197.0 lb

## 2024-07-04 DIAGNOSIS — M25562 Pain in left knee: Secondary | ICD-10-CM

## 2024-07-04 DIAGNOSIS — M25462 Effusion, left knee: Secondary | ICD-10-CM | POA: Diagnosis not present

## 2024-07-04 NOTE — Progress Notes (Signed)
   Subjective:    Patient ID: Alexis Mcneil, female    DOB: 68 y.o., July 27, 1956   MRN: [id number]  HPI  Chief Complaint: L knee pain  Left leg pain Mostly behind knee and down below this Past 8 days Mobic  15mg  daily already on board for her R knee (s/p surgery) Occasionally switches to ibuprofen  800mg  with mild relief Increase seafood intake [place]. Still taking her allopurinol  as prescribed. No significant changes in alcohol or red meat consumption. Does have history of CKD 3 No known inciting injury that she can recall. Right knee feels fine at the moment No chest pain, shortness of breath, recent significant travel.  No prior history of blood clot.    Objective:   Physical Exam Vitals:   07/04/24 1331  BP: 118/70    Left knee (compared to normal) -Inspection: Visible effusion present.  Positive fluid wave.  No overlying erythema.  Minimally warmer than contralateral side. - Palpation: TTP+ lateral joint line.  Equivocal medial joint line tenderness.   Limited US  Examination of the left knee: -Extensor mechanism, including quadriceps tendon, patella, and patellar tendon is normal without any signs of tear, bursal enlargement, or cortical irregularity. -Suprapatellar pouch is greatly enlarged with hypoechogenic fluid signal   Impression: - Large effusion of the left knee  Ultrasound examination performed and interpreted by Redell Robes, DO   Intra-articular Knee Aspiration/Injection Procedure Note Alexis Mcneil [birth year] 07/04/2024 Procedure: Large Joint Aspiration/Injection of Knee, Intra-articular with Ultrasound Indications: Pain  Procedure Details Following the description of risks including infection, bleeding, damage to surrounding structures, patient provided written consent for left knee joint injection procedure. US  was used to identify the suprapatellar pouch. Patient was sterilely prepped in the usual fashion with alcohol.   Following topical anesthetization with ethyl chloride, a tract was numbed with a solution of 0.25 mL sodium bicarbonate 8.6% and 3 mL lidocaine  1%.  An 18-gauge was then used to cannulate the suprapatellar pouch and 50 cc of cloudy, serous colored fluid was produced.  This was well visualized under ultrasound, please see associated photographic documentation. Patient tolerated well without complication.  Precautions provided. Cleaned and dressing applied.  This joint fluid was then sent for fluid analysis.     Assessment & Plan:   Very pleasant 68 year old female with past medical history significant for gout presenting with acute onset pain and swelling in her left knee in the context of increased seafood intake highly suspicious for a gout flare.  Ultrasonography of her left knee revealed a significant effusion which I recommended draining today and the patient consented to.  I will send fluid off for analysis to evaluate whether or not a gout versus pseudogout versus generically inflammatory infiltrate versus infectious infiltrate is present.  I will let Anani know the results of these tests as soon as we get them back.  Greater than 30 minutes was spent eliciting history from the patient, examining the patient, counseling on treatment options, and conducting the aforementioned procedures.

## 2024-07-04 NOTE — Addendum Note (Signed)
 Addended by: MARCINE HARLENE SAILOR on: 07/04/2024 05:28 PM   Modules accepted: Orders

## 2024-07-04 NOTE — Addendum Note (Signed)
 Addended by: CHARLES ROGUE A on: 07/04/2024 05:16 PM   Modules accepted: Orders

## 2024-07-05 ENCOUNTER — Ambulatory Visit (INDEPENDENT_AMBULATORY_CARE_PROVIDER_SITE_OTHER)

## 2024-07-05 ENCOUNTER — Ambulatory Visit: Admitting: Sports Medicine

## 2024-07-05 ENCOUNTER — Ambulatory Visit: Payer: Self-pay | Admitting: Family Medicine

## 2024-07-05 DIAGNOSIS — Z780 Asymptomatic menopausal state: Secondary | ICD-10-CM

## 2024-07-05 DIAGNOSIS — Z0000 Encounter for general adult medical examination without abnormal findings: Secondary | ICD-10-CM

## 2024-07-05 NOTE — Progress Notes (Signed)
 HI Alexis Mcneil, your bone density shows T score of 0.2 so bone density is normal. Recommend repeat in 5 years.

## 2024-07-06 ENCOUNTER — Ambulatory Visit: Payer: Self-pay

## 2024-07-06 ENCOUNTER — Encounter: Payer: Self-pay | Admitting: Family Medicine

## 2024-07-07 ENCOUNTER — Other Ambulatory Visit (INDEPENDENT_AMBULATORY_CARE_PROVIDER_SITE_OTHER)

## 2024-07-07 DIAGNOSIS — M1120 Other chondrocalcinosis, unspecified site: Secondary | ICD-10-CM

## 2024-07-07 DIAGNOSIS — M25562 Pain in left knee: Secondary | ICD-10-CM

## 2024-07-07 MED ORDER — COLCHICINE 0.6 MG PO TABS: ORAL_TABLET | ORAL | 0 refills | Status: DC

## 2024-07-07 NOTE — Progress Notes (Signed)
 Colchicine  prescribed for CPPD flare.

## 2024-07-10 LAB — CELL COUNT + DIFF, W/O CRYST-SYNVL FLD
Basophils, %: 0 %
Eosinophils-Synovial: 0 % (ref 0–2)
Lymphocytes-Synovial Fld: 12 % (ref 0–74)
Monocyte/Macrophage: 1 % (ref 0–69)
Neutrophil, Synovial: 87 % — ABNORMAL HIGH (ref 0–24)
Synoviocytes, %: 0 % (ref 0–15)
WBC, Synovial: 2740 {cells}/uL — ABNORMAL HIGH (ref <150)

## 2024-07-10 LAB — ANAEROBIC AND AEROBIC CULTURE
AER RESULT:: NO GROWTH
MICRO NUMBER:: 16889274
MICRO NUMBER:: 16889275
SPECIMEN QUALITY:: ADEQUATE
SPECIMEN QUALITY:: ADEQUATE

## 2024-07-10 LAB — SYNOVIAL FLUID, CRYSTAL

## 2024-07-11 ENCOUNTER — Encounter: Payer: Self-pay | Admitting: Sports Medicine

## 2024-07-11 ENCOUNTER — Ambulatory Visit (INDEPENDENT_AMBULATORY_CARE_PROVIDER_SITE_OTHER): Admitting: Family Medicine

## 2024-07-11 ENCOUNTER — Telehealth: Payer: Self-pay | Admitting: Family Medicine

## 2024-07-11 ENCOUNTER — Encounter: Payer: Self-pay | Admitting: Family Medicine

## 2024-07-11 VITALS — BP 113/63 | HR 89 | Ht 65.5 in | Wt 195.1 lb

## 2024-07-11 DIAGNOSIS — I10 Essential (primary) hypertension: Secondary | ICD-10-CM

## 2024-07-11 DIAGNOSIS — M25562 Pain in left knee: Secondary | ICD-10-CM

## 2024-07-11 DIAGNOSIS — E1169 Type 2 diabetes mellitus with other specified complication: Secondary | ICD-10-CM | POA: Diagnosis not present

## 2024-07-11 DIAGNOSIS — E669 Obesity, unspecified: Secondary | ICD-10-CM | POA: Diagnosis not present

## 2024-07-11 DIAGNOSIS — M5432 Sciatica, left side: Secondary | ICD-10-CM

## 2024-07-11 DIAGNOSIS — Z23 Encounter for immunization: Secondary | ICD-10-CM | POA: Diagnosis not present

## 2024-07-11 LAB — POCT GLYCOSYLATED HEMOGLOBIN (HGB A1C): Hemoglobin A1C: 5.7 % — AB (ref 4.0–5.6)

## 2024-07-11 MED ORDER — TRAMADOL HCL 50 MG PO TABS: 50.0000 mg | ORAL_TABLET | Freq: Three times a day (TID) | ORAL | 0 refills | Status: AC | PRN

## 2024-07-11 MED ORDER — CHLORTHALIDONE 50 MG PO TABS: 50.0000 mg | ORAL_TABLET | Freq: Every day | ORAL | Status: DC

## 2024-07-11 NOTE — Telephone Encounter (Signed)
 Please call pt and let her know that her BP have been on lower end so recommend dec the chlorthalidone  to once a day. I think the weight loss has caused bp pto be lower.

## 2024-07-11 NOTE — Telephone Encounter (Signed)
 Attempted call to patient and left a detailed voice mail message on listed home # (( allowed on DPR )

## 2024-07-11 NOTE — Assessment & Plan Note (Signed)
 Blood pressures have been well-controlled but in fact are a little bit on the lower end is going to have her decrease the chlorthalidone  to once daily instead of twice.

## 2024-07-11 NOTE — Progress Notes (Signed)
 Established Patient Office Visit  Subjective  Patient ID: Alexis Mcneil, female    DOB: Apr 05, 1956  Age: 68 y.o. MRN: [id number]  Chief Complaint  Patient presents with   Diabetes    HPI  Diabetes - no hypoglycemic events. No wounds or sores that are not healing well. No increased thirst or urination. Checking glucose at home. Taking medications as prescribed without any side effects.  Hypertension- Pt denies chest pain, SOB, dizziness, or heart palpitations.  Taking meds as directed w/o problems.  Denies medication side effects.    Discussed the use of AI scribe software for clinical note transcription with the patient, who gave verbal consent to proceed.  History of Present Illness Alexis Mcneil is a 68 year old female who presents with severe knee pain and swelling.  Left knee pain and swelling - Severe pain and swelling in the left knee, progressively worsening - Pain is incapacitating and significantly limits ambulation - Requires use of a walker at home - Pain described as 'bone rubbing bone' when standing - Pain disrupts sleep - Previous aspiration of knee fluid identified a pseudogout - Ganglion cyst present on the side of the knee - Persistent pain despite starting colchicine  therapy - Meloxicam  used for pain management, but advised against concurrent use with colchicine  - 800 mg ibuprofen  used for severe pain  Muscle pain and cramping - Muscle pain and cramping ('Charlie horses') occur when walking  Radicular leg pain - History of sciatica with occasional shooting pain down the left leg  Glycemic control - A1c is 5.7 - On semaglutide  injections once a week. Tolerating well   Ophthalmologic status - History of cataract surgery with improved vision  Laboratory monitoring - No current thyroid  concerns - Due for routine blood work to check kidney and liver function, as well as blood salts       ROS    Objective:     BP 113/63   Pulse 89   Ht 5'  5.5 (1.664 m)   Wt 195 lb 1.9 oz (88.5 kg)   SpO2 95%   BMI 31.98 kg/m    Physical Exam Vitals and nursing note reviewed.  Constitutional:      Appearance: Normal appearance.  HENT:     Head: Normocephalic and atraumatic.  Eyes:     Conjunctiva/sclera: Conjunctivae normal.  Cardiovascular:     Rate and Rhythm: Normal rate and regular rhythm.  Pulmonary:     Effort: Pulmonary effort is normal.     Breath sounds: Normal breath sounds.  Skin:    General: Skin is warm and dry.  Neurological:     Mental Status: She is alert.  Psychiatric:        Mood and Affect: Mood normal.      Results for orders placed or performed in visit on 07/11/24  POCT HgB A1C  Result Value Ref Range   Hemoglobin A1C 5.7 (A) 4.0 - 5.6 %   HbA1c POC (<> result, manual entry)     HbA1c, POC (prediabetic range)     HbA1c, POC (controlled diabetic range)        The 10-year ASCVD risk score (Arnett DK, et al., 2019) is: 13%    Assessment & Plan:   Problem List Items Addressed This Visit       Cardiovascular and Mediastinum   HYPERTENSION, BENIGN ESSENTIAL - Primary   Blood pressures have been well-controlled but in fact are a little bit on the lower end is going  to have her decrease the chlorthalidone  to once daily instead of twice.      Relevant Medications   chlorthalidone  (HYGROTON ) 50 MG tablet   Other Relevant Orders   CMP14+EGFR   Lipid panel   Urine Microalbumin w/creat. ratio     Endocrine   Diabetes mellitus type 2 in obese   Relevant Orders   CMP14+EGFR   Lipid panel   Urine Microalbumin w/creat. ratio   POCT HgB A1C (Completed)   Other Visit Diagnoses       Encounter for immunization       Relevant Orders   Flu vaccine HIGH DOSE PF(Fluzone Trivalent) (Completed)     Acute pain of left knee       Relevant Medications   traMADol  (ULTRAM ) 50 MG tablet     Left sided sciatica          Assessment and Plan Assessment & Plan Right knee pain with ganglion cyst and  possible pseudogout Significant right knee pain with ganglion cyst, severe enough to require a walker. Colchicine  ineffective. Discussed potential knee replacement, considering typical lifespan of replacements. Muscle pain and cramping possibly related. - Prescribe tramadol  for pain management. - Advise against regular use of meloxicam  to avoid kidney issues. - Order blood work to assess kidney, liver function, and electrolytes. - Consider gabapentin  for long-term nerve pain management if needed. - Follow up with orthopedic specialist for right knee evaluation and potential knee replacement.  Sciatica, left  Intermittent sciatica with leg pain, possibly worsened by knee issues. - Consider gabapentin  for nerve pain if tramadol  is insufficient.  Type 2 diabetes mellitus Diabetes well-controlled with A1c of 5.7%. Semaglutide  2 mg weekly well-tolerated. - Continue semaglutide  2 mg weekly. - BMI down to 32.    General Health Maintenance Due for routine blood work and urine microalbumin test. Medicare wellness check scheduled. - Order routine blood work including kidney and liver function tests. - Order urine microalbumin test. - Schedule Medicare wellness check for September 30.   Return in about 4 months (around 11/10/2024) for Diabetes follow-up.    Dorothyann Byars, MD

## 2024-07-12 ENCOUNTER — Ambulatory Visit

## 2024-07-12 ENCOUNTER — Telehealth: Payer: Self-pay

## 2024-07-12 ENCOUNTER — Ambulatory Visit: Payer: Self-pay | Admitting: Family Medicine

## 2024-07-12 ENCOUNTER — Other Ambulatory Visit: Payer: Self-pay

## 2024-07-12 VITALS — BP 126/74 | Ht 65.5 in | Wt 195.0 lb

## 2024-07-12 DIAGNOSIS — M1120 Other chondrocalcinosis, unspecified site: Secondary | ICD-10-CM | POA: Diagnosis not present

## 2024-07-12 DIAGNOSIS — M25562 Pain in left knee: Secondary | ICD-10-CM

## 2024-07-12 DIAGNOSIS — M25561 Pain in right knee: Secondary | ICD-10-CM

## 2024-07-12 LAB — CMP14+EGFR
ALT: 14 IU/L (ref 0–32)
AST: 17 IU/L (ref 0–40)
Albumin: 4.3 g/dL (ref 3.9–4.9)
Alkaline Phosphatase: 114 IU/L (ref 44–121)
BUN/Creatinine Ratio: 20 (ref 12–28)
BUN: 21 mg/dL (ref 8–27)
Bilirubin Total: 0.6 mg/dL (ref 0.0–1.2)
CO2: 23 mmol/L (ref 20–29)
Calcium: 10.2 mg/dL (ref 8.7–10.3)
Chloride: 96 mmol/L (ref 96–106)
Creatinine, Ser: 1.06 mg/dL — ABNORMAL HIGH (ref 0.57–1.00)
Globulin, Total: 2.5 g/dL (ref 1.5–4.5)
Glucose: 111 mg/dL — ABNORMAL HIGH (ref 70–99)
Potassium: 3.9 mmol/L (ref 3.5–5.2)
Sodium: 136 mmol/L (ref 134–144)
Total Protein: 6.8 g/dL (ref 6.0–8.5)
eGFR: 58 mL/min/1.73 — ABNORMAL LOW (ref >59)

## 2024-07-12 LAB — MICROALBUMIN / CREATININE URINE RATIO
Creatinine, Urine: 104 mg/dL
Microalb/Creat Ratio: 8 mg/g{creat} (ref 0–29)
Microalbumin, Urine: 8.3 ug/mL

## 2024-07-12 LAB — LIPID PANEL
Chol/HDL Ratio: 3.8 ratio (ref 0.0–4.4)
Cholesterol, Total: 164 mg/dL (ref 100–199)
HDL: 43 mg/dL (ref >39)
LDL Chol Calc (NIH): 95 mg/dL (ref 0–99)
Triglycerides: 148 mg/dL (ref 0–149)
VLDL Cholesterol Cal: 26 mg/dL (ref 5–40)

## 2024-07-12 MED ORDER — CHLORTHALIDONE 25 MG PO TABS
25.0000 mg | ORAL_TABLET | Freq: Every day | ORAL | Status: AC
Start: 2024-07-12 — End: ?

## 2024-07-12 MED ORDER — METHYLPREDNISOLONE ACETATE 40 MG/ML IJ SUSP
40.0000 mg | Freq: Once | INTRAMUSCULAR | Status: AC
Start: 2024-07-12 — End: 2024-07-12
  Administered 2024-07-12: 40 mg via INTRA_ARTICULAR

## 2024-07-12 MED ORDER — METHYLPREDNISOLONE ACETATE 40 MG/ML IJ SUSP
40.0000 mg | Freq: Once | INTRAMUSCULAR | Status: AC
  Administered 2024-07-12: 40 mg via INTRA_ARTICULAR

## 2024-07-12 NOTE — Progress Notes (Signed)
   Subjective:    Patient ID: Alexis Mcneil, female    DOB: 68 y.o., 1956-05-24   MRN: [id number]  HPI  Chief Complaint: CPPD follow-up  Alexis Mcneil is a very pleasant 68 year old female presenting for follow-up on a CPPD flare she had recently Prescribed colchicine  0.6 mg and instructed to start this twice daily Little discernible effect with this Also given prescription for naproxen which she switched to Several doses did not seem to make significant change in her pain     Objective:   Physical Exam Vitals:   07/12/24 0754  BP: 126/74   Right knee: Previously visualized area of firm fluctuant swelling present just inferior lateral to the lateral joint line.  Effusion.  Medial and lateral joint line tenderness  Left knee: Effusion.  Medial lateral joint line tenderness  Intra-articular Knee Injection with Ultrasound Guidance Procedure Note Alexis Mcneil 1956-10-04 Indications: Pain Procedure Details Following the description of risks including infection, bleeding, damage to surrounding structures, patient provided written consent for left knee joint injection procedure. US  was used to identify the suprapatellar pouch. Patient was sterilely prepped in the usual fashion with alcohol.  Following topical anesthetization with ethyl chloride, 40 cc of serous appearing aspirate was removed from the knee, then they were injected with a solution of 40mg  Depo-medrol  and 3cc Mepivacaine 2% Lidocaine  via the superolateral approach into the suprapatellar pouch. This was well visualized under ultrasound, please see associated photographic documentation. Patient tolerated well without complication.  Precautions provided. Cleaned and dressing applied.  Intra-articular Knee Injection with Ultrasound Guidance Procedure Note Alexis Mcneil 05-22-1956 Indications: Pain Procedure Details Following the description of risks including infection, bleeding, damage to surrounding structures, patient provided  written consent for right knee joint injection procedure. US  was used to identify the suprapatellar pouch. Patient was sterilely prepped in the usual fashion with alcohol.  Following topical anesthetization with ethyl chloride, 15 cc of serous appearing aspirate was removed from the knee, then they were injected with a solution of 40mg  Depo-medrol  and 3cc Mepivacaine 2% Lidocaine  via the superolateral approach into the suprapatellar pouch. This was well visualized under ultrasound, please see associated photographic documentation. Patient tolerated well without complication.  Precautions provided. Cleaned and dressing applied.     Assessment & Plan:   Alexis Mcneil presents for follow-up on CPPD.  She is currently experiencing a flare of both knees which she has not been able to get into a colchicine  or naproxen.  Her last A1c showed excellent control of her blood sugar.  Lab Results  Component Value Date   HGBA1C 5.7 (A) 07/11/2024   Therefore today I recommended proceeding with an intra-articular injection of a steroid into both knees and can follow this with some naproxen.  Offered to schedule follow-up but patient prefers to follow-up as needed.

## 2024-07-12 NOTE — Progress Notes (Signed)
 Hi Illyana,  Urine is ok, no excess protein.

## 2024-07-12 NOTE — Progress Notes (Signed)
 Hi Alexis Mcneil, kidney function is stable at 1.0 liver enzymes look good.  Cholesterol looks better this time.  Still awaiting urine sample to check for protein.

## 2024-07-12 NOTE — Telephone Encounter (Signed)
 We can cut the losartan  in half and keep the 35m of chlorthalidone .  I worry if we stop the fluid pill too abruptly she will start retaining fluid

## 2024-07-12 NOTE — Telephone Encounter (Signed)
 Patient was only taking 1/2 pill daily currently - as this has been being reduced without changing in our system - should she stop this altogether?

## 2024-07-12 NOTE — Telephone Encounter (Signed)
 Patient requesting a call from cheryl gentry having difficulty with Ozempic  and novo-nordisc

## 2024-07-13 ENCOUNTER — Ambulatory Visit

## 2024-07-13 ENCOUNTER — Telehealth: Payer: Self-pay

## 2024-07-13 NOTE — Progress Notes (Signed)
   07/13/2024  Patient ID: Alexis Mcneil, female   DOB: 1956-03-02, 68 y.o.   MRN: [id number]  Clinic routed request stating patient requesting call back in regard to their Ozempic  received through [facility].  I attempted to contact the patient but was not able to reach her.  I did leave a HIPAA compliant voicemail with my direct phone number.  It appears 4 boxes of Ozempic  2mg  were delivered to [facility] on 6/10, and a reorder form for the medication was completed by Dr. Alvan on 8/29.  Contacted [facility] to check on the processing status of Ozempic  2mg  refill, and they state medication should arrive at [facility] no later than 9/24.  Sending patient a MyChart message to make her aware.  Alexis Mcneil, PharmD, DPLA

## 2024-07-13 NOTE — Telephone Encounter (Signed)
 This task has been completed as requested. Patient notified of the update. No other inquiries during the call. No further action needed.

## 2024-07-31 ENCOUNTER — Ambulatory Visit (INDEPENDENT_AMBULATORY_CARE_PROVIDER_SITE_OTHER): Admitting: Sports Medicine

## 2024-07-31 ENCOUNTER — Ambulatory Visit (HOSPITAL_BASED_OUTPATIENT_CLINIC_OR_DEPARTMENT_OTHER)
Admission: RE | Admit: 2024-07-31 | Discharge: 2024-07-31 | Disposition: A | Discharge status: Home / Self Care | Source: Ambulatory Visit | Attending: Sports Medicine | Admitting: Sports Medicine

## 2024-07-31 VITALS — BP 118/70 | Ht 65.0 in | Wt 192.0 lb

## 2024-07-31 DIAGNOSIS — G8929 Other chronic pain: Secondary | ICD-10-CM

## 2024-07-31 DIAGNOSIS — M25562 Pain in left knee: Secondary | ICD-10-CM | POA: Diagnosis not present

## 2024-07-31 DIAGNOSIS — M1712 Unilateral primary osteoarthritis, left knee: Secondary | ICD-10-CM | POA: Diagnosis not present

## 2024-07-31 DIAGNOSIS — M25561 Pain in right knee: Secondary | ICD-10-CM | POA: Diagnosis not present

## 2024-07-31 DIAGNOSIS — M1711 Unilateral primary osteoarthritis, right knee: Secondary | ICD-10-CM | POA: Diagnosis not present

## 2024-07-31 DIAGNOSIS — M7989 Other specified soft tissue disorders: Secondary | ICD-10-CM | POA: Diagnosis not present

## 2024-07-31 NOTE — Progress Notes (Signed)
   Subjective:    Patient ID: Alexis Mcneil, female    DOB: [birth year], 68 y.o.   MRN: [id number]  HPI chief: Right knee pain  Patient is a very pleasant 68 year old female who presents today with right knee pain and swelling.  She was recently seen in the office and diagnosed with pseudogout.  Aspiration and injection of both knees were done earlier this month.  Last week, she began to experience some pain and swelling of the knee and lower leg that began shortly after taking some Motrin  and Tylenol .  She thought the swelling may be secondary to medication so she stopped it.  Her swelling in her leg has improved but she still has some swelling in the knee.  She has been told in the past that she has knee arthritis.  No recent x-rays.    Review of Systems As above    Objective:   Physical Exam  Right knee: Range of motion is 0 to 90 degrees.  Trace effusion.  No erythema.  Joint is not warm to touch.  She is tender to palpation in the posterior knee.  Neurovascular intact distally.  Limited MSK ultrasound of the posterior knee shows no evidence of current Baker's cyst     Assessment & Plan:   Right knee pain and swelling likely secondary to DJD  Her history makes me question whether or not she had a ruptured Baker's cyst last week.  Although there is no evidence of Baker's cyst on today's ultrasound, the swelling in her lower leg would suggest that.  We will get x-rays to determine the amount of osteoarthritis present.  I will follow-up with her via telephone with those results when available.  Since her pain is slowly improving, we will hold on repeat cortisone injection for now.  This note was dictated using Dragon naturally speaking software and may contain errors in syntax, spelling, or content which have not been identified prior to signing this note.   Addendum: X-rays reviewed.  Right knee is bone-on-bone medial compartmental DJD.  She is already an established patient with Dr.  Cristy for her shoulders so I recommended that she speak with him about possibly seeing his partner, Dr. Edna.

## 2024-08-05 ENCOUNTER — Other Ambulatory Visit: Payer: Self-pay | Admitting: Family Medicine

## 2024-08-07 ENCOUNTER — Encounter: Payer: Self-pay | Admitting: Family Medicine

## 2024-08-07 ENCOUNTER — Telehealth: Payer: Self-pay | Admitting: *Deleted

## 2024-08-07 ENCOUNTER — Ambulatory Visit: Payer: Self-pay

## 2024-08-07 ENCOUNTER — Ambulatory Visit: Admitting: Family Medicine

## 2024-08-07 VITALS — BP 111/60 | HR 84 | Temp 97.7°F | Ht 65.0 in

## 2024-08-07 DIAGNOSIS — G609 Hereditary and idiopathic neuropathy, unspecified: Secondary | ICD-10-CM

## 2024-08-07 DIAGNOSIS — G8929 Other chronic pain: Secondary | ICD-10-CM

## 2024-08-07 DIAGNOSIS — N1831 Chronic kidney disease, stage 3a: Secondary | ICD-10-CM

## 2024-08-07 DIAGNOSIS — M25561 Pain in right knee: Secondary | ICD-10-CM | POA: Diagnosis not present

## 2024-08-07 DIAGNOSIS — R630 Anorexia: Secondary | ICD-10-CM | POA: Diagnosis not present

## 2024-08-07 DIAGNOSIS — R509 Fever, unspecified: Secondary | ICD-10-CM

## 2024-08-07 DIAGNOSIS — E669 Obesity, unspecified: Secondary | ICD-10-CM | POA: Diagnosis not present

## 2024-08-07 DIAGNOSIS — E1169 Type 2 diabetes mellitus with other specified complication: Secondary | ICD-10-CM

## 2024-08-07 DIAGNOSIS — M2550 Pain in unspecified joint: Secondary | ICD-10-CM | POA: Diagnosis not present

## 2024-08-07 DIAGNOSIS — E038 Other specified hypothyroidism: Secondary | ICD-10-CM

## 2024-08-07 MED ORDER — GABAPENTIN 100 MG PO CAPS: 100.0000 mg | ORAL_CAPSULE | Freq: Three times a day (TID) | ORAL | 1 refills | Status: DC

## 2024-08-07 NOTE — Progress Notes (Signed)
 Acute Office Visit  Subjective:     Patient ID: Alexis Mcneil, female    DOB: 17-Feb-1956, 68 y.o.   MRN: [id number]  Chief Complaint  Patient presents with   Edema    Feet and ankles   Knee Pain    HPI Patient is in today for swelling in her feet and ankles.  Shooting pain in her feet. Hx fo neuropathy.   Discussed the use of AI scribe software for clinical note transcription with the patient, who gave verbal consent to proceed.  History of Present Illness Alexis Mcneil is a 68 year old female with gout and pseudo gout who presents with worsening joint pain and swelling. She is accompanied by her roommate, who provides additional information about her symptoms.  Polyarticular joint pain and swelling - Worsening joint pain and swelling following previous episode of  pseudogout - Initial improvement in knees, followed by recurrence of pain and swelling - Significant pain and crepitus in knees with ambulation - Fluid accumulation in knees - Generalized joint pain involving shoulder and wrist - Uses wristband and sling for wrist support - colchicine  not helping.   Lower extremity symptoms - Swelling and pain in right foot, worsened by ambulation without shoes - Pain radiates up the leg - Neuropathic pain in lower extremities - Feet feel swollen upon waking - Balance difficulties and impaired ambulation - Requires walker for mobility within home  Constitutional symptoms - Excessive daytime sleepiness despite regular CPAP use - Persistent fatigue throughout the day - Low-grade fever of 64F nightly for the past week - Unintentional weight loss of 5 pounds, attributed to decreased food intake due to difficulty accessing kitchen  Medication use and efficacy - Current medications: meloxicam  (ineffective for pain), duloxetine  (for neuropathy) - Previous use of gabapentin  discontinued due to double vision, years ago - No recent changes in medication regimen  Recent  interventions and exposures - Received influenza vaccination approximately four weeks ago - No recent travel or tick bites   ROS      Objective:    BP 111/60 (BP Location: Right Arm, Patient Position: Sitting, Cuff Size: Normal)   Pulse 84   Temp 97.7 F (36.5 C) (Oral)   Ht 5' 5 (1.651 m)   SpO2 100%   BMI 31.95 kg/m  {Vitals History (Optional):23777}  Physical Exam Vitals reviewed.  Constitutional:      Appearance: Normal appearance.     Comments: Sitting in wheelchair.    HENT:     Head: Normocephalic.  Pulmonary:     Effort: Pulmonary effort is normal.  Neurological:     Mental Status: She is alert and oriented to person, place, and time. Mental status is at baseline.  Psychiatric:        Mood and Affect: Mood normal.        Behavior: Behavior normal.     No results found for any visits on 08/07/24.      Assessment & Plan:   Problem List Items Addressed This Visit       Endocrine   Hypothyroidism   Relevant Orders   CMP14+EGFR   CBC with Differential/Platelet   Sedimentation rate   C-reactive protein   CYCLIC CITRUL PEPTIDE ANTIBODY, IGG/IGA   ANA   Magnesium    VITAMIN D 25 Hydroxy (Vit-D Deficiency, Fractures)   B12   Vitamin B6   Vitamin B1   Uric acid   TSH   Urine Microalbumin w/creat. ratio   Diabetes mellitus type  2 in obese   Relevant Orders   CMP14+EGFR   CBC with Differential/Platelet   Sedimentation rate   C-reactive protein   CYCLIC CITRUL PEPTIDE ANTIBODY, IGG/IGA   ANA   Magnesium    VITAMIN D 25 Hydroxy (Vit-D Deficiency, Fractures)   B12   Vitamin B6   Vitamin B1   Uric acid   TSH   Urine Microalbumin w/creat. ratio     Genitourinary   CKD (chronic kidney disease) stage 3, GFR 30-59 ml/min (HCC)   Relevant Orders   CMP14+EGFR   CBC with Differential/Platelet   Sedimentation rate   C-reactive protein   CYCLIC CITRUL PEPTIDE ANTIBODY, IGG/IGA   ANA   Magnesium    VITAMIN D 25 Hydroxy (Vit-D Deficiency,  Fractures)   B12   Vitamin B6   Vitamin B1   Uric acid   TSH   Urine Microalbumin w/creat. ratio   Other Visit Diagnoses       Polyarthralgia    -  Primary   Relevant Orders   CMP14+EGFR   CBC with Differential/Platelet   Sedimentation rate   C-reactive protein   CYCLIC CITRUL PEPTIDE ANTIBODY, IGG/IGA   ANA   Magnesium    VITAMIN D 25 Hydroxy (Vit-D Deficiency, Fractures)   B12   Vitamin B6   Vitamin B1   Uric acid   TSH   Urine Microalbumin w/creat. ratio       Assessment and Plan Assessment & Plan Polyarticular joint pain and swelling with possible inflammatory arthritis Polyarticular joint pain and swelling with differential diagnosis including rheumatoid arthritis and lupus. - Order blood tests for sed rate and CRP.  Evaluate for autoimmune disorder.  Sosan some like she has had any recent travel that would put her at risk for certain mosquito borne illnesses or tick bites.  Right knee swelling and pain with pseudogout Right knee swelling and pain with fluid buildup. Colchicine  was ineffective. - Consider alternative treatments if symptoms persist.  Low-grade fever Low-grade fever possibly related to inflammatory or autoimmune process. - Report if fever exceeds 100.3F or if new symptoms develop.  Peripheral neuropathy Peripheral neuropathy with increased severity in right foot, affecting mobility. Gabapentin  considered for pain management. - Prescribe gabapentin  100 mg once daily, titrate to three times daily as tolerated. - Continue duloxetine .  Type 2 diabetes mellitus without complications Type 2 diabetes well-controlled with A1C of 5.7. Neuropathy not due to diabetes. - Monitor blood glucose levels. - Check electrolytes and B vitamins.  Unintentional weight loss Unintentional weight loss with decreased appetite and mobility issues affecting food access. - Monitor weight and nutritional intake. - Encourage adequate nutrition and  hydration.  Obstructive sleep apnea, on CPAP Obstructive sleep apnea with persistent fatigue and daytime sleepiness, indicating possible CPAP ineffectiveness. - Evaluate CPAP settings and effectiveness.  Fatigue and excessive daytime sleepiness Fatigue and excessive daytime sleepiness possibly related to ineffective CPAP or other conditions. - Evaluate CPAP therapy effectiveness. - Consider further evaluation if symptoms persist.   Meds ordered this encounter  Medications   gabapentin  (NEURONTIN ) 100 MG capsule    Sig: Take 1 capsule (100 mg total) by mouth 3 (three) times daily.    Dispense:  90 capsule    Refill:  1    No follow-ups on file.  Dorothyann Byars, MD

## 2024-08-07 NOTE — Telephone Encounter (Signed)
 FYI Only or Action Required?: FYI only for provider.  Patient was last seen in primary care on 07/11/2024 by Alexis Dorothyann BIRCH, MD.  Called Nurse Triage reporting Foot Swelling and Knee Pain.  Symptoms began a week ago.  Interventions attempted: OTC medications: Meloxicam , tylenol  and Other: elevated her legs.  Symptoms are: bilateral knee pain, bilateral feet and ankle swelling/pain, fatigue unchanged.  Triage Disposition: See PCP When Office is Open (Within 3 Days)  Patient/caregiver understands and will follow disposition?: Yes            Copied from CRM #[id number]. Topic: Clinical - Red Word Triage >> Aug 07, 2024  7:47 AM Alexis Mcneil wrote: Red Word that prompted transfer to Nurse Triage: patient is diabetic and has swelling in feet been on for a week, getting worse, swelling making both knees hurt Reason for Disposition  [1] MILD swelling of both ankles (i.e., pedal edema) AND [2] new-onset or getting worse  Answer Assessment - Initial Assessment Questions 1. ONSET: When did the swelling start? (e.g., minutes, hours, days)     X 1 week.  2. LOCATION: What part of the leg is swollen?  Are both legs swollen or just one leg?     Bilateral feet and ankles.  3. SEVERITY: How bad is the swelling? (e.g., localized; mild, moderate, severe)     Slight/mild.  4. REDNESS: Is there redness or signs of infection?     Denies redness, red streaks, warmth.  5. PAIN: Is the swelling painful to touch? If Yes, ask: How painful is it?   (Scale 1-10; mild, moderate or severe)     Yes, moderate. She states they hurt when she stands up.  6. FEVER: Do you have a fever? If Yes, ask: What is it, how was it measured, and when did it start?      No fever but states every night her temperature raises slightly to 99 degrees.  7. CAUSE: What do you think is causing the leg swelling?     She states 2 weeks ago she went to her knee doctor and has pseudo gout and had fluid  drawn off. She states since then she has been experiencing her symptoms. She wants to also rule out any diabetes as the cause of her issues.  8. MEDICAL HISTORY: Do you have a history of blood clots (e.g., DVT), cancer, heart failure, kidney disease, or liver failure?     No blood clots, cancer, heart failure or liver failure. She states he was told she has kidney disease but it is under control.  9. RECURRENT SYMPTOM: Have you had leg swelling before? If Yes, ask: When was the last time? What happened that time?     No.  10. OTHER SYMPTOMS: Do you have any other symptoms? (e.g., chest pain, difficulty breathing)       Bilateral knee pain (8/10); fatigue. Denies chest pain, SOB.  11. PREGNANCY: Is there any chance you are pregnant? When was your last menstrual period?       N/A.  Protocols used: Leg Swelling and Edema-A-AH

## 2024-08-07 NOTE — Patient Instructions (Addendum)
 Ok to start gabapentin  with 1 tab daily see how that does for couple days then increase to twice a day and gradually up to 3 times a day as tolerated.  If we need to go up on your dose please let me know.  If you get double vision again then please stop it.

## 2024-08-07 NOTE — Telephone Encounter (Signed)
Referral sent to [facility]. 

## 2024-08-07 NOTE — Telephone Encounter (Signed)
-----   Message from Evalene Bernardino Chang sent at 08/03/2024 11:48 AM EDT ----- Regarding: FW: Pt wants to proceed w/ Ortho Spec referral Please refer to Dr Melodi at [facility] for TKA ----- Message ----- From: Kayla Alisa CROME Sent: 08/03/2024  11:47 AM EDT To: Evalene JONELLE Chang, DO Subject: Pt wants to proceed w/ Ortho Spec referral     Pt says would like to see Dr. Theotis for her chronic B/L knee pain.  Pls & Thx

## 2024-08-07 NOTE — Telephone Encounter (Signed)
 Pateint seen in office today with DR. Metheney

## 2024-08-08 ENCOUNTER — Encounter: Payer: Self-pay | Admitting: Family Medicine

## 2024-08-08 ENCOUNTER — Other Ambulatory Visit (HOSPITAL_COMMUNITY): Payer: Self-pay

## 2024-08-08 ENCOUNTER — Ambulatory Visit: Payer: Self-pay | Admitting: Family Medicine

## 2024-08-08 DIAGNOSIS — M25511 Pain in right shoulder: Secondary | ICD-10-CM | POA: Diagnosis not present

## 2024-08-08 DIAGNOSIS — R7681 Abnormal rheumatoid factor and anti-citrullinated protein antibody without rheumatoid arthritis: Secondary | ICD-10-CM

## 2024-08-08 MED ORDER — METHYLPREDNISOLONE 4 MG PO TBPK
ORAL_TABLET | ORAL | 0 refills | Status: DC
  Filled 2024-08-08: qty 21, 6d supply, fill #0

## 2024-08-08 NOTE — Assessment & Plan Note (Signed)
 Type 2 diabetes mellitus without complications Type 2 diabetes well-controlled with A1C of 5.7. Neuropathy not due to diabetes. - Monitor blood glucose levels. - Check electrolytes and B vitamins.

## 2024-08-08 NOTE — Assessment & Plan Note (Signed)
 Peripheral neuropathy Peripheral neuropathy with increased severity in right foot, affecting mobility. Gabapentin  considered for pain management. - Prescribe gabapentin  100 mg once daily, titrate to three times daily as tolerated. - Continue duloxetine .

## 2024-08-08 NOTE — Progress Notes (Signed)
 HI Alexis Mcneil, your kidney function is stable. Potassium was little low.  Similar to last year. Liver is good.  Magnesium  is low. Please start over the counter magnesium  oxide around 250-500 mg daily. Plan to recheck in 8 weeks.  Inflammatory markers are elevated.  No lupus. Rheumatoid test still pending  No anemia of systemic infection.  Vit D is OK. B12 is low.  Recommend start B12 shots in our office weekly x 1 month then every 2 weeks x 2 months an then monthly.   Thyroid  and uric acid is ok. No excess protein in the urine.  B1 and B6 pending.

## 2024-08-09 ENCOUNTER — Telehealth: Payer: Self-pay

## 2024-08-09 NOTE — Telephone Encounter (Signed)
 Spoke with [facility] and they will refax this form today  to Dr Alvan.

## 2024-08-09 NOTE — Telephone Encounter (Signed)
 The patient called in to make sure the pre surgical clearance form was received from Beverley Millman and signed and sent back. I called and spoke with Lorenza who checked with a nurse and they still have not received the form. Lorenza said she will call [facility] Mill[facility] in the morning herself and make sure they get the form. The patient states for whatever reason she had this problem previously when she had her shoulder surgery. She says yes please call first thing in the morning because she needs to get this need replacement surgery done.

## 2024-08-09 NOTE — Telephone Encounter (Signed)
 Patient called to see if the fax from [facility] was recvd.. Advised the practice has spoken to someone at her ortho office and was aware the form was coming by fax

## 2024-08-09 NOTE — Telephone Encounter (Signed)
 Copied from CRM #[id number]. Topic: General - Other >> Aug 08, 2024  3:28 PM Alexis Mcneil wrote: Reason for CRM: Patient states she is needing approval for surgery form returned to [employer]

## 2024-08-10 ENCOUNTER — Ambulatory Visit (INDEPENDENT_AMBULATORY_CARE_PROVIDER_SITE_OTHER)

## 2024-08-10 ENCOUNTER — Telehealth: Payer: Self-pay | Admitting: Family Medicine

## 2024-08-10 VITALS — BP 145/72 | HR 80 | Resp 16 | Ht 65.0 in

## 2024-08-10 DIAGNOSIS — E538 Deficiency of other specified B group vitamins: Secondary | ICD-10-CM | POA: Diagnosis not present

## 2024-08-10 MED ORDER — CYANOCOBALAMIN 1000 MCG/ML IJ SOLN
1000.0000 ug | INTRAMUSCULAR | Status: DC
  Administered 2024-08-10 – 2024-09-01 (×4): 1000 ug via INTRAMUSCULAR

## 2024-08-10 NOTE — Telephone Encounter (Signed)
 Pt needs [place] Placard completed. Forms placed in [employer].  Pt also would like New CPAP machine and supplies. She is aware that new sleep study may need to be completed and is okay with doing Home sleep study. Please advise.   [phone]

## 2024-08-10 NOTE — Telephone Encounter (Signed)
 Fax received from Alexis Mcneil, patient also came by office and bought form , thanks.

## 2024-08-10 NOTE — Progress Notes (Signed)
 Patient is coming in today for her initial B12 injection. She is starting her weekly series for 1 month, then transition to every 2 weeks for 2 months, and then monthly. Denies CP, SOB, medication changes, or palpitations. Injection given in LD. Tolerated well no redness or swelling noted at the site. Patient advised to RTC in 7 days around 08/17/24.

## 2024-08-11 ENCOUNTER — Telehealth: Payer: Self-pay

## 2024-08-11 LAB — CBC WITH DIFFERENTIAL/PLATELET
Basophils Absolute: 0 x10E3/uL (ref 0.0–0.2)
Basos: 1 %
EOS (ABSOLUTE): 0.2 x10E3/uL (ref 0.0–0.4)
Eos: 3 %
Hematocrit: 36 % (ref 34.0–46.6)
Hemoglobin: 12.2 g/dL (ref 11.1–15.9)
Immature Grans (Abs): 0 x10E3/uL (ref 0.0–0.1)
Immature Granulocytes: 0 %
Lymphocytes Absolute: 1.2 x10E3/uL (ref 0.7–3.1)
Lymphs: 14 %
MCH: 29.5 pg (ref 26.6–33.0)
MCHC: 33.9 g/dL (ref 31.5–35.7)
MCV: 87 fL (ref 79–97)
Monocytes Absolute: 0.7 x10E3/uL (ref 0.1–0.9)
Monocytes: 7 %
Neutrophils Absolute: 6.6 x10E3/uL (ref 1.4–7.0)
Neutrophils: 75 %
Platelets: 365 x10E3/uL (ref 150–450)
RBC: 4.13 x10E6/uL (ref 3.77–5.28)
RDW: 14.2 % (ref 11.7–15.4)
WBC: 8.8 x10E3/uL (ref 3.4–10.8)

## 2024-08-11 LAB — VITAMIN B6: Vitamin B6: 4 ug/L (ref 3.4–65.2)

## 2024-08-11 LAB — CMP14+EGFR
ALT: 10 IU/L (ref 0–32)
AST: 14 IU/L (ref 0–40)
Albumin: 4.2 g/dL (ref 3.9–4.9)
Alkaline Phosphatase: 101 IU/L (ref 49–135)
BUN/Creatinine Ratio: 20 (ref 12–28)
BUN: 22 mg/dL (ref 8–27)
Bilirubin Total: 0.7 mg/dL (ref 0.0–1.2)
CO2: 24 mmol/L (ref 20–29)
Calcium: 10 mg/dL (ref 8.7–10.3)
Chloride: 93 mmol/L — ABNORMAL LOW (ref 96–106)
Creatinine, Ser: 1.09 mg/dL — ABNORMAL HIGH (ref 0.57–1.00)
Globulin, Total: 2.5 g/dL (ref 1.5–4.5)
Glucose: 99 mg/dL (ref 70–99)
Potassium: 3.3 mmol/L — ABNORMAL LOW (ref 3.5–5.2)
Sodium: 135 mmol/L (ref 134–144)
Total Protein: 6.7 g/dL (ref 6.0–8.5)
eGFR: 55 mL/min/1.73 — ABNORMAL LOW (ref >59)

## 2024-08-11 LAB — URIC ACID: Uric Acid: 5.4 mg/dL (ref 3.0–7.2)

## 2024-08-11 LAB — MAGNESIUM: Magnesium: 1.2 mg/dL — ABNORMAL LOW (ref 1.6–2.3)

## 2024-08-11 LAB — CYCLIC CITRUL PEPTIDE ANTIBODY, IGG/IGA: Cyclic Citrullin Peptide Ab: 66 U — AB (ref 0–19)

## 2024-08-11 LAB — C-REACTIVE PROTEIN: CRP: 133 mg/L — ABNORMAL HIGH (ref 0–10)

## 2024-08-11 LAB — MICROALBUMIN / CREATININE URINE RATIO
Creatinine, Urine: 191.7 mg/dL
Microalb/Creat Ratio: 6 mg/g{creat} (ref 0–29)
Microalbumin, Urine: 11.5 ug/mL

## 2024-08-11 LAB — VITAMIN D 25 HYDROXY (VIT D DEFICIENCY, FRACTURES): Vit D, 25-Hydroxy: 42.7 ng/mL (ref 30.0–100.0)

## 2024-08-11 LAB — TSH: TSH: 2.67 u[IU]/mL (ref 0.450–4.500)

## 2024-08-11 LAB — VITAMIN B12: Vitamin B-12: 237 pg/mL (ref 232–1245)

## 2024-08-11 LAB — VITAMIN B1: Thiamine: 111.9 nmol/L (ref 66.5–200.0)

## 2024-08-11 LAB — SEDIMENTATION RATE: Sed Rate: 67 mm/h — ABNORMAL HIGH (ref 0–40)

## 2024-08-11 LAB — ANA: Anti Nuclear Antibody (ANA): NEGATIVE

## 2024-08-11 NOTE — Telephone Encounter (Signed)
 Called and informed pt about handicap placard. Placed up front for her to pick up  Will send to [facility] to inquire about CPAP machine and supplies and have her to contact pt about this.

## 2024-08-11 NOTE — Telephone Encounter (Signed)
 55-month DMV form completed.  I believe Alexis Mcneil was already working on trying to get new CPAP supplies but Alexis Mcneil can you check on this.

## 2024-08-11 NOTE — Telephone Encounter (Signed)
 Received Ozempic  8mg /76ml  3 boxes ( one pen per box) form patient assistance Patient informed that medication was received at office and placed in patient refrigerator . She does plan to pick this medication up on Monday 08/14/2024

## 2024-08-11 NOTE — Progress Notes (Signed)
 Hi Alexis Mcneil the rheumatoid test called an anti-CCP did come back just slightly elevated at 66.  Would like to refer you to a rheumatologist for further workup.  This does not 100% diagnose rheumatoid arthritis but it is considered one of the criteria.  Do you have a preference for provider or location? Your B6 is a little on the low end as well so would recommend starting an over-the-counter supplement for that.  Your B1 looks great.

## 2024-08-14 NOTE — Telephone Encounter (Signed)
 Completed medical clearance form received from Dr. Alvan and faxed to Beverley Millman at [phone].  Pt aware and appreciative.

## 2024-08-15 NOTE — Telephone Encounter (Signed)
 Pended referral for rheumatology

## 2024-08-15 NOTE — Addendum Note (Signed)
 Addended by: Bradley Bostelman P on: 08/15/2024 08:29 AM   Modules accepted: Orders

## 2024-08-16 NOTE — Telephone Encounter (Signed)
 No orders of the defined types were placed in this encounter.  Orders Placed This Encounter  Procedures   Ambulatory referral to Rheumatology    Referral Priority:   Routine    Referral Type:   Consultation    Referral Reason:   Specialty Services Required    Requested Specialty:   Rheumatology    Number of Visits Requested:   1

## 2024-08-16 NOTE — Addendum Note (Signed)
 Addended by: Jacari Kirsten D on: 08/16/2024 12:38 PM   Modules accepted: Orders

## 2024-08-16 NOTE — Progress Notes (Unsigned)
 Patient is coming in today for her B12 injection. She is starting her weekly series for 1 month, then transition to every 2 weeks for 2 months, and then monthly. Denies CP, SOB, medication changes, or palpitations. Injection given in RD. Tolerated well no redness or swelling noted at the site. Patient advised to RTC in 7 days around 08/23/24.

## 2024-08-17 ENCOUNTER — Ambulatory Visit

## 2024-08-17 VITALS — BP 106/59 | HR 86 | Ht 65.0 in | Wt 192.2 lb

## 2024-08-17 DIAGNOSIS — E538 Deficiency of other specified B group vitamins: Secondary | ICD-10-CM | POA: Diagnosis not present

## 2024-08-17 NOTE — Progress Notes (Signed)
   Established Patient Office Visit  Subjective   Patient ID: Alexis Mcneil, female    DOB: 1956/10/22  Age: 68 y.o. MRN: [id number]  Chief Complaint  Patient presents with   B12 deficiency    Vit B12 nurse visit    HPI  Vit B12 deficiency. Vit B12 injection. Patient denies weakness, GI problems or irregular heart rate. Last injection given on 08/10/2024 and patient did not have any complications with this injection.   ROS    Objective:     BP (!) 106/59   Pulse 86   Ht 5' 5 (1.651 m)   Wt 192 lb 3 oz (87.2 kg)   SpO2 100%   BMI 31.98 kg/m    Physical Exam   No results found for any visits on 08/17/24.    The 10-year ASCVD risk score (Arnett DK, et al., 2019) is: 13.2%    Assessment & Plan:  Admin Cyanocobalamin 1,000mcg IM right deltoid. Patient tolerated injection well without complications.  Patient already scheduled for next injection as nurse visit on 08/24/2024.  Problem List Items Addressed This Visit       Other   B12 deficiency - Primary    No follow-ups on file.    Suzen SHAUNNA Plenty, LPN

## 2024-08-17 NOTE — Patient Instructions (Signed)
 Patient already scheduled for next injection as nurse visit on 08/24/2024.

## 2024-08-20 ENCOUNTER — Other Ambulatory Visit: Payer: Self-pay | Admitting: Family Medicine

## 2024-08-23 ENCOUNTER — Other Ambulatory Visit: Payer: Self-pay | Admitting: *Deleted

## 2024-08-23 ENCOUNTER — Telehealth: Payer: Self-pay | Admitting: Family Medicine

## 2024-08-23 DIAGNOSIS — G4733 Obstructive sleep apnea (adult) (pediatric): Secondary | ICD-10-CM

## 2024-08-23 NOTE — Telephone Encounter (Signed)
Sleep study orders, clinical notes, demographics, and copies of insurance cards have been faxed to [facility] at [phone]. Office will contact patient to complete online registration in order to ship home sleep study equipment.

## 2024-08-24 ENCOUNTER — Ambulatory Visit (INDEPENDENT_AMBULATORY_CARE_PROVIDER_SITE_OTHER)

## 2024-08-24 DIAGNOSIS — E538 Deficiency of other specified B group vitamins: Secondary | ICD-10-CM | POA: Diagnosis not present

## 2024-08-24 NOTE — Progress Notes (Signed)
 Patient is coming in today for her 3rd B12 injection. Denies muscle cramps or weakness, medication changes, or palpitations.   Injection given in LD. Tolerated well no redness or swelling noted at the site. Patient advised to RTC in 7 days around 08/31/24.

## 2024-08-28 ENCOUNTER — Telehealth: Payer: Self-pay

## 2024-08-28 NOTE — Telephone Encounter (Signed)
 Forwarding message to referral team to see if referral location could be changed to patient preferred location as below  Patient states she was contacted by  [facility] [address] [place], [address] [place] [address] Referral and was told that the  earliest appt they had available was Dec 29th   She is requesting the location of the referral be changed to  [facility] [address] [phone]  She states she could see her mid November if referral received

## 2024-08-28 NOTE — Telephone Encounter (Signed)
 Copied from CRM #[id number]. Topic: Referral - Request for Referral >> Aug 28, 2024 11:32 AM Anairis L wrote: Did the patient discuss referral with their provider in the last year? Yes (If No - schedule appointment) (If Yes - send message)  Appointment offered? No  Type of order/referral and detailed reason for visit: Rheumatologist   Preference of office, provider, location: [facility] rheumatology [phone]  If referral order, have you been seen by this specialty before? No (If Yes, this issue or another issue? When? Where?  Can we respond through MyChart? Yes

## 2024-08-29 NOTE — Telephone Encounter (Signed)
 Home sleep study equipment has been shipped to patient on 08/28/24 per [facility].

## 2024-08-30 NOTE — Telephone Encounter (Signed)
 Last read by Karna FORBES Moles at 7:14PM on 08/28/2024.

## 2024-08-31 ENCOUNTER — Ambulatory Visit

## 2024-08-31 DIAGNOSIS — M25562 Pain in left knee: Secondary | ICD-10-CM | POA: Diagnosis not present

## 2024-08-31 DIAGNOSIS — M25561 Pain in right knee: Secondary | ICD-10-CM | POA: Diagnosis not present

## 2024-08-31 DIAGNOSIS — G8929 Other chronic pain: Secondary | ICD-10-CM | POA: Diagnosis not present

## 2024-08-31 NOTE — Progress Notes (Unsigned)
 Patient is coming in today for her 4th weekly B12 injection. Denies muscle cramps or weakness, medication changes, or palpitations. Patient is transitioning to every 14 days.   Injection given in RD. Tolerated well no redness or swelling noted at the site. Patient advised to RTC in 14 days around 09/15/24.

## 2024-09-01 ENCOUNTER — Ambulatory Visit

## 2024-09-01 DIAGNOSIS — E538 Deficiency of other specified B group vitamins: Secondary | ICD-10-CM

## 2024-09-01 MED ORDER — CYANOCOBALAMIN 1000 MCG/ML IJ SOLN
1000.0000 ug | INTRAMUSCULAR | Status: DC
  Administered 2024-09-11 – 2024-10-11 (×3): 1000 ug via INTRAMUSCULAR

## 2024-09-04 DIAGNOSIS — M5412 Radiculopathy, cervical region: Secondary | ICD-10-CM | POA: Diagnosis not present

## 2024-09-04 DIAGNOSIS — G4733 Obstructive sleep apnea (adult) (pediatric): Secondary | ICD-10-CM | POA: Diagnosis not present

## 2024-09-04 DIAGNOSIS — M19011 Primary osteoarthritis, right shoulder: Secondary | ICD-10-CM | POA: Diagnosis not present

## 2024-09-04 NOTE — Progress Notes (Signed)
 Surgery orders requested via Epic inbox.

## 2024-09-06 ENCOUNTER — Ambulatory Visit: Attending: Orthopedic Surgery

## 2024-09-06 DIAGNOSIS — M6281 Muscle weakness (generalized): Secondary | ICD-10-CM | POA: Diagnosis not present

## 2024-09-06 DIAGNOSIS — G8929 Other chronic pain: Secondary | ICD-10-CM | POA: Diagnosis not present

## 2024-09-06 DIAGNOSIS — R2689 Other abnormalities of gait and mobility: Secondary | ICD-10-CM | POA: Diagnosis not present

## 2024-09-06 DIAGNOSIS — M25561 Pain in right knee: Secondary | ICD-10-CM | POA: Insufficient documentation

## 2024-09-06 DIAGNOSIS — M25562 Pain in left knee: Secondary | ICD-10-CM | POA: Insufficient documentation

## 2024-09-06 NOTE — Therapy (Cosign Needed)
 OUTPATIENT PHYSICAL THERAPY LOWER EXTREMITY EVALUATION   Patient Name: Alexis Mcneil MRN: [id number] DOB:May 01, 1956, 68 y.o., female Today's Date: 09/07/2024  END OF SESSION:  PT End of Session - 09/06/24 1550     Visit Number 1    Number of Visits 5    Date for Recertification  09/19/24    Authorization Type Medicare Part A and B    PT Start Time 1450    PT Stop Time 1530    PT Time Calculation (min) 40 min    Equipment Utilized During Treatment Other (comment)    Activity Tolerance Patient tolerated treatment well    Behavior During Therapy [facility] for tasks assessed/performed          Past Medical History:  Diagnosis Date   Anemia    Arthritis    KNEE,SHOULDERS   Cataract January 2021   Eye appt visit   Diabetes mellitus without complication (HCC)    GERD (gastroesophageal reflux disease)    Heart murmur    Hyperlipidemia    Hypertension    IFG (impaired fasting glucose)    Neuromuscular disorder (HCC)    NEUROPATHY FEET   OSA (obstructive sleep apnea)    CPAP   Sleep apnea    CPAP   Thyroid  disease    Past Surgical History:  Procedure Laterality Date   COLONOSCOPY     endometrial polyp     January 2014   EYE SURGERY  1988   Cyst on eye removed   mouth palate surgery repair  1961   large tear   ROOT CANEL     2 WEEKS AGO   rt knee surgery arthroscopy  2009   torn meniscus/medial   torn ligament shoulder  1975   repaired   Patient Active Problem List   Diagnosis Date Noted   B12 deficiency 08/17/2024   Primary osteoarthritis of right knee 12/04/2021   CKD (chronic kidney disease) stage 3, GFR 30-59 ml/min (HCC) 11/12/2021   Pre-ulcerative corn or callous 05/20/2020   Excessive sweating 05/20/2020   Gout 03/07/2018   Hereditary and idiopathic peripheral neuropathy 09/24/2015   Trochanteric bursitis of both hips 10/08/2014   Lumbar spinal stenosis 09/06/2014   Right knee pain 09/06/2014   Obstructive sleep apnea 09/26/2010   Left rotator cuff  tear 03/18/2010   PARESTHESIA, HANDS 03/18/2010   Diabetes mellitus type 2 in obese 02/07/2008   Hypothyroidism 01/06/2007   Hyperlipidemia 01/06/2007   ANEMIA NOS 01/06/2007   HYPERTENSION, BENIGN ESSENTIAL 01/06/2007    PCP: Dorothyann Byars, MD  REFERRING PROVIDER: Toribio Higashi, MD  REFERRING DIAG: ICD-10M17.0 Bilateral primary OA of knee  THERAPY DIAG:  Muscle weakness (generalized)  Other abnormalities of gait and mobility  Chronic pain of right knee  Chronic pain of left knee  Rationale for Evaluation and Treatment: Rehabilitation  ONSET DATE: End of August   SUBJECTIVE:   SUBJECTIVE STATEMENT: Pt reports she has been able to move independently, completing all chores and housework without problems until this past August 2025. She reports she has had 2 falls since then, losing her balance and feeling extremely weak in both legs. She states having a history of injuries in the right knee, and has been scheduled to have Right  TKR on Sep 20, 2024. She is concerned her left knee is weak, but is hopeful it will get her through the recovery process. She wants to do PT in preparation for her surgery the next 2 weeks. Reports having pseudogout, every joint  hurts.  She currently ambulates with SPC.     PERTINENT HISTORY: Diabetes Neuropathy in feet  Paresthesia hands HTN  PAIN:  Are you having pain? Yes: NPRS scale: 4-5/10 when on medicine, 10/10 worse not on medicine  Pain location: anterior patella , lateral aspect  Pain description: ache, throbbing  Aggravating factors: sleeping, sitting, WB Relieving factors: LAQ, NSAID  PRECAUTIONS: Fall  RED FLAGS: None   WEIGHT BEARING RESTRICTIONS: No  FALLS:  Has patient fallen in last 6 months? No and Yes. 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  DIAGNOSTIC FINDINGS:   IMPRESSION: Right knee 1. Severe tricompartmental degenerative changes, as described above. 2. 2.7 cm loose body in the suprapatellar recess.  IMPRESSION: Left knee 1. Stable marked medial joint space degenerative changes and moderate tricompartmental degenerative changes. 2. Probable posterior ossified loose bodies.     PATIENT SURVEYS:  PSFS: THE PATIENT SPECIFIC FUNCTIONAL SCALE  Place score of 0-10 (0 = unable to perform activity and 10 = able to perform activity at the same level as before injury or problem)  Activity Date: 09/06/24    Sit to stand transfer without UE support  3    2. Tandem stance 30 sec  0    3.     4.      Total Score 1.5      Total Score = Sum of activity scores/number of activities  Minimally Detectable Change: 3 points (for single activity); 2 points (for average score)  [facility] (nd). The Patient Specific Functional Scale . Retrieved from [email or web address]   COGNITION: Overall cognitive status: Within functional limits for tasks assessed     SENSATION: Not tested    MUSCLE LENGTH: Bil hamstring tightness  POSTURE: rounded shoulders, forward head, and flexed trunk     LOWER EXTREMITY ROM:  Active ROM Right eval Left eval  Hip flexion    Hip extension    Hip abduction    Hip adduction    Hip internal rotation    Hip external rotation    Knee flexion 95 112 deg   Knee extension Lacking 11 deg  WFL  Ankle dorsiflexion    Ankle plantarflexion    Ankle inversion    Ankle  eversion     (Blank rows = not tested)  LOWER EXTREMITY MMT:  MMT Right eval Left eval  Hip flexion 3+ 3+  Hip extension 4- 4-  Hip abduction 3+ pain 4+  Hip adduction    Hip internal rotation 4 4  Hip external rotation 4 4  Knee flexion 3 3+  Knee extension 5 4+  Ankle dorsiflexion 5 4  Ankle plantarflexion 5 4  Ankle inversion 4- 3  Ankle eversion 4- 4-   (Blank rows = not tested)    FUNCTIONAL TESTS:  5 times sit to stand: 19.92 sec  Timed up and go (TUG): 21.04 sec  Tandem stance 30 sec both sides- right leg behind required UE support   GAIT:  Distance walked: 75 ft  Assistive device utilized: Single point cane Level of assistance: Modified independence Comments: Pt depends on her SPC to maintain balance while walking and tends to lean to left side during gait                                                                                                                                 TREATMENT DATE:    [facility] Adult PT Treatment:                                                DATE: 09/06/24 Therapeutic Exercise: See HEP Review HEP   PATIENT EDUCATION:  Education details: HEP and POC Person educated: Patient Education method: Explanation, Demonstration, Tactile cues, Verbal cues, and Handouts Education comprehension: verbalized understanding, returned demonstration, verbal cues required, tactile cues required, and needs further education  HOME EXERCISE PROGRAM: Access Code: [email or web address] URL: [email or web address] Date: 09/06/2024 Prepared by: Lavanda Cleverly  Exercises - Seated Long Arc Quad  - 1 x daily - 7 x weekly - 3 sets - 10 reps - Seated Ankle Pumps on Table  - 1 x daily - 7 x weekly - 3 sets - 10 reps - Seated Hip Abduction  - 1 x daily - 7 x weekly - 3 sets - 10 reps - Supine Heel Slides  - 1 x daily - 7 x weekly - 3 sets - 10 reps - Seated March  - 1 x daily - 7 x weekly - 3 sets - 10 reps  ASSESSMENT:  CLINICAL  IMPRESSION: Patient is a 68 y.o. female who was seen today for physical therapy evaluation and treatment for prehab of bilateral knee OA with planned Rt TKA on 09/20/24. Pt reports having an ongoing history of injuries in her right knee and is finally having surgery to help improve functional capacity. She reports having a downward trajectory of events this past August 2025, stating she had 2 falls from loss of balance. She is concerned her left knee is unstable and weak, thus would like to strengthen it before surgery with PT exercises. She states having neuropathy in both feet create challenges for completing everyday activities. Upon assessment pt presents with significant LE weakness RT>Lt and decreased right knee flexion and extension mobility. She will benefit from skilled PT to address the above stated deficits in order to better prepare for upcoming surgery and allow for optimal outcome.   OBJECTIVE IMPAIRMENTS: Abnormal gait, decreased activity tolerance, decreased balance, decreased coordination, decreased endurance, decreased mobility, difficulty walking, decreased ROM, decreased strength, increased fascial restrictions, impaired flexibility, improper body mechanics, postural dysfunction, and pain.   ACTIVITY LIMITATIONS: carrying, lifting, bending, sitting, standing, squatting, sleeping, stairs, and transfers  PARTICIPATION LIMITATIONS: meal prep, cleaning, laundry, medication management, driving, community activity, and occupation  PERSONAL FACTORS:  Age, Fitness, Past/current experiences, Profession, Time since onset of injury/illness/exacerbation, and 3+ comorbidities: Bil feet neuropathy  are also affecting patient's functional outcome.   REHAB POTENTIAL: Good  CLINICAL DECISION MAKING: Evolving/moderate complexity  EVALUATION COMPLEXITY: Moderate   GOALS: Goals reviewed with patient? Yes  SHORT /LONG TERM GOALS: Target date: 09/19/24 Pt will be efficient in pre surgery HEP so  she has optimizing post surgery recovery and decrease risk for injury Baseline: Goal status: INITIAL  2.  Pt will perform TUG in 15 sec or less to reduce fall risk.  Baseline: 19 sec  Goal status: INITIAL  3.  Pt will perform 5 sit to stands in 15 sec or less so she increases strength for walking Baseline: 21 sec  Goal status: INITIAL  4.  Pt will score 2 points higher on her PSPF score so she is a decreased fall risk  Baseline: see above Goal status: INITIAL  5.  Pt will lack 6 degrees or less in right knee extension so she ambulates with safe body mechanics Baseline: lacking 11 deg  Goal status: INITIAL       PLAN:  PT FREQUENCY: 1-2x/week  PT DURATION: 2 weeks  PLANNED INTERVENTIONS: 97750- Physical Performance Testing, 97110-Therapeutic exercises, 97530- Therapeutic activity, [id number]- Neuromuscular re-education, 97535- Self Care, 02859- Manual therapy, [id number]- Gait training, [phone]- Orthotic Initial, [id number]- Aquatic Therapy, [phone]- Electrical stimulation (manual), [phone] (1-2 muscles), 20561 (3+ muscles)- Dry Needling, Patient/Family education, Balance training, Stair training, Joint mobilization, Scar mobilization, DME instructions, Cryotherapy, and Moist heat  PLAN FOR NEXT SESSION: LAQ, hamstring stretch, quad set, ankle pumps; generalized LE strengthening and static balance activity to prepare for upcoming surgery. Stair negotiation for surgical prep.   Lavanda Cleverly, SPT  Samantha [place], PT, DPT, ATC 09/07/24 10:02 AM

## 2024-09-07 ENCOUNTER — Other Ambulatory Visit: Payer: Self-pay

## 2024-09-07 ENCOUNTER — Ambulatory Visit: Payer: Self-pay | Admitting: Emergency Medicine

## 2024-09-07 DIAGNOSIS — E669 Obesity, unspecified: Secondary | ICD-10-CM | POA: Diagnosis not present

## 2024-09-07 DIAGNOSIS — R7681 Abnormal rheumatoid factor and anti-citrullinated protein antibody without rheumatoid arthritis: Secondary | ICD-10-CM | POA: Diagnosis not present

## 2024-09-07 DIAGNOSIS — G8929 Other chronic pain: Secondary | ICD-10-CM

## 2024-09-07 DIAGNOSIS — Z6831 Body mass index (BMI) 31.0-31.9, adult: Secondary | ICD-10-CM | POA: Diagnosis not present

## 2024-09-07 DIAGNOSIS — M1991 Primary osteoarthritis, unspecified site: Secondary | ICD-10-CM | POA: Diagnosis not present

## 2024-09-07 DIAGNOSIS — M2559 Pain in other specified joint: Secondary | ICD-10-CM | POA: Diagnosis not present

## 2024-09-07 DIAGNOSIS — R5383 Other fatigue: Secondary | ICD-10-CM | POA: Diagnosis not present

## 2024-09-07 NOTE — Progress Notes (Signed)
 Second request for pre op orders left voicemail for [employer].

## 2024-09-07 NOTE — H&P (Signed)
 TOTAL KNEE ADMISSION H&P  Patient is being admitted for right total knee arthroplasty.  Subjective:  Chief Complaint:right knee pain.  HPI: Alexis Mcneil, 68 y.o. female, has a history of pain and functional disability in the right knee due to arthritis and has failed non-surgical conservative treatments for greater than 12 weeks to includeNSAID's and/or analgesics, corticosteriod injections, use of assistive devices, and activity modification.  Onset of symptoms was gradual, starting many years ago with gradually worsening course since that time. The patient noted prior procedures on the knee to include  arthroscopy on the right knee(s).  Patient currently rates pain in the right knee(s) at 10 out of 10 with activity. Patient has night pain, worsening of pain with activity and weight bearing, pain that interferes with activities of daily living, and pain with passive range of motion.  Patient has evidence of periarticular osteophytes and joint space narrowing by imaging studies.  There is no active infection.  Patient Active Problem List   Diagnosis Date Noted   B12 deficiency 08/17/2024   Primary osteoarthritis of right knee 12/04/2021   CKD (chronic kidney disease) stage 3, GFR 30-59 ml/min (HCC) 11/12/2021   Pre-ulcerative corn or callous 05/20/2020   Excessive sweating 05/20/2020   Gout 03/07/2018   Hereditary and idiopathic peripheral neuropathy 09/24/2015   Trochanteric bursitis of both hips 10/08/2014   Lumbar spinal stenosis 09/06/2014   Right knee pain 09/06/2014   Obstructive sleep apnea 09/26/2010   Left rotator cuff tear 03/18/2010   PARESTHESIA, HANDS 03/18/2010   Diabetes mellitus type 2 in obese 02/07/2008   Hypothyroidism 01/06/2007   Hyperlipidemia 01/06/2007   ANEMIA NOS 01/06/2007   HYPERTENSION, BENIGN ESSENTIAL 01/06/2007   Past Medical History:  Diagnosis Date   Anemia    Arthritis    KNEE,SHOULDERS   Cataract January 2021   Eye appt visit   Diabetes  mellitus without complication (HCC)    GERD (gastroesophageal reflux disease)    Heart murmur    Hyperlipidemia    Hypertension    IFG (impaired fasting glucose)    Neuromuscular disorder (HCC)    NEUROPATHY FEET   OSA (obstructive sleep apnea)    CPAP   Sleep apnea    CPAP   Thyroid  disease     Past Surgical History:  Procedure Laterality Date   COLONOSCOPY     endometrial polyp     January 2014   EYE SURGERY  1988   Cyst on eye removed   mouth palate surgery repair  1961   large tear   ROOT CANEL     2 WEEKS AGO   rt knee surgery arthroscopy  2009   torn meniscus/medial   torn ligament shoulder  1975   repaired    Current Outpatient Medications  Medication Sig Dispense Refill Last Dose/Taking   acetaminophen  (TYLENOL ) 500 MG tablet Take 500-1,000 mg by mouth every 6 (six) hours as needed (PAIN.).      allopurinol  (ZYLOPRIM ) 300 MG tablet TAKE ONE-HALF TABLET BY MOUTH  DAILY (Patient taking differently: Take 150 mg by mouth every evening.) 45 tablet 3    AMBULATORY NON FORMULARY MEDICATION Medication Name: CPAP machine and supplies with humidifier.  Please set her CPAP to 9 cm water pressure. Then download after one week so can review her apneas on 9 cm water pressure.  [facility]. Dx. OSA G47.33 1 vial 0    atorvastatin  (LIPITOR) 40 MG tablet TAKE 1 TABLET BY MOUTH IN THE  EVENING AT 6  PM 90 tablet 3    Blood Glucose Monitoring Suppl (FREESTYLE LITE) DEVI       chlorthalidone  (HYGROTON ) 25 MG tablet Take 1 tablet (25 mg total) by mouth daily. 1 tablet     DULoxetine  (CYMBALTA ) 60 MG capsule TAKE 1 CAPSULE BY MOUTH DAILY 90 capsule 3    gabapentin  (NEURONTIN ) 100 MG capsule Take 1 capsule (100 mg total) by mouth 3 (three) times daily. 90 capsule 1    glucose blood test strip Freestyle Lite test strips.  FOR CHECKING BLOOD SUGAR ONCE DAILY 100 each PRN    ibuprofen  (ADVIL ) 800 MG tablet Take 1 tablet (800 mg total) by mouth every 8 (eight) hours as needed. 90 tablet 2     Lancets Misc. (ACCU-CHEK FASTCLIX LANCET) KIT Use to check blood sugar once daily. Dx code: E11.69 1 kit 1    levothyroxine  (SYNTHROID ) 137 MCG tablet TAKE 1 TABLET BY MOUTH ONCE  DAILY BEFORE BREAKFAST 90 tablet 3    losartan  (COZAAR ) 100 MG tablet TAKE 1 TABLET BY MOUTH ONCE  DAILY 90 tablet 3    MAGNESIUM  OXIDE PO Take 400 mg by mouth in the morning.      meloxicam  (MOBIC ) 15 MG tablet TAKE 1 TABLET BY MOUTH ONCE  DAILY 90 tablet 3    methylPREDNISolone  (MEDROL ) 4 MG TBPK tablet Take 6 tablets 1st day, 5 tablets 2nd day, 4 tablets 3rd day, 3 tablets 4th day, 2 tablets 5th day, 1 tablet the 6th day 21 tablet 0    pantoprazole  (PROTONIX ) 40 MG tablet TAKE 1 TABLET BY MOUTH DAILY 90 tablet 3    potassium chloride  SA (KLOR-CON  M) 20 MEQ tablet Take 2 tablets (40 mEq total) by mouth daily. (Patient taking differently: Take 20 mEq by mouth 2 (two) times daily.) 90 tablet 3    Pyridoxine HCl (B-6 PO) Take 1 tablet by mouth in the morning.      Semaglutide , 2 MG/DOSE, 8 MG/3ML SOPN Inject 2 mg as directed once a week. (Patient taking differently: Inject 2 mg as directed every Sunday.) 9 mL 2    Current Facility-Administered Medications  Medication Dose Route Frequency Provider Last Rate Last Admin   [START ON 09/15/2024] cyanocobalamin (VITAMIN B12) injection 1,000 mcg  1,000 mcg Intramuscular Q14 Days [name], MD       Allergies  Allergen Reactions   Gabapentin  Other (See Comments)    Vision changes   Niacin Other (See Comments)    Threw up and passed out   Niacin And Related Nausea And Vomiting    Passed out and threw up    Social History   Tobacco Use   Smoking status: Never   Smokeless tobacco: Never  Substance Use Topics   Alcohol use: Yes    Alcohol/week: 2.0 standard drinks of alcohol    Types: 2 Cans of beer per week    Comment: occasional beer on the weekends.      Family History  Problem Relation Age of Onset   Colon cancer Father 53       colon/ polyps    Heart attack Father    Stroke Father    Cancer Father    Diabetes Father    Hyperlipidemia Mother        Living   Hypertension Mother    Arthritis Mother    Colon cancer Paternal Grandmother    Stroke Paternal Grandmother    Heart attack Paternal Grandmother        heart  attack   Cancer Paternal Grandmother    Diabetes Paternal Grandmother    Diabetes Other        grandmother   Supraventricular tachycardia Sister    Hyperlipidemia Maternal Grandmother    Varicose Veins Maternal Grandmother    Cancer Paternal Aunt    Cancer Paternal Uncle    Esophageal cancer Neg Hx    Stomach cancer Neg Hx    Rectal cancer Neg Hx    Breast cancer Neg Hx      Review of Systems  Musculoskeletal:  Positive for arthralgias.  All other systems reviewed and are negative.   Objective:  Physical Exam Constitutional:      General: She is not in acute distress.    Appearance: Normal appearance. She is not ill-appearing.  HENT:     Head: Normocephalic and atraumatic.     Right Ear: External ear normal.     Left Ear: External ear normal.     Nose: Nose normal.     Mouth/Throat:     Mouth: Mucous membranes are moist.     Pharynx: Oropharynx is clear.  Eyes:     Extraocular Movements: Extraocular movements intact.     Conjunctiva/sclera: Conjunctivae normal.  Cardiovascular:     Rate and Rhythm: Normal rate.     Pulses: Normal pulses.  Pulmonary:     Effort: Pulmonary effort is normal.  Abdominal:     General: Bowel sounds are normal.     Palpations: Abdomen is soft.  Musculoskeletal:        General: Tenderness present.     Cervical back: Normal range of motion and neck supple.     Comments: TTP over medial and lateral joint line, medial worse than lateral.  No calf tenderness, swelling, or erythema.  No overlying lesions of area of chief complaint.  Decreased strength and ROM due to elicited pain.  Dorsiflexion and plantarflexion intact.  Stable to varus and valgus stress.  BLE appear  grossly neurovascularly intact.  Gait mildly antalgic.   Skin:    General: Skin is warm and dry.  Neurological:     Mental Status: She is alert and oriented to person, place, and time. Mental status is at baseline.  Psychiatric:        Mood and Affect: Mood normal.        Behavior: Behavior normal.     Vital signs in last 24 hours: @VSRANGES @  Labs:   Estimated body mass index is 31.98 kg/m as calculated from the following:   Height as of 08/17/24: 5' 5 (1.651 m).   Weight as of 08/17/24: 87.2 kg.   Imaging Review Plain radiographs demonstrate severe degenerative joint disease of the right knee(s). The overall alignment issignificant varus. The bone quality appears to be fair for age and reported activity level.      Assessment/Plan:  End stage arthritis, right knee   The patient history, physical examination, clinical judgment of the provider and imaging studies are consistent with end stage degenerative joint disease of the right knee(s) and total knee arthroplasty is deemed medically necessary. The treatment options including medical management, injection therapy arthroscopy and arthroplasty were discussed at length. The risks and benefits of total knee arthroplasty were presented and reviewed. The risks due to aseptic loosening, infection, stiffness, patella tracking problems, thromboembolic complications and other imponderables were discussed. The patient acknowledged the explanation, agreed to proceed with the plan and consent was signed. Patient is being admitted for inpatient treatment for surgery, pain  control, PT, OT, prophylactic antibiotics, VTE prophylaxis, progressive ambulation and ADL's and discharge planning. The patient is planning to be discharged home with OPPT     Patient's anticipated LOS is less than 2 midnights, meeting these requirements: - Lives within 1 hour of care - Has a competent adult at home to recover with post-op recover - NO history of  -  Chronic pain requiring opiods  - Coronary Artery Disease  - Heart failure  - Heart attack  - Stroke  - DVT/VTE  - Cardiac arrhythmia  - Respiratory Failure/COPD  - Renal failure  - Anemia  - Advanced Liver disease

## 2024-09-07 NOTE — Progress Notes (Addendum)
 COVID Vaccine Completed:  Date of COVID positive in last 90 days:  PCP - Dorothyann Byars, MD Cardiologist - n/a  Chest x-ray - N/A EKG - 09/11/24 Epic/chart Stress Test - N/A ECHO - N/A Cardiac Cath - n/a Pacemaker/ICD device last checked:N/A Spinal Cord Stimulator:N/A  Bowel Prep - N/A  Sleep Study - yes CPAP - yes, every night   Fasting Blood Sugar - 90-120 Checks Blood Sugar 1 times a day  Last dose of GLP1 agonist-  Ozempic , takes on Sundays GLP1 instructions:  Do not take after 09/12/24     Last dose of SGLT-2 inhibitors-  N/A SGLT-2 instructions:  Do not take after     Blood Thinner Instructions: N/A Last dose:   Time: Aspirin Instructions:N/A Last Dose:  Activity level: Can go up a flight of stairs and perform activities of daily living without stopping and without symptoms of chest pain or shortness of breath. Has cane for out of house.  Anesthesia review: HTN, OSA, DM2, anemia, CKD, heart murmur  Patient denies shortness of breath, fever, cough and chest pain at PAT appointment  Patient verbalized understanding of instructions that were given to them at the PAT appointment. Patient was also instructed that they will need to review over the PAT instructions again at home before surgery.

## 2024-09-07 NOTE — H&P (View-Only) (Signed)
 TOTAL KNEE ADMISSION H&P  Patient is being admitted for right total knee arthroplasty.  Subjective:  Chief Complaint:right knee pain.  HPI: Alexis Mcneil, 68 y.o. female, has a history of pain and functional disability in the right knee due to arthritis and has failed non-surgical conservative treatments for greater than 12 weeks to includeNSAID's and/or analgesics, corticosteriod injections, use of assistive devices, and activity modification.  Onset of symptoms was gradual, starting many years ago with gradually worsening course since that time. The patient noted prior procedures on the knee to include  arthroscopy on the right knee(s).  Patient currently rates pain in the right knee(s) at 10 out of 10 with activity. Patient has night pain, worsening of pain with activity and weight bearing, pain that interferes with activities of daily living, and pain with passive range of motion.  Patient has evidence of periarticular osteophytes and joint space narrowing by imaging studies.  There is no active infection.  Patient Active Problem List   Diagnosis Date Noted   B12 deficiency 08/17/2024   Primary osteoarthritis of right knee 12/04/2021   CKD (chronic kidney disease) stage 3, GFR 30-59 ml/min (HCC) 11/12/2021   Pre-ulcerative corn or callous 05/20/2020   Excessive sweating 05/20/2020   Gout 03/07/2018   Hereditary and idiopathic peripheral neuropathy 09/24/2015   Trochanteric bursitis of both hips 10/08/2014   Lumbar spinal stenosis 09/06/2014   Right knee pain 09/06/2014   Obstructive sleep apnea 09/26/2010   Left rotator cuff tear 03/18/2010   PARESTHESIA, HANDS 03/18/2010   Diabetes mellitus type 2 in obese 02/07/2008   Hypothyroidism 01/06/2007   Hyperlipidemia 01/06/2007   ANEMIA NOS 01/06/2007   HYPERTENSION, BENIGN ESSENTIAL 01/06/2007   Past Medical History:  Diagnosis Date   Anemia    Arthritis    KNEE,SHOULDERS   Cataract January 2021   Eye appt visit   Diabetes  mellitus without complication (HCC)    GERD (gastroesophageal reflux disease)    Heart murmur    Hyperlipidemia    Hypertension    IFG (impaired fasting glucose)    Neuromuscular disorder (HCC)    NEUROPATHY FEET   OSA (obstructive sleep apnea)    CPAP   Sleep apnea    CPAP   Thyroid  disease     Past Surgical History:  Procedure Laterality Date   COLONOSCOPY     endometrial polyp     January 2014   EYE SURGERY  1988   Cyst on eye removed   mouth palate surgery repair  1961   large tear   ROOT CANEL     2 WEEKS AGO   rt knee surgery arthroscopy  2009   torn meniscus/medial   torn ligament shoulder  1975   repaired    Current Outpatient Medications  Medication Sig Dispense Refill Last Dose/Taking   acetaminophen  (TYLENOL ) 500 MG tablet Take 500-1,000 mg by mouth every 6 (six) hours as needed (PAIN.).      allopurinol  (ZYLOPRIM ) 300 MG tablet TAKE ONE-HALF TABLET BY MOUTH  DAILY (Patient taking differently: Take 150 mg by mouth every evening.) 45 tablet 3    AMBULATORY NON FORMULARY MEDICATION Medication Name: CPAP machine and supplies with humidifier.  Please set her CPAP to 9 cm water pressure. Then download after one week so can review her apneas on 9 cm water pressure.  [facility]. Dx. OSA G47.33 1 vial 0    atorvastatin  (LIPITOR) 40 MG tablet TAKE 1 TABLET BY MOUTH IN THE  EVENING AT 6  PM 90 tablet 3    Blood Glucose Monitoring Suppl (FREESTYLE LITE) DEVI       chlorthalidone  (HYGROTON ) 25 MG tablet Take 1 tablet (25 mg total) by mouth daily. 1 tablet     DULoxetine  (CYMBALTA ) 60 MG capsule TAKE 1 CAPSULE BY MOUTH DAILY 90 capsule 3    gabapentin  (NEURONTIN ) 100 MG capsule Take 1 capsule (100 mg total) by mouth 3 (three) times daily. 90 capsule 1    glucose blood test strip Freestyle Lite test strips.  FOR CHECKING BLOOD SUGAR ONCE DAILY 100 each PRN    ibuprofen  (ADVIL ) 800 MG tablet Take 1 tablet (800 mg total) by mouth every 8 (eight) hours as needed. 90 tablet 2     Lancets Misc. (ACCU-CHEK FASTCLIX LANCET) KIT Use to check blood sugar once daily. Dx code: E11.69 1 kit 1    levothyroxine  (SYNTHROID ) 137 MCG tablet TAKE 1 TABLET BY MOUTH ONCE  DAILY BEFORE BREAKFAST 90 tablet 3    losartan  (COZAAR ) 100 MG tablet TAKE 1 TABLET BY MOUTH ONCE  DAILY 90 tablet 3    MAGNESIUM  OXIDE PO Take 400 mg by mouth in the morning.      meloxicam  (MOBIC ) 15 MG tablet TAKE 1 TABLET BY MOUTH ONCE  DAILY 90 tablet 3    methylPREDNISolone  (MEDROL ) 4 MG TBPK tablet Take 6 tablets 1st day, 5 tablets 2nd day, 4 tablets 3rd day, 3 tablets 4th day, 2 tablets 5th day, 1 tablet the 6th day 21 tablet 0    pantoprazole  (PROTONIX ) 40 MG tablet TAKE 1 TABLET BY MOUTH DAILY 90 tablet 3    potassium chloride  SA (KLOR-CON  M) 20 MEQ tablet Take 2 tablets (40 mEq total) by mouth daily. (Patient taking differently: Take 20 mEq by mouth 2 (two) times daily.) 90 tablet 3    Pyridoxine HCl (B-6 PO) Take 1 tablet by mouth in the morning.      Semaglutide , 2 MG/DOSE, 8 MG/3ML SOPN Inject 2 mg as directed once a week. (Patient taking differently: Inject 2 mg as directed every Sunday.) 9 mL 2    Current Facility-Administered Medications  Medication Dose Route Frequency Provider Last Rate Last Admin   [START ON 09/15/2024] cyanocobalamin (VITAMIN B12) injection 1,000 mcg  1,000 mcg Intramuscular Q14 Days [name], MD       Allergies  Allergen Reactions   Gabapentin  Other (See Comments)    Vision changes   Niacin Other (See Comments)    Threw up and passed out   Niacin And Related Nausea And Vomiting    Passed out and threw up    Social History   Tobacco Use   Smoking status: Never   Smokeless tobacco: Never  Substance Use Topics   Alcohol use: Yes    Alcohol/week: 2.0 standard drinks of alcohol    Types: 2 Cans of beer per week    Comment: occasional beer on the weekends.      Family History  Problem Relation Age of Onset   Colon cancer Father 53       colon/ polyps    Heart attack Father    Stroke Father    Cancer Father    Diabetes Father    Hyperlipidemia Mother        Living   Hypertension Mother    Arthritis Mother    Colon cancer Paternal Grandmother    Stroke Paternal Grandmother    Heart attack Paternal Grandmother        heart  attack   Cancer Paternal Grandmother    Diabetes Paternal Grandmother    Diabetes Other        grandmother   Supraventricular tachycardia Sister    Hyperlipidemia Maternal Grandmother    Varicose Veins Maternal Grandmother    Cancer Paternal Aunt    Cancer Paternal Uncle    Esophageal cancer Neg Hx    Stomach cancer Neg Hx    Rectal cancer Neg Hx    Breast cancer Neg Hx      Review of Systems  Musculoskeletal:  Positive for arthralgias.  All other systems reviewed and are negative.   Objective:  Physical Exam Constitutional:      General: She is not in acute distress.    Appearance: Normal appearance. She is not ill-appearing.  HENT:     Head: Normocephalic and atraumatic.     Right Ear: External ear normal.     Left Ear: External ear normal.     Nose: Nose normal.     Mouth/Throat:     Mouth: Mucous membranes are moist.     Pharynx: Oropharynx is clear.  Eyes:     Extraocular Movements: Extraocular movements intact.     Conjunctiva/sclera: Conjunctivae normal.  Cardiovascular:     Rate and Rhythm: Normal rate.     Pulses: Normal pulses.  Pulmonary:     Effort: Pulmonary effort is normal.  Abdominal:     General: Bowel sounds are normal.     Palpations: Abdomen is soft.  Musculoskeletal:        General: Tenderness present.     Cervical back: Normal range of motion and neck supple.     Comments: TTP over medial and lateral joint line, medial worse than lateral.  No calf tenderness, swelling, or erythema.  No overlying lesions of area of chief complaint.  Decreased strength and ROM due to elicited pain.  Dorsiflexion and plantarflexion intact.  Stable to varus and valgus stress.  BLE appear  grossly neurovascularly intact.  Gait mildly antalgic.   Skin:    General: Skin is warm and dry.  Neurological:     Mental Status: She is alert and oriented to person, place, and time. Mental status is at baseline.  Psychiatric:        Mood and Affect: Mood normal.        Behavior: Behavior normal.     Vital signs in last 24 hours: @VSRANGES @  Labs:   Estimated body mass index is 31.98 kg/m as calculated from the following:   Height as of 08/17/24: 5' 5 (1.651 m).   Weight as of 08/17/24: 87.2 kg.   Imaging Review Plain radiographs demonstrate severe degenerative joint disease of the right knee(s). The overall alignment issignificant varus. The bone quality appears to be fair for age and reported activity level.      Assessment/Plan:  End stage arthritis, right knee   The patient history, physical examination, clinical judgment of the provider and imaging studies are consistent with end stage degenerative joint disease of the right knee(s) and total knee arthroplasty is deemed medically necessary. The treatment options including medical management, injection therapy arthroscopy and arthroplasty were discussed at length. The risks and benefits of total knee arthroplasty were presented and reviewed. The risks due to aseptic loosening, infection, stiffness, patella tracking problems, thromboembolic complications and other imponderables were discussed. The patient acknowledged the explanation, agreed to proceed with the plan and consent was signed. Patient is being admitted for inpatient treatment for surgery, pain  control, PT, OT, prophylactic antibiotics, VTE prophylaxis, progressive ambulation and ADL's and discharge planning. The patient is planning to be discharged home with OPPT     Patient's anticipated LOS is less than 2 midnights, meeting these requirements: - Lives within 1 hour of care - Has a competent adult at home to recover with post-op recover - NO history of  -  Chronic pain requiring opiods  - Coronary Artery Disease  - Heart failure  - Heart attack  - Stroke  - DVT/VTE  - Cardiac arrhythmia  - Respiratory Failure/COPD  - Renal failure  - Anemia  - Advanced Liver disease

## 2024-09-07 NOTE — Patient Instructions (Addendum)
 SURGICAL WAITING ROOM VISITATION  Patients having surgery or a procedure may have no more than 2 support people in the waiting area - these visitors may rotate.    Children under the age of 48 must have an adult with them who is not the patient.  Visitors with respiratory illnesses are discouraged from visiting and should remain at home.  If the patient needs to stay at the hospital during part of their recovery, the visitor guidelines for inpatient rooms apply. Pre-op nurse will coordinate an appropriate time for 1 support person to accompany patient in pre-op.  This support person may not rotate.    Please refer to the [facility] website for the visitor guidelines for Inpatients (after your surgery is over and you are in a regular room).    Your procedure is scheduled on: 09/20/24   Report to [facility] Main Entrance    Report to admitting at 7:15 AM   Call this number if you have problems the morning of surgery [phone]   Do not eat food :After Midnight.   After Midnight you may have the following liquids until 6:45 AM DAY OF SURGERY  Water Non-Citrus Juices (without pulp, NO RED-Apple, White grape, White cranberry) Black Coffee (NO MILK/CREAM OR CREAMERS, sugar ok)  Clear Tea (NO MILK/CREAM OR CREAMERS, sugar ok) regular and decaf                             Plain Jell-O (NO RED)                                           Fruit ices (not with fruit pulp, NO RED)                                     Popsicles (NO RED)                                                               Sports drinks like Gatorade (NO RED)                 The day of surgery:  Drink ONE (1) Pre-Surgery G2 at 6:45 AM the morning of surgery. Drink in one sitting. Do not sip.  This drink was given to you during your hospital  pre-op appointment visit. Nothing else to drink after completing the  Pre-Surgery G2.          If you have questions, please contact your surgeon's  office.   FOLLOW BOWEL PREP AND ANY ADDITIONAL PRE OP INSTRUCTIONS YOU RECEIVED FROM YOUR SURGEON'S OFFICE!!!     Oral Hygiene is also important to reduce your risk of infection.                                    Remember - BRUSH YOUR TEETH THE MORNING OF SURGERY WITH YOUR REGULAR TOOTHPASTE  DENTURES WILL BE REMOVED PRIOR TO SURGERY PLEASE DO NOT APPLY Poly grip OR ADHESIVES!!!  Stop all vitamins and herbal supplements 7 days before surgery.   Take these medicines the morning of surgery with A SIP OF WATER: Tylenol , Duloxetine , Gabapentin , Levothyroxine , Pantoprazole    DO NOT TAKE ANY ORAL DIABETIC MEDICATIONS DAY OF YOUR SURGERY  How to Manage Your Diabetes Before and After Surgery  Why is it important to control my blood sugar before and after surgery? Improving blood sugar levels before and after surgery helps healing and can limit problems. A way of improving blood sugar control is eating a healthy diet by:  Eating less sugar and carbohydrates  Increasing activity/exercise  Talking with your doctor about reaching your blood sugar goals High blood sugars (greater than 180 mg/dL) can raise your risk of infections and slow your recovery, so you will need to focus on controlling your diabetes during the weeks before surgery. Make sure that the doctor who takes care of your diabetes knows about your planned surgery including the date and location.  How do I manage my blood sugar before surgery? Check your blood sugar at least 4 times a day, starting 2 days before surgery, to make sure that the level is not too high or low. Check your blood sugar the morning of your surgery when you wake up and every 2 hours until you get to the Short Stay unit. If your blood sugar is less than 70 mg/dL, you will need to treat for low blood sugar: Do not take insulin. Treat a low blood sugar (less than 70 mg/dL) with  cup of clear juice (cranberry or apple), 4 glucose tablets, OR glucose  gel. Recheck blood sugar in 15 minutes after treatment (to make sure it is greater than 70 mg/dL). If your blood sugar is not greater than 70 mg/dL on recheck, call [phone] for further instructions. Report your blood sugar to the short stay nurse when you get to Short Stay.  If you are admitted to the hospital after surgery: Your blood sugar will be checked by the staff and you will probably be given insulin after surgery (instead of oral diabetes medicines) to make sure you have good blood sugar levels. The goal for blood sugar control after surgery is 80-180 mg/dL.   WHAT DO I DO ABOUT MY DIABETES MEDICATION?  Do not take oral diabetes medicines (pills) the morning of surgery.  Do not take Ozempic  after 09/12/24.  DO NOT TAKE THE FOLLOWING 7 DAYS PRIOR TO SURGERY: Ozempic , Wegovy , Rybelsus  (Semaglutide ), Byetta (exenatide), Bydureon (exenatide ER), Victoza, Saxenda (liraglutide), or Trulicity (dulaglutide) Mounjaro  (Tirzepatide ) Adlyxin (Lixisenatide), Polyethylene Glycol Loxenatide.   Reviewed and Endorsed by [facility] Patient Education Committee, August 2015  Bring CPAP mask and tubing day of surgery.                              You may not have any metal on your body including hair pins, jewelry, and body piercing             Do not wear make-up, lotions, powders, perfumes, or deodorant  Do not wear nail polish including gel and S&S, artificial/acrylic nails, or any other type of covering on natural nails including finger and toenails. If you have artificial nails, gel coating, etc. that needs to be removed by a nail salon please have this removed prior to surgery or surgery may need to be canceled/ delayed if the surgeon/ anesthesia feels like they are unable to be safely monitored.   Do  not shave  48 hours prior to surgery.    Do not bring valuables to the hospital. [facility] IS NOT             RESPONSIBLE   FOR VALUABLES.   Contacts, glasses, dentures or bridgework  may not be worn into surgery.  DO NOT BRING YOUR HOME MEDICATIONS TO THE HOSPITAL. PHARMACY WILL DISPENSE MEDICATIONS LISTED ON YOUR MEDICATION LIST TO YOU DURING YOUR ADMISSION IN THE HOSPITAL!    Patients discharged on the day of surgery will not be allowed to drive home.  Someone NEEDS to stay with you for the first 24 hours after anesthesia.   Special Instructions: Bring a copy of your healthcare power of attorney and living will documents the day of surgery if you haven't scanned them before.              Please read over the following fact sheets you were given: IF YOU HAVE QUESTIONS ABOUT YOUR PRE-OP INSTRUCTIONS PLEASE CALL [phone]-Bernell Haynie   If you received a COVID test during your pre-op visit  it is requested that you wear a mask when out in public, stay away from anyone that may not be feeling well and notify your surgeon if you develop symptoms. If you test positive for Covid or have been in contact with anyone that has tested positive in the last 10 days please notify you surgeon.      Pre-operative 4 CHG Bath Instructions  DYNA-Hex 4 Chlorhexidine Gluconate 4% Solution Antiseptic 4 fl. oz   You can play a key role in reducing the risk of infection after surgery. Your skin needs to be as free of germs as possible. You can reduce the number of germs on your skin by washing with CHG (chlorhexidine gluconate) soap before surgery. CHG is an antiseptic soap that kills germs and continues to kill germs even after washing.   DO NOT use if you have an allergy  to chlorhexidine/CHG or antibacterial soaps. If your skin becomes reddened or irritated, stop using the CHG and notify one of our RNs at   Please shower with the CHG soap starting 4 days before surgery using the following schedule:     Please keep in mind the following:  DO NOT shave, including legs and underarms, starting the day of your first shower.   You may shave your face at any point before/day of surgery.  Place  clean sheets on your bed the day you start using CHG soap. Use a clean washcloth (not used since being washed) for each shower. DO NOT sleep with pets once you start using the CHG.  CHG Shower Instructions:  If you choose to wash your hair and private area, wash first with your normal shampoo/soap.  After you use shampoo/soap, rinse your hair and body thoroughly to remove shampoo/soap residue.  Turn the water OFF and apply about 3 tablespoons (45 ml) of CHG soap to a CLEAN washcloth.  Apply CHG soap ONLY FROM YOUR NECK DOWN TO YOUR TOES (washing for 3-5 minutes)  DO NOT use CHG soap on face, private areas, open wounds, or sores.  Pay special attention to the area where your surgery is being performed.  If you are having back surgery, having someone wash your back for you may be helpful. Wait 2 minutes after CHG soap is applied, then you may rinse off the CHG soap.  Pat dry with a clean towel  Put on clean clothes/pajamas   If you choose to wear  lotion, please use ONLY the CHG-compatible lotions on the back of this paper.     Additional instructions for the day of surgery: DO NOT APPLY any lotions, deodorants, cologne, or perfumes.   Put on clean/comfortable clothes.  Brush your teeth.  Ask your nurse before applying any prescription medications to the skin.   CHG Compatible Lotions   Aveeno Moisturizing lotion  Cetaphil Moisturizing Cream  Cetaphil Moisturizing Lotion  Clairol Herbal Essence Moisturizing Lotion, Dry Skin  Clairol Herbal Essence Moisturizing Lotion, Extra Dry Skin  Clairol Herbal Essence Moisturizing Lotion, Normal Skin  Curel Age Defying Therapeutic Moisturizing Lotion with Alpha Hydroxy  Curel Extreme Care Body Lotion  Curel Soothing Hands Moisturizing Hand Lotion  Curel Therapeutic Moisturizing Cream, Fragrance-Free  Curel Therapeutic Moisturizing Lotion, Fragrance-Free  Curel Therapeutic Moisturizing Lotion, Original Formula  Eucerin Daily Replenishing  Lotion  Eucerin Dry Skin Therapy Plus Alpha Hydroxy Crme  Eucerin Dry Skin Therapy Plus Alpha Hydroxy Lotion  Eucerin Original Crme  Eucerin Original Lotion  Eucerin Plus Crme Eucerin Plus Lotion  Eucerin TriLipid Replenishing Lotion  Keri Anti-Bacterial Hand Lotion  Keri Deep Conditioning Original Lotion Dry Skin Formula Softly Scented  Keri Deep Conditioning Original Lotion, Fragrance Free Sensitive Skin Formula  Keri Lotion Fast Absorbing Fragrance Free Sensitive Skin Formula  Keri Lotion Fast Absorbing Softly Scented Dry Skin Formula  Keri Original Lotion  Keri Skin Renewal Lotion Keri Silky Smooth Lotion  Keri Silky Smooth Sensitive Skin Lotion  Nivea Body Creamy Conditioning Oil  Nivea Body Extra Enriched Lotion  Nivea Body Original Lotion  Nivea Body Sheer Moisturizing Lotion Nivea Crme  Nivea Skin Firming Lotion  NutraDerm 30 Skin Lotion  NutraDerm Skin Lotion  NutraDerm Therapeutic Skin Cream  NutraDerm Therapeutic Skin Lotion  ProShield Protective Hand Cream  Provon moisturizing lotion  View Pre-Surgery Education Videos:  [email or web address]     Incentive Spirometer  An incentive spirometer is a tool that can help keep your lungs clear and active. This tool measures how well you are filling your lungs with each breath. Taking long deep breaths may help reverse or decrease the chance of developing breathing (pulmonary) problems (especially infection) following: A long period of time when you are unable to move or be active. BEFORE THE PROCEDURE  If the spirometer includes an indicator to show your best effort, your nurse or respiratory therapist will set it to a desired goal. If possible, sit up straight or lean slightly forward. Try not to slouch. Hold the incentive spirometer in an upright position. INSTRUCTIONS FOR USE  Sit on the edge of your bed if possible, or sit up as far as you can in bed or on  a chair. Hold the incentive spirometer in an upright position. Breathe out normally. Place the mouthpiece in your mouth and seal your lips tightly around it. Breathe in slowly and as deeply as possible, raising the piston or the ball toward the top of the column. Hold your breath for 3-5 seconds or for as long as possible. Allow the piston or ball to fall to the bottom of the column. Remove the mouthpiece from your mouth and breathe out normally. Rest for a few seconds and repeat Steps 1 through 7 at least 10 times every 1-2 hours when you are awake. Take your time and take a few normal breaths between deep breaths. The spirometer may include an indicator to show your best effort. Use the indicator as a goal to work toward during each repetition. After each set of  10 deep breaths, practice coughing to be sure your lungs are clear. If you have an incision (the cut made at the time of surgery), support your incision when coughing by placing a pillow or rolled up towels firmly against it. Once you are able to get out of bed, walk around indoors and cough well. You may stop using the incentive spirometer when instructed by your caregiver.  RISKS AND COMPLICATIONS Take your time so you do not get dizzy or light-headed. If you are in pain, you may need to take or ask for pain medication before doing incentive spirometry. It is harder to take a deep breath if you are having pain. AFTER USE Rest and breathe slowly and easily. It can be helpful to keep track of a log of your progress. Your caregiver can provide you with a simple table to help with this. If you are using the spirometer at home, follow these instructions: SEEK MEDICAL CARE IF:  You are having difficultly using the spirometer. You have trouble using the spirometer as often as instructed. Your pain medication is not giving enough relief while using the spirometer. You develop fever of 100.5 F (38.1 C) or higher. SEEK IMMEDIATE MEDICAL  CARE IF:  You cough up bloody sputum that had not been present before. You develop fever of 102 F (38.9 C) or greater. You develop worsening pain at or near the incision site. MAKE SURE YOU:  Understand these instructions. Will watch your condition. Will get help right away if you are not doing well or get worse. Document Released: 03/08/2007 Document Revised: 01/18/2012 Document Reviewed: 05/09/2007 [facility] Patient Information 2014 [place], [place].   ________________________________________________________________________

## 2024-09-08 ENCOUNTER — Ambulatory Visit

## 2024-09-08 DIAGNOSIS — R2689 Other abnormalities of gait and mobility: Secondary | ICD-10-CM

## 2024-09-08 DIAGNOSIS — M25562 Pain in left knee: Secondary | ICD-10-CM | POA: Diagnosis not present

## 2024-09-08 DIAGNOSIS — M6281 Muscle weakness (generalized): Secondary | ICD-10-CM

## 2024-09-08 DIAGNOSIS — M25561 Pain in right knee: Secondary | ICD-10-CM | POA: Diagnosis not present

## 2024-09-08 DIAGNOSIS — G8929 Other chronic pain: Secondary | ICD-10-CM | POA: Diagnosis not present

## 2024-09-08 NOTE — Therapy (Signed)
 OUTPATIENT PHYSICAL THERAPY LOWER EXTREMITY TREATMENT   Patient Name: Alexis Mcneil MRN: [id number] DOB:[birth year], 68 y.o., female Today's Date: 09/08/2024  END OF SESSION:  PT End of Session - 09/08/24 0931     Visit Number 2    Number of Visits 5    Date for Recertification  09/19/24    Authorization Type Medicare Part A and B    PT Start Time 0931    PT Stop Time 1015    PT Time Calculation (min) 44 min    Activity Tolerance Patient tolerated treatment well    Behavior During Therapy [facility] for tasks assessed/performed           Past Medical History:  Diagnosis Date   Anemia    Arthritis    KNEE,SHOULDERS   Cataract January 2021   Eye appt visit   Diabetes mellitus without complication (HCC)    GERD (gastroesophageal reflux disease)    Heart murmur    Hyperlipidemia    Hypertension    IFG (impaired fasting glucose)    Neuromuscular disorder (HCC)    NEUROPATHY FEET   OSA (obstructive sleep apnea)    CPAP   Sleep apnea    CPAP   Thyroid  disease    Past Surgical History:  Procedure Laterality Date   COLONOSCOPY     endometrial polyp     January 2014   EYE SURGERY  1988   Cyst on eye removed   mouth palate surgery repair  1961   large tear   ROOT CANEL     2 WEEKS AGO   rt knee surgery arthroscopy  2009   torn meniscus/medial   torn ligament shoulder  1975   repaired   Patient Active Problem List   Diagnosis Date Noted   B12 deficiency 08/17/2024   Primary osteoarthritis of right knee 12/04/2021   CKD (chronic kidney disease) stage 3, GFR 30-59 ml/min (HCC) 11/12/2021   Pre-ulcerative corn or callous 05/20/2020   Excessive sweating 05/20/2020   Gout 03/07/2018   Hereditary and idiopathic peripheral neuropathy 09/24/2015   Trochanteric bursitis of both hips 10/08/2014   Lumbar spinal stenosis 09/06/2014   Right knee pain 09/06/2014   Obstructive sleep apnea 09/26/2010   Left rotator cuff tear 03/18/2010   PARESTHESIA, HANDS 03/18/2010    Diabetes mellitus type 2 in obese 02/07/2008   Hypothyroidism 01/06/2007   Hyperlipidemia 01/06/2007   ANEMIA NOS 01/06/2007   HYPERTENSION, BENIGN ESSENTIAL 01/06/2007    PCP: Dorothyann Byars, MD  REFERRING PROVIDER: Toribio Higashi, MD  REFERRING DIAG: ICD-10M17.0 Bilateral primary OA of knee  THERAPY DIAG:  Muscle weakness (generalized)  Other abnormalities of gait and mobility  Chronic pain of right knee  Chronic pain of left knee  Rationale for Evaluation and Treatment: Rehabilitation  ONSET DATE: End of August   SUBJECTIVE:   SUBJECTIVE STATEMENT: She saw rheumatology and wants to run additional test to determine if she has RA. Recommended that she try a drug regimen following her Rt knee surgery and to hold off on Lt knee surgery until drug treatment is tried. Feels ok, but the Rt knee is extremely sore.   EVAL: Pt reports she has been able to move independently, completing all chores and housework without problems until this past August 2025. She reports she has had 2 falls since then, losing her balance and feeling extremely weak in both legs. She states having a history of injuries in the right knee, and has been scheduled to have Right  TKR on Sep 20, 2024. She is concerned her left knee is weak, but is hopeful it will get her through the recovery process. She wants to do PT in preparation for her surgery the next 2 weeks. Reports having pseudogout, every joint hurts.  She currently ambulates with SPC.     PERTINENT HISTORY: Diabetes Neuropathy in feet  Paresthesia hands HTN  PAIN:  Are you having pain? Yes: NPRS scale: 3 Pain location: Rt knee Pain description: ache, throbbing, sore  Aggravating factors: sleeping, sitting, WB Relieving factors: LAQ, NSAID  PRECAUTIONS: Fall  RED FLAGS: None   WEIGHT BEARING RESTRICTIONS: No  FALLS:  Has patient fallen in last 6 months? No and Yes. 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  DIAGNOSTIC FINDINGS:   IMPRESSION: Right knee 1. Severe tricompartmental degenerative changes, as described above. 2. 2.7 cm loose body in the suprapatellar recess.  IMPRESSION: Left knee 1. Stable marked medial joint space degenerative changes and moderate tricompartmental degenerative changes. 2. Probable posterior ossified loose bodies.     PATIENT SURVEYS:  PSFS: THE PATIENT SPECIFIC FUNCTIONAL SCALE  Place score of 0-10 (0 = unable to perform activity and 10 = able to perform activity at the same level as before injury or problem)  Activity Date: 09/06/24    Sit to stand transfer without UE support  3    2. Tandem stance 30 sec  0    3.     4.      Total Score 1.5      Total Score = Sum of activity scores/number of activities  Minimally Detectable Change: 3 points (for single activity); 2 points (for average score)  [facility] (nd). The Patient Specific Functional Scale . Retrieved from [email or web address]   COGNITION: Overall cognitive status: Within functional limits for tasks assessed     SENSATION: Not tested    MUSCLE LENGTH: Bil hamstring tightness  POSTURE: rounded shoulders, forward head, and flexed trunk     LOWER EXTREMITY ROM:  Active ROM Right eval Left eval 09/08/24 Right   Hip flexion     Hip extension     Hip abduction     Hip adduction     Hip internal  rotation     Hip external rotation     Knee flexion 95 112 deg  96  Knee extension Lacking 11 deg  WFL   Ankle dorsiflexion     Ankle plantarflexion     Ankle inversion     Ankle eversion      (Blank rows = not tested)  LOWER EXTREMITY MMT:  MMT Right eval Left eval  Hip flexion 3+ 3+  Hip extension 4- 4-  Hip abduction 3+ pain 4+  Hip adduction    Hip internal rotation 4 4  Hip external rotation 4 4  Knee flexion 3 3+  Knee extension 5 4+  Ankle dorsiflexion 5 4  Ankle plantarflexion 5 4  Ankle inversion 4- 3  Ankle eversion  4- 4-   (Blank rows = not tested)    FUNCTIONAL TESTS:  5 times sit to stand: 19.92 sec  Timed up and go (TUG): 21.04 sec  Tandem stance 30 sec both sides- right leg behind required UE support   GAIT: Distance walked: 75 ft  Assistive device utilized: Single point cane Level of assistance: Modified independence Comments: Pt depends on her SPC to maintain balance while walking and tends to lean to left side during gait                                                                                                                                 [facility] Adult PT Treatment:                                                DATE: 09/08/24 Therapeutic Exercise: Seated knee extension AAROM x 10  Heel slides with strap x 10   Neuromuscular re-ed: SAQ 2 x 10  Seated hip abduction red band x 5 Seated march red band x 5   Self Care: Discussed typical rehab post-op progression, post-op expectations and AD that would likely be helpful Discussed community gym options that she could attend at appropriate timeline post-operatively    [facility] Adult PT Treatment:                                                DATE: 09/06/24 Therapeutic Exercise: See HEP Review HEP   PATIENT EDUCATION:  Education details: HEP update Person educated: Patient Education method: Explanation, Demonstration, Tactile cues, Verbal cues, and Handouts Education comprehension:  verbalized understanding, returned demonstration, verbal cues required, tactile cues required, and needs further education  HOME EXERCISE PROGRAM: Access Code: [email or web address] URL: [email or web address] Date: 09/08/2024 Prepared by: Lucie Meeter  Exercises - Seated Long Arc Quad  - 1 x daily - 7 x weekly - 3 sets - 10 reps - Seated Ankle Pumps on Table  - 1 x daily - 7 x weekly - 3 sets - 10 reps - Supine Heel Slide with Strap  - 2 x daily - 7 x weekly - 1 sets - 10 reps - Seated Knee Extension AAROM  - 1 x daily - 7 x weekly - 1 sets - 10 reps - Supine Short Arc Quad  - 1 x daily - 7 x weekly - 2 sets - 10 reps - Seated Hip Abduction with Resistance  - 1 x daily - 7 x weekly - 2 sets - 10 reps - Seated March with Resistance  - 1 x daily - 7 x weekly - 2 sets - 10 reps  ASSESSMENT:  CLINICAL IMPRESSION: Patient tolerated session well  focusing on improving Rt knee ROM and strength as she prepares for upcoming TKA. We also discussed expectations post-operatively and rehab progression with patient verbalizing understanding. Slight improvement in knee flexion AROM noted compared to eval.   EVAL: Patient is a 68 y.o. female who was seen today for physical therapy evaluation and treatment for prehab of bilateral knee OA with planned Rt TKA on 09/20/24. Pt reports having an ongoing history of injuries in her right knee and is finally having surgery to help improve functional capacity. She reports having a downward trajectory of events this past August 2025, stating she had 2 falls from loss of balance. She is concerned her left knee is unstable and weak, thus would like to strengthen it before surgery with PT exercises. She states having neuropathy in both feet create challenges for completing everyday activities. Upon assessment pt presents with significant LE weakness RT>Lt and decreased right knee flexion and extension mobility. She will benefit from skilled PT to address the above stated  deficits in order to better prepare for upcoming surgery and allow for optimal outcome.   OBJECTIVE IMPAIRMENTS: Abnormal gait, decreased activity tolerance, decreased balance, decreased coordination, decreased endurance, decreased mobility, difficulty walking, decreased ROM, decreased strength, increased fascial restrictions, impaired flexibility, improper body mechanics, postural dysfunction, and pain.   ACTIVITY LIMITATIONS: carrying, lifting, bending, sitting, standing, squatting, sleeping, stairs, and transfers  PARTICIPATION LIMITATIONS: meal prep, cleaning, laundry, medication management, driving, community activity, and occupation  PERSONAL FACTORS: Age, Fitness, Past/current experiences, Profession, Time since onset of injury/illness/exacerbation, and 3+ comorbidities: Bil feet neuropathy  are also affecting patient's functional outcome.   REHAB POTENTIAL: Good  CLINICAL DECISION MAKING: Evolving/moderate complexity  EVALUATION COMPLEXITY: Moderate   GOALS: Goals reviewed with patient? Yes  SHORT /LONG TERM GOALS: Target date: 09/19/24 Pt will be efficient in pre surgery HEP so she has optimizing post surgery recovery and decrease risk for injury Baseline: Goal status: INITIAL  2.  Pt will perform TUG in 15 sec or less to reduce fall risk.  Baseline: 19 sec  Goal status: INITIAL  3.  Pt will perform 5 sit to stands in 15 sec or less so she increases strength for walking Baseline: 21 sec  Goal status: INITIAL  4.  Pt will score 2 points higher on her PSPF score so she is a decreased fall risk  Baseline: see above Goal status: INITIAL  5.  Pt will lack 6 degrees or less in right knee extension so she ambulates with safe body mechanics Baseline: lacking 11 deg  Goal status: INITIAL       PLAN:  PT FREQUENCY: 1-2x/week  PT DURATION: 2 weeks  PLANNED INTERVENTIONS: 97750- Physical Performance Testing, 97110-Therapeutic exercises, 97530- Therapeutic activity,  [id number]- Neuromuscular re-education, 97535- Self Care, [id number]- Manual therapy, [id number]- Gait training, [phone]- Orthotic Initial, [phone]- Aquatic Therapy, [phone]- Electrical stimulation (manual), [phone] (1-2 muscles), [id number] (3+ muscles)- Dry Needling, Patient/Family education, Balance training, Stair training, Joint mobilization, Scar mobilization, DME instructions, Cryotherapy, and Moist heat  PLAN FOR NEXT SESSION: LAQ, hamstring stretch, quad set, ankle pumps; generalized LE strengthening and static balance activity to prepare for upcoming surgery. Stair negotiation for surgical prep.   Zasha Belleau, PT, DPT, ATC 09/08/24 10:18 AM

## 2024-09-08 NOTE — Care Plan (Signed)
 Ortho Bundle Case Management Note  Patient Details  Name: Alexis Mcneil MRN: [id number] Date of Birth: 1956-06-01  met with patient and SO in the office for H&P. will discharge to home with her assistance. has all DME. [facility]. discharge instructions discussed and questions answered.  Patient and MD in agreement with plan. Choice offered.                     DME Arranged:    DME Agency:     HH Arranged:    HH Agency:     Additional Comments: Please contact me with any questions of if this plan should need to change.  Charlies Pitch,  RN,BSN,MHA,CCM  [facility] Orthopaedic Specialist  [phone] 09/08/2024, 4:20 PM

## 2024-09-11 ENCOUNTER — Ambulatory Visit (INDEPENDENT_AMBULATORY_CARE_PROVIDER_SITE_OTHER)

## 2024-09-11 ENCOUNTER — Encounter (HOSPITAL_COMMUNITY)
Admission: RE | Admit: 2024-09-11 | Discharge: 2024-09-11 | Disposition: A | Discharge status: Home / Self Care | Source: Ambulatory Visit | Attending: Orthopedic Surgery | Admitting: Orthopedic Surgery

## 2024-09-11 ENCOUNTER — Encounter (HOSPITAL_COMMUNITY): Payer: Self-pay

## 2024-09-11 ENCOUNTER — Other Ambulatory Visit: Payer: Self-pay

## 2024-09-11 VITALS — BP 112/59 | HR 88

## 2024-09-11 VITALS — BP 136/75 | HR 78 | Temp 98.0°F | Resp 16 | Ht 65.0 in | Wt 187.0 lb

## 2024-09-11 DIAGNOSIS — M25561 Pain in right knee: Secondary | ICD-10-CM | POA: Insufficient documentation

## 2024-09-11 DIAGNOSIS — G8929 Other chronic pain: Secondary | ICD-10-CM | POA: Insufficient documentation

## 2024-09-11 DIAGNOSIS — E119 Type 2 diabetes mellitus without complications: Secondary | ICD-10-CM | POA: Insufficient documentation

## 2024-09-11 DIAGNOSIS — Z01818 Encounter for other preprocedural examination: Secondary | ICD-10-CM | POA: Insufficient documentation

## 2024-09-11 DIAGNOSIS — E538 Deficiency of other specified B group vitamins: Secondary | ICD-10-CM

## 2024-09-11 LAB — CBC WITH DIFFERENTIAL/PLATELET
Abs Immature Granulocytes: 0.03 K/uL (ref 0.00–0.07)
Basophils Absolute: 0.1 K/uL (ref 0.0–0.1)
Basophils Relative: 1 %
Eosinophils Absolute: 0.2 K/uL (ref 0.0–0.5)
Eosinophils Relative: 2 %
HCT: 35.7 % — ABNORMAL LOW (ref 36.0–46.0)
Hemoglobin: 11.7 g/dL — ABNORMAL LOW (ref 12.0–15.0)
Immature Granulocytes: 0 %
Lymphocytes Relative: 13 %
Lymphs Abs: 1.3 K/uL (ref 0.7–4.0)
MCH: 29 pg (ref 26.0–34.0)
MCHC: 32.8 g/dL (ref 30.0–36.0)
MCV: 88.6 fL (ref 80.0–100.0)
Monocytes Absolute: 0.8 K/uL (ref 0.1–1.0)
Monocytes Relative: 8 %
Neutro Abs: 7.5 K/uL (ref 1.7–7.7)
Neutrophils Relative %: 76 %
Platelets: 314 K/uL (ref 150–400)
RBC: 4.03 MIL/uL (ref 3.87–5.11)
RDW: 16.5 % — ABNORMAL HIGH (ref 11.5–15.5)
WBC: 9.9 K/uL (ref 4.0–10.5)
nRBC: 0 % (ref 0.0–0.2)

## 2024-09-11 LAB — COMPREHENSIVE METABOLIC PANEL WITH GFR
ALT: 19 U/L (ref 0–44)
AST: 18 U/L (ref 15–41)
Albumin: 4.2 g/dL (ref 3.5–5.0)
Alkaline Phosphatase: 90 U/L (ref 38–126)
Anion gap: 9 (ref 5–15)
BUN: 15 mg/dL (ref 8–23)
CO2: 30 mmol/L (ref 22–32)
Calcium: 10.6 mg/dL — ABNORMAL HIGH (ref 8.9–10.3)
Chloride: 96 mmol/L — ABNORMAL LOW (ref 98–111)
Creatinine, Ser: 0.81 mg/dL (ref 0.44–1.00)
GFR, Estimated: >60 mL/min (ref >60)
Glucose, Bld: 107 mg/dL — ABNORMAL HIGH (ref 70–99)
Potassium: 3.6 mmol/L (ref 3.5–5.1)
Sodium: 135 mmol/L (ref 135–145)
Total Bilirubin: 0.8 mg/dL (ref 0.0–1.2)
Total Protein: 7.3 g/dL (ref 6.5–8.1)

## 2024-09-11 LAB — GLUCOSE, CAPILLARY: Glucose-Capillary: 96 mg/dL (ref 70–99)

## 2024-09-11 LAB — SURGICAL PCR SCREEN
MRSA, PCR: NEGATIVE
Staphylococcus aureus: POSITIVE — AB

## 2024-09-11 LAB — HEMOGLOBIN A1C
Hgb A1c MFr Bld: 5.6 % (ref 4.8–5.6)
Mean Plasma Glucose: 114.02 mg/dL

## 2024-09-11 NOTE — Progress Notes (Signed)
 [facility] results routed to Dr. Blanchie Dessert

## 2024-09-11 NOTE — Progress Notes (Signed)
 Established Patient Office Visit  Subjective   Patient ID: Alexis Mcneil, female    DOB: 09-14-1956  Age: 68 y.o. MRN: [id number]  Chief Complaint  Patient presents with   Pernicious Anemia    HPI  Alexis Mcneil is here for a vitamin B 12 injection. Denies muscle cramps, weakness or irregular heart rate.   ROS    Objective:     BP (!) 112/59   Pulse 88   SpO2 97%    Physical Exam   Results for orders placed or performed during the hospital encounter of 09/11/24  Surgical pcr screen   Specimen: Nasal Mucosa; Nasal Swab  Result Value Ref Range   MRSA, PCR NEGATIVE NEGATIVE   Staphylococcus aureus POSITIVE (A) NEGATIVE  CBC WITH DIFFERENTIAL  Result Value Ref Range   WBC 9.9 4.0 - 10.5 K/uL   RBC 4.03 3.87 - 5.11 MIL/uL   Hemoglobin 11.7 (L) 12.0 - 15.0 g/dL   HCT 64.2 (L) 63.9 - 53.9 %   MCV 88.6 80.0 - 100.0 fL   MCH 29.0 26.0 - 34.0 pg   MCHC 32.8 30.0 - 36.0 g/dL   RDW 83.4 (H) 88.4 - 84.4 %   Platelets 314 150 - 400 K/uL   nRBC 0.0 0.0 - 0.2 %   Neutrophils Relative % 76 %   Neutro Abs 7.5 1.7 - 7.7 K/uL   Lymphocytes Relative 13 %   Lymphs Abs 1.3 0.7 - 4.0 K/uL   Monocytes Relative 8 %   Monocytes Absolute 0.8 0.1 - 1.0 K/uL   Eosinophils Relative 2 %   Eosinophils Absolute 0.2 0.0 - 0.5 K/uL   Basophils Relative 1 %   Basophils Absolute 0.1 0.0 - 0.1 K/uL   Immature Granulocytes 0 %   Abs Immature Granulocytes 0.03 0.00 - 0.07 K/uL  Comprehensive metabolic panel  Result Value Ref Range   Sodium 135 135 - 145 mmol/L   Potassium 3.6 3.5 - 5.1 mmol/L   Chloride 96 (L) 98 - 111 mmol/L   CO2 30 22 - 32 mmol/L   Glucose, Bld 107 (H) 70 - 99 mg/dL   BUN 15 8 - 23 mg/dL   Creatinine, Ser 9.18 0.44 - 1.00 mg/dL   Calcium  10.6 (H) 8.9 - 10.3 mg/dL   Total Protein 7.3 6.5 - 8.1 g/dL   Albumin 4.2 3.5 - 5.0 g/dL   AST 18 15 - 41 U/L   ALT 19 0 - 44 U/L   Alkaline Phosphatase 90 38 - 126 U/L   Total Bilirubin 0.8 0.0 - 1.2 mg/dL   GFR, Estimated  >39 >39 mL/min   Anion gap 9 5 - 15  Glucose, capillary  Result Value Ref Range   Glucose-Capillary 96 70 - 99 mg/dL  Type and screen Order type and screen if day of surgery is less than 15 days from draw of preadmission visit or order morning of surgery if day of surgery is greater than 6 days from preadmission visit.  Result Value Ref Range   ABO/RH(D) A POS    Antibody Screen NEG    Sample Expiration 09/25/2024,2359    Extend sample reason      NO TRANSFUSIONS OR PREGNANCY IN THE PAST 3 MONTHS Performed at [facility], [address], [place], [place] [address]       The 10-year ASCVD risk score (Arnett DK, et al., 2019) is: 14.6%    Assessment & Plan:  B12 - Patient tolerated injection  well without complications. Patient advised to schedule next injection 14 days from today.    Problem List Items Addressed This Visit       Unprioritized   B12 deficiency - Primary    Return in about 2 weeks (around 09/25/2024) for B12 injection. SABRA Pear, Jon Mayor, CMA

## 2024-09-12 ENCOUNTER — Ambulatory Visit: Attending: Orthopedic Surgery

## 2024-09-12 DIAGNOSIS — G8929 Other chronic pain: Secondary | ICD-10-CM | POA: Insufficient documentation

## 2024-09-12 DIAGNOSIS — M25562 Pain in left knee: Secondary | ICD-10-CM | POA: Diagnosis not present

## 2024-09-12 DIAGNOSIS — M25561 Pain in right knee: Secondary | ICD-10-CM | POA: Insufficient documentation

## 2024-09-12 DIAGNOSIS — R2689 Other abnormalities of gait and mobility: Secondary | ICD-10-CM | POA: Insufficient documentation

## 2024-09-12 DIAGNOSIS — M6281 Muscle weakness (generalized): Secondary | ICD-10-CM | POA: Diagnosis not present

## 2024-09-12 LAB — LAB REPORT - SCANNED: HM Hepatitis Screen: NEGATIVE

## 2024-09-12 NOTE — Therapy (Cosign Needed)
 OUTPATIENT PHYSICAL THERAPY LOWER EXTREMITY TREATMENT   Patient Name: REBEL LAUGHRIDGE MRN: [id number] DOB:1956-10-10, 68 y.o., female Today's Date: 09/13/2024  END OF SESSION:  PT End of Session - 09/12/24 1454     Visit Number 3    Number of Visits 5    Date for Recertification  09/19/24    Authorization Type Medicare Part A and B    PT Start Time 1450    PT Stop Time 1530    PT Time Calculation (min) 40 min    Activity Tolerance Patient tolerated treatment well    Behavior During Therapy [facility] for tasks assessed/performed           Past Medical History:  Diagnosis Date   Anemia    Arthritis    KNEE,SHOULDERS   Cataract January 2021   Eye appt visit   Diabetes mellitus without complication (HCC)    GERD (gastroesophageal reflux disease)    Heart murmur    Hyperlipidemia    Hypertension    IFG (impaired fasting glucose)    Neuromuscular disorder (HCC)    NEUROPATHY FEET   OSA (obstructive sleep apnea)    CPAP   Sleep apnea    CPAP   Thyroid  disease    Past Surgical History:  Procedure Laterality Date   COLONOSCOPY     endometrial polyp     January 2014   EYE SURGERY  1988   Cyst on eye removed   mouth palate surgery repair  1961   large tear   ROOT CANEL     2 WEEKS AGO   rt knee surgery arthroscopy  2009   torn meniscus/medial   torn ligament shoulder  1975   repaired   Patient Active Problem List   Diagnosis Date Noted   B12 deficiency 08/17/2024   Primary osteoarthritis of right knee 12/04/2021   CKD (chronic kidney disease) stage 3, GFR 30-59 ml/min (HCC) 11/12/2021   Pre-ulcerative corn or callous 05/20/2020   Excessive sweating 05/20/2020   Gout 03/07/2018   Hereditary and idiopathic peripheral neuropathy 09/24/2015   Trochanteric bursitis of both hips 10/08/2014   Lumbar spinal stenosis 09/06/2014   Right knee pain 09/06/2014   Obstructive sleep apnea 09/26/2010   Left rotator cuff tear 03/18/2010   PARESTHESIA, HANDS 03/18/2010    Diabetes mellitus type 2 in obese 02/07/2008   Hypothyroidism 01/06/2007   Hyperlipidemia 01/06/2007   ANEMIA NOS 01/06/2007   HYPERTENSION, BENIGN ESSENTIAL 01/06/2007    PCP: Dorothyann Byars, MD  REFERRING PROVIDER: Toribio Higashi, MD  REFERRING DIAG: ICD-10M17.0 Bilateral primary OA of knee  THERAPY DIAG:  Muscle weakness (generalized)  Other abnormalities of gait and mobility  Chronic pain of right knee  Chronic pain of left knee  Rationale for Evaluation and Treatment: Rehabilitation  ONSET DATE: End of August   SUBJECTIVE:   SUBJECTIVE STATEMENT: Pt reports she is off all meds, as her surgery is a week from now. She feels more pain than usual. She's been doing her HEP, it causes pain but is manageable.    EVAL: Pt reports she has been able to move independently, completing all chores and housework without problems until this past August 2025. She reports she has had 2 falls since then, losing her balance and feeling extremely weak in both legs. She states having a history of injuries in the right knee, and has been scheduled to have Right  TKR on Sep 20, 2024. She is concerned her left knee is weak, but is  hopeful it will get her through the recovery process. She wants to do PT in preparation for her surgery the next 2 weeks. Reports having pseudogout, every joint hurts.  She currently ambulates with SPC.     PERTINENT HISTORY: Diabetes Neuropathy in feet  Paresthesia hands HTN  PAIN:  Are you having pain? Yes: NPRS scale: 2-3 Pain location: Rt knee Pain description: ache, throbbing, sore  Aggravating factors: sleeping, sitting, WB Relieving factors: LAQ, NSAID  PRECAUTIONS: Fall  RED FLAGS: None   WEIGHT BEARING RESTRICTIONS: No  FALLS:  Has patient fallen in last 6 months? No and Yes. 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  DIAGNOSTIC FINDINGS:   IMPRESSION: Right knee 1. Severe tricompartmental degenerative changes, as described above. 2. 2.7 cm loose body in the suprapatellar recess.  IMPRESSION: Left knee 1. Stable marked medial joint space degenerative changes and moderate tricompartmental degenerative changes. 2. Probable posterior ossified loose bodies.     PATIENT SURVEYS:  PSFS: THE PATIENT SPECIFIC FUNCTIONAL SCALE  Place score of 0-10 (0 = unable to perform activity and 10 = able to perform activity at the same level as before injury or problem)  Activity Date: 09/06/24    Sit to stand transfer without UE support  3    2. Tandem stance 30 sec  0    3.     4.      Total Score 1.5      Total Score = Sum of activity scores/number of activities  Minimally Detectable Change: 3 points (for single activity); 2 points (for average score)  [facility] (nd). The Patient Specific Functional Scale . Retrieved from [email or web address]   COGNITION: Overall cognitive status: Within functional limits for tasks assessed     SENSATION: Not tested    MUSCLE LENGTH: Bil hamstring tightness  POSTURE: rounded shoulders, forward head, and flexed trunk     LOWER EXTREMITY ROM:  Active ROM Right eval Left eval 09/08/24 Right   Hip flexion     Hip extension     Hip abduction     Hip adduction     Hip internal rotation     Hip external rotation     Knee flexion 95 112 deg  96  Knee extension Lacking 11 deg  WFL   Ankle  dorsiflexion     Ankle plantarflexion     Ankle inversion     Ankle eversion      (Blank rows = not tested)  LOWER EXTREMITY MMT:  MMT Right eval Left eval  Hip flexion 3+ 3+  Hip extension 4- 4-  Hip abduction 3+ pain 4+  Hip adduction    Hip internal rotation 4 4  Hip external rotation 4 4  Knee flexion 3 3+  Knee extension 5 4+  Ankle dorsiflexion 5 4  Ankle plantarflexion 5 4  Ankle inversion 4- 3  Ankle eversion 4- 4-   (Blank rows = not tested)    FUNCTIONAL TESTS:  5 times sit to stand: 19.92 sec  Timed up and go (TUG): 21.04 sec  Tandem stance 30 sec both sides- right leg behind required UE support   GAIT: Distance walked: 75 ft  Assistive device utilized: Single point cane Level of assistance: Modified independence Comments: Pt depends on her SPC to maintain balance while walking and tends to lean to left side during gait    [facility] Adult PT Treatment:                                                DATE: 09/12/24 Therapeutic Exercise: Seated clamshells with green band 2x10  Seated knee extension AAROM 5 sec hold x 10 Hamstring stretch 2x30 sec each leg  Calf raises in seated x20 Updated HEP Neuromuscular re-ed: Shoulder AROM retraction x10 for postural strengthening during LE exercises  Supine knee flexion with stability ball heel press 2x8 SLR 2x8 right leg only                                                                                                                                [facility] Adult PT Treatment:                                                DATE: 09/08/24 Therapeutic Exercise: Seated knee extension AAROM x 10  Heel slides with strap x 10   Neuromuscular re-ed: SAQ 2 x 10  Seated hip abduction red band x 5 Seated march red band x 5   Self Care: Discussed typical rehab post-op progression, post-op expectations and AD that would likely be helpful Discussed community gym options that she could attend at appropriate timeline  post-operatively    [facility] Adult PT Treatment:                                                DATE: 09/06/24 Therapeutic Exercise: See HEP Review HEP   PATIENT EDUCATION:  Education details: HEP update Person educated: Patient Education method: Explanation, Demonstration, Tactile cues, Verbal cues, and Handouts Education comprehension: verbalized understanding, returned demonstration, verbal cues required, tactile cues required, and needs further education  HOME EXERCISE PROGRAM: Access Code: [email or web address] URL: [email or web address] Date: 09/12/2024 Prepared by: Lavanda Cleverly  Exercises - Seated Long Arc Quad  - 1 x daily - 7 x weekly - 3 sets - 10 reps - Seated Ankle Pumps on Table  - 1 x daily - 7 x weekly - 3 sets - 10 reps - Supine Heel Slide with Strap  - 2 x daily -  7 x weekly - 1 sets - 10 reps - Seated Knee Extension AAROM  - 1 x daily - 7 x weekly - 1 sets - 10 reps - Supine Short Arc Quad  - 1 x daily - 7 x weekly - 2 sets - 10 reps - Seated Hip Abduction with Resistance  - 1 x daily - 7 x weekly - 2 sets - 10 reps - Seated March with Resistance  - 1 x daily - 7 x weekly - 2 sets - 10 reps - Supine Hip and Knee Flexion AROM with Swiss Ball  - 1 x daily - 7 x weekly - 3 sets - 10 reps - Seated Hamstring Stretch  - 1 x daily - 7 x weekly - 3 sets - 10 reps - Seated Heel Raise  - 1 x daily - 7 x weekly - 3 sets - 10 reps - Supine Straight Leg Raises  - 1 x daily - 7 x weekly - 3 sets - 10 reps  ASSESSMENT:  CLINICAL IMPRESSION: Patient tolerated session well focusing on improving Rt knee ROM and strength as she prepares for upcoming TKA. Required extra time for body position transitioning between reps, however completed all reps and sets instructed. Progressed clamshells with increased resistance she tolerated well. Updated HEP and gave pt print out.   EVAL: Patient is a 68 y.o. female who was seen today for physical therapy evaluation and treatment for prehab of  bilateral knee OA with planned Rt TKA on 09/20/24. Pt reports having an ongoing history of injuries in her right knee and is finally having surgery to help improve functional capacity. She reports having a downward trajectory of events this past August 2025, stating she had 2 falls from loss of balance. She is concerned her left knee is unstable and weak, thus would like to strengthen it before surgery with PT exercises. She states having neuropathy in both feet create challenges for completing everyday activities. Upon assessment pt presents with significant LE weakness RT>Lt and decreased right knee flexion and extension mobility. She will benefit from skilled PT to address the above stated deficits in order to better prepare for upcoming surgery and allow for optimal outcome.   OBJECTIVE IMPAIRMENTS: Abnormal gait, decreased activity tolerance, decreased balance, decreased coordination, decreased endurance, decreased mobility, difficulty walking, decreased ROM, decreased strength, increased fascial restrictions, impaired flexibility, improper body mechanics, postural dysfunction, and pain.   ACTIVITY LIMITATIONS: carrying, lifting, bending, sitting, standing, squatting, sleeping, stairs, and transfers  PARTICIPATION LIMITATIONS: meal prep, cleaning, laundry, medication management, driving, community activity, and occupation  PERSONAL FACTORS: Age, Fitness, Past/current experiences, Profession, Time since onset of injury/illness/exacerbation, and 3+ comorbidities: Bil feet neuropathy  are also affecting patient's functional outcome.   REHAB POTENTIAL: Good  CLINICAL DECISION MAKING: Evolving/moderate complexity  EVALUATION COMPLEXITY: Moderate   GOALS: Goals reviewed with patient? Yes  SHORT /LONG TERM GOALS: Target date: 09/19/24 Pt will be efficient in pre surgery HEP so she has optimizing post surgery recovery and decrease risk for injury Baseline: Goal status: MET 09/12/24  2.  Pt will  perform TUG in 15 sec or less to reduce fall risk.  Baseline: 19 sec  Goal status: INITIAL  3.  Pt will perform 5 sit to stands in 15 sec or less so she increases strength for walking Baseline: 21 sec  Goal status: INITIAL  4.  Pt will score 2 points higher on her PSPF score so she is a decreased fall risk  Baseline: see above  Goal status: INITIAL  5.  Pt will lack 6 degrees or less in right knee extension so she ambulates with safe body mechanics Baseline: lacking 11 deg  Goal status: INITIAL       PLAN:  PT FREQUENCY: 1-2x/week  PT DURATION: 2 weeks  PLANNED INTERVENTIONS: 97750- Physical Performance Testing, 97110-Therapeutic exercises, 97530- Therapeutic activity, [id number]- Neuromuscular re-education, 97535- Self Care, [id number]- Manual therapy, [id number]- Gait training, [phone]- Orthotic Initial, [phone]- Aquatic Therapy, [phone]- Electrical stimulation (manual), [phone] (1-2 muscles), [phone] (3+ muscles)- Dry Needling, Patient/Family education, Balance training, Stair training, Joint mobilization, Scar mobilization, DME instructions, Cryotherapy, and Moist heat  PLAN FOR NEXT SESSION: LAQ, hamstring stretch, quad set, ankle pumps; generalized LE strengthening and static balance activity to prepare for upcoming surgery. Stair negotiation for surgical prep. Anticipate d/c   Samantha P[place]a, PT, DPT, ATC 09/13/24 7:47 AM Lavanda Cleverly, SPT 09/13/24 7:47 AM

## 2024-09-14 ENCOUNTER — Ambulatory Visit

## 2024-09-14 DIAGNOSIS — G8929 Other chronic pain: Secondary | ICD-10-CM

## 2024-09-14 DIAGNOSIS — M6281 Muscle weakness (generalized): Secondary | ICD-10-CM

## 2024-09-14 DIAGNOSIS — M25562 Pain in left knee: Secondary | ICD-10-CM | POA: Diagnosis not present

## 2024-09-14 DIAGNOSIS — M25561 Pain in right knee: Secondary | ICD-10-CM | POA: Diagnosis not present

## 2024-09-14 DIAGNOSIS — R2689 Other abnormalities of gait and mobility: Secondary | ICD-10-CM | POA: Diagnosis not present

## 2024-09-14 NOTE — Therapy (Addendum)
 OUTPATIENT PHYSICAL THERAPY LOWER EXTREMITY TREATMENT PHYSICAL THERAPY DISCHARGE SUMMARY  Visits from Start of Care: 4  Current functional level related to goals / functional outcomes: See below   Remaining deficits: See below   Education / Equipment: See below   Patient agrees to discharge. Patient goals were partially met. Patient is being discharged due to scheduled for TKA next week.   Patient Name: Alexis Mcneil MRN: [id number] DOB:05-Nov-1956, 68 y.o., female Today's Date: 09/14/2024  END OF SESSION:  PT End of Session - 09/14/24 1359     Visit Number 4    Number of Visits 5    Date for Recertification  09/19/24    Authorization Type Medicare Part A and B    PT Start Time 1403    PT Stop Time 1445    PT Time Calculation (min) 42 min    Activity Tolerance Patient tolerated treatment well    Behavior During Therapy [facility] for tasks assessed/performed           Past Medical History:  Diagnosis Date   Anemia    Arthritis    KNEE,SHOULDERS   Cataract January 2021   Eye appt visit   Diabetes mellitus without complication (HCC)    GERD (gastroesophageal reflux disease)    Heart murmur    Hyperlipidemia    Hypertension    IFG (impaired fasting glucose)    Neuromuscular disorder (HCC)    NEUROPATHY FEET   OSA (obstructive sleep apnea)    CPAP   Sleep apnea    CPAP   Thyroid  disease    Past Surgical History:  Procedure Laterality Date   COLONOSCOPY     endometrial polyp     January 2014   EYE SURGERY  1988   Cyst on eye removed   mouth palate surgery repair  1961   large tear   ROOT CANEL     2 WEEKS AGO   rt knee surgery arthroscopy  2009   torn meniscus/medial   torn ligament shoulder  1975   repaired   Patient Active Problem List   Diagnosis Date Noted   B12 deficiency 08/17/2024   Primary osteoarthritis of right knee 12/04/2021   CKD (chronic kidney disease) stage 3, GFR 30-59 ml/min (HCC) 11/12/2021   Pre-ulcerative corn or callous  05/20/2020   Excessive sweating 05/20/2020   Gout 03/07/2018   Hereditary and idiopathic peripheral neuropathy 09/24/2015   Trochanteric bursitis of both hips 10/08/2014   Lumbar spinal stenosis 09/06/2014   Right knee pain 09/06/2014   Obstructive sleep apnea 09/26/2010   Left rotator cuff tear 03/18/2010   PARESTHESIA, HANDS 03/18/2010   Diabetes mellitus type 2 in obese 02/07/2008   Hypothyroidism 01/06/2007   Hyperlipidemia 01/06/2007   ANEMIA NOS 01/06/2007   HYPERTENSION, BENIGN ESSENTIAL 01/06/2007    PCP: Dorothyann Byars, MD  REFERRING PROVIDER: Toribio Higashi, MD  REFERRING DIAG: ICD-10M17.0 Bilateral primary OA of knee  THERAPY DIAG:  Muscle weakness (generalized)  Other abnormalities of gait and mobility  Chronic pain of right knee  Rationale for Evaluation and Treatment: Rehabilitation  ONSET DATE: End of August   SUBJECTIVE:   SUBJECTIVE STATEMENT: Pt reports she is off all meds, as her surgery is a week from now. She's been doing her HEP, it causes pain but is manageable.  She presents with both RW and SPC for PT modifications before surgery.   EVAL: Pt reports she has been able to move independently, completing all chores and housework without problems  until this past August 2025. She reports she has had 2 falls since then, losing her balance and feeling extremely weak in both legs. She states having a history of injuries in the right knee, and has been scheduled to have Right  TKR on Sep 20, 2024. She is concerned her left knee is weak, but is hopeful it will get her through the recovery process. She wants to do PT in preparation for her surgery the next 2 weeks. Reports having pseudogout, every joint hurts.  She currently ambulates with SPC.     PERTINENT HISTORY: Diabetes Neuropathy in feet  Paresthesia hands HTN  PAIN:  Are you having pain? Yes: NPRS scale: 5/10 Pain location: Rt knee Pain description: ache, throbbing, sore  Aggravating  factors: sleeping, sitting, WB Relieving factors: LAQ, NSAID  PRECAUTIONS: Fall  RED FLAGS: None   WEIGHT BEARING RESTRICTIONS: No  FALLS:  Has patient fallen in last 6 months? No and Yes. 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  DIAGNOSTIC FINDINGS:   IMPRESSION: Right knee 1. Severe tricompartmental degenerative changes, as described above. 2. 2.7 cm loose body in the suprapatellar recess.  IMPRESSION: Left knee 1. Stable marked medial joint space degenerative changes and moderate tricompartmental degenerative changes. 2. Probable posterior ossified loose bodies.     PATIENT SURVEYS:  PSFS: THE PATIENT SPECIFIC FUNCTIONAL SCALE  Place score of 0-10 (0 = unable to perform activity and 10 = able to perform activity at the same level as before injury or problem)  Activity Date: 09/06/24 09/14/24   Sit to stand transfer without UE support  3 4   2. Tandem stance 30 sec  0 3   3.     4.      Total Score 1.5 3.5     Total Score = Sum of activity scores/number of activities  Minimally Detectable Change: 3 points (for single activity); 2 points (for average score)  [facility] (nd). The Patient Specific Functional Scale . Retrieved from [email or web address]   COGNITION: Overall cognitive status: Within functional limits for tasks assessed     SENSATION: Not tested    MUSCLE  LENGTH: Bil hamstring tightness  POSTURE: rounded shoulders, forward head, and flexed trunk     LOWER EXTREMITY ROM:  Active ROM Right eval Left eval 09/08/24 Right  09/14/24 Right AROM  Hip flexion      Hip extension      Hip abduction      Hip adduction      Hip internal rotation      Hip external rotation      Knee flexion 95 112 deg  96   Knee extension Lacking 11 deg  WFL  0 in long sit   Ankle dorsiflexion      Ankle plantarflexion      Ankle inversion      Ankle eversion       (Blank rows = not tested)  LOWER EXTREMITY MMT:  MMT Right eval Left  eval  Hip flexion 3+ 3+  Hip extension 4- 4-  Hip abduction 3+ pain 4+  Hip adduction    Hip internal rotation 4 4  Hip external rotation 4 4  Knee flexion 3 3+  Knee extension 5 4+  Ankle dorsiflexion 5 4  Ankle plantarflexion 5 4  Ankle inversion 4- 3  Ankle eversion 4- 4-   (Blank rows = not tested)    FUNCTIONAL TESTS:  5 times sit to stand: 19.92 sec  Timed up and go (TUG): 21.04 sec  Tandem stance 30 sec both sides- right leg behind required UE support   GAIT: Distance walked: 75 ft  Assistive device utilized: Single point cane Level of assistance: Modified independence Comments: Pt depends on her SPC to maintain balance while walking and tends to lean to left side during gait  [facility] Adult PT Treatment:                                                DATE: 09/14/24  Therapeutic Exercise: LAQ 2x10  Lumbar rotations x10 stretch  Side lying leg lifts 2x10  Review and update HEP   Therapeutic Activity: TKE ball against wall 2x10  Re-assessment of goals to determine overall progress, educating patient on progress towards goals   Self Care: Discussed what to expect after surgery next week Reassessed/checked pt assistive devices including SPC and RW Discussed post-operative stair negotiation pattern (step to)   [facility] Adult PT Treatment:                                                DATE:  09/12/24 Therapeutic Exercise: Seated clamshells with green band 2x10  Seated knee extension AAROM 5 sec hold x 10 Hamstring stretch 2x30 sec each leg  Calf raises in seated x20 Updated HEP Neuromuscular re-ed: Shoulder AROM retraction x10 for postural strengthening during LE exercises  Supine knee flexion with stability ball heel press 2x8 SLR 2x8 right leg only                                                                                                                                [facility] Adult PT Treatment:                                                DATE: 09/08/24 Therapeutic Exercise: Seated knee extension AAROM x 10  Heel slides with strap x 10   Neuromuscular re-ed: SAQ 2 x 10  Seated hip abduction red band x 5 Seated march red band x  5   Self Care: Discussed typical rehab post-op progression, post-op expectations and AD that would likely be helpful Discussed community gym options that she could attend at appropriate timeline post-operatively    [facility] Adult PT Treatment:                                                DATE: 09/06/24 Therapeutic Exercise: See HEP Review HEP   PATIENT EDUCATION:  Education details: see treatment  Person educated: Patient Education method: Explanation, Demonstration, Tactile cues, Verbal cues, and Handouts Education comprehension: verbalized understanding and returned demonstration  HOME EXERCISE PROGRAM: Access Code: [email or web address] URL: [email or web address] Date: 09/14/2024 Prepared by: Lavanda Cleverly  Exercises - Seated Long Arc Quad  - 1 x daily - 7 x weekly - 3 sets - 10 reps - Seated Ankle Pumps on Table  - 1 x daily - 7 x weekly - 3 sets - 10 reps - Supine Heel Slide with Strap  - 2 x daily - 7 x weekly - 1 sets - 10 reps - Seated Knee Extension AAROM  - 1 x daily - 7 x weekly - 1 sets - 10 reps - Supine Short Arc Quad  - 1 x daily - 7 x weekly - 2 sets - 10 reps - Seated Hip Abduction with Resistance  - 1 x  daily - 7 x weekly - 2 sets - 10 reps - Seated March with Resistance  - 1 x daily - 7 x weekly - 2 sets - 10 reps - Supine Hip and Knee Flexion AROM with Swiss Ball  - 1 x daily - 7 x weekly - 3 sets - 10 reps - Seated Hamstring Stretch  - 1 x daily - 7 x weekly - 3 sets - 10 reps - Seated Heel Raise  - 1 x daily - 7 x weekly - 3 sets - 10 reps - Supine Straight Leg Raises  - 1 x daily - 7 x weekly - 3 sets - 10 reps - Sidelying Hip Abduction  - 1 x daily - 7 x weekly - 3 sets - 10 reps - Standing Terminal Knee Extension at Wall with Ball  - 1 x daily - 7 x weekly - 3 sets - 10 reps  ASSESSMENT:  CLINICAL IMPRESSION: Patient tolerated session well focusing on improving Rt knee ROM and strength as she prepares for upcoming TKA. Required extra time for body position transitioning between reps during side lying hip abduction, however completed all reps and sets instructed. Reassessed pt goal status- meeting majority of established functional goals. Checked RW and SPC and altered to appropriate height for upcoming surgery. Updated HEP and gave pt print out. Pt is prepped for undergoing knee surgery next week, and is appropriate for discharge at this time with patient in agreement with this plan.   EVAL: Patient is a 68 y.o. female who was seen today for physical therapy evaluation and treatment for prehab of bilateral knee OA with planned Rt TKA on 09/20/24. Pt reports having an ongoing history of injuries in her right knee and is finally having surgery to help improve functional capacity. She reports having a downward trajectory of events this past August 2025, stating she had 2 falls from loss of balance. She is concerned her left knee is unstable and weak, thus would like to strengthen it before surgery with  PT exercises. She states having neuropathy in both feet create challenges for completing everyday activities. Upon assessment pt presents with significant LE weakness RT>Lt and decreased right knee  flexion and extension mobility. She will benefit from skilled PT to address the above stated deficits in order to better prepare for upcoming surgery and allow for optimal outcome.   OBJECTIVE IMPAIRMENTS: Abnormal gait, decreased activity tolerance, decreased balance, decreased coordination, decreased endurance, decreased mobility, difficulty walking, decreased ROM, decreased strength, increased fascial restrictions, impaired flexibility, improper body mechanics, postural dysfunction, and pain.   ACTIVITY LIMITATIONS: carrying, lifting, bending, sitting, standing, squatting, sleeping, stairs, and transfers  PARTICIPATION LIMITATIONS: meal prep, cleaning, laundry, medication management, driving, community activity, and occupation  PERSONAL FACTORS: Age, Fitness, Past/current experiences, Profession, Time since onset of injury/illness/exacerbation, and 3+ comorbidities: Bil feet neuropathy  are also affecting patient's functional outcome.   REHAB POTENTIAL: Good  CLINICAL DECISION MAKING: Evolving/moderate complexity  EVALUATION COMPLEXITY: Moderate   GOALS: Goals reviewed with patient? Yes  SHORT /LONG TERM GOALS: Target date: 09/19/24 Pt will be efficient in pre surgery HEP so she has optimizing post surgery recovery and decrease risk for injury Baseline: Goal status: MET 09/12/24  2.  Pt will perform TUG in 15 sec or less to reduce fall risk.  Baseline: 19 sec  Goal status: MET on 09/14/24 12.30 sec  3.  Pt will perform 5 sit to stands in 15 sec or less so she increases strength for walking Baseline: 21 sec  Goal status: Partially met 09/14/24 18.71 sec  4.  Pt will score 2 points higher on her PSPF score so she is a decreased fall risk  Baseline: 1.5 Goal status: met 3.5 on 09/14/24  5.  Pt will lack 6 degrees or less in right knee extension so she ambulates with safe body mechanics Baseline: lacking 11 deg  Goal status: MET on 09/14/24 0 deg        PLAN:  PT FREQUENCY:  1-2x/week  PT DURATION: 2 weeks  PLANNED INTERVENTIONS: 97750- Physical Performance Testing, 97110-Therapeutic exercises, 97530- Therapeutic activity, 97112- Neuromuscular re-education, 97535- Self Care, 02859- Manual therapy, [phone]- Gait training, [phone]- Orthotic Initial, [phone]- Aquatic Therapy, [phone]- Electrical stimulation (manual), [phone] (1-2 muscles), [phone] (3+ muscles)- Dry Needling, Patient/Family education, Balance training, Stair training, Joint mobilization, Scar mobilization, DME instructions, Cryotherapy, and Moist heat    Lucie Meeter, PT, DPT, ATC 09/14/24 3:40 PM Lavanda Cleverly, SPT 09/14/24 3:40 PM

## 2024-09-15 ENCOUNTER — Ambulatory Visit

## 2024-09-17 ENCOUNTER — Other Ambulatory Visit: Payer: Self-pay | Admitting: Family Medicine

## 2024-09-17 DIAGNOSIS — G8929 Other chronic pain: Secondary | ICD-10-CM

## 2024-09-19 ENCOUNTER — Other Ambulatory Visit: Payer: Self-pay | Admitting: Family Medicine

## 2024-09-19 ENCOUNTER — Ambulatory Visit: Payer: Self-pay | Admitting: Family Medicine

## 2024-09-19 DIAGNOSIS — E038 Other specified hypothyroidism: Secondary | ICD-10-CM

## 2024-09-19 NOTE — Anesthesia Preprocedure Evaluation (Signed)
 Anesthesia Evaluation  Patient identified by MRN, date of birth, ID band Patient awake    Reviewed: Allergy  & Precautions, NPO status , Patient's Chart, lab work & pertinent test results  Airway Mallampati: II  TM Distance: >3 FB Neck ROM: Full    Dental no notable dental hx. (+) Dental Advisory Given   Pulmonary sleep apnea and Continuous Positive Airway Pressure Ventilation , neg recent URI   Pulmonary exam normal breath sounds clear to auscultation       Cardiovascular hypertension, Pt. on medications Normal cardiovascular exam+ Valvular Problems/Murmurs  Rhythm:Regular Rate:Normal     Neuro/Psych negative neurological ROS     GI/Hepatic Neg liver ROS,GERD  Medicated and Controlled,,  Endo/Other  diabetesHypothyroidism    Renal/GU Renal disease     Musculoskeletal  (+) Arthritis ,    Abdominal   Peds  Hematology  (+) Blood dyscrasia, anemia   Anesthesia Other Findings   Reproductive/Obstetrics                              Anesthesia Physical Anesthesia Plan  ASA: 3  Anesthesia Plan: Spinal   Post-op Pain Management: Regional block* and Tylenol  PO (pre-op)*   Induction:   PONV Risk Score and Plan: 3 and Ondansetron , Dexamethasone , Treatment may vary due to age or medical condition, Propofol  infusion and TIVA  Airway Management Planned:   Additional Equipment: None  Intra-op Plan:   Post-operative Plan:   Informed Consent: I have reviewed the patients History and Physical, chart, labs and discussed the procedure including the risks, benefits and alternatives for the proposed anesthesia with the patient or authorized representative who has indicated his/her understanding and acceptance.     Dental advisory given  Plan Discussed with: CRNA  Anesthesia Plan Comments:          Anesthesia Quick Evaluation

## 2024-09-19 NOTE — Progress Notes (Signed)
 We got her sleep study back .  Did she wear her CPAP that night or not?  Just want to clarify?

## 2024-09-20 ENCOUNTER — Observation Stay (HOSPITAL_COMMUNITY)
Admission: RE | Admit: 2024-09-20 | Discharge: 2024-09-21 | Disposition: A | Discharge status: Home / Self Care | Attending: Orthopedic Surgery | Admitting: Orthopedic Surgery

## 2024-09-20 ENCOUNTER — Ambulatory Visit (HOSPITAL_COMMUNITY): Payer: Self-pay | Admitting: Anesthesiology

## 2024-09-20 ENCOUNTER — Ambulatory Visit (HOSPITAL_COMMUNITY): Payer: Self-pay | Admitting: Physician Assistant

## 2024-09-20 ENCOUNTER — Other Ambulatory Visit: Payer: Self-pay

## 2024-09-20 ENCOUNTER — Ambulatory Visit (HOSPITAL_COMMUNITY)

## 2024-09-20 ENCOUNTER — Encounter (HOSPITAL_COMMUNITY)
Admission: RE | Disposition: A | Discharge status: Home / Self Care | Payer: Self-pay | Source: Home / Self Care | Attending: Orthopedic Surgery

## 2024-09-20 ENCOUNTER — Encounter (HOSPITAL_COMMUNITY): Payer: Self-pay | Admitting: Orthopedic Surgery

## 2024-09-20 DIAGNOSIS — I129 Hypertensive chronic kidney disease with stage 1 through stage 4 chronic kidney disease, or unspecified chronic kidney disease: Secondary | ICD-10-CM | POA: Diagnosis not present

## 2024-09-20 DIAGNOSIS — Z7982 Long term (current) use of aspirin: Secondary | ICD-10-CM | POA: Diagnosis not present

## 2024-09-20 DIAGNOSIS — M1711 Unilateral primary osteoarthritis, right knee: Secondary | ICD-10-CM

## 2024-09-20 DIAGNOSIS — M25561 Pain in right knee: Secondary | ICD-10-CM | POA: Diagnosis present

## 2024-09-20 DIAGNOSIS — E039 Hypothyroidism, unspecified: Secondary | ICD-10-CM | POA: Insufficient documentation

## 2024-09-20 DIAGNOSIS — E1122 Type 2 diabetes mellitus with diabetic chronic kidney disease: Secondary | ICD-10-CM | POA: Insufficient documentation

## 2024-09-20 DIAGNOSIS — E119 Type 2 diabetes mellitus without complications: Secondary | ICD-10-CM

## 2024-09-20 DIAGNOSIS — Z471 Aftercare following joint replacement surgery: Secondary | ICD-10-CM | POA: Diagnosis not present

## 2024-09-20 DIAGNOSIS — N183 Chronic kidney disease, stage 3 unspecified: Secondary | ICD-10-CM

## 2024-09-20 DIAGNOSIS — G4733 Obstructive sleep apnea (adult) (pediatric): Secondary | ICD-10-CM | POA: Diagnosis not present

## 2024-09-20 DIAGNOSIS — Z79899 Other long term (current) drug therapy: Secondary | ICD-10-CM | POA: Diagnosis not present

## 2024-09-20 DIAGNOSIS — G8918 Other acute postprocedural pain: Secondary | ICD-10-CM | POA: Diagnosis not present

## 2024-09-20 DIAGNOSIS — Z96651 Presence of right artificial knee joint: Secondary | ICD-10-CM | POA: Diagnosis not present

## 2024-09-20 HISTORY — PX: TOTAL KNEE ARTHROPLASTY: SHX125

## 2024-09-20 LAB — GLUCOSE, CAPILLARY
Glucose-Capillary: 132 mg/dL — ABNORMAL HIGH (ref 70–99)
Glucose-Capillary: 160 mg/dL — ABNORMAL HIGH (ref 70–99)

## 2024-09-20 LAB — TYPE AND SCREEN
ABO/RH(D): A POS
Antibody Screen: NEGATIVE

## 2024-09-20 LAB — ABO/RH: ABO/RH(D): A POS

## 2024-09-20 SURGERY — ARTHROPLASTY, KNEE, TOTAL
Anesthesia: Spinal | Site: Knee | Laterality: Right

## 2024-09-20 MED ORDER — METHOCARBAMOL 500 MG PO TABS
500.0000 mg | ORAL_TABLET | Freq: Four times a day (QID) | ORAL | Status: DC | PRN
Start: 2024-09-20 — End: 2024-09-21
  Administered 2024-09-20 (×2): 500 mg via ORAL
  Filled 2024-09-20: qty 1

## 2024-09-20 MED ORDER — LIDOCAINE HCL (CARDIAC) PF 100 MG/5ML IV SOSY
PREFILLED_SYRINGE | INTRAVENOUS | Status: DC | PRN
  Administered 2024-09-20: 60 mg via INTRAVENOUS
  Administered 2024-09-20: 40 mg via INTRAVENOUS

## 2024-09-20 MED ORDER — ONDANSETRON HCL 4 MG/2ML IJ SOLN
INTRAMUSCULAR | Status: AC
  Filled 2024-09-20: qty 2

## 2024-09-20 MED ORDER — PANTOPRAZOLE SODIUM 40 MG PO TBEC
40.0000 mg | DELAYED_RELEASE_TABLET | Freq: Every day | ORAL | Status: DC
  Administered 2024-09-20 – 2024-09-21 (×2): 40 mg via ORAL
  Filled 2024-09-20 (×2): qty 1

## 2024-09-20 MED ORDER — ZOLPIDEM TARTRATE 5 MG PO TABS
5.0000 mg | ORAL_TABLET | Freq: Every evening | ORAL | Status: DC | PRN
Start: 2024-09-20 — End: 2024-09-21

## 2024-09-20 MED ORDER — PHENOL 1.4 % MT LIQD: 1.0000 | OROMUCOSAL | Status: DC | PRN

## 2024-09-20 MED ORDER — ASPIRIN 81 MG PO CHEW
81.0000 mg | CHEWABLE_TABLET | Freq: Two times a day (BID) | ORAL | Status: DC
  Administered 2024-09-20 – 2024-09-21 (×2): 81 mg via ORAL
  Filled 2024-09-20 (×3): qty 1

## 2024-09-20 MED ORDER — METHOCARBAMOL 500 MG PO TABS: 500.0000 mg | ORAL_TABLET | Freq: Three times a day (TID) | ORAL | 0 refills | Status: AC | PRN

## 2024-09-20 MED ORDER — BUPIVACAINE LIPOSOME 1.3 % IJ SUSP
INTRAMUSCULAR | Status: AC
  Filled 2024-09-20: qty 20

## 2024-09-20 MED ORDER — OXYCODONE HCL 5 MG PO TABS: 5.0000 mg | ORAL_TABLET | ORAL | 0 refills | Status: AC | PRN

## 2024-09-20 MED ORDER — POLYETHYLENE GLYCOL 3350 17 G PO PACK: 17.0000 g | PACK | Freq: Every day | ORAL | Status: DC | PRN

## 2024-09-20 MED ORDER — LACTATED RINGERS IV BOLUS
500.0000 mL | Freq: Once | INTRAVENOUS | Status: AC
  Administered 2024-09-20: 500 mL via INTRAVENOUS

## 2024-09-20 MED ORDER — CHLORHEXIDINE GLUCONATE 0.12 % MT SOLN
15.0000 mL | Freq: Once | OROMUCOSAL | Status: AC
  Administered 2024-09-20: 15 mL via OROMUCOSAL

## 2024-09-20 MED ORDER — ATORVASTATIN CALCIUM 40 MG PO TABS: 40.0000 mg | ORAL_TABLET | Freq: Every evening | ORAL | Status: DC

## 2024-09-20 MED ORDER — METHOCARBAMOL 500 MG PO TABS
ORAL_TABLET | ORAL | Status: AC
  Filled 2024-09-20: qty 1

## 2024-09-20 MED ORDER — DEXAMETHASONE SOD PHOSPHATE PF 10 MG/ML IJ SOLN
INTRAMUSCULAR | Status: DC | PRN
  Administered 2024-09-20: 10 mg via INTRAVENOUS

## 2024-09-20 MED ORDER — PROPOFOL 10 MG/ML IV BOLUS
INTRAVENOUS | Status: DC | PRN
  Administered 2024-09-20: 20 mg via INTRAVENOUS
  Administered 2024-09-20: 30 mg via INTRAVENOUS
  Administered 2024-09-20 (×2): 20 mg via INTRAVENOUS

## 2024-09-20 MED ORDER — DEXAMETHASONE SODIUM PHOSPHATE 4 MG/ML IJ SOLN
INTRAMUSCULAR | Status: DC | PRN
  Administered 2024-09-20: 5 mg via PERINEURAL

## 2024-09-20 MED ORDER — BUPIVACAINE-EPINEPHRINE (PF) 0.25% -1:200000 IJ SOLN
INTRAMUSCULAR | Status: AC
  Filled 2024-09-20: qty 30

## 2024-09-20 MED ORDER — METHOCARBAMOL 1000 MG/10ML IJ SOLN: 500.0000 mg | Freq: Four times a day (QID) | INTRAMUSCULAR | Status: DC | PRN

## 2024-09-20 MED ORDER — ASPIRIN 81 MG PO TBEC: 81.0000 mg | DELAYED_RELEASE_TABLET | Freq: Two times a day (BID) | ORAL | Status: AC

## 2024-09-20 MED ORDER — ONDANSETRON HCL 4 MG PO TABS: 4.0000 mg | ORAL_TABLET | Freq: Four times a day (QID) | ORAL | Status: DC | PRN

## 2024-09-20 MED ORDER — MENTHOL 3 MG MT LOZG: 1.0000 | LOZENGE | OROMUCOSAL | Status: DC | PRN

## 2024-09-20 MED ORDER — ALLOPURINOL 300 MG PO TABS: 150.0000 mg | ORAL_TABLET | Freq: Every evening | ORAL | Status: DC

## 2024-09-20 MED ORDER — ACETAMINOPHEN 500 MG PO TABS
1000.0000 mg | ORAL_TABLET | Freq: Once | ORAL | Status: DC
  Filled 2024-09-20: qty 2

## 2024-09-20 MED ORDER — PROPOFOL 1000 MG/100ML IV EMUL
INTRAVENOUS | Status: AC
  Filled 2024-09-20: qty 100

## 2024-09-20 MED ORDER — ACETAMINOPHEN 500 MG PO TABS
1000.0000 mg | ORAL_TABLET | Freq: Four times a day (QID) | ORAL | Status: DC
  Administered 2024-09-21: 1000 mg via ORAL
  Filled 2024-09-20: qty 2

## 2024-09-20 MED ORDER — LACTATED RINGERS IV SOLN: INTRAVENOUS | Status: DC

## 2024-09-20 MED ORDER — OXYCODONE HCL 5 MG PO TABS
5.0000 mg | ORAL_TABLET | ORAL | Status: DC | PRN
  Administered 2024-09-20 – 2024-09-21 (×3): 10 mg via ORAL
  Filled 2024-09-20 (×2): qty 2

## 2024-09-20 MED ORDER — ACETAMINOPHEN 500 MG PO TABS: 1000.0000 mg | ORAL_TABLET | Freq: Three times a day (TID) | ORAL | Status: AC | PRN

## 2024-09-20 MED ORDER — SODIUM CHLORIDE 0.9 % IR SOLN: Status: DC | PRN

## 2024-09-20 MED ORDER — POTASSIUM CHLORIDE CRYS ER 20 MEQ PO TBCR
20.0000 meq | EXTENDED_RELEASE_TABLET | Freq: Two times a day (BID) | ORAL | Status: DC
  Administered 2024-09-21: 20 meq via ORAL
  Filled 2024-09-20: qty 1

## 2024-09-20 MED ORDER — 0.9 % SODIUM CHLORIDE (POUR BTL) OPTIME
TOPICAL | Status: DC | PRN
Start: 2024-09-20 — End: 2024-09-20
  Administered 2024-09-20: 1000 mL

## 2024-09-20 MED ORDER — POLYETHYLENE GLYCOL 3350 17 G PO PACK: 17.0000 g | PACK | Freq: Every day | ORAL | 0 refills | Status: AC

## 2024-09-20 MED ORDER — TRANEXAMIC ACID-NACL 1000-0.7 MG/100ML-% IV SOLN
INTRAVENOUS | Status: AC
Start: 2024-09-20 — End: 2024-09-20
  Filled 2024-09-20: qty 100

## 2024-09-20 MED ORDER — DEXAMETHASONE SOD PHOSPHATE PF 10 MG/ML IJ SOLN: 4.0000 mg | Freq: Once | INTRAMUSCULAR | Status: DC

## 2024-09-20 MED ORDER — MUPIROCIN 2 % EX OINT: 1.0000 | TOPICAL_OINTMENT | Freq: Two times a day (BID) | CUTANEOUS | 0 refills | Status: AC

## 2024-09-20 MED ORDER — FENTANYL CITRATE (PF) 50 MCG/ML IJ SOSY
50.0000 ug | PREFILLED_SYRINGE | Freq: Once | INTRAMUSCULAR | Status: AC
  Administered 2024-09-20: 50 ug via INTRAVENOUS
  Filled 2024-09-20: qty 2

## 2024-09-20 MED ORDER — LACTATED RINGERS IV BOLUS
250.0000 mL | Freq: Once | INTRAVENOUS | Status: AC
  Administered 2024-09-20: 250 mL via INTRAVENOUS

## 2024-09-20 MED ORDER — FENTANYL CITRATE (PF) 100 MCG/2ML IJ SOLN
INTRAMUSCULAR | Status: DC | PRN
  Administered 2024-09-20 (×2): 50 ug via INTRAVENOUS

## 2024-09-20 MED ORDER — LOSARTAN POTASSIUM 50 MG PO TABS
100.0000 mg | ORAL_TABLET | Freq: Every day | ORAL | Status: DC
  Administered 2024-09-21: 100 mg via ORAL
  Filled 2024-09-20: qty 2

## 2024-09-20 MED ORDER — LEVOTHYROXINE SODIUM 25 MCG PO TABS
137.0000 ug | ORAL_TABLET | Freq: Every day | ORAL | Status: DC
  Administered 2024-09-21: 137 ug via ORAL
  Filled 2024-09-20 (×2): qty 1

## 2024-09-20 MED ORDER — ONDANSETRON HCL 4 MG PO TABS: 4.0000 mg | ORAL_TABLET | Freq: Three times a day (TID) | ORAL | 0 refills | Status: AC | PRN

## 2024-09-20 MED ORDER — MELOXICAM 15 MG PO TABS: 15.0000 mg | ORAL_TABLET | Freq: Every day | ORAL | 0 refills | Status: AC

## 2024-09-20 MED ORDER — ISOPROPYL ALCOHOL 70 % SOLN
Status: AC
  Filled 2024-09-20: qty 480

## 2024-09-20 MED ORDER — CHLORTHALIDONE 25 MG PO TABS: 25.0000 mg | ORAL_TABLET | Freq: Every day | ORAL | Status: DC

## 2024-09-20 MED ORDER — SODIUM CHLORIDE 0.9 % IV SOLN: INTRAVENOUS | Status: DC

## 2024-09-20 MED ORDER — DOCUSATE SODIUM 100 MG PO CAPS
100.0000 mg | ORAL_CAPSULE | Freq: Two times a day (BID) | ORAL | Status: DC
  Administered 2024-09-20 – 2024-09-21 (×2): 100 mg via ORAL
  Filled 2024-09-20 (×2): qty 1

## 2024-09-20 MED ORDER — ONDANSETRON HCL 4 MG/2ML IJ SOLN
INTRAMUSCULAR | Status: DC | PRN
  Administered 2024-09-20: 4 mg via INTRAVENOUS

## 2024-09-20 MED ORDER — BUPIVACAINE-EPINEPHRINE (PF) 0.5% -1:200000 IJ SOLN
INTRAMUSCULAR | Status: DC | PRN
  Administered 2024-09-20: 80 mL

## 2024-09-20 MED ORDER — FENTANYL CITRATE (PF) 100 MCG/2ML IJ SOLN
INTRAMUSCULAR | Status: AC
  Filled 2024-09-20: qty 2

## 2024-09-20 MED ORDER — PROPOFOL 10 MG/ML IV BOLUS
INTRAVENOUS | Status: AC
  Filled 2024-09-20: qty 20

## 2024-09-20 MED ORDER — CLONIDINE HCL (ANALGESIA) 100 MCG/ML EP SOLN
EPIDURAL | Status: DC | PRN
  Administered 2024-09-20: 80 ug

## 2024-09-20 MED ORDER — CEFAZOLIN SODIUM-DEXTROSE 2-4 GM/100ML-% IV SOLN
INTRAVENOUS | Status: AC
  Filled 2024-09-20: qty 100

## 2024-09-20 MED ORDER — BUPIVACAINE LIPOSOME 1.3 % IJ SUSP: 20.0000 mL | Freq: Once | INTRAMUSCULAR | Status: DC

## 2024-09-20 MED ORDER — INSULIN ASPART 100 UNIT/ML IJ SOLN: 0.0000 [IU] | INTRAMUSCULAR | Status: DC | PRN

## 2024-09-20 MED ORDER — PROPOFOL 500 MG/50ML IV EMUL
INTRAVENOUS | Status: DC | PRN
  Administered 2024-09-20: 75 ug/kg/min via INTRAVENOUS

## 2024-09-20 MED ORDER — LIDOCAINE HCL (PF) 2 % IJ SOLN
INTRAMUSCULAR | Status: AC
  Filled 2024-09-20: qty 5

## 2024-09-20 MED ORDER — HYDROMORPHONE HCL 1 MG/ML IJ SOLN: 0.5000 mg | INTRAMUSCULAR | Status: DC | PRN

## 2024-09-20 MED ORDER — ONDANSETRON HCL 4 MG/2ML IJ SOLN: 4.0000 mg | Freq: Four times a day (QID) | INTRAMUSCULAR | Status: DC | PRN

## 2024-09-20 MED ORDER — ORAL CARE MOUTH RINSE: 15.0000 mL | Freq: Once | OROMUCOSAL | Status: AC

## 2024-09-20 MED ORDER — CHLORHEXIDINE GLUCONATE 4 % EX SOLN: 1.0000 | CUTANEOUS | 1 refills | Status: DC

## 2024-09-20 MED ORDER — GABAPENTIN 100 MG PO CAPS
100.0000 mg | ORAL_CAPSULE | Freq: Three times a day (TID) | ORAL | Status: DC
  Administered 2024-09-20 – 2024-09-21 (×2): 100 mg via ORAL
  Filled 2024-09-20 (×2): qty 1

## 2024-09-20 MED ORDER — KETOROLAC TROMETHAMINE 15 MG/ML IJ SOLN
INTRAMUSCULAR | Status: AC
  Filled 2024-09-20: qty 1

## 2024-09-20 MED ORDER — BUPIVACAINE IN DEXTROSE 0.75-8.25 % IT SOLN
INTRATHECAL | Status: DC | PRN
  Administered 2024-09-20: 1.6 mL via INTRATHECAL

## 2024-09-20 MED ORDER — KETOROLAC TROMETHAMINE 15 MG/ML IJ SOLN
7.5000 mg | Freq: Four times a day (QID) | INTRAMUSCULAR | Status: DC
  Administered 2024-09-20 – 2024-09-21 (×3): 7.5 mg via INTRAVENOUS
  Filled 2024-09-20 (×2): qty 1

## 2024-09-20 MED ORDER — CEFAZOLIN SODIUM-DEXTROSE 2-4 GM/100ML-% IV SOLN
2.0000 g | Freq: Four times a day (QID) | INTRAVENOUS | Status: AC
  Administered 2024-09-20 (×2): 2 g via INTRAVENOUS
  Filled 2024-09-20: qty 100

## 2024-09-20 MED ORDER — TRANEXAMIC ACID-NACL 1000-0.7 MG/100ML-% IV SOLN
1000.0000 mg | INTRAVENOUS | Status: AC
  Administered 2024-09-20: 1000 mg via INTRAVENOUS
  Filled 2024-09-20: qty 100

## 2024-09-20 MED ORDER — SODIUM CHLORIDE (PF) 0.9 % IJ SOLN
INTRAMUSCULAR | Status: AC
  Filled 2024-09-20: qty 50

## 2024-09-20 MED ORDER — CEFAZOLIN SODIUM-DEXTROSE 2-4 GM/100ML-% IV SOLN
2.0000 g | INTRAVENOUS | Status: AC
  Administered 2024-09-20: 2 g via INTRAVENOUS
  Filled 2024-09-20: qty 100

## 2024-09-20 MED ORDER — ROPIVACAINE HCL 5 MG/ML IJ SOLN
INTRAMUSCULAR | Status: DC | PRN
  Administered 2024-09-20: 30 mL via PERINEURAL

## 2024-09-20 MED ORDER — DIPHENHYDRAMINE HCL 12.5 MG/5ML PO ELIX: 12.5000 mg | ORAL_SOLUTION | ORAL | Status: DC | PRN

## 2024-09-20 MED ORDER — DULOXETINE HCL 60 MG PO CPEP
60.0000 mg | ORAL_CAPSULE | Freq: Every day | ORAL | Status: DC
  Administered 2024-09-21: 60 mg via ORAL
  Filled 2024-09-20: qty 1

## 2024-09-20 MED ORDER — DROPERIDOL 2.5 MG/ML IJ SOLN: 0.6250 mg | Freq: Once | INTRAMUSCULAR | Status: DC | PRN

## 2024-09-20 MED ORDER — ACETAMINOPHEN 325 MG PO TABS: 325.0000 mg | ORAL_TABLET | Freq: Four times a day (QID) | ORAL | Status: DC | PRN

## 2024-09-20 MED ORDER — HYDROMORPHONE HCL 1 MG/ML IJ SOLN: 0.2500 mg | INTRAMUSCULAR | Status: DC | PRN

## 2024-09-20 MED ORDER — OXYCODONE HCL 5 MG PO TABS
ORAL_TABLET | ORAL | Status: AC
  Filled 2024-09-20: qty 2

## 2024-09-20 MED ORDER — SODIUM CHLORIDE 0.9 % IR SOLN
Status: DC | PRN
Start: 2024-09-20 — End: 2024-09-20
  Administered 2024-09-20: 3000 mL

## 2024-09-20 MED ORDER — MIDAZOLAM HCL (PF) 2 MG/2ML IJ SOLN: 1.0000 mg | Freq: Once | INTRAMUSCULAR | Status: DC

## 2024-09-20 MED ORDER — POVIDONE-IODINE 10 % EX SWAB: 2.0000 | Freq: Once | CUTANEOUS | Status: DC

## 2024-09-20 SURGICAL SUPPLY — 46 items
BAG COUNTER SPONGE SURGICOUNT (BAG) IMPLANT
BLADE SAG 18X100X1.27 (BLADE) ×1 IMPLANT
BLADE SAW SAG 35X64 .89 (BLADE) ×1 IMPLANT
BNDG COHESIVE 3X5 TAN ST LF (GAUZE/BANDAGES/DRESSINGS) ×1 IMPLANT
BNDG ELASTIC 6X10 VLCR STRL LF (GAUZE/BANDAGES/DRESSINGS) ×1 IMPLANT
BOWL SMART MIX CTS (DISPOSABLE) IMPLANT
CEMENT BONE REFOBACIN R1X40 US (Cement) IMPLANT
CHLORAPREP W/TINT 26 (MISCELLANEOUS) ×2 IMPLANT
COMPONENT FEM CMT PRSONA SZ7RT (Joint) IMPLANT
COVER SURGICAL LIGHT HANDLE (MISCELLANEOUS) ×1 IMPLANT
CUFF TRNQT CYL 34X4.125X (TOURNIQUET CUFF) ×1 IMPLANT
DERMABOND ADVANCED .7 DNX12 (GAUZE/BANDAGES/DRESSINGS) ×2 IMPLANT
DRAPE INCISE IOBAN 85X60 (DRAPES) ×1 IMPLANT
DRAPE SHEET LG 3/4 BI-LAMINATE (DRAPES) ×1 IMPLANT
DRAPE U-SHAPE 47X51 STRL (DRAPES) ×1 IMPLANT
DRSG AQUACEL AG ADV 3.5X10 (GAUZE/BANDAGES/DRESSINGS) ×1 IMPLANT
ELECT REM PT RETURN 15FT ADLT (MISCELLANEOUS) ×1 IMPLANT
GAUZE SPONGE 4X4 12PLY STRL (GAUZE/BANDAGES/DRESSINGS) ×1 IMPLANT
GLOVE BIO SURGEON STRL SZ 6.5 (GLOVE) ×2 IMPLANT
GLOVE BIOGEL PI IND STRL 6.5 (GLOVE) ×1 IMPLANT
GLOVE BIOGEL PI IND STRL 8 (GLOVE) ×1 IMPLANT
GLOVE SURG ORTHO 8.0 STRL STRW (GLOVE) ×2 IMPLANT
GOWN STRL REUS W/ TWL XL LVL3 (GOWN DISPOSABLE) ×2 IMPLANT
HOLDER FOLEY CATH W/STRAP (MISCELLANEOUS) ×1 IMPLANT
HOOD PEEL AWAY T7 (MISCELLANEOUS) ×3 IMPLANT
INSERT TIB ARTISURF SZ6-7 R 11 (Joint) IMPLANT
KIT TURNOVER KIT A (KITS) ×1 IMPLANT
MANIFOLD NEPTUNE II (INSTRUMENTS) ×1 IMPLANT
MARKER SKIN DUAL TIP RULER LAB (MISCELLANEOUS) ×1 IMPLANT
NS IRRIG 1000ML POUR BTL (IV SOLUTION) ×1 IMPLANT
PACK TOTAL KNEE CUSTOM (KITS) ×1 IMPLANT
PENCIL SMOKE EVACUATOR (MISCELLANEOUS) ×1 IMPLANT
PIN DRILL HDLS TROCAR 75 4PK (PIN) IMPLANT
SCREW HEADED 33MM KNEE (MISCELLANEOUS) IMPLANT
SET HNDPC FAN SPRY TIP SCT (DISPOSABLE) ×1 IMPLANT
STEM POLY PAT PLY 32M KNEE (Knees) IMPLANT
STEM TIBIA 5 DEG SZ E R KNEE (Knees) IMPLANT
STRIP CLOSURE SKIN 1/2X4 (GAUZE/BANDAGES/DRESSINGS) ×1 IMPLANT
SUT MNCRL AB 3-0 PS2 18 (SUTURE) ×1 IMPLANT
SUT STRATAFIX 14 PDO 48 VLT (SUTURE) ×1 IMPLANT
SUT VIC AB 2-0 CT2 27 (SUTURE) ×2 IMPLANT
SUTURE STRATFX 0 PDS 27 VIOLET (SUTURE) ×1 IMPLANT
TRAY FOLEY MTR SLVR 14FR STAT (SET/KITS/TRAYS/PACK) IMPLANT
TUBE SUCTION HIGH CAP CLEAR NV (SUCTIONS) ×1 IMPLANT
UNDERPAD 30X36 HEAVY ABSORB (UNDERPADS AND DIAPERS) ×1 IMPLANT
WRAP KNEE MAXI GEL POST OP (GAUZE/BANDAGES/DRESSINGS) ×1 IMPLANT

## 2024-09-20 NOTE — Anesthesia Procedure Notes (Signed)
 Date/Time: 09/20/2024 9:47 AM  Performed by: Therisa Doyal CROME, CRNAOxygen Delivery Method: Simple face mask

## 2024-09-20 NOTE — Op Note (Signed)
 DATE OF SURGERY:  09/20/2024 TIME: 1:04 PM  PATIENT NAME:  Alexis Mcneil   AGE: 68 y.o.    PRE-OPERATIVE DIAGNOSIS: End-stage right knee osteoarthritis  POST-OPERATIVE DIAGNOSIS:  Same  PROCEDURE: Cemented right total Knee Arthroplasty  SURGEON:  Vong Garringer A Lelend Heinecke, MD   ASSISTANT: Bernarda Mclean, PA-C, present and scrubbed throughout the case, critical for assistance with exposure, retraction, instrumentation, and closure.   OPERATIVE IMPLANTS:  Size 7 right cemented Zimmer persona femur, right size E tibial baseplate, 32 mm cemented three-peg patella, 11 mm MC poly insert  Implant Name Type Inv. Item Serial No. Manufacturer Lot No. LRB No. Used Action  CEMENT BONE REFOBACIN [id number] US  - [id number] Cement CEMENT BONE REFOBACIN R1X40 US   [employer](ORTH,TRAU,BIO,SG) [id number] Right 2 Implanted  STEM TIBIA 5 DEG SZ E R KNEE - [id number] Knees STEM TIBIA 5 DEG SZ E R KNEE  [employer](ORTH,TRAU,BIO,SG) [id number] Right 1 Implanted  COMPONENT FEM CMT PRSONA SZ7RT - [id number] Joint COMPONENT FEM CMT PRSONA SZ7RT  [employer](ORTH,TRAU,BIO,SG) [id number] Right 1 Implanted  STEM POLY PAT PLY 31M KNEE - [id number] Knees STEM POLY PAT PLY 31M KNEE  [employer](ORTH,TRAU,BIO,SG) [id number] Right 1 Implanted  INSERT TIB ARTISURF SZ6-7 R 11 - [id number] Joint INSERT TIB ARTISURF SZ6-7 R 11  [employer]MER [employer]ON(ORTH,TRAU,BIO,SG) [id number] Right 1 Implanted      PREOPERATIVE INDICATIONS:  Alexis Mcneil is a 68 y.o. year old female with end stage bone on bone degenerative arthritis of the knee who failed conservative treatment, including injections, antiinflammatories, activity modification, and assistive devices, and had significant impairment of their activities of daily living, and elected for Total Knee Arthroplasty.   The risks, benefits, and alternatives were discussed at length including but not limited to the risks of infection, bleeding, nerve injury, stiffness, blood clots, the need for  revision surgery, cardiopulmonary complications, among others, and they were willing to proceed.  ESTIMATED BLOOD LOSS: 50cc  OPERATIVE DESCRIPTION:   Once adequate anesthesia was induced, preoperative antibiotics, 2 gm of ancef ,1 gm of Tranexamic Acid, and 8 mg of Decadron  administered, the patient was positioned supine with a right thigh tourniquet placed.  The right lower extremity was prepped and draped in sterile fashion.  A time-  out was performed identifying the patient, planned procedure, and the appropriate extremity.     The leg was  exsanguinated, tourniquet elevated to 250 mmHg.  A midline incision was  made followed by median parapatellar arthrotomy. Anterior horn of the medial meniscus was released and resected. A medial release was performed, the infrapatellar fat pad was resected with care taken to protect the patellar tendon. The suprapatellar fat was removed to exposed the distal anterior femur. The anterior horn of the lateral meniscus and ACL were released.    Following initial  exposure, I first started with the femur  The femoral  canal was opened with a drill, canal was suctioned to try to prevent fat emboli.  An  intramedullary rod was passed set at 5 degrees valgus, 10 mm. The distal femur was resected.  Following this resection, the tibia was  subluxated anteriorly.  Using the extramedullary guide, 10 mm of bone was resected off   the proximal lateral tibia.  We confirmed the gap would be  stable medially and laterally with a size 10mm spacer block as well as confirmed that the tibial cut was perpendicular in the coronal plane, checking with an alignment rod.    Once this was done, the posterior femoral referencing femoral  sizer was placed under to the posterior condyles with 3 degrees of external rotational which was parallel to the transepicondylar axis and perpendicular to [employer]. The femur was sized to be a size 7 in the anterior-  posterior dimension. The   anterior, posterior, and  chamfer cuts were made without difficulty nor   notching making certain that I was along the anterior cortex to help  with flexion gap stability. Next a laminar spreader was placed with the knee in flexion and the medial lateral menisci were resected.  5 cc of the Exparel  mixture was injected in the medial side of the back of the knee and 3 cc in the lateral side.  1/2 inch curved osteotome was used to resect posterior osteophyte that was then removed with a pituitary rongeur.       At this point, the tibia was sized to be a size E.  The size E tray was  then pinned in position. Trial reduction was now carried with a 7 femur, E tibia, a 11 mm MC insert.  The knee had full extension and was stable to varus valgus stress in extension.  The knee was slightly tight in flexion and the PCL was partially released.   Attention was next directed to the patella.  Precut  measurement was noted to be 21 mm.  I resected down to 13 mm and used a  32mm patellar button to restore patellar height as well as cover the cut surface.     The patella lug holes were drilled and a 32 mm patella poly trial was placed.    The knee was brought to full extension with good flexion stability with the patella tracking through the trochlea without application of pressure.     Next the femoral component was again assessed and determined to be seated and appropriately lateralized.  The femoral lug holes were drilled.  The femoral component was then removed. Tibial component was again assessed and felt to be seated and appropriately rotated with the medial third of the tubercle. The tibia was then drilled, and keel punched.     Final components were  opened and antibiotic cement was mixed.      Final implants were then  cemented onto cleaned and dried cut surfaces of bone with the knee brought to extension with a 11 mm MC poly.  The knee was irrigated with sterile Betadine diluted in saline as well as pulse  lavage normal saline.  The synovial lining was  then injected a dilute Exparel  with 30cc of 0.25% marcaine  with epinephrine.         Once the cement had fully cured, excess cement was removed throughout the knee.  I confirmed that I was satisfied with the range of motion and stability, and the final 11mm MC poly insert was chosen.  It was placed into the knee.         The tourniquet had been let down at 60 minutes.  No significant hemostasis was required.  The medial parapatellar arthrotomy was then reapproximated using #1 Vicryl and #1 Stratafix sutures with the knee  in flexion.  The remaining wound was closed with 0 stratafix, 2-0 Vicryl, and running 3-0 Monocryl. The knee was cleaned, dried, dressed sterilely using Dermabond and   Aquacel dressing.  The patient was then brought to recovery room in stable condition, tolerating the procedure  well. There were no complications.   Post op recs: WB: WBAT Abx: ancef  Imaging: PACU xrays DVT  prophylaxis: Aspirin 81mg  BID x4 weeks Follow up: 2 weeks after surgery for a wound check with Dr. Edna at [facility].  Address: [address], [place], [place] [address]  Office Phone: [phone]  Toribio Edna, MD Orthopaedic Surgery

## 2024-09-20 NOTE — Progress Notes (Signed)
   09/20/24 2126  BiPAP/CPAP/SIPAP  $ Non-Invasive Home Ventilator  Initial  BiPAP/CPAP/SIPAP Pt Type Adult  BiPAP/CPAP/SIPAP DREAMSTATIOND  Mask Type Full face mask  Dentures removed? Not applicable  Mask Size Medium  Respiratory Rate 15 breaths/min  FiO2 (%) 21 %  Patient Home Machine No  Patient Home Mask Yes  Patient Home Tubing Yes  Auto Titrate Yes  Minimum cmH2O 5 cmH2O  Maximum cmH2O 20 cmH2O  CPAP/SIPAP surface wiped down Yes  Device Plugged into [employer] Power Outlet Yes

## 2024-09-20 NOTE — Anesthesia Postprocedure Evaluation (Signed)
 Anesthesia Post Note  Patient: Alexis Mcneil  Procedure(s) Performed: ARTHROPLASTY, KNEE, TOTAL (Right: Knee)     Patient location during evaluation: PACU Anesthesia Type: Spinal Level of consciousness: awake and alert Pain management: pain level controlled Vital Signs Assessment: post-procedure vital signs reviewed and stable Respiratory status: spontaneous breathing Cardiovascular status: stable Anesthetic complications: no   No notable events documented.  Last Vitals:  Vitals:   09/20/24 1245 09/20/24 1255  BP: 138/66 135/69  Pulse: 78 72  Resp: 17 18  Temp:    SpO2: 93% 94%    Last Pain:  Vitals:   09/20/24 1255  TempSrc:   PainSc: 1     LLE Motor Response: Purposeful movement (09/20/24 1255) LLE Sensation: Numbness;Tingling (09/20/24 1255) RLE Motor Response: Purposeful movement (09/20/24 1255) RLE Sensation: Numbness;Tingling (09/20/24 1255) L Sensory Level: S1-Sole of foot, small toes (09/20/24 1255) R Sensory Level: S1-Sole of foot, small toes (09/20/24 1255)  Norleen Pope

## 2024-09-20 NOTE — Anesthesia Procedure Notes (Signed)
 Spinal  Staffing Performed by: Therisa Doyal CROME, CRNA Authorized by: Darlyn Rush, MD

## 2024-09-20 NOTE — Discharge Instructions (Signed)

## 2024-09-20 NOTE — Transfer of Care (Signed)
 Immediate Anesthesia Transfer of Care Note  Patient: Alexis Mcneil  Procedure(s) Performed: ARTHROPLASTY, KNEE, TOTAL (Right: Knee)  Patient Location: PACU  Anesthesia Type:Spinal  Level of Consciousness: awake, alert , and oriented  Airway & Oxygen Therapy: Patient Spontanous Breathing and Patient connected to face mask oxygen  Post-op Assessment: Report given to RN and Post -op Vital signs reviewed and stable  Post vital signs: Reviewed and stable  Last Vitals:  Vitals Value Taken Time  BP 139/71 09/20/24 12:06  Temp    Pulse 83 09/20/24 12:07  Resp    SpO2 100 % 09/20/24 12:07  Vitals shown include unfiled device data.  Last Pain:  Vitals:   09/20/24 0935  TempSrc:   PainSc: 0-No pain         Complications: No notable events documented.

## 2024-09-20 NOTE — Progress Notes (Signed)
 Orthopedic Tech Progress Note Patient Details:  Alexis Mcneil August 30, 1956 [id number]  Ortho Devices Type of Ortho Device: Bone foam zero knee Ortho Device/Splint Location: right Ortho Device/Splint Interventions: Ordered, Application, Adjustment   Post Interventions Patient Tolerated: Well Instructions Provided: Adjustment of device, Care of device  Alexis Mcneil 09/20/2024, 2:33 PM

## 2024-09-20 NOTE — Interval H&P Note (Signed)
 The patient has been re-examined, and the chart reviewed, and there have been no interval changes to the documented history and physical.    Plan for R TKA for right knee OA  The operative side was examined and the patient was confirmed to have sensation to DPN, SPN, TN intact, Motor EHL, ext, flex 5/5, and DP 2+, PT 2+, No significant edema.   The risks, benefits, and alternatives have been discussed at length with patient, and the patient is willing to proceed.  Right knee marked. Consent has been signed.

## 2024-09-20 NOTE — Evaluation (Signed)
 Physical Therapy Evaluation Patient Details Name: Alexis Mcneil MRN: [id number] DOB: 05-06-1956 Today's Date: 09/20/2024  History of Present Illness  68 yo female presents to therapy s/p R TKA on 09/20/2024 due to failure of conservative measures. Pt PMH includes but is not limited to: OA, CKD, gout, peripheral neuropathy, OSA, HLD, hypothyroidism, anemia, HTN, DM II, and L RTC tear.  Clinical Impression      DEYCI GESELL is a 68 y.o. female POD 0 s/p R TKA. Patient reports mod I with mobility at baseline. Patient is now limited by functional impairments (see PT problem list below). Pt limited with mobility at time of eval due to slow regression of anesthesia impacting R LE motor coordination, control and strength. Patient will benefit from continued skilled PT interventions to address impairments and progress towards PLOF. Acute PT will follow to progress mobility and stair training in preparation for safe discharge home.     If plan is discharge home, recommend the following: A little help with walking and/or transfers;A little help with bathing/dressing/bathroom;Assistance with cooking/housework;Assist for transportation;Help with stairs or ramp for entrance   Can travel by private vehicle        Equipment Recommendations None recommended by PT  Recommendations for Other Services       Functional Status Assessment Patient has had a recent decline in their functional status and demonstrates the ability to make significant improvements in function in a reasonable and predictable amount of time.     Precautions / Restrictions Precautions Precautions: Fall;Knee Restrictions Weight Bearing Restrictions Per Provider Order: No      Mobility  Bed Mobility                    Transfers                        Ambulation/Gait                  Stairs            Wheelchair Mobility     Tilt Bed    Modified Rankin (Stroke Patients Only)        Balance Overall balance assessment: Needs assistance                                           Pertinent Vitals/Pain Pain Assessment Pain Assessment: 0-10 Pain Score: 2  Pain Location: R knee and LE Pain Descriptors / Indicators: Aching, Constant, Discomfort, Dull, Operative site guarding Pain Intervention(s): Limited activity within patient's tolerance, Monitored during session, Premedicated before session, Ice applied, Repositioned    Home Living Family/patient expects to be discharged to:: Private residence Living Arrangements: Spouse/significant other Available Help at Discharge: Family Type of Home: House Home Access: Stairs to enter Entrance Stairs-Rails: None Entrance Stairs-Number of Steps: 1   Home Layout: One level Home Equipment: [employer] (2 wheels);Cane - single point;Shower seat;Grab bars - tub/shower      Prior Function Prior Level of Function : Independent/Modified Independent;Driving             Mobility Comments: mod I with all ADLs, self care tasks and IADLs with [facility]       Extremity/Trunk Assessment        Lower Extremity Assessment Lower Extremity Assessment: RLE deficits/detail RLE Deficits / Details: ankle DF/PF 4/5; unable to complete SLR due  to quad lag RLE Sensation: decreased light touch    Cervical / Trunk Assessment Cervical / Trunk Assessment: Normal  Communication   Communication Communication: No apparent difficulties    Cognition Arousal: Alert Behavior During Therapy: WFL for tasks assessed/performed   PT - Cognitive impairments: No apparent impairments                         Following commands: Intact       Cueing       General Comments      Exercises Total Joint Exercises Ankle Circles/Pumps: AROM, Both, 15 reps Quad Sets: Right, 5 reps, AAROM Short Arc Quad: Right, 5 reps, PROM Heel Slides: AROM, Right, 5 reps Hip ABduction/ADduction: AROM, Right, 5 reps Straight Leg  Raises: Right, 5 reps, PROM   Assessment/Plan    PT Assessment Patient needs continued PT services  PT Problem List Decreased strength;Decreased range of motion;Decreased activity tolerance;Decreased balance;Decreased mobility;Decreased coordination;Pain       PT Treatment Interventions DME instruction;Gait training;Stair training;Functional mobility training;Therapeutic activities;Therapeutic exercise;Balance training;Neuromuscular re-education;Patient/family education;Modalities    PT Goals (Current goals can be found in the Care Plan section)  Acute Rehab PT Goals Patient Stated Goal: to be able to get back to walking and site seeing PT Goal Formulation: With patient Time For Goal Achievement: 10/04/24 Potential to Achieve Goals: Good    Frequency 7X/week     Co-evaluation               AM-PAC PT 6 Clicks Mobility  Outcome Measure Help needed turning from your back to your side while in a flat bed without using bedrails?: None Help needed moving from lying on your back to sitting on the side of a flat bed without using bedrails?: Total Help needed moving to and from a bed to a chair (including a wheelchair)?: Total Help needed standing up from a chair using your arms (e.g., wheelchair or bedside chair)?: Total Help needed to walk in hospital room?: Total Help needed climbing 3-5 steps with a railing? : Total 6 Click Score: 9    End of Session   Activity Tolerance: Other (comment);Patient tolerated treatment well (pt not appropriate at this time to progress with PT eval due to slow regression of anethesia impacing pt R LE motor control, coordination and strength) Patient left: in bed;with call bell/phone within reach;with family/visitor present Nurse Communication: Mobility status PT Visit Diagnosis: Unsteadiness on feet (R26.81);Other abnormalities of gait and mobility (R26.89);Muscle weakness (generalized) (M62.81);Difficulty in walking, not elsewhere classified  (R26.2);Pain Pain - Right/Left: Right Pain - part of body: Leg;Knee    Time: 8490-8465 PT Time Calculation (min) (ACUTE ONLY): 25 min   Charges:   PT Evaluation $PT Eval Low Complexity: 1 Low PT Treatments $Therapeutic Exercise: 8-22 mins PT General Charges $$ ACUTE PT VISIT: 1 Visit         [place], PT Acute Rehab   [place] VEAR Drone 09/20/2024, 4:31 PM

## 2024-09-20 NOTE — Anesthesia Procedure Notes (Signed)
 Anesthesia Regional Block: Adductor canal block   Pre-Anesthetic Checklist: , timeout performed,  Correct Patient, Correct Site, Correct Laterality,  Correct Procedure, Correct Position, site marked,  Risks and benefits discussed,  Surgical consent,  Pre-op evaluation,  At surgeon's request and post-op pain management  Laterality: Lower and Right  Prep: chloraprep       Needles:  Injection technique: Single-shot  Needle Type: Stimiplex     Needle Length: 9cm  Needle Gauge: 21     Additional Needles:   Procedures:,,,, ultrasound used (permanent image in chart),,    Narrative:  Start time: 09/20/2024 8:55 AM End time: 09/20/2024 9:15 AM Injection made incrementally with aspirations every 5 mL.  Performed by: Personally  Anesthesiologist: Darlyn Rush, MD  Additional Notes: BP cuff, EKG monitors applied. Sedation begun. Artery and nerve location verified with ultrasound. Anesthetic injected incrementally (5ml), slowly, and after negative aspirations under direct u/s guidance. Good fascial/perineural spread. Tolerated well.

## 2024-09-20 NOTE — Anesthesia Procedure Notes (Addendum)
 Spinal  Patient location during procedure: OR Start time: 09/20/2024 9:58 AM End time: 09/20/2024 10:02 AM Reason for block: surgical anesthesia Staffing Performed: resident/CRNA  Resident/CRNA: Therisa Doyal CROME, CRNA Performed by: Therisa Doyal CROME, CRNA Authorized by: Darlyn Rush, MD   Preanesthetic Checklist Completed: patient identified, IV checked, site marked, risks and benefits discussed, surgical consent, monitors and equipment checked, pre-op evaluation and timeout performed Spinal Block Patient position: sitting Prep: DuraPrep and site prepped and draped Patient monitoring: heart rate, cardiac monitor, continuous pulse ox and blood pressure Approach: midline Location: L3-4 Injection technique: single-shot Needle Needle type: Pencan  Needle gauge: 24 G Needle length: 10 cm Assessment Sensory level: T6 Events: CSF return Additional Notes Kit expiration date 11/10/2026 and lot #[id number] Clear free flow CSF, negative heme, negative paresthesia Tolerated well and returned to supine position

## 2024-09-21 ENCOUNTER — Encounter (HOSPITAL_COMMUNITY): Payer: Self-pay | Admitting: Orthopedic Surgery

## 2024-09-21 DIAGNOSIS — E039 Hypothyroidism, unspecified: Secondary | ICD-10-CM | POA: Diagnosis not present

## 2024-09-21 DIAGNOSIS — N183 Chronic kidney disease, stage 3 unspecified: Secondary | ICD-10-CM | POA: Diagnosis not present

## 2024-09-21 DIAGNOSIS — Z7982 Long term (current) use of aspirin: Secondary | ICD-10-CM | POA: Diagnosis not present

## 2024-09-21 DIAGNOSIS — M1711 Unilateral primary osteoarthritis, right knee: Secondary | ICD-10-CM | POA: Diagnosis not present

## 2024-09-21 DIAGNOSIS — E1122 Type 2 diabetes mellitus with diabetic chronic kidney disease: Secondary | ICD-10-CM | POA: Diagnosis not present

## 2024-09-21 DIAGNOSIS — I129 Hypertensive chronic kidney disease with stage 1 through stage 4 chronic kidney disease, or unspecified chronic kidney disease: Secondary | ICD-10-CM | POA: Diagnosis not present

## 2024-09-21 LAB — BASIC METABOLIC PANEL WITH GFR
Anion gap: 9 (ref 5–15)
BUN: 20 mg/dL (ref 8–23)
CO2: 28 mmol/L (ref 22–32)
Calcium: 9.6 mg/dL (ref 8.9–10.3)
Chloride: 100 mmol/L (ref 98–111)
Creatinine, Ser: 0.77 mg/dL (ref 0.44–1.00)
GFR, Estimated: >60 mL/min (ref >60)
Glucose, Bld: 137 mg/dL — ABNORMAL HIGH (ref 70–99)
Potassium: 3.8 mmol/L (ref 3.5–5.1)
Sodium: 137 mmol/L (ref 135–145)

## 2024-09-21 LAB — CBC
HCT: 27.4 % — ABNORMAL LOW (ref 36.0–46.0)
Hemoglobin: 9.3 g/dL — ABNORMAL LOW (ref 12.0–15.0)
MCH: 29 pg (ref 26.0–34.0)
MCHC: 33.9 g/dL (ref 30.0–36.0)
MCV: 85.4 fL (ref 80.0–100.0)
Platelets: 226 K/uL (ref 150–400)
RBC: 3.21 MIL/uL — ABNORMAL LOW (ref 3.87–5.11)
RDW: 15.5 % (ref 11.5–15.5)
WBC: 8.2 K/uL (ref 4.0–10.5)
nRBC: 0 % (ref 0.0–0.2)

## 2024-09-21 NOTE — Discharge Summary (Signed)
 Physician Discharge Summary  Patient ID: Alexis Mcneil MRN: [id number] DOB/AGE: October 08, 1956 68 y.o.  Admit date: 09/20/2024 Discharge date: 09/21/2024  Admission Diagnoses:  Localized osteoarthritis of right knee  Discharge Diagnoses:  Principal Problem:   Localized osteoarthritis of right knee   Past Medical History:  Diagnosis Date   Anemia    Arthritis    KNEE,SHOULDERS   Cataract January 2021   Eye appt visit   Diabetes mellitus without complication (HCC)    GERD (gastroesophageal reflux disease)    Heart murmur    Hyperlipidemia    Hypertension    IFG (impaired fasting glucose)    Neuromuscular disorder (HCC)    NEUROPATHY FEET   OSA (obstructive sleep apnea)    CPAP   Sleep apnea    CPAP   Thyroid  disease     Surgeries: Procedure(s): ARTHROPLASTY, KNEE, TOTAL on 09/20/2024   Consultants (if any):   Discharged Condition: Improved  Hospital Course: Alexis Mcneil is an 68 y.o. female who was admitted 09/20/2024 with a diagnosis of Localized osteoarthritis of right knee and went to the operating room on 09/20/2024 and underwent the above named procedures.    She was given perioperative antibiotics:  Anti-infectives (From admission, onward)    Start     Dose/Rate Route Frequency Ordered Stop   09/20/24 1600  ceFAZolin  (ANCEF ) IVPB 2g/100 mL premix        2 g 200 mL/hr over 30 Minutes Intravenous Every 6 hours 09/20/24 1258 09/20/24 2206   09/20/24 0730  ceFAZolin  (ANCEF ) IVPB 2g/100 mL premix        2 g 200 mL/hr over 30 Minutes Intravenous On call to O.R. 09/20/24 9272 09/20/24 1014     .  She was given sequential compression devices, early ambulation, and aspirin for DVT prophylaxis.  She benefited maximally from the hospital stay and there were no complications.    Recent vital signs:  Vitals:   09/21/24 0148 09/21/24 0601  BP: 131/66 121/67  Pulse: 66 66  Resp: 15 16  Temp: (!) 97.4 F (36.3 C) 98.1 F (36.7 C)  SpO2: 98% 97%    Recent  laboratory studies:  Lab Results  Component Value Date   HGB 9.3 (L) 09/21/2024   HGB 11.7 (L) 09/11/2024   HGB 12.2 08/07/2024   Lab Results  Component Value Date   WBC 8.2 09/21/2024   PLT 226 09/21/2024   No results found for: INR Lab Results  Component Value Date   NA 137 09/21/2024   K 3.8 09/21/2024   CL 100 09/21/2024   CO2 28 09/21/2024   BUN 20 09/21/2024   CREATININE 0.77 09/21/2024   GLUCOSE 137 (H) 09/21/2024    Discharge Medications:   Allergies as of 09/21/2024       Reactions   Gabapentin  Other (See Comments)   Vision changes   Niacin Other (See Comments)   Threw up and passed out   Niacin And Related Nausea And Vomiting   Passed out and threw up        Medication List     STOP taking these medications    ibuprofen  800 MG tablet Commonly known as: ADVIL    methylPREDNISolone  4 MG Tbpk tablet Commonly known as: Medrol        TAKE these medications    Accu-Chek FastClix Lancet Kit Use to check blood sugar once daily. Dx code: E11.69   acetaminophen  500 MG tablet Commonly known as: TYLENOL  Take 2 tablets (1,000 mg total)  by mouth every 8 (eight) hours as needed. What changed:  how much to take when to take this reasons to take this   allopurinol  300 MG tablet Commonly known as: ZYLOPRIM  Take 0.5 tablets (150 mg total) by mouth every evening.   AMBULATORY NON FORMULARY MEDICATION Medication Name: CPAP machine and supplies with humidifier.  Please set her CPAP to 9 cm water pressure. Then download after one week so can review her apneas on 9 cm water pressure.  [facility]. Dx. OSA G47.33   aspirin EC 81 MG tablet Take 1 tablet (81 mg total) by mouth 2 (two) times daily for 28 days. Swallow whole.   atorvastatin  40 MG tablet Commonly known as: LIPITOR TAKE 1 TABLET BY MOUTH IN THE  EVENING AT 6 PM   B-6 PO Take 1 tablet by mouth in the morning.   chlorhexidine 4 % external liquid Commonly known as: HIBICLENS Apply 15 mLs  (1 Application total) topically as directed for 30 doses. Use as directed daily for 5 days every other week for 6 weeks.   chlorthalidone  25 MG tablet Commonly known as: HYGROTON  Take 1 tablet (25 mg total) by mouth daily.   DULoxetine  60 MG capsule Commonly known as: CYMBALTA  TAKE 1 CAPSULE BY MOUTH DAILY   FreeStyle Lite Devi   gabapentin  100 MG capsule Commonly known as: NEURONTIN  Take 1 capsule (100 mg total) by mouth 3 (three) times daily.   glucose blood test strip Freestyle Lite test strips.  FOR CHECKING BLOOD SUGAR ONCE DAILY   levothyroxine  137 MCG tablet Commonly known as: SYNTHROID  Take 1 tablet (137 mcg total) by mouth daily before breakfast. What changed: how much to take   losartan  100 MG tablet Commonly known as: COZAAR  TAKE 1 TABLET BY MOUTH ONCE  DAILY   MAGNESIUM  OXIDE PO Take 400 mg by mouth in the morning.   meloxicam  15 MG tablet Commonly known as: MOBIC  Take 1 tablet (15 mg total) by mouth daily for 15 days. What changed: how much to take   methocarbamol 500 MG tablet Commonly known as: ROBAXIN Take 1 tablet (500 mg total) by mouth every 8 (eight) hours as needed for up to 10 days for muscle spasms.   mupirocin ointment 2 % Commonly known as: BACTROBAN Place 1 Application into the nose 2 (two) times daily for 60 doses. Use as directed 2 times daily for 5 days every other week for 6 weeks.   ondansetron  4 MG tablet Commonly known as: Zofran  Take 1 tablet (4 mg total) by mouth every 8 (eight) hours as needed for up to 14 days for nausea or vomiting.   oxyCODONE  5 MG immediate release tablet Commonly known as: Roxicodone  Take 1 tablet (5 mg total) by mouth every 4 (four) hours as needed for up to 7 days for severe pain (pain score 7-10) or moderate pain (pain score 4-6).   pantoprazole  40 MG tablet Commonly known as: PROTONIX  TAKE 1 TABLET BY MOUTH DAILY   polyethylene glycol 17 g packet Commonly known as: MiraLax Take 17 g by mouth  daily.   potassium chloride  SA 20 MEQ tablet Commonly known as: KLOR-CON  M Take 2 tablets (40 mEq total) by mouth daily. What changed:  how much to take when to take this   Semaglutide  (2 MG/DOSE) 8 MG/3ML Sopn Inject 2 mg as directed once a week.        Diagnostic Studies: DG Knee Right Port Result Date: 09/20/2024 EXAM: 1 or 2 view(s) Xray of  the right knee 09/20/2024 12:20:00 PM COMPARISON: 07/31/2024. CLINICAL HISTORY: 747648 Post-operative state 252351 FINDINGS: Status post right total knee arthroplasty with normal alignment. No acute fracture. Expected postoperative soft tissue swelling and soft tissue and intraarticular air anteriorly. IMPRESSION: 1. Status post right total knee arthroplasty with normal alignment. Electronically signed by: Harrietta Sherry MD 09/20/2024 03:01 PM EST RP Workstation: [id number]    Disposition: Discharge disposition: 01-Home or Self Care       Discharge Instructions     Call MD / Call 911   Complete by: As directed    If you experience chest pain or shortness of breath, CALL 911 and be transported to the hospital emergency room.  If you develope a fever above 101 F, pus (white drainage) or increased drainage or redness at the wound, or calf pain, call your surgeon's office.   Call MD / Call 911   Complete by: As directed    If you experience chest pain or shortness of breath, CALL 911 and be transported to the hospital emergency room.  If you develope a fever above 101 F, pus (white drainage) or increased drainage or redness at the wound, or calf pain, call your surgeon's office.   Constipation Prevention   Complete by: As directed    Drink plenty of fluids.  Prune juice may be helpful.  You may use a stool softener, such as Colace (over the counter) 100 mg twice a day.  Use MiraLax (over the counter) for constipation as needed.   Constipation Prevention   Complete by: As directed    Drink plenty of fluids.  Prune juice may be helpful.   You may use a stool softener, such as Colace (over the counter) 100 mg twice a day.  Use MiraLax (over the counter) for constipation as needed.   Diet - low sodium heart healthy   Complete by: As directed    Diet - low sodium heart healthy   Complete by: As directed    Do not put a pillow under the knee. Place it under the heel.   Complete by: As directed    Driving restrictions   Complete by: As directed    No driving for 4-6 weeks   Increase activity slowly as tolerated   Complete by: As directed    Increase activity slowly as tolerated   Complete by: As directed    Post-operative opioid taper instructions:   Complete by: As directed    POST-OPERATIVE OPIOID TAPER INSTRUCTIONS: It is important to wean off of your opioid medication as soon as possible. If you do not need pain medication after your surgery it is ok to stop day one. Opioids include: Codeine, Hydrocodone(Norco, Vicodin), Oxycodone (Percocet, oxycontin ) and hydromorphone amongst others.  Long term and even short term use of opiods can cause: Increased pain response Dependence Constipation Depression Respiratory depression And more.  Withdrawal symptoms can include Flu like symptoms Nausea, vomiting And more Techniques to manage these symptoms Hydrate well Eat regular healthy meals Stay active Use relaxation techniques(deep breathing, meditating, yoga) Do Not substitute Alcohol to help with tapering If you have been on opioids for less than two weeks and do not have pain than it is ok to stop all together.  Plan to wean off of opioids This plan should start within one week post op of your joint replacement. Maintain the same interval or time between taking each dose and first decrease the dose.  Cut the total daily intake of opioids by one tablet  each day Next start to increase the time between doses. The last dose that should be eliminated is the evening dose.      Post-operative opioid taper instructions:    Complete by: As directed    POST-OPERATIVE OPIOID TAPER INSTRUCTIONS: It is important to wean off of your opioid medication as soon as possible. If you do not need pain medication after your surgery it is ok to stop day one. Opioids include: Codeine, Hydrocodone(Norco, Vicodin), Oxycodone (Percocet, oxycontin ) and hydromorphone amongst others.  Long term and even short term use of opiods can cause: Increased pain response Dependence Constipation Depression Respiratory depression And more.  Withdrawal symptoms can include Flu like symptoms Nausea, vomiting And more Techniques to manage these symptoms Hydrate well Eat regular healthy meals Stay active Use relaxation techniques(deep breathing, meditating, yoga) Do Not substitute Alcohol to help with tapering If you have been on opioids for less than two weeks and do not have pain than it is ok to stop all together.  Plan to wean off of opioids This plan should start within one week post op of your joint replacement. Maintain the same interval or time between taking each dose and first decrease the dose.  Cut the total daily intake of opioids by one tablet each day Next start to increase the time between doses. The last dose that should be eliminated is the evening dose.      TED hose   Complete by: As directed    Use stockings (TED hose) for 2 weeks on both leg(s).  Then for 2 more weeks on the surgical leg.  You may remove them at night for sleeping.        Follow-up Information     Edna Toribio LABOR, MD. Go on 10/03/2024.   Specialty: Orthopedic Surgery Why: Your appointment is scheduled for 2:15. Contact information: [address] [place] [address] [phone]         Davene GLENDALE Lofts. Go on 09/25/2024.   Why: Your outpatient physical therapy has been ordered. they will call you with a time Contact information: [phone]                   Discharge Instructions       INSTRUCTIONS AFTER JOINT REPLACEMENT   Remove items at home which could result in a fall. This includes throw rugs or furniture in walking pathways ICE to the affected joint every three hours while awake for 30 minutes at a time, for at least the first 3-5 days, and then as needed for pain and swelling.  Continue to use ice for pain and swelling. You may notice swelling that will progress down to the foot and ankle.  This is normal after surgery.  Elevate your leg when you are not up walking on it.   Continue to use the breathing machine you got in the hospital (incentive spirometer) which will help keep your temperature down.  It is common for your temperature to cycle up and down following surgery, especially at night when you are not up moving around and exerting yourself.  The breathing machine keeps your lungs expanded and your temperature down.  DIET:  As you were doing prior to hospitalization, we recommend a well-balanced diet.  DRESSING / WOUND CARE / SHOWERING:  Keep the surgical dressing until follow up.  The dressing is water proof, so you can shower without any extra covering.  IF THE DRESSING FALLS OFF or the wound gets wet inside,  change the dressing with sterile gauze.  Please use good hand washing techniques before changing the dressing.  Do not use any lotions or creams on the incision until instructed by your surgeon.    ACTIVITY  Increase activity slowly as tolerated, but follow the weight bearing instructions below.   No driving for 6 weeks or until further direction given by your physician.  You cannot drive while taking narcotics.  No lifting or carrying greater than 10 lbs. until further directed by your surgeon. Avoid periods of inactivity such as sitting longer than an hour when not asleep. This helps prevent blood clots.  You may return to work once you are authorized by your doctor.   WEIGHT BEARING: Weight bearing as tolerated with assist device (walker, cane, etc) as  directed, use it as long as suggested by your surgeon or therapist, typically at least 4-6 weeks.  EXERCISES  Results after joint replacement surgery are often greatly improved when you follow the exercise, range of motion and muscle strengthening exercises prescribed by your doctor. Safety measures are also important to protect the joint from further injury. Any time any of these exercises cause you to have increased pain or swelling, decrease what you are doing until you are comfortable again and then slowly increase them. If you have problems or questions, call your caregiver or physical therapist for advice.   Rehabilitation is important following a joint replacement. After just a few days of immobilization, the muscles of the leg can become weakened and shrink (atrophy).  These exercises are designed to build up the tone and strength of the thigh and leg muscles and to improve motion. Often times heat used for twenty to thirty minutes before working out will loosen up your tissues and help with improving the range of motion but do not use heat for the first two weeks following surgery (sometimes heat can increase post-operative swelling).   These exercises can be done on a training (exercise) mat, on the floor, on a table or on a bed. Use whatever works the best and is most comfortable for you.    Use music or television while you are exercising so that the exercises are a pleasant break in your day. This will make your life better with the exercises acting as a break in your routine that you can look forward to.   Perform all exercises about fifteen times, three times per day or as directed.  You should exercise both the operative leg and the other leg as well.  Exercises include:   Quad Sets - Tighten up the muscle on the front of the thigh (Quad) and hold for 5-10 seconds.   Straight Leg Raises - With your knee straight (if you were given a brace, keep it on), lift the leg to 60 degrees, hold  for 3 seconds, and slowly lower the leg.  Perform this exercise against resistance later as your leg gets stronger.  Leg Slides: Lying on your back, slowly slide your foot toward your buttocks, bending your knee up off the floor (only go as far as is comfortable). Then slowly slide your foot back down until your leg is flat on the floor again.  Angel Wings: Lying on your back spread your legs to the side as far apart as you can without causing discomfort.  Hamstring Strength:  Lying on your back, push your heel against the floor with your leg straight by tightening up the muscles of your buttocks.  Repeat, but this  time bend your knee to a comfortable angle, and push your heel against the floor.  You may put a pillow under the heel to make it more comfortable if necessary.   A rehabilitation program following joint replacement surgery can speed recovery and prevent re-injury in the future due to weakened muscles. Contact your doctor or a physical therapist for more information on knee rehabilitation.   CONSTIPATION:  Constipation is defined medically as fewer than three stools per week and severe constipation as less than one stool per week.  Even if you have a regular bowel pattern at home, your normal regimen is likely to be disrupted due to multiple reasons following surgery.  Combination of anesthesia, postoperative narcotics, change in appetite and fluid intake all can affect your bowels.   YOU MUST use at least one of the following options; they are listed in order of increasing strength to get the job done.  They are all available over the counter, and you may need to use some, POSSIBLY even all of these options:    Drink plenty of fluids (prune juice may be helpful) and high fiber foods Colace 100 mg by mouth twice a day  Senokot for constipation as directed and as needed Dulcolax (bisacodyl), take with full glass of water  Miralax (polyethylene glycol) once or twice a day as needed.  If you  have tried all these things and are unable to have a bowel movement in the first 3-4 days after surgery call either your surgeon or your primary doctor.    If you experience loose stools or diarrhea, hold the medications until you stool forms back up.  If your symptoms do not get better within 1 week or if they get worse, check with your doctor.  If you experience the worst abdominal pain ever or develop nausea or vomiting, please contact the office immediately for further recommendations for treatment.  ITCHING:  If you experience itching with your medications, try taking only a single pain pill, or even half a pain pill at a time.  You can also use Benadryl over the counter for itching or also to help with sleep.   TED HOSE STOCKINGS:  Use stockings on both legs until for at least 2 weeks or as directed by physician office. They may be removed at night for sleeping.  MEDICATIONS:  See your medication summary on the "After Visit Summary" that nursing will review with you.  You may have some home medications which will be placed on hold until you complete the course of blood thinner medication.  It is important for you to complete the blood thinner medication as prescribed.  Blood clot prevention (DVT Prophylaxis): After surgery you are at an increased risk for a blood clot.  You were prescribed a blood thinner, Aspirin 81mg , to be taken twice daily for a total of 4 weeks from surgery to help reduce your risk of getting a blood clot.  Signs of a pulmonary embolus (blood clot in the lungs) include sudden short of breath, feeling lightheaded or dizzy, chest pain with a deep breath, rapid pulse rapid breathing.  Signs of a blood clot in your arms or legs include new unexplained swelling and cramping, warm, red or darkened skin around the painful area.  Please call the office or 911 right away if these signs or symptoms develop.  PRECAUTIONS:   If you experience chest pain or shortness of breath - call  911 immediately for transfer to the hospital emergency department.  If you develop a fever greater that 101 F, purulent drainage from wound, increased redness or drainage from wound, foul odor from the wound/dressing, or calf pain - CONTACT YOUR SURGEON.                                                   FOLLOW-UP APPOINTMENTS:  If you do not already have a post-op appointment, please call the office for an appointment to be seen by your surgeon.  Guidelines for how soon to be seen are listed in your "After Visit Summary", but are typically between 2-3 weeks after surgery.  If you have a specialized bandage, you may be told to follow up 1 week after surgery.  OTHER INSTRUCTIONS:  Knee Replacement:  Do not place pillow under knee, focus on keeping the knee straight while resting.  Place foam block, curve side up under heel at all times except when walking.  DO NOT modify, tear, cut, or change the foam block in any way.  POST-OPERATIVE OPIOID TAPER INSTRUCTIONS: It is important to wean off of your opioid medication as soon as possible. If you do not need pain medication after your surgery it is ok to stop day one. Opioids include: Codeine, Hydrocodone(Norco, Vicodin), Oxycodone (Percocet, oxycontin ) and hydromorphone amongst others.  Long term and even short term use of opiods can cause: Increased pain response Dependence Constipation Depression Respiratory depression And more.  Withdrawal symptoms can include Flu like symptoms Nausea, vomiting And more Techniques to manage these symptoms Hydrate well Eat regular healthy meals Stay active Use relaxation techniques(deep breathing, meditating, yoga) Do Not substitute Alcohol to help with tapering If you have been on opioids for less than two weeks and do not have pain than it is ok to stop all together.  Plan to wean off of opioids This plan should start within one week post op of your joint replacement. Maintain the same interval or time  between taking each dose and first decrease the dose.  Cut the total daily intake of opioids by one tablet each day Next start to increase the time between doses. The last dose that should be eliminated is the evening dose.   MAKE SURE YOU:  Understand these instructions.  Get help right away if you are not doing well or get worse.    Thank you for letting us  be a part of your medical care team.  It is a privilege we respect greatly.  We hope these instructions will help you stay on track for a fast and full recovery!            Signed: Kolter Reaver A Anasophia Pecor 09/21/2024, 6:59 AM

## 2024-09-21 NOTE — Progress Notes (Signed)
 Physical Therapy Treatment Patient Details Name: Alexis Mcneil MRN: [id number] DOB: 1956/07/23 Today's Date: Mcneil   History of Present Illness 68 yo female presents to therapy s/p R TKA on 09/20/2024 due to failure of conservative measures. Mcneil PMH includes but is not limited to: OA, CKD, gout, peripheral neuropathy, OSA, HLD, hypothyroidism, anemia, HTN, DM II, and L RTC tear.    Mcneil Comments  Mcneil in bed, agreeable to Mcneil session. She states that she is able to lift her leg and has 2/10 pain with movement and WB, no pain at rest. Mcneil able to come to sit edge of bed with good effect, able to maintain sitting balance, completes Sit to Stand with RW at SBA. Mcneil completes weight shifting with some RLE instability with full weight bearing, educated on use of BUE when standing on RLE. Mcneil able to amb x163ft with CGA fading to SBA. Uses restroom and returns to chair. Resting comfortably, no further needs. Nursing tech present for vitals.     If plan is discharge home, recommend the following: A little help with walking and/or transfers;A little help with bathing/dressing/bathroom;Assistance with cooking/housework;Assist for transportation;Help with stairs or ramp for entrance   Can travel by private vehicle        Equipment Recommendations  None recommended by Mcneil    Recommendations for Other Services       Precautions / Restrictions Precautions Precautions: Fall;Knee Recall of Precautions/Restrictions: Intact Restrictions Weight Bearing Restrictions Per Provider Order: Yes RLE Weight Bearing Per Provider Order: Weight bearing as tolerated     Mobility  Bed Mobility Overal bed mobility: Modified Independent                  Transfers Overall transfer level: Needs assistance Equipment used: Rolling walker (2 wheels) Transfers: Sit to/from Stand Sit to Stand: Supervision                Ambulation/Gait Ambulation/Gait assistance: Contact guard assist Gait Distance  (Feet): 120 Feet Assistive device: Rolling walker (2 wheels) Gait Pattern/deviations: Step-through pattern, Decreased step length - left, Decreased stance time - right, Antalgic Gait velocity: dec     General Gait Details: Mcneil amb with progressing tolerance to WB demonstrating a step to pattern with discontinuity of steps and improves to small stride, reciprocal pattern, asymmetric   Stairs             Wheelchair Mobility     Tilt Bed    Modified Rankin (Stroke Patients Only)       Balance Overall balance assessment: Needs assistance Sitting-balance support: No upper extremity supported, Feet supported Sitting balance-Leahy Scale: Normal     Standing balance support: Single extremity supported, Reliant on assistive device for balance Standing balance-Leahy Scale: Fair                              Hotel Manager: No apparent difficulties  Cognition Arousal: Alert Behavior During Therapy: WFL for tasks assessed/performed   Mcneil - Cognitive impairments: No apparent impairments                         Following commands: Intact      Cueing    Exercises      General Comments        Pertinent Vitals/Pain Pain Assessment Pain Assessment: 0-10 Pain Score: 2  Pain Location: R knee and LE Pain Descriptors / Indicators: Aching,  Discomfort, Dull, Operative site guarding Pain Intervention(s): Limited activity within patient's tolerance, Monitored during session, Repositioned    Home Living                          Prior Function            Mcneil Goals (current goals can now be found in the care plan section) Acute Rehab Mcneil Goals Patient Stated Goal: to be able to get back to walking and site seeing Mcneil Goal Formulation: With patient Time For Goal Achievement: 10/04/24 Potential to Achieve Goals: Good Progress towards Mcneil goals: Progressing toward goals    Frequency    7X/week      Mcneil Plan       Co-evaluation              AM-PAC Mcneil 6 Clicks Mobility   Outcome Measure  Help needed turning from your back to your side while in a flat bed without using bedrails?: None Help needed moving from lying on your back to sitting on the side of a flat bed without using bedrails?: A Little Help needed moving to and from a bed to a chair (including a wheelchair)?: A Little Help needed standing up from a chair using your arms (e.g., wheelchair or bedside chair)?: A Little Help needed to walk in hospital room?: A Little Help needed climbing 3-5 steps with a railing? : A Little 6 Click Score: 19    End of Session Equipment Utilized During Treatment: Gait belt Activity Tolerance: Patient tolerated treatment well Patient left: in chair;with chair alarm set;with nursing/sitter in room;with call bell/phone within reach Nurse Communication: Mobility status Mcneil Visit Diagnosis: Unsteadiness on feet (R26.81);Other abnormalities of gait and mobility (R26.89);Muscle weakness (generalized) (M62.81);Difficulty in walking, not elsewhere classified (R26.2);Pain Pain - Right/Left: Right Pain - part of body: Leg;Knee     Time: 9049-8984 Mcneil Time Calculation (min) (ACUTE ONLY): 25 min  Charges:    $Gait Training: 23-37 mins Mcneil General Charges $$ ACUTE Mcneil VISIT: 1 Visit                     Alexis Mcneil Acute Rehabilitation Services Office: [phone] Mcneil    Alexis Mcneil, 10:30 AM

## 2024-09-21 NOTE — TOC Transition Note (Signed)
 Transition of Care [facility]) - Discharge Note   Patient Details  Name: Alexis Mcneil MRN: [id number] Date of Birth: 03-19-1956  Transition of Care [facility]) CM/SW Contact:  Alfonse JONELLE Rex, RN Phone Number: 09/21/2024, 11:10 AM   Clinical Narrative:   Met with patient at bedside to review dc therapy and home equipment needs, patient confirmed OPPT-[facility], has RW. No INPT CM needs.     Final next level of care: OP Rehab Barriers to Discharge: No Barriers Identified   Patient Goals and CMS Choice Patient states their goals for this hospitalization and ongoing recovery are:: return home          Discharge Placement                       Discharge Plan and Services Additional resources added to the After Visit Summary for                                       Social Drivers of Health (SDOH) Interventions SDOH Screenings   Food Insecurity: No Food Insecurity (09/20/2024)  Housing: Low Risk  (09/20/2024)  Transportation Needs: No Transportation Needs (09/20/2024)  Utilities: Not At Risk (09/20/2024)  Alcohol Screen: Low Risk  (07/01/2024)  Depression (PHQ2-9): High Risk (08/07/2024)  Financial Resource Strain: Low Risk  (07/01/2024)  Physical Activity: Insufficiently Active (07/01/2024)  Social Connections: Socially Isolated (09/20/2024)  Stress: No Stress Concern Present (07/01/2024)  Tobacco Use: Low Risk  (09/20/2024)  Health Literacy: Adequate Health Literacy (08/04/2023)     Readmission Risk Interventions     No data to display

## 2024-09-21 NOTE — Progress Notes (Signed)
     Subjective:  Patient reports pain as mild.  Doing better this morning.  Able to do straight leg raise without difficulty.  Discussed plan for mobilization with therapy today.  Anticipate discharge home later today.  Yesterday's total administered Morphine Milligram Equivalents: 75   Objective:   VITALS:   Vitals:   09/20/24 1723 09/20/24 2236 09/21/24 0148 09/21/24 0601  BP: 128/77 125/67 131/66 121/67  Pulse: 72 67 66 66  Resp: 16 17 15 16   Temp: 97.7 F (36.5 C)  (!) 97.4 F (36.3 C) 98.1 F (36.7 C)  TempSrc: Oral  Oral Oral  SpO2: 96% 96% 98% 97%  Weight:      Height:        Sensation intact distally Intact pulses distally Dorsiflexion/Plantar flexion intact Incision: dressing C/D/I Compartment soft    Lab Results  Component Value Date   WBC 8.2 09/21/2024   HGB 9.3 (L) 09/21/2024   HCT 27.4 (L) 09/21/2024   MCV 85.4 09/21/2024   PLT 226 09/21/2024   BMET    Component Value Date/Time   NA 137 09/21/2024 0357   NA 135 08/07/2024 1619   K 3.8 09/21/2024 0357   CL 100 09/21/2024 0357   CO2 28 09/21/2024 0357   GLUCOSE 137 (H) 09/21/2024 0357   BUN 20 09/21/2024 0357   BUN 22 08/07/2024 1619   CREATININE 0.77 09/21/2024 0357   CREATININE 1.21 (H) 10/22/2022 0905   CALCIUM  9.6 09/21/2024 0357   EGFR 55 (L) 08/07/2024 1619   GFRNONAA >60 09/21/2024 0357   GFRNONAA 42 (L) 01/10/2015 1620      Xray: Total knee arthroplasty components in good position there is future  Assessment/Plan: 1 Day Post-Op   Principal Problem:   Localized osteoarthritis of right knee  Status post left knee arthroplasty 09/20/2024  Post op recs: WB: WBAT Abx: ancef  Imaging: PACU xrays DVT prophylaxis: Aspirin 81mg  BID x4 weeks Follow up: 2 weeks after surgery for a wound check with Dr. Edna at [facility].  Address: [address], [place], [place] [address]  Office Phone: [phone]   TORIBIO DELENA EDNA 09/21/2024, 6:57  AM   Toribio Edna, MD  Contact information:   [phone] 7am-5pm epic message Dr. Edna, or call office for patient follow up: [phone] After hours and holidays please check [email or web address] for group call information for [facility]

## 2024-09-22 ENCOUNTER — Ambulatory Visit: Admitting: Rehabilitative and Restorative Service Providers"

## 2024-09-22 ENCOUNTER — Other Ambulatory Visit: Payer: Self-pay

## 2024-09-22 ENCOUNTER — Encounter: Payer: Self-pay | Admitting: Rehabilitative and Restorative Service Providers"

## 2024-09-22 DIAGNOSIS — M6281 Muscle weakness (generalized): Secondary | ICD-10-CM | POA: Diagnosis not present

## 2024-09-22 DIAGNOSIS — R2689 Other abnormalities of gait and mobility: Secondary | ICD-10-CM

## 2024-09-22 DIAGNOSIS — G8929 Other chronic pain: Secondary | ICD-10-CM | POA: Diagnosis not present

## 2024-09-22 DIAGNOSIS — M25562 Pain in left knee: Secondary | ICD-10-CM | POA: Diagnosis not present

## 2024-09-22 DIAGNOSIS — M25561 Pain in right knee: Secondary | ICD-10-CM | POA: Diagnosis not present

## 2024-09-22 NOTE — Therapy (Signed)
 OUTPATIENT PHYSICAL THERAPY LOWER EXTREMITY EVALUATION  Patient Name: HANAH MOULTRY MRN: [id number] DOB:02/01/1956, 68 y.o., female Today's Date: 09/22/2024  END OF SESSION:  PT End of Session - 09/22/24 1110     Visit Number 1    Number of Visits 16    Date for Recertification  11/21/24    Authorization Type Medicare Part A and B    PT Start Time 1108    PT Stop Time 1150    PT Time Calculation (min) 42 min    Activity Tolerance Patient tolerated treatment well    Behavior During Therapy [facility] for tasks assessed/performed          Past Medical History:  Diagnosis Date   Anemia    Arthritis    KNEE,SHOULDERS   Cataract January 2021   Eye appt visit   Diabetes mellitus without complication (HCC)    GERD (gastroesophageal reflux disease)    Heart murmur    Hyperlipidemia    Hypertension    IFG (impaired fasting glucose)    Neuromuscular disorder (HCC)    NEUROPATHY FEET   OSA (obstructive sleep apnea)    CPAP   Sleep apnea    CPAP   Thyroid  disease    Past Surgical History:  Procedure Laterality Date   COLONOSCOPY     endometrial polyp     January 2014   EYE SURGERY  1988   Cyst on eye removed   mouth palate surgery repair  1961   large tear   ROOT CANEL     2 WEEKS AGO   rt knee surgery arthroscopy  2009   torn meniscus/medial   torn ligament shoulder  1975   repaired   TOTAL KNEE ARTHROPLASTY Right 09/20/2024   Procedure: ARTHROPLASTY, KNEE, TOTAL;  Surgeon: Edna Toribio LABOR, MD;  Location: [facility];  Service: Orthopedics;  Laterality: Right;   Patient Active Problem List   Diagnosis Date Noted   Localized osteoarthritis of right knee 09/20/2024   B12 deficiency 08/17/2024   Primary osteoarthritis of right knee 12/04/2021   CKD (chronic kidney disease) stage 3, GFR 30-59 ml/min (HCC) 11/12/2021   Pre-ulcerative corn or callous 05/20/2020   Excessive sweating 05/20/2020   Gout 03/07/2018   Hereditary and idiopathic peripheral neuropathy  09/24/2015   Trochanteric bursitis of both hips 10/08/2014   Lumbar spinal stenosis 09/06/2014   Right knee pain 09/06/2014   Obstructive sleep apnea 09/26/2010   Left rotator cuff tear 03/18/2010   PARESTHESIA, HANDS 03/18/2010   Diabetes mellitus type 2 in obese 02/07/2008   Hypothyroidism 01/06/2007   Hyperlipidemia 01/06/2007   ANEMIA NOS 01/06/2007   HYPERTENSION, BENIGN ESSENTIAL 01/06/2007    PCP: Dorothyann Byars, MD  REFERRING PROVIDER: Toribio Edna, MD  REFERRING DIAG:  Diagnosis  [phone] (ICD-10-CM) - Presence of right artificial knee joint    THERAPY DIAG:  Muscle weakness (generalized)  Acute pain of right knee  Other abnormalities of gait and mobility  Rationale for Evaluation and Treatment: Rehabilitation  ONSET DATE: 09/20/2024  SUBJECTIVE:   SUBJECTIVE STATEMENT: The patient reports nerve block wore off yesterday. She c/o tightness sensation in stitching. She is using RW at home.   PERTINENT HISTORY: Diabetes Neuropathy in feet  Paresthesia hands HTN PAIN:  Are you having pain? Yes: NPRS scale: 5-6/10 Pain location: R knee Pain description: soreness Aggravating factors: weight bearing Relieving factors: rest  PRECAUTIONS: Fall  WEIGHT BEARING RESTRICTIONS: Yes WBAT  FALLS:  Has patient fallen in last 6 months? Yes.  Number of falls knees gave way, and then neuropathy-- couldn't baance  LIVING ENVIRONMENT: Lives with: lives with friend Lives in: House/apartment Stairs: Yes: External: 1 steps; can use walker Has following equipment at home: Walker - 2 wheeled, BSC over toilet, shower chair  OCCUPATION: retired  PLOF: Independent  PATIENT GOALS: pain free walking, being able to travel without pain  NEXT MD VISIT: 10/02/24  OBJECTIVE:  Note: Objective measures were completed at Evaluation unless otherwise noted.  PATIENT SURVEYS:  LEFS =15%  COGNITION: Overall cognitive status: Within functional limits for tasks  assessed     SENSATION: Numbness in bilat feet  EDEMA:  Circumferential: 41cm R and 39 cm L  POSTURE: rounded shoulders  PALPATION: Edema noted in R LE   LOWER EXTREMITY ROM: Passive ROM Right eval Left eval  Hip flexion    Hip extension    Hip abduction    Hip adduction    Hip internal rotation    Hip external rotation    Knee flexion    Knee extension -10 Quad set with towel under foot  78 deg  AROM to 64 supine heel slide  Ankle dorsiflexion    Ankle plantarflexion    Ankle inversion    Ankle eversion     (Blank rows = not tested)  LOWER EXTREMITY MMT: NT due to pain today/ post surgical MMT Right eval Left eval  Hip flexion    Hip extension    Hip abduction    Hip adduction    Hip internal rotation    Hip external rotation    Knee flexion    Knee extension <3/5 --unable to lift fully against gravity   Ankle dorsiflexion    Ankle plantarflexion    Ankle inversion    Ankle eversion     (Blank rows = not tested)  FUNCTIONAL TESTS:  Sit<>stand x 2 reps with UE support slowed pace  GAIT: Distance walked: x 75 feet Assistive device utilized: Walker - 2 wheeled Level of assistance: SBA Comments: step to pattern Stair negotiation step to pattern with SBA x 2 steps    [facility] Adult PT Treatment:                                                DATE: 09/22/24 Therapeutic Exercise: See HEP below Modalities: Vasopneumatic device x 10 minutes 34 deg  PATIENT EDUCATION:  Education details: HEP, plan of care Person educated: Patient Education method: Explanation, Demonstration, and Handouts Education comprehension: verbalized understanding, returned demonstration, and needs further education  HOME EXERCISE PROGRAM: Access Code: [email or web address] URL: [email or web address] Date: 09/22/2024 Prepared by: Tawni Ferrier  Exercises - Supine Quad Set  - 3 x daily - 7 x weekly - 1 sets - 10 reps - 10 hold - Supine Heel Slide with Strap  - 3 x daily  - 7 x weekly - 1 sets - 10 reps - 10 hold - Supine Hip Abduction  - 2 x daily - 7 x weekly - 1 sets - 10 reps - Seated Long Arc Quad  - 3 x daily - 7 x weekly - 1 sets - 10 reps - Seated Heel Slide  - 2 x daily - 7 x weekly - 1 sets - 10 reps - Seated Ankle Pumps  - 2 x daily - 7 x weekly - 1 sets - 10 reps  ASSESSMENT:  CLINICAL IMPRESSION: Patient is a 68 y.o. female who was seen today for physical therapy evaluation and treatment for s/p R TKR. She presents with impairments in AROM, muscle strength, gait mechanics, transfers, and pain. PT to address deficits with goals to return to community ambulation mod indep.    OBJECTIVE IMPAIRMENTS: Abnormal gait, decreased activity tolerance, decreased balance, decreased mobility, decreased ROM, decreased strength, increased fascial restrictions, impaired flexibility, impaired tone, and pain.   ACTIVITY LIMITATIONS: carrying, lifting, bending, squatting, sleeping, stairs, and locomotion level  PARTICIPATION LIMITATIONS: community activity  PERSONAL FACTORS: 3+ comorbidities: HTN, diabetes, neuropathy are also affecting patient's functional outcome.   REHAB POTENTIAL: Good  CLINICAL DECISION MAKING: Evolving/moderate complexity  EVALUATION COMPLEXITY: Moderate  GOALS: Goals reviewed with patient? Yes  SHORT TERM GOALS: Target date: 10/21/24  The patient will be indep with initial HEP Baseline: initiated at eval Goal status: INITIAL  2.  The patient will improve PROM to 100 degrees R knee flexion  Baseline: see above Goal status: INITIAL  3.  The patient will improve quad set knee extension (towel under heel) to -5 degrees from full extension. Baseline:  see above Goal status: INITIAL  LONG TERM GOALS: Target date: 11/20/2024   The patient will be indep with progression of HEP. Baseline:  initiated at eval Goal status: INITIAL  2.  The patient will improve LEFS by 30%  to demonstrate improved functional abilities. Baseline: see  above Goal status: INITIAL  3.  The patient will negotiate 4 steps with reciprocal pattern mod indep. Baseline:  step to pattern Goal status: INITIAL  4.  The patient will imrpove AROM R knee to 110 degrees flexion. Baseline:  see above Goal status: INITIAL  5.  The patient will improve AROM R knee to full extension. Baseline:  see above Goal status: INITIAL  6.  The patient will ambulate without device mod indep. Baseline:  RW Goal status: INITIAL  PLAN:  PT FREQUENCY: 2x/week  PT DURATION: 8 weeks  PLANNED INTERVENTIONS: 97164- PT Re-evaluation, 97750- Physical Performance Testing, 97110-Therapeutic exercises, 97530- Therapeutic activity, [id number]- Neuromuscular re-education, 97535- Self Care, 02859- Manual therapy, [id number]- Gait training, [id number]- Aquatic Therapy, [phone]- Electrical stimulation (unattended), 97016- Vasopneumatic device, 20560 (1-2 muscles), 20561 (3+ muscles)- Dry Needling, Patient/Family education, Balance training, Stair training, Taping, Joint mobilization, Cryotherapy, and Moist heat  PLAN FOR NEXT SESSION: progress HEP to patient tolerance.   Audriella Blakeley, PT 09/22/2024, 11:12 AM

## 2024-09-26 ENCOUNTER — Ambulatory Visit (INDEPENDENT_AMBULATORY_CARE_PROVIDER_SITE_OTHER)

## 2024-09-26 ENCOUNTER — Ambulatory Visit: Admitting: Rehabilitative and Restorative Service Providers"

## 2024-09-26 ENCOUNTER — Encounter: Payer: Self-pay | Admitting: Rehabilitative and Restorative Service Providers"

## 2024-09-26 VITALS — BP 121/55 | HR 88 | Ht 65.0 in | Wt 191.0 lb

## 2024-09-26 DIAGNOSIS — R2689 Other abnormalities of gait and mobility: Secondary | ICD-10-CM

## 2024-09-26 DIAGNOSIS — M6281 Muscle weakness (generalized): Secondary | ICD-10-CM | POA: Diagnosis not present

## 2024-09-26 DIAGNOSIS — M25561 Pain in right knee: Secondary | ICD-10-CM | POA: Diagnosis not present

## 2024-09-26 DIAGNOSIS — M25562 Pain in left knee: Secondary | ICD-10-CM | POA: Diagnosis not present

## 2024-09-26 DIAGNOSIS — G8929 Other chronic pain: Secondary | ICD-10-CM | POA: Diagnosis not present

## 2024-09-26 DIAGNOSIS — Z0000 Encounter for general adult medical examination without abnormal findings: Secondary | ICD-10-CM

## 2024-09-26 NOTE — Progress Notes (Signed)
 Chief Complaint  Patient presents with   Medicare Wellness     Subjective:   Alexis Mcneil is a 68 y.o. female who presents for a Medicare Annual Wellness Visit.  Allergies (verified) Gabapentin , Niacin, and Niacin and related   History: Past Medical History:  Diagnosis Date   Anemia    Arthritis    KNEE,SHOULDERS   Cataract January 2021   Eye appt visit   Diabetes mellitus without complication (HCC)    GERD (gastroesophageal reflux disease)    Heart murmur    Hyperlipidemia    Hypertension    IFG (impaired fasting glucose)    Neuromuscular disorder (HCC)    NEUROPATHY FEET   OSA (obstructive sleep apnea)    CPAP   Sleep apnea    CPAP   Thyroid  disease    Past Surgical History:  Procedure Laterality Date   COLONOSCOPY     endometrial polyp     January 2014   EYE SURGERY  1988   Cyst on eye removed   mouth palate surgery repair  1961   large tear   ROOT CANEL     2 WEEKS AGO   rt knee surgery arthroscopy  2009   torn meniscus/medial   torn ligament shoulder  1975   repaired   TOTAL KNEE ARTHROPLASTY Right 09/20/2024   Procedure: ARTHROPLASTY, KNEE, TOTAL;  Surgeon: Edna Toribio LABOR, MD;  Location: [facility];  Service: Orthopedics;  Laterality: Right;   Family History  Problem Relation Age of Onset   Hyperlipidemia Mother        Living   Hypertension Mother    Arthritis Mother    Colon cancer Father 71       colon/ polyps   Heart attack Father    Stroke Father    Cancer Father    Diabetes Father    Supraventricular tachycardia Sister    Cancer Paternal Aunt    Cancer Paternal Uncle    Hyperlipidemia Maternal Grandmother    Varicose Veins Maternal Grandmother    Colon cancer Paternal Grandmother    Stroke Paternal Grandmother    Heart attack Paternal Grandmother        heart attack   Cancer Paternal Grandmother    Diabetes Paternal Grandmother    Diabetes Other        grandmother   Esophageal cancer Neg Hx    Stomach cancer Neg Hx     Rectal cancer Neg Hx    Breast cancer Neg Hx    Social History   Occupational History   Occupation: Retired.  Tobacco Use   Smoking status: Never   Smokeless tobacco: Never  Vaping Use   Vaping status: Never Used  Substance and Sexual Activity   Alcohol use: Not Currently    Alcohol/week: 2.0 standard drinks of alcohol    Types: 2 Cans of beer per week    Comment: occasional beer on the weekends.     Drug use: No   Sexual activity: Not Currently    Birth control/protection: Post-menopausal, None   Tobacco Counseling Counseling given: Not Answered  SDOH Screenings   Food Insecurity: No Food Insecurity (09/26/2024)  Housing: Low Risk  (09/26/2024)  Transportation Needs: No Transportation Needs (09/26/2024)  Utilities: Not At Risk (09/26/2024)  Alcohol Screen: Low Risk  (09/22/2024)  Depression (PHQ2-9): Low Risk  (09/26/2024)  Recent Concern: Depression (PHQ2-9) - High Risk (08/07/2024)  Financial Resource Strain: Low Risk  (09/22/2024)  Physical Activity: Inactive (09/26/2024)  Social Connections:  Moderately Isolated (09/26/2024)  Stress: No Stress Concern Present (09/26/2024)  Tobacco Use: Low Risk  (09/26/2024)  Health Literacy: Adequate Health Literacy (09/26/2024)   See flowsheets for full screening details  Depression Screen PHQ 2 & 9 Depression Scale- Over the past 2 weeks, how often have you been bothered by any of the following problems? Little interest or pleasure in doing things: 0 Feeling down, depressed, or hopeless (PHQ Adolescent also includes...irritable): 0 PHQ-2 Total Score: 0 Trouble falling or staying asleep, or sleeping too much: 2 Feeling tired or having little energy: 3 Poor appetite or overeating (PHQ Adolescent also includes...weight loss): 2 Feeling bad about yourself - or that you are a failure or have let yourself or your family down: 1 Trouble concentrating on things, such as reading the newspaper or watching television (PHQ Adolescent also  includes...like school work): 1 Moving or speaking so slowly that other people could have noticed. Or the opposite - being so fidgety or restless that you have been moving around a lot more than usual: 2 Thoughts that you would be better off dead, or of hurting yourself in some way: 0 PHQ-9 Total Score: 15 If you checked off any problems, how difficult have these problems made it for you to do your work, take care of things at home, or get along with other people?: Not difficult at all  Depression Treatment Depression Interventions/Treatment : Counseling     Goals Addressed             This Visit's Progress    Patient Stated       Patient states she would like to be able to walk on the beach. She just had knee replacement.        Visit info / Clinical Intake: Medicare Wellness Visit Type:: Subsequent Annual Wellness Visit Persons participating in visit:: patient Medicare Wellness Visit Mode:: In-person (required for WTM) Information given by:: patient Interpreter Needed?: No Pre-visit prep was completed: yes AWV questionnaire completed by patient prior to visit?: yes Date:: 09/22/24 Living arrangements:: with family/others Patient's Overall Health Status Rating: very good Typical amount of pain: none Does pain affect daily life?: no Are you currently prescribed opioids?: (!) yes  Dietary Habits and Nutritional Risks How many meals a day?: 3 Eats fruit and vegetables daily?: yes Most meals are obtained by: preparing own meals In the last 2 weeks, have you had any of the following?: none Diabetic:: (!) yes Any non-healing wounds?: no How often do you check your BS?: 1 Would you like to be referred to a Nutritionist or for Diabetic Management? : no  Functional Status Activities of Daily Living (to include ambulation/medication): Independent Ambulation: Independent with device- listed below Home Assistive Devices/Equipment: Walker (specify Type) (right knee  replacement) Medication Administration: Independent Home Management: Independent Manage your own finances?: yes Primary transportation is: driving Concerns about vision?: no *vision screening is required for WTM* Concerns about hearing?: no  Fall Screening Falls in the past year?: 1 Number of falls in past year: 1 Was there an injury with Fall?: 0 Fall Risk Category Calculator: 2 Patient Fall Risk Level: Moderate Fall Risk  Fall Risk Patient at Risk for Falls Due to: Impaired mobility Fall risk Follow up: Falls evaluation completed  Home and Transportation Safety: All rugs have non-skid backing?: yes All stairs or steps have railings?: (!) no Grab bars in the bathtub or shower?: yes Have non-skid surface in bathtub or shower?: yes Good home lighting?: yes Regular seat belt use?: yes Hospital  stays in the last year:: (!) yes (right knee replacement) How many hospital stays:: 1 Reason: right knee replacement  Cognitive Assessment Difficulty concentrating, remembering, or making decisions? : no Will 6CIT or Mini Cog be Completed: yes What year is it?: 0 points What month is it?: 0 points Give patient an address phrase to remember (5 components): [address] [place], [place] [address] About what time is it?: 0 points Count backwards from 20 to 1: 0 points Say the months of the year in reverse: 0 points Repeat the address phrase from earlier: 0 points 6 CIT Score: 0 points  Advance Directives (For Healthcare) Does Patient Have a Medical Advance Directive?: Yes Does patient want to make changes to medical advance directive?: No - Guardian declined Type of Advance Directive: Healthcare Power of [employer] of Healthcare Power of Attorney in Chart?: No - copy requested Would patient like information on creating a medical advance directive?: No - Patient declined  Reviewed/Updated  Reviewed/Updated: Medical History; Family History; Medications; Allergies; Care Teams; Patient  Goals        Objective:    Today's Vitals   09/26/24 1259  BP: (!) 121/55  Pulse: 88  SpO2: 97%  Weight: 191 lb (86.6 kg)  Height: 5' 5 (1.651 m)   Body mass index is 31.78 kg/m.  Current Medications (verified) Outpatient Encounter Medications as of 09/26/2024  Medication Sig   acetaminophen  (TYLENOL ) 500 MG tablet Take 2 tablets (1,000 mg total) by mouth every 8 (eight) hours as needed.   allopurinol  (ZYLOPRIM ) 300 MG tablet Take 0.5 tablets (150 mg total) by mouth every evening.   AMBULATORY NON FORMULARY MEDICATION Medication Name: CPAP machine and supplies with humidifier.  Please set her CPAP to 9 cm water pressure. Then download after one week so can review her apneas on 9 cm water pressure.  [facility]. Dx. OSA G47.33   aspirin EC 81 MG tablet Take 1 tablet (81 mg total) by mouth 2 (two) times daily for 28 days. Swallow whole.   atorvastatin  (LIPITOR) 40 MG tablet TAKE 1 TABLET BY MOUTH IN THE  EVENING AT 6 PM   Blood Glucose Monitoring Suppl (FREESTYLE LITE) DEVI    chlorhexidine (HIBICLENS) 4 % external liquid Apply 15 mLs (1 Application total) topically as directed for 30 doses. Use as directed daily for 5 days every other week for 6 weeks.   chlorthalidone  (HYGROTON ) 25 MG tablet Take 1 tablet (25 mg total) by mouth daily. (Patient taking differently: Take 6.25 mg by mouth daily.)   DULoxetine  (CYMBALTA ) 60 MG capsule TAKE 1 CAPSULE BY MOUTH DAILY   gabapentin  (NEURONTIN ) 100 MG capsule Take 1 capsule (100 mg total) by mouth 3 (three) times daily.   glucose blood test strip Freestyle Lite test strips.  FOR CHECKING BLOOD SUGAR ONCE DAILY   Lancets Misc. (ACCU-CHEK FASTCLIX LANCET) KIT Use to check blood sugar once daily. Dx code: E11.69   levothyroxine  (SYNTHROID ) 137 MCG tablet Take 1 tablet (137 mcg total) by mouth daily before breakfast.   losartan  (COZAAR ) 100 MG tablet TAKE 1 TABLET BY MOUTH ONCE  DAILY   MAGNESIUM  OXIDE PO Take 400 mg by mouth in the morning.    meloxicam  (MOBIC ) 15 MG tablet Take 1 tablet (15 mg total) by mouth daily for 15 days.   methocarbamol (ROBAXIN) 500 MG tablet Take 1 tablet (500 mg total) by mouth every 8 (eight) hours as needed for up to 10 days for muscle spasms.   mupirocin ointment (BACTROBAN)  2 % Place 1 Application into the nose 2 (two) times daily for 60 doses. Use as directed 2 times daily for 5 days every other week for 6 weeks.   ondansetron  (ZOFRAN ) 4 MG tablet Take 1 tablet (4 mg total) by mouth every 8 (eight) hours as needed for up to 14 days for nausea or vomiting.   oxyCODONE  (ROXICODONE ) 5 MG immediate release tablet Take 1 tablet (5 mg total) by mouth every 4 (four) hours as needed for up to 7 days for severe pain (pain score 7-10) or moderate pain (pain score 4-6).   pantoprazole  (PROTONIX ) 40 MG tablet TAKE 1 TABLET BY MOUTH DAILY   polyethylene glycol (MIRALAX) 17 g packet Take 17 g by mouth daily.   potassium chloride  SA (KLOR-CON  M) 20 MEQ tablet Take 2 tablets (40 mEq total) by mouth daily. (Patient taking differently: Take 40 mEq by mouth 2 (two) times daily.)   Pyridoxine HCl (B-6 PO) Take 1 tablet by mouth in the morning.   Semaglutide , 2 MG/DOSE, 8 MG/3ML SOPN Inject 2 mg as directed once a week. (Patient not taking: Reported on 09/26/2024)   Facility-Administered Encounter Medications as of 09/26/2024  Medication   cyanocobalamin (VITAMIN B12) injection 1,000 mcg   Hearing/Vision screen No results found. Immunizations and Health Maintenance Health Maintenance  Topic Date Due   COVID-19 Vaccine (4 - 2025-26 season) 07/10/2024   FOOT EXAM  11/07/2024   HEMOGLOBIN A1C  03/11/2025   OPHTHALMOLOGY EXAM  04/10/2025   DTaP/Tdap/Td (3 - Td or Tdap) 05/26/2025   Diabetic kidney evaluation - Urine ACR  08/07/2025   Diabetic kidney evaluation - eGFR measurement  09/21/2025   Medicare Annual Wellness (AWV)  09/26/2025   Mammogram  10/17/2025   Colonoscopy  05/10/2027   DEXA SCAN  07/05/2029    Pneumococcal Vaccine: 50+ Years  Completed   Influenza Vaccine  Completed   Hepatitis C Screening  Completed   Zoster Vaccines- Shingrix   Completed   Meningococcal B Vaccine  Aged Out        Assessment/Plan:  This is a routine wellness examination for Alexis Mcneil.  Patient Care Team: [name], MD as PCP - General Edna Toribio LABOR, MD as Consulting Physician (Orthopedic Surgery) Mai Lynwood FALCON, MD as Consulting Physician (Rheumatology)  I have personally reviewed and noted the following in the patient's chart:   Medical and social history Use of alcohol, tobacco or illicit drugs  Current medications and supplements including opioid prescriptions. Functional ability and status Nutritional status Physical activity Advanced directives List of other physicians Hospitalizations, surgeries, and ER visits in previous 12 months Vitals Screenings to include cognitive, depression, and falls Referrals and appointments  No orders of the defined types were placed in this encounter.  In addition, I have reviewed and discussed with patient certain preventive protocols, quality metrics, and best practice recommendations. A written personalized care plan for preventive services as well as general preventive health recommendations were provided to patient.   Bonny Jon Mayor, CMA   09/26/2024   Return in 1 year (on 09/26/2025).  After Visit Summary: (In Person-Declined) Patient declined AVS at this time.  Nurse Notes:   Alexis Mcneil is a 68 y.o. female patient of Metheney, Dorothyann BIRCH, MD who had a Medicare Annual Wellness Visit today. Alexis Mcneil is Retired and lives alone. She does not have any children. She reports that she is socially active and does interact with friends/family regularly. She is moderately physically active and enjoys yard work.

## 2024-09-26 NOTE — Patient Instructions (Signed)
 Ms. Gunnoe,  Thank you for taking the time for your Medicare Wellness Visit. I appreciate your continued commitment to your health goals. Please review the care plan we discussed, and feel free to reach out if I can assist you further.  Please note that Annual Wellness Visits do not include a physical exam. Some assessments may be limited, especially if the visit was conducted virtually. If needed, we may recommend an in-person follow-up with your provider.  Ongoing Care Seeing your primary care provider every 3 to 6 months helps us  monitor your health and provide consistent, personalized care.   Referrals If a referral was made during today's visit and you haven't received any updates within two weeks, please contact the referred provider directly to check on the status.  Recommended Screenings:  Health Maintenance  Topic Date Due   COVID-19 Vaccine (4 - 2025-26 season) 07/10/2024   Medicare Annual Wellness Visit  08/03/2024   Complete foot exam   11/07/2024   Hemoglobin A1C  03/11/2025   Eye exam for diabetics  04/10/2025   DTaP/Tdap/Td vaccine (3 - Td or Tdap) 05/26/2025   Yearly kidney health urinalysis for diabetes  08/07/2025   Yearly kidney function blood test for diabetes  09/21/2025   Breast Cancer Screening  10/17/2025   Colon Cancer Screening  05/10/2027   DEXA scan (bone density measurement)  07/05/2029   Pneumococcal Vaccine for age over 27  Completed   Flu Shot  Completed   Hepatitis C Screening  Completed   Zoster (Shingles) Vaccine  Completed   Meningitis B Vaccine  Aged Out       09/26/2024    1:12 PM  Advanced Directives  Does Patient Have a Medical Advance Directive? Yes  Type of Advance Directive Healthcare Power of Attorney  Does patient want to make changes to medical advance directive? No - Guardian declined  Copy of Healthcare Power of Attorney in Chart? No - copy requested  Would patient like information on creating a medical advance directive? No -  Patient declined    Vision: Annual vision screenings are recommended for early detection of glaucoma, cataracts, and diabetic retinopathy. These exams can also reveal signs of chronic conditions such as diabetes and high blood pressure.  Dental: Annual dental screenings help detect early signs of oral cancer, gum disease, and other conditions linked to overall health, including heart disease and diabetes.  Please see the attached documents for additional preventive care recommendations.

## 2024-09-26 NOTE — Therapy (Signed)
 OUTPATIENT PHYSICAL THERAPY LOWER EXTREMITY TREATMENT  Patient Name: Alexis Mcneil MRN: [id number] DOB:12-15-1955, 68 y.o., female Today's Date: 09/26/2024  END OF SESSION:  PT End of Session - 09/26/24 1447     Visit Number 2    Number of Visits 16    Date for Recertification  11/21/24    Authorization Type Medicare Part A and B    PT Start Time 1447    PT Stop Time 1530    PT Time Calculation (min) 43 min    Activity Tolerance Patient tolerated treatment well    Behavior During Therapy [facility] for tasks assessed/performed          Past Medical History:  Diagnosis Date   Anemia    Arthritis    KNEE,SHOULDERS   Cataract January 2021   Eye appt visit   Diabetes mellitus without complication (HCC)    GERD (gastroesophageal reflux disease)    Heart murmur    Hyperlipidemia    Hypertension    IFG (impaired fasting glucose)    Neuromuscular disorder (HCC)    NEUROPATHY FEET   OSA (obstructive sleep apnea)    CPAP   Sleep apnea    CPAP   Thyroid  disease    Past Surgical History:  Procedure Laterality Date   COLONOSCOPY     endometrial polyp     January 2014   EYE SURGERY  1988   Cyst on eye removed   mouth palate surgery repair  1961   large tear   ROOT CANEL     2 WEEKS AGO   rt knee surgery arthroscopy  2009   torn meniscus/medial   torn ligament shoulder  1975   repaired   TOTAL KNEE ARTHROPLASTY Right 09/20/2024   Procedure: ARTHROPLASTY, KNEE, TOTAL;  Surgeon: Edna Toribio LABOR, MD;  Location: [facility];  Service: Orthopedics;  Laterality: Right;   Patient Active Problem List   Diagnosis Date Noted   Localized osteoarthritis of right knee 09/20/2024   B12 deficiency 08/17/2024   Primary osteoarthritis of right knee 12/04/2021   CKD (chronic kidney disease) stage 3, GFR 30-59 ml/min (HCC) 11/12/2021   Pre-ulcerative corn or callous 05/20/2020   Excessive sweating 05/20/2020   Gout 03/07/2018   Hereditary and idiopathic peripheral neuropathy  09/24/2015   Trochanteric bursitis of both hips 10/08/2014   Lumbar spinal stenosis 09/06/2014   Right knee pain 09/06/2014   Obstructive sleep apnea 09/26/2010   Left rotator cuff tear 03/18/2010   PARESTHESIA, HANDS 03/18/2010   Diabetes mellitus type 2 in obese 02/07/2008   Hypothyroidism 01/06/2007   Hyperlipidemia 01/06/2007   ANEMIA NOS 01/06/2007   HYPERTENSION, BENIGN ESSENTIAL 01/06/2007    PCP: Dorothyann Byars, MD REFERRING PROVIDER: Toribio Edna, MD REFERRING DIAG:  Diagnosis  [id number] (ICD-10-CM) - Presence of right artificial knee joint    THERAPY DIAG:  Acute pain of right knee  Muscle weakness (generalized)  Other abnormalities of gait and mobility  Rationale for Evaluation and Treatment: Rehabilitation  ONSET DATE: 09/20/2024  SUBJECTIVE:   SUBJECTIVE STATEMENT: The patient reports she is sleeping well with pain medicine. She has one spot on the incision that feels tight. She is using ice throughout the day.   EVAL: The patient reports nerve block wore off yesterday. She c/o tightness sensation in stitching. She is using RW at home.   PERTINENT HISTORY: Diabetes Neuropathy in feet  Paresthesia hands HTN PAIN:  Are you having pain? Yes: NPRS scale: 2/10 Pain location: R knee Pain description:  soreness Aggravating factors: weight bearing Relieving factors: rest  PRECAUTIONS: Fall  WEIGHT BEARING RESTRICTIONS: Yes WBAT  FALLS:  Has patient fallen in last 6 months? Yes. Number of falls knees gave way, and then neuropathy-- couldn't baance  LIVING ENVIRONMENT: Lives with: lives with friend Lives in: House/apartment Stairs: Yes: External: 1 steps; can use walker Has following equipment at home: Walker - 2 wheeled, BSC over toilet, shower chair  PATIENT GOALS: pain free walking, being able to travel without pain  NEXT MD VISIT: 10/02/24  OBJECTIVE:  Note: Objective measures were completed at Evaluation unless otherwise  noted.  PATIENT SURVEYS:  LEFS =15%  COGNITION: Overall cognitive status: Within functional limits for tasks assessed     SENSATION: Numbness in bilat feet  EDEMA:  Circumferential: 41cm R and 39 cm L  POSTURE: rounded shoulders  PALPATION: Edema noted in R LE   LOWER EXTREMITY ROM: Passive ROM Right eval Left eval  Hip flexion    Hip extension    Hip abduction    Hip adduction    Hip internal rotation    Hip external rotation    Knee flexion    Knee extension -10 Quad set with towel under foot  78 deg  AROM to 64 supine heel slide  Ankle dorsiflexion    Ankle plantarflexion    Ankle inversion    Ankle eversion     (Blank rows = not tested)  LOWER EXTREMITY MMT: NT due to pain today/ post surgical MMT Right eval Left eval  Hip flexion    Hip extension    Hip abduction    Hip adduction    Hip internal rotation    Hip external rotation    Knee flexion    Knee extension <3/5 --unable to lift fully against gravity   Ankle dorsiflexion    Ankle plantarflexion    Ankle inversion    Ankle eversion     (Blank rows = not tested)  FUNCTIONAL TESTS:  Sit<>stand x 2 reps with UE support slowed pace  GAIT: Distance walked: x 75 feet Assistive device utilized: Walker - 2 wheeled Level of assistance: SBA Comments: step to pattern Stair negotiation step to pattern with SBA x 2 steps   [facility] Adult PT Treatment:                                                DATE: 09/26/24 Therapeutic Exercise: Nu-step x 5 minutes level 3 for AAROM R knee Supine heel slide x 10 reps Rolling a physioball into knee flexion/extension  Therapeutic Activity: Supine SLR x 10 reps with cues for quad set Sidelying hip abduction x 10 reps Standing heel cord stretch at wall R and L Heel raises x 10 reps Seated LAQ x 10 reps Gait: Gait without device x 20 feet Gait with SPC mod indep x 80 feet x 3 reps    [facility] Adult PT Treatment:                                                 DATE: 09/22/24 Therapeutic Exercise: See HEP below Modalities: Vasopneumatic device x 10 minutes 34 deg  PATIENT EDUCATION:  Education details: HEP, plan of care Person educated: Patient Education  method: Explanation, Demonstration, and Handouts Education comprehension: verbalized understanding, returned demonstration, and needs further education  HOME EXERCISE PROGRAM: Access Code: [email or web address] URL: [email or web address] Date: 09/26/2024 Prepared by: Tawni Ferrier  Exercises - Supine Quad Set  - 3 x daily - 7 x weekly - 1 sets - 10 reps - 10 hold - Supine Active Straight Leg Raise  - 2 x daily - 7 x weekly - 1 sets - 10 reps - Supine Heel Slide with Strap  - 3 x daily - 7 x weekly - 1 sets - 10 reps - 10 hold - Sidelying Hip Abduction  - 2 x daily - 7 x weekly - 1 sets - 10 reps - Seated Long Arc Quad  - 3 x daily - 7 x weekly - 1 sets - 10 reps - Seated Heel Slide  - 2 x daily - 7 x weekly - 1 sets - 10 reps - Seated Ankle Pumps  - 2 x daily - 7 x weekly - 1 sets - 10 reps - Heel Raises with Counter Support  - 2 x daily - 7 x weekly - 1 sets - 10 reps  ASSESSMENT:  CLINICAL IMPRESSION: The patient is making great progress with AAROM for flexion and working towards full extension--she is using her heel support at home to increase knee extension. She is mod indep with SPC today walking in clinic. The patient is tolerating progression of HEP into standing activities. Plan to continue to progress to patient tolerance.   EVAL:  Patient is a 68 y.o. female who was seen today for physical therapy evaluation and treatment for s/p R TKR. She presents with impairments in AROM, muscle strength, gait mechanics, transfers, and pain. PT to address deficits with goals to return to community ambulation mod indep.    OBJECTIVE IMPAIRMENTS: Abnormal gait, decreased activity tolerance, decreased balance, decreased mobility, decreased ROM, decreased strength, increased fascial  restrictions, impaired flexibility, impaired tone, and pain.    GOALS: Goals reviewed with patient? Yes  SHORT TERM GOALS: Target date: 10/21/24  The patient will be indep with initial HEP Baseline: initiated at eval Goal status: INITIAL  2.  The patient will improve PROM to 100 degrees R knee flexion  Baseline: see above Goal status: INITIAL  3.  The patient will improve quad set knee extension (towel under heel) to -5 degrees from full extension. Baseline:  see above Goal status: INITIAL  LONG TERM GOALS: Target date: 11/20/2024   The patient will be indep with progression of HEP. Baseline:  initiated at eval Goal status: INITIAL  2.  The patient will improve LEFS by 30%  to demonstrate improved functional abilities. Baseline: see above Goal status: INITIAL  3.  The patient will negotiate 4 steps with reciprocal pattern mod indep. Baseline:  step to pattern Goal status: INITIAL  4.  The patient will imrpove AROM R knee to 110 degrees flexion. Baseline:  see above Goal status: INITIAL  5.  The patient will improve AROM R knee to full extension. Baseline:  see above Goal status: INITIAL  6.  The patient will ambulate without device mod indep. Baseline:  RW Goal status: INITIAL  PLAN:  PT FREQUENCY: 2x/week  PT DURATION: 8 weeks  PLANNED INTERVENTIONS: 97164- PT Re-evaluation, 97750- Physical Performance Testing, 97110-Therapeutic exercises, 97530- Therapeutic activity, [id number]- Neuromuscular re-education, 97535- Self Care, 02859- Manual therapy, [id number]- Gait training, [phone]- Aquatic Therapy, [phone]- Electrical stimulation (unattended), 97016- Vasopneumatic device, 20560 (1-2 muscles), 20561 (3+ muscles)- Dry Needling, Patient/Family education,  Balance training, Stair training, Taping, Joint mobilization, Cryotherapy, and Moist heat  PLAN FOR NEXT SESSION: progress HEP to patient tolerance. *She does ice at home (if vaso not available).    Alannis Hsia,  PT 09/26/2024, 3:50 PM

## 2024-09-29 ENCOUNTER — Other Ambulatory Visit (HOSPITAL_COMMUNITY): Payer: Self-pay

## 2024-09-29 ENCOUNTER — Ambulatory Visit (INDEPENDENT_AMBULATORY_CARE_PROVIDER_SITE_OTHER)

## 2024-09-29 ENCOUNTER — Ambulatory Visit

## 2024-09-29 VITALS — BP 115/63 | HR 87 | Resp 18 | Ht 65.0 in

## 2024-09-29 DIAGNOSIS — M25561 Pain in right knee: Secondary | ICD-10-CM | POA: Diagnosis not present

## 2024-09-29 DIAGNOSIS — M6281 Muscle weakness (generalized): Secondary | ICD-10-CM

## 2024-09-29 DIAGNOSIS — E538 Deficiency of other specified B group vitamins: Secondary | ICD-10-CM | POA: Diagnosis not present

## 2024-09-29 DIAGNOSIS — R2689 Other abnormalities of gait and mobility: Secondary | ICD-10-CM | POA: Diagnosis not present

## 2024-09-29 DIAGNOSIS — M25562 Pain in left knee: Secondary | ICD-10-CM | POA: Diagnosis not present

## 2024-09-29 DIAGNOSIS — G8929 Other chronic pain: Secondary | ICD-10-CM | POA: Diagnosis not present

## 2024-09-29 NOTE — Progress Notes (Signed)
   Subjective:    Patient ID: Alexis Mcneil, female    DOB: [birth year], 68 y.o.   MRN: [id number]  HPI  Patient is here for her 1st 2 wk B12 injection she is to cont. Every 14 days for 1 month and then transition to monthly. Denies muscle cramps, weakness or irregular heart rate.   Review of Systems     Objective:   Physical Exam        Assessment & Plan:   Patient given B12 injection in her LD. Tolerated well no redness or swelling noted at the site. Patient advised to RTC in 14 days for next injection. (Around 10/13/24)

## 2024-09-29 NOTE — Therapy (Signed)
 OUTPATIENT PHYSICAL THERAPY LOWER EXTREMITY TREATMENT  Patient Name: Alexis Mcneil MRN: [id number] DOB:October 12, 1956, 68 y.o., female Today's Date: 09/29/2024  END OF SESSION:  PT End of Session - 09/29/24 0758     Visit Number 3    Number of Visits 16    Date for Recertification  11/21/24    Authorization Type Medicare Part A and B    PT Start Time 0800    PT Stop Time 0842    PT Time Calculation (min) 42 min    Activity Tolerance Patient tolerated treatment well    Behavior During Therapy [facility] for tasks assessed/performed          Past Medical History:  Diagnosis Date   Anemia    Arthritis    KNEE,SHOULDERS   Cataract January 2021   Eye appt visit   Diabetes mellitus without complication (HCC)    GERD (gastroesophageal reflux disease)    Heart murmur    Hyperlipidemia    Hypertension    IFG (impaired fasting glucose)    Neuromuscular disorder (HCC)    NEUROPATHY FEET   OSA (obstructive sleep apnea)    CPAP   Sleep apnea    CPAP   Thyroid  disease    Past Surgical History:  Procedure Laterality Date   COLONOSCOPY     endometrial polyp     January 2014   EYE SURGERY  1988   Cyst on eye removed   mouth palate surgery repair  1961   large tear   ROOT CANEL     2 WEEKS AGO   rt knee surgery arthroscopy  2009   torn meniscus/medial   torn ligament shoulder  1975   repaired   TOTAL KNEE ARTHROPLASTY Right 09/20/2024   Procedure: ARTHROPLASTY, KNEE, TOTAL;  Surgeon: Edna Toribio LABOR, MD;  Location: [facility];  Service: Orthopedics;  Laterality: Right;   Patient Active Problem List   Diagnosis Date Noted   Localized osteoarthritis of right knee 09/20/2024   B12 deficiency 08/17/2024   Primary osteoarthritis of right knee 12/04/2021   CKD (chronic kidney disease) stage 3, GFR 30-59 ml/min (HCC) 11/12/2021   Pre-ulcerative corn or callous 05/20/2020   Excessive sweating 05/20/2020   Gout 03/07/2018   Hereditary and idiopathic peripheral neuropathy  09/24/2015   Trochanteric bursitis of both hips 10/08/2014   Lumbar spinal stenosis 09/06/2014   Right knee pain 09/06/2014   Obstructive sleep apnea 09/26/2010   Left rotator cuff tear 03/18/2010   PARESTHESIA, HANDS 03/18/2010   Diabetes mellitus type 2 in obese 02/07/2008   Hypothyroidism 01/06/2007   Hyperlipidemia 01/06/2007   ANEMIA NOS 01/06/2007   HYPERTENSION, BENIGN ESSENTIAL 01/06/2007    PCP: Dorothyann Byars, MD REFERRING PROVIDER: Toribio Edna, MD REFERRING DIAG:  Diagnosis  [phone] (ICD-10-CM) - Presence of right artificial knee joint    THERAPY DIAG:  Acute pain of right knee  Muscle weakness (generalized)  Other abnormalities of gait and mobility  Rationale for Evaluation and Treatment: Rehabilitation  ONSET DATE: 09/20/2024  SUBJECTIVE:   SUBJECTIVE STATEMENT: It feels pretty good.  EVAL: The patient reports nerve block wore off yesterday. She c/o tightness sensation in stitching. She is using RW at home.   PERTINENT HISTORY: Diabetes Neuropathy in feet  Paresthesia hands HTN PAIN:  Are you having pain? Yes: NPRS scale: 1/10 Pain location: R knee Pain description: soreness Aggravating factors: weight bearing Relieving factors: rest  PRECAUTIONS: Fall  WEIGHT BEARING RESTRICTIONS: Yes WBAT  FALLS:  Has patient fallen in  last 6 months? Yes. Number of falls knees gave way, and then neuropathy-- couldn't baance  LIVING ENVIRONMENT: Lives with: lives with friend Lives in: House/apartment Stairs: Yes: External: 1 steps; can use walker Has following equipment at home: Walker - 2 wheeled, BSC over toilet, shower chair  PATIENT GOALS: pain free walking, being able to travel without pain  NEXT MD VISIT: 10/02/24  OBJECTIVE:  Note: Objective measures were completed at Evaluation unless otherwise noted.  PATIENT SURVEYS:  LEFS =15%  COGNITION: Overall cognitive status: Within functional limits for tasks  assessed     SENSATION: Numbness in bilat feet  EDEMA:  Circumferential: 41cm R and 39 cm L  POSTURE: rounded shoulders  PALPATION: Edema noted in R LE   LOWER EXTREMITY ROM: Passive ROM Right eval Left eval 09/29/24 Right AROM  Hip flexion     Hip extension     Hip abduction     Hip adduction     Hip internal rotation     Hip external rotation     Knee flexion   89  Knee extension -10 Quad set with towel under foot  78 deg  AROM to 64 supine heel slide Lacking 5   Ankle dorsiflexion     Ankle plantarflexion     Ankle inversion     Ankle eversion      (Blank rows = not tested)  LOWER EXTREMITY MMT: NT due to pain today/ post surgical MMT Right eval Left eval  Hip flexion    Hip extension    Hip abduction    Hip adduction    Hip internal rotation    Hip external rotation    Knee flexion    Knee extension <3/5 --unable to lift fully against gravity   Ankle dorsiflexion    Ankle plantarflexion    Ankle inversion    Ankle eversion     (Blank rows = not tested)  FUNCTIONAL TESTS:  Sit<>stand x 2 reps with UE support slowed pace  GAIT: Distance walked: x 75 feet Assistive device utilized: Walker - 2 wheeled Level of assistance: SBA Comments: step to pattern Stair negotiation step to pattern with SBA x 2 steps  [facility] Adult PT Treatment:                                                DATE: 09/29/24 Therapeutic Exercise: Recumbent bike rocking x 5 minutes no resistance for ROM  Seated knee flexion AAROM x 10 Seated knee extension AAROM x 10  HS stretch with strap 3 x 20 sec  Standing calf raise 2 x 10   Neuromuscular re-ed: SAQ 2 x 10  LAQ 2 x 10  TKE at wall with ball 2 x 10    [facility] Adult PT Treatment:                                                DATE: 09/26/24 Therapeutic Exercise: Nu-step x 5 minutes level 3 for AAROM R knee Supine heel slide x 10 reps Rolling a physioball into knee flexion/extension  Therapeutic Activity: Supine SLR x 10  reps with cues for quad set Sidelying hip abduction x 10 reps Standing heel cord stretch at wall R and L  Heel raises x 10 reps Seated LAQ x 10 reps Gait: Gait without device x 20 feet Gait with SPC mod indep x 80 feet x 3 reps    [facility] Adult PT Treatment:                                                DATE: 09/22/24 Therapeutic Exercise: See HEP below Modalities: Vasopneumatic device x 10 minutes 34 deg  PATIENT EDUCATION:  Education details: HEP update  Person educated: Patient Education method: Explanation, Demonstration, and Handouts Education comprehension: verbalized understanding, returned demonstration, and needs further education  HOME EXERCISE PROGRAM: Access Code: [email or web address] URL: [email or web address] Date: 09/29/2024 Prepared by: Lucie Meeter  Exercises - Supine Quad Set  - 3 x daily - 7 x weekly - 1 sets - 10 reps - 10 hold - Supine Active Straight Leg Raise  - 2 x daily - 7 x weekly - 1 sets - 10 reps - Supine Heel Slide with Strap  - 3 x daily - 7 x weekly - 1 sets - 10 reps - 10 hold - Sidelying Hip Abduction  - 2 x daily - 7 x weekly - 1 sets - 10 reps - Seated Long Arc Quad  - 3 x daily - 7 x weekly - 1 sets - 10 reps - Seated Ankle Pumps  - 2 x daily - 7 x weekly - 1 sets - 10 reps - Heel Raises with Counter Support  - 2 x daily - 7 x weekly - 1 sets - 10 reps - Seated Knee Flexion Extension AAROM with Overpressure  - 2 x daily - 7 x weekly - 1 sets - 10 reps - 5 sec  hold  ASSESSMENT:  CLINICAL IMPRESSION: Focused on progression of Rt knee ROM and strengthening activity with good tolerance. Her knee flexion and extension AROM is gradually improving (see objective above for specifics). Demonstrates good quad activation with SAQ and LAQ. No increase in pain noted throughout session.   EVAL:  Patient is a 68 y.o. female who was seen today for physical therapy evaluation and treatment for s/p R TKR. She presents with impairments in AROM, muscle  strength, gait mechanics, transfers, and pain. PT to address deficits with goals to return to community ambulation mod indep.    OBJECTIVE IMPAIRMENTS: Abnormal gait, decreased activity tolerance, decreased balance, decreased mobility, decreased ROM, decreased strength, increased fascial restrictions, impaired flexibility, impaired tone, and pain.    GOALS: Goals reviewed with patient? Yes  SHORT TERM GOALS: Target date: 10/21/24  The patient will be indep with initial HEP Baseline: initiated at eval Goal status: INITIAL  2.  The patient will improve PROM to 100 degrees R knee flexion  Baseline: see above Goal status: INITIAL  3.  The patient will improve quad set knee extension (towel under heel) to -5 degrees from full extension. Baseline:  see above Goal status: INITIAL  LONG TERM GOALS: Target date: 11/20/2024   The patient will be indep with progression of HEP. Baseline:  initiated at eval Goal status: INITIAL  2.  The patient will improve LEFS by 30%  to demonstrate improved functional abilities. Baseline: see above Goal status: INITIAL  3.  The patient will negotiate 4 steps with reciprocal pattern mod indep. Baseline:  step to pattern Goal status: INITIAL  4.  The patient will imrpove AROM R  knee to 110 degrees flexion. Baseline:  see above Goal status: INITIAL  5.  The patient will improve AROM R knee to full extension. Baseline:  see above Goal status: INITIAL  6.  The patient will ambulate without device mod indep. Baseline:  RW Goal status: INITIAL  PLAN:  PT FREQUENCY: 2x/week  PT DURATION: 8 weeks  PLANNED INTERVENTIONS: 97164- PT Re-evaluation, 97750- Physical Performance Testing, 97110-Therapeutic exercises, 97530- Therapeutic activity, [id number]- Neuromuscular re-education, 97535- Self Care, 02859- Manual therapy, [id number]- Gait training, [phone]- Aquatic Therapy, [phone]- Electrical stimulation (unattended), 97016- Vasopneumatic device, 20560 (1-2 muscles),  20561 (3+ muscles)- Dry Needling, Patient/Family education, Balance training, Stair training, Taping, Joint mobilization, Cryotherapy, and Moist heat  PLAN FOR NEXT SESSION: progress HEP to patient tolerance. *She does ice at home (if vaso not available).   Shereta Crothers, PT, DPT, ATC 09/29/24 8:43 AM

## 2024-10-02 ENCOUNTER — Ambulatory Visit: Admitting: Rehabilitative and Restorative Service Providers"

## 2024-10-02 ENCOUNTER — Encounter: Payer: Self-pay | Admitting: Rehabilitative and Restorative Service Providers"

## 2024-10-02 DIAGNOSIS — M25561 Pain in right knee: Secondary | ICD-10-CM | POA: Diagnosis not present

## 2024-10-02 DIAGNOSIS — M6281 Muscle weakness (generalized): Secondary | ICD-10-CM

## 2024-10-02 DIAGNOSIS — R2689 Other abnormalities of gait and mobility: Secondary | ICD-10-CM

## 2024-10-02 DIAGNOSIS — G8929 Other chronic pain: Secondary | ICD-10-CM | POA: Diagnosis not present

## 2024-10-02 DIAGNOSIS — M25562 Pain in left knee: Secondary | ICD-10-CM | POA: Diagnosis not present

## 2024-10-02 NOTE — Therapy (Signed)
 OUTPATIENT PHYSICAL THERAPY LOWER EXTREMITY TREATMENT  Patient Name: Alexis Mcneil MRN: [id number] DOB:Aug 13, 1956, 68 y.o., female Today's Date: 10/02/2024  END OF SESSION:  PT End of Session - 10/02/24 0841     Visit Number 4    Number of Visits 16    Date for Recertification  11/21/24    Authorization Type Medicare Part A and B    PT Start Time [phone]    PT Stop Time 0930    PT Time Calculation (min) 48 min    Activity Tolerance Patient tolerated treatment well    Behavior During Therapy [facility] for tasks assessed/performed           Past Medical History:  Diagnosis Date   Anemia    Arthritis    KNEE,SHOULDERS   Cataract January 2021   Eye appt visit   Diabetes mellitus without complication (HCC)    GERD (gastroesophageal reflux disease)    Heart murmur    Hyperlipidemia    Hypertension    IFG (impaired fasting glucose)    Neuromuscular disorder (HCC)    NEUROPATHY FEET   OSA (obstructive sleep apnea)    CPAP   Sleep apnea    CPAP   Thyroid  disease    Past Surgical History:  Procedure Laterality Date   COLONOSCOPY     endometrial polyp     January 2014   EYE SURGERY  1988   Cyst on eye removed   mouth palate surgery repair  1961   large tear   ROOT CANEL     2 WEEKS AGO   rt knee surgery arthroscopy  2009   torn meniscus/medial   torn ligament shoulder  1975   repaired   TOTAL KNEE ARTHROPLASTY Right 09/20/2024   Procedure: ARTHROPLASTY, KNEE, TOTAL;  Surgeon: Edna Toribio LABOR, MD;  Location: [facility];  Service: Orthopedics;  Laterality: Right;   Patient Active Problem List   Diagnosis Date Noted   Localized osteoarthritis of right knee 09/20/2024   B12 deficiency 08/17/2024   Primary osteoarthritis of right knee 12/04/2021   CKD (chronic kidney disease) stage 3, GFR 30-59 ml/min (HCC) 11/12/2021   Pre-ulcerative corn or callous 05/20/2020   Excessive sweating 05/20/2020   Gout 03/07/2018   Hereditary and idiopathic peripheral neuropathy  09/24/2015   Trochanteric bursitis of both hips 10/08/2014   Lumbar spinal stenosis 09/06/2014   Right knee pain 09/06/2014   Obstructive sleep apnea 09/26/2010   Left rotator cuff tear 03/18/2010   PARESTHESIA, HANDS 03/18/2010   Diabetes mellitus type 2 in obese 02/07/2008   Hypothyroidism 01/06/2007   Hyperlipidemia 01/06/2007   ANEMIA NOS 01/06/2007   HYPERTENSION, BENIGN ESSENTIAL 01/06/2007    PCP: Dorothyann Byars, MD REFERRING PROVIDER: Toribio Edna, MD REFERRING DIAG:  Diagnosis  [phone] (ICD-10-CM) - Presence of right artificial knee joint    THERAPY DIAG:  Acute pain of right knee  Muscle weakness (generalized)  Other abnormalities of gait and mobility  Rationale for Evaluation and Treatment: Rehabilitation  ONSET DATE: 09/20/2024  SUBJECTIVE:   SUBJECTIVE STATEMENT: The patient is no longer using the walker, she is using SPC.  The L knee is starting to hurt more-- injection seems to have worn off.  EVAL: The patient reports nerve block wore off yesterday. She c/o tightness sensation in stitching. She is using RW at home.   PERTINENT HISTORY: Diabetes Neuropathy in feet  Paresthesia hands HTN PAIN:  Are you having pain? Yes: NPRS scale: 2/10 Pain location: R knee Pain description:  soreness, stiffness Aggravating factors: weight bearing Relieving factors: rest  PRECAUTIONS: Fall  WEIGHT BEARING RESTRICTIONS: Yes WBAT  FALLS:  Has patient fallen in last 6 months? Yes. Number of falls knees gave way, and then neuropathy-- couldn't baance  LIVING ENVIRONMENT: Lives with: lives with friend Lives in: House/apartment Stairs: Yes: External: 1 steps; can use walker Has following equipment at home: Walker - 2 wheeled, BSC over toilet, shower chair  PATIENT GOALS: pain free walking, being able to travel without pain  NEXT MD VISIT: 10/03/24  OBJECTIVE:  Note: Objective measures were completed at Evaluation unless otherwise noted.  PATIENT  SURVEYS:  LEFS =15%  COGNITION: Overall cognitive status: Within functional limits for tasks assessed     SENSATION: Numbness in bilat feet  EDEMA:  Circumferential: 41cm R and 39 cm L  POSTURE: rounded shoulders  PALPATION: Edema noted in R LE   LOWER EXTREMITY ROM: Passive ROM Right eval Left eval 09/29/24 Right AROM  Hip flexion     Hip extension     Hip abduction     Hip adduction     Hip internal rotation     Hip external rotation     Knee flexion   89  Knee extension -10 Quad set with towel under foot  78 deg  AROM to 64 supine heel slide Lacking 5   Ankle dorsiflexion     Ankle plantarflexion     Ankle inversion     Ankle eversion      (Blank rows = not tested)  LOWER EXTREMITY MMT: NT due to pain today/ post surgical MMT Right eval Left eval  Hip flexion    Hip extension    Hip abduction    Hip adduction    Hip internal rotation    Hip external rotation    Knee flexion    Knee extension <3/5 --unable to lift fully against gravity   Ankle dorsiflexion    Ankle plantarflexion    Ankle inversion    Ankle eversion     (Blank rows = not tested)  FUNCTIONAL TESTS:  Sit<>stand x 2 reps with UE support slowed pace  GAIT: Distance walked: x 75 feet Assistive device utilized: Walker - 2 wheeled Level of assistance: SBA Comments: step to pattern Stair negotiation step to pattern with SBA x 2 steps  [facility] Adult PT Treatment:                                                DATE: 10/02/24 Therapeutic Exercise: Recumbent bike rocking x 3 minutes for warm up, no reistance, for ROM Supine Quad set Heel slide x 3 reps--painful on anterior surface SLR x 10 reps HS stretch with strap x 3 x 20 seconds  Prone Knee flexion AROM with passive overpressure Contract/relax HS Manual Therapy: Medial HS STM and gastroc STM Neuromuscular re-ed: Terminal knee extension into deflated ball for quad engagement x 5 reps x 3 seconds Therapeutic Activity: Standing   heel cord stretch R and L  heel raises x 10 reps with UE support toe raises x 10 reps marches knee flexion Gait: Gait activities with SPC (adjusted to correct height, then no device. Patient demonstrates good R heel strike with equal weight shift R and L sides Modalities: Vaso 34 degrees x 10 minutes R knee   [facility] Adult PT Treatment:  DATE: 09/29/24 Therapeutic Exercise: Recumbent bike rocking x 5 minutes no resistance for ROM  Seated knee flexion AAROM x 10 Seated knee extension AAROM x 10  HS stretch with strap 3 x 20 sec  Standing calf raise 2 x 10  Neuromuscular re-ed: SAQ 2 x 10  LAQ 2 x 10  TKE at wall with ball 2 x 10    [facility] Adult PT Treatment:                                                DATE: 09/26/24 Therapeutic Exercise: Nu-step x 5 minutes level 3 for AAROM R knee Supine heel slide x 10 reps Rolling a physioball into knee flexion/extension  Therapeutic Activity: Supine SLR x 10 reps with cues for quad set Sidelying hip abduction x 10 reps Standing heel cord stretch at wall R and L Heel raises x 10 reps Seated LAQ x 10 reps Gait: Gait without device x 20 feet Gait with SPC mod indep x 80 feet x 3 reps    [facility] Adult PT Treatment:                                                DATE: 09/22/24 Therapeutic Exercise: See HEP below Modalities: Vasopneumatic device x 10 minutes 34 deg  PATIENT EDUCATION:  Education details: HEP update  Person educated: Patient Education method: Explanation, Demonstration, and Handouts Education comprehension: verbalized understanding, returned demonstration, and needs further education  HOME EXERCISE PROGRAM: Access Code: [email or web address] URL: [email or web address] Date: 09/29/2024 Prepared by: Lucie Meeter  Exercises - Supine Quad Set  - 3 x daily - 7 x weekly - 1 sets - 10 reps - 10 hold - Supine Active Straight Leg Raise  - 2 x daily - 7 x weekly - 1 sets - 10  reps - Supine Heel Slide with Strap  - 3 x daily - 7 x weekly - 1 sets - 10 reps - 10 hold - Sidelying Hip Abduction  - 2 x daily - 7 x weekly - 1 sets - 10 reps - Seated Long Arc Quad  - 3 x daily - 7 x weekly - 1 sets - 10 reps - Seated Ankle Pumps  - 2 x daily - 7 x weekly - 1 sets - 10 reps - Heel Raises with Counter Support  - 2 x daily - 7 x weekly - 1 sets - 10 reps - Seated Knee Flexion Extension AAROM with Overpressure  - 2 x daily - 7 x weekly - 1 sets - 10 reps - 5 sec  hold  ASSESSMENT:  CLINICAL IMPRESSION: The patient continues to improve knee ROM-- she has tightness anteriorly where surgical dressing is (gets removed tomorrow). Her gait is normalizing with good R heel strike, good R weight shift. PT to continue to focus on R knee flexion to initiate R swing phase of gait. Patient demonstrating strong quad contraction and improving terminal knee extension. Some discomfort noted today with anterior knee--ended with vaso to reduce pain.   EVAL:  Patient is a 68 y.o. female who was seen today for physical therapy evaluation and treatment for s/p R TKR. She presents with impairments in AROM, muscle strength, gait mechanics, transfers, and pain.  PT to address deficits with goals to return to community ambulation mod indep.    OBJECTIVE IMPAIRMENTS: Abnormal gait, decreased activity tolerance, decreased balance, decreased mobility, decreased ROM, decreased strength, increased fascial restrictions, impaired flexibility, impaired tone, and pain.    GOALS: Goals reviewed with patient? Yes  SHORT TERM GOALS: Target date: 10/21/24  The patient will be indep with initial HEP Baseline: initiated at eval Goal status: INITIAL  2.  The patient will improve PROM to 100 degrees R knee flexion  Baseline: see above Goal status: INITIAL  3.  The patient will improve quad set knee extension (towel under heel) to -5 degrees from full extension. Baseline:  see above Goal status: MET  10/02/24  LONG TERM GOALS: Target date: 11/20/2024   The patient will be indep with progression of HEP. Baseline:  initiated at eval Goal status: INITIAL  2.  The patient will improve LEFS by 30%  to demonstrate improved functional abilities. Baseline: see above Goal status: INITIAL  3.  The patient will negotiate 4 steps with reciprocal pattern mod indep. Baseline:  step to pattern Goal status: INITIAL  4.  The patient will imrpove AROM R knee to 110 degrees flexion. Baseline:  see above Goal status: INITIAL  5.  The patient will improve AROM R knee to full extension. Baseline:  see above Goal status: INITIAL  6.  The patient will ambulate without device mod indep. Baseline:  RW Goal status: INITIAL  PLAN:  PT FREQUENCY: 2x/week  PT DURATION: 8 weeks  PLANNED INTERVENTIONS: 97164- PT Re-evaluation, 97750- Physical Performance Testing, 97110-Therapeutic exercises, 97530- Therapeutic activity, [id number]- Neuromuscular re-education, 97535- Self Care, [id number]- Manual therapy, [phone]- Gait training, [phone]- Aquatic Therapy, [phone]- Electrical stimulation (unattended), 97016- Vasopneumatic device, 20560 (1-2 muscles), 20561 (3+ muscles)- Dry Needling, Patient/Family education, Balance training, Stair training, Taping, Joint mobilization, Cryotherapy, and Moist heat  PLAN FOR NEXT SESSION: progress HEP to patient tolerance, continue to work on knee flexion during initial swing phase of gait as knee flexion ROM improves.   Jianni Shelden, PT 10/02/24 9:23 AM

## 2024-10-03 DIAGNOSIS — Z96651 Presence of right artificial knee joint: Secondary | ICD-10-CM | POA: Diagnosis not present

## 2024-10-03 DIAGNOSIS — Z471 Aftercare following joint replacement surgery: Secondary | ICD-10-CM | POA: Diagnosis not present

## 2024-10-03 DIAGNOSIS — M7541 Impingement syndrome of right shoulder: Secondary | ICD-10-CM | POA: Diagnosis not present

## 2024-10-04 ENCOUNTER — Ambulatory Visit

## 2024-10-04 DIAGNOSIS — M25561 Pain in right knee: Secondary | ICD-10-CM | POA: Diagnosis not present

## 2024-10-04 DIAGNOSIS — R2689 Other abnormalities of gait and mobility: Secondary | ICD-10-CM | POA: Diagnosis not present

## 2024-10-04 DIAGNOSIS — G8929 Other chronic pain: Secondary | ICD-10-CM | POA: Diagnosis not present

## 2024-10-04 DIAGNOSIS — M25562 Pain in left knee: Secondary | ICD-10-CM | POA: Diagnosis not present

## 2024-10-04 DIAGNOSIS — M6281 Muscle weakness (generalized): Secondary | ICD-10-CM | POA: Diagnosis not present

## 2024-10-04 NOTE — Therapy (Signed)
 OUTPATIENT PHYSICAL THERAPY LOWER EXTREMITY TREATMENT  Patient Name: Alexis Mcneil MRN: [id number] DOB:11/08/1956, 68 y.o., female Today's Date: 10/04/2024  END OF SESSION:  PT End of Session - 10/04/24 1231     Visit Number 5    Number of Visits 16    Date for Recertification  11/21/24    Authorization Type Medicare Part A and B    PT Start Time 1230    PT Stop Time 1328    PT Time Calculation (min) 58 min    Activity Tolerance Patient tolerated treatment well    Behavior During Therapy [facility] for tasks assessed/performed         Past Medical History:  Diagnosis Date   Anemia    Arthritis    KNEE,SHOULDERS   Cataract January 2021   Eye appt visit   Diabetes mellitus without complication (HCC)    GERD (gastroesophageal reflux disease)    Heart murmur    Hyperlipidemia    Hypertension    IFG (impaired fasting glucose)    Neuromuscular disorder (HCC)    NEUROPATHY FEET   OSA (obstructive sleep apnea)    CPAP   Sleep apnea    CPAP   Thyroid  disease    Past Surgical History:  Procedure Laterality Date   COLONOSCOPY     endometrial polyp     January 2014   EYE SURGERY  1988   Cyst on eye removed   mouth palate surgery repair  1961   large tear   ROOT CANEL     2 WEEKS AGO   rt knee surgery arthroscopy  2009   torn meniscus/medial   torn ligament shoulder  1975   repaired   TOTAL KNEE ARTHROPLASTY Right 09/20/2024   Procedure: ARTHROPLASTY, KNEE, TOTAL;  Surgeon: Edna Toribio LABOR, MD;  Location: [facility];  Service: Orthopedics;  Laterality: Right;   Patient Active Problem List   Diagnosis Date Noted   Localized osteoarthritis of right knee 09/20/2024   B12 deficiency 08/17/2024   Primary osteoarthritis of right knee 12/04/2021   CKD (chronic kidney disease) stage 3, GFR 30-59 ml/min (HCC) 11/12/2021   Pre-ulcerative corn or callous 05/20/2020   Excessive sweating 05/20/2020   Gout 03/07/2018   Hereditary and idiopathic peripheral neuropathy 09/24/2015    Trochanteric bursitis of both hips 10/08/2014   Lumbar spinal stenosis 09/06/2014   Right knee pain 09/06/2014   Obstructive sleep apnea 09/26/2010   Left rotator cuff tear 03/18/2010   PARESTHESIA, HANDS 03/18/2010   Diabetes mellitus type 2 in obese 02/07/2008   Hypothyroidism 01/06/2007   Hyperlipidemia 01/06/2007   ANEMIA NOS 01/06/2007   HYPERTENSION, BENIGN ESSENTIAL 01/06/2007    PCP: Dorothyann Byars, MD REFERRING PROVIDER: Toribio Edna, MD REFERRING DIAG:  Diagnosis  [phone] (ICD-10-CM) - Presence of right artificial knee joint    THERAPY DIAG:  Acute pain of right knee  Other abnormalities of gait and mobility  Rationale for Evaluation and Treatment: Rehabilitation  ONSET DATE: 09/20/2024  SUBJECTIVE:   SUBJECTIVE STATEMENT: Patient reports her calf has tight spots; reports no knee pain but tightness/discomfort across incision. Received good report from surgeon on follow-up visit.   EVAL: The patient reports nerve block wore off yesterday. She c/o tightness sensation in stitching. She is using RW at home.   PERTINENT HISTORY: Diabetes Neuropathy in feet  Paresthesia hands HTN PAIN:  Are you having pain? Yes: NPRS scale: 1/10 Pain location: R knee Pain description: soreness, stiffness Aggravating factors: weight bearing Relieving factors: rest  PRECAUTIONS: Fall  WEIGHT BEARING RESTRICTIONS: Yes WBAT  FALLS:  Has patient fallen in last 6 months? Yes. Number of falls knees gave way, and then neuropathy-- couldn't baance  LIVING ENVIRONMENT: Lives with: lives with friend Lives in: House/apartment Stairs: Yes: External: 1 steps; can use walker Has following equipment at home: Walker - 2 wheeled, BSC over toilet, shower chair  PATIENT GOALS: pain free walking, being able to travel without pain  NEXT MD VISIT: 11/07/24  OBJECTIVE:  Note: Objective measures were completed at Evaluation unless otherwise noted.  PATIENT SURVEYS:  LEFS  =15%  COGNITION: Overall cognitive status: Within functional limits for tasks assessed     SENSATION: Numbness in bilat feet  EDEMA:  Circumferential: 41cm R and 39 cm L  POSTURE: rounded shoulders  PALPATION: Edema noted in R LE   LOWER EXTREMITY ROM: Passive ROM Right eval Left eval 09/29/24 Right AROM  Hip flexion     Hip extension     Hip abduction     Hip adduction     Hip internal rotation     Hip external rotation     Knee flexion   89  Knee extension -10 Quad set with towel under foot  78 deg  AROM to 64 supine heel slide Lacking 5   Ankle dorsiflexion     Ankle plantarflexion     Ankle inversion     Ankle eversion      (Blank rows = not tested)  LOWER EXTREMITY MMT: NT due to pain today/ post surgical MMT Right eval Left eval  Hip flexion    Hip extension    Hip abduction    Hip adduction    Hip internal rotation    Hip external rotation    Knee flexion    Knee extension <3/5 --unable to lift fully against gravity   Ankle dorsiflexion    Ankle plantarflexion    Ankle inversion    Ankle eversion     (Blank rows = not tested)  FUNCTIONAL TESTS:  Sit<>stand x 2 reps with UE support slowed pace  GAIT: Distance walked: x 75 feet Assistive device utilized: Walker - 2 wheeled Level of assistance: SBA Comments: step to pattern Stair negotiation step to pattern with SBA x 2 steps   [facility] Adult PT Treatment:                                                DATE: 10/04/2024 Therapeutic Exercise: Seated gastroc stretch (long sitting) with strap 10x10 sec Self-myofascial release with two tennis balls in pillowcase Standing gastroc stretch at wall Manual Therapy: STM/TPR proximal gastroc (L>M) Scar massage along healed portion of incision Neuromuscular re-ed: Quad set  Small range SLR Seated TKE on bosu ball 2x10 Therapeutic Activity: Rocking on recumbent bike x 5 min + subjective intake Supine knee flexion AROM with orange PB Seated heel  slides  Walking without AD --> focus on heel strike, equal stride length  Standing knee flexion Modalities: Vaso (Rt) knee, 34 deg, med compression x 10 min    [facility] Adult PT Treatment:                                                DATE:  10/02/24 Therapeutic Exercise: Recumbent bike rocking x 3 minutes for warm up, no reistance, for ROM Supine Quad set Heel slide x 3 reps--painful on anterior surface SLR x 10 reps HS stretch with strap x 3 x 20 seconds  Prone Knee flexion AROM with passive overpressure Contract/relax HS Manual Therapy: Medial HS STM and gastroc STM Neuromuscular re-ed: Terminal knee extension into deflated ball for quad engagement x 5 reps x 3 seconds Therapeutic Activity: Standing  heel cord stretch R and L  heel raises x 10 reps with UE support toe raises x 10 reps marches knee flexion Gait: Gait activities with SPC (adjusted to correct height, then no device. Patient demonstrates good R heel strike with equal weight shift R and L sides Modalities: Vaso 34 degrees x 10 minutes R knee   [facility] Adult PT Treatment:                                                DATE: 09/29/24 Therapeutic Exercise: Recumbent bike rocking x 5 minutes no resistance for ROM  Seated knee flexion AAROM x 10 Seated knee extension AAROM x 10  HS stretch with strap 3 x 20 sec  Standing calf raise 2 x 10  Neuromuscular re-ed: SAQ 2 x 10  LAQ 2 x 10  TKE at wall with ball 2 x 10    PATIENT EDUCATION:  Education details: HEP update  Person educated: Patient Education method: Explanation, Demonstration, and Handouts Education comprehension: verbalized understanding, returned demonstration, and needs further education  HOME EXERCISE PROGRAM: Access Code: [email or web address] URL: [email or web address] Date: 09/29/2024 Prepared by: Lucie Meeter  Exercises - Supine Quad Set  - 3 x daily - 7 x weekly - 1 sets - 10 reps - 10 hold - Supine Active Straight Leg Raise  - 2 x  daily - 7 x weekly - 1 sets - 10 reps - Supine Heel Slide with Strap  - 3 x daily - 7 x weekly - 1 sets - 10 reps - 10 hold - Sidelying Hip Abduction  - 2 x daily - 7 x weekly - 1 sets - 10 reps - Seated Long Arc Quad  - 3 x daily - 7 x weekly - 1 sets - 10 reps - Seated Ankle Pumps  - 2 x daily - 7 x weekly - 1 sets - 10 reps - Heel Raises with Counter Support  - 2 x daily - 7 x weekly - 1 sets - 10 reps - Seated Knee Flexion Extension AAROM with Overpressure  - 2 x daily - 7 x weekly - 1 sets - 10 reps - 5 sec  hold  Access Code: [email or web address] URL: [email or web address] Date: 10/04/2024 Prepared by: Lamarr Price  Patient Education - Scar Massage   ASSESSMENT:  CLINICAL IMPRESSION: Significant tightness noted with palpitation along lateral proximal gastroc on Rt LE; tightness decreased with manual intervention and instruction on self-massage to perform at home. Instruction and review provided for scar massage along incision site; recommended patient defer lower portion of incision to allow for more healing time. Patient demonstrated good heel strike and equal stride length with gait, as well as improved knee flexion on swing phase. Patient will continue to benefit from skilled therapy to progress knee mobility and stability and functional deficits.   EVAL:  Patient is a 68 y.o. female who was seen today for  physical therapy evaluation and treatment for s/p R TKR. She presents with impairments in AROM, muscle strength, gait mechanics, transfers, and pain. PT to address deficits with goals to return to community ambulation mod indep.    OBJECTIVE IMPAIRMENTS: Abnormal gait, decreased activity tolerance, decreased balance, decreased mobility, decreased ROM, decreased strength, increased fascial restrictions, impaired flexibility, impaired tone, and pain.    GOALS: Goals reviewed with patient? Yes  SHORT TERM GOALS: Target date: 10/21/24  The patient will be indep with initial  HEP Baseline: initiated at eval Goal status: INITIAL  2.  The patient will improve PROM to 100 degrees R knee flexion  Baseline: see above Goal status: INITIAL  3.  The patient will improve quad set knee extension (towel under heel) to -5 degrees from full extension. Baseline:  see above Goal status: MET 10/02/24  LONG TERM GOALS: Target date: 11/20/2024   The patient will be indep with progression of HEP. Baseline:  initiated at eval Goal status: INITIAL  2.  The patient will improve LEFS by 30%  to demonstrate improved functional abilities. Baseline: see above Goal status: INITIAL  3.  The patient will negotiate 4 steps with reciprocal pattern mod indep. Baseline:  step to pattern Goal status: INITIAL  4.  The patient will imrpove AROM R knee to 110 degrees flexion. Baseline:  see above Goal status: INITIAL  5.  The patient will improve AROM R knee to full extension. Baseline:  see above Goal status: INITIAL  6.  The patient will ambulate without device mod indep. Baseline:  RW Goal status: INITIAL  PLAN:  PT FREQUENCY: 2x/week  PT DURATION: 8 weeks  PLANNED INTERVENTIONS: 97164- PT Re-evaluation, 97750- Physical Performance Testing, 97110-Therapeutic exercises, 97530- Therapeutic activity, [id number]- Neuromuscular re-education, 97535- Self Care, 02859- Manual therapy, [phone]- Gait training, [phone]- Aquatic Therapy, [phone]- Electrical stimulation (unattended), 97016- Vasopneumatic device, 20560 (1-2 muscles), 20561 (3+ muscles)- Dry Needling, Patient/Family education, Balance training, Stair training, Taping, Joint mobilization, Cryotherapy, and Moist heat  PLAN FOR NEXT SESSION: progress HEP to patient tolerance, continue to work on knee flexion during initial swing phase of gait as knee flexion ROM improves.   Lamarr GORMAN Price, PTA 10/04/24 1:30 PM

## 2024-10-09 DIAGNOSIS — R5383 Other fatigue: Secondary | ICD-10-CM | POA: Diagnosis not present

## 2024-10-09 DIAGNOSIS — E669 Obesity, unspecified: Secondary | ICD-10-CM | POA: Diagnosis not present

## 2024-10-09 DIAGNOSIS — Z6831 Body mass index (BMI) 31.0-31.9, adult: Secondary | ICD-10-CM | POA: Diagnosis not present

## 2024-10-09 DIAGNOSIS — M0579 Rheumatoid arthritis with rheumatoid factor of multiple sites without organ or systems involvement: Secondary | ICD-10-CM | POA: Diagnosis not present

## 2024-10-09 DIAGNOSIS — M1991 Primary osteoarthritis, unspecified site: Secondary | ICD-10-CM | POA: Diagnosis not present

## 2024-10-10 ENCOUNTER — Encounter (HOSPITAL_BASED_OUTPATIENT_CLINIC_OR_DEPARTMENT_OTHER): Payer: Self-pay

## 2024-10-10 ENCOUNTER — Ambulatory Visit: Admitting: Rehabilitative and Restorative Service Providers"

## 2024-10-10 ENCOUNTER — Other Ambulatory Visit: Payer: Self-pay

## 2024-10-10 ENCOUNTER — Emergency Department (HOSPITAL_BASED_OUTPATIENT_CLINIC_OR_DEPARTMENT_OTHER): Admission: EM | Admit: 2024-10-10 | Discharge: 2024-10-10 | Disposition: A | Discharge status: Home / Self Care

## 2024-10-10 ENCOUNTER — Ambulatory Visit: Payer: Self-pay

## 2024-10-10 DIAGNOSIS — R2689 Other abnormalities of gait and mobility: Secondary | ICD-10-CM | POA: Diagnosis present

## 2024-10-10 DIAGNOSIS — M25561 Pain in right knee: Secondary | ICD-10-CM | POA: Insufficient documentation

## 2024-10-10 DIAGNOSIS — M6281 Muscle weakness (generalized): Secondary | ICD-10-CM | POA: Insufficient documentation

## 2024-10-10 DIAGNOSIS — T7840XA Allergy, unspecified, initial encounter: Secondary | ICD-10-CM

## 2024-10-10 DIAGNOSIS — L500 Allergic urticaria: Secondary | ICD-10-CM | POA: Insufficient documentation

## 2024-10-10 DIAGNOSIS — Z7982 Long term (current) use of aspirin: Secondary | ICD-10-CM | POA: Diagnosis not present

## 2024-10-10 DIAGNOSIS — R21 Rash and other nonspecific skin eruption: Secondary | ICD-10-CM | POA: Diagnosis present

## 2024-10-10 DIAGNOSIS — G8929 Other chronic pain: Secondary | ICD-10-CM | POA: Insufficient documentation

## 2024-10-10 MED ORDER — DIPHENHYDRAMINE HCL 25 MG PO CAPS
25.0000 mg | ORAL_CAPSULE | Freq: Once | ORAL | Status: AC
  Administered 2024-10-10: 25 mg via ORAL
  Filled 2024-10-10: qty 1

## 2024-10-10 MED ORDER — FAMOTIDINE 20 MG PO TABS
20.0000 mg | ORAL_TABLET | Freq: Once | ORAL | Status: AC
  Administered 2024-10-10: 20 mg via ORAL
  Filled 2024-10-10: qty 1

## 2024-10-10 MED ORDER — CETIRIZINE HCL 10 MG PO TABS: 10.0000 mg | ORAL_TABLET | Freq: Every day | ORAL | 0 refills | Status: DC

## 2024-10-10 MED ORDER — PREDNISONE 50 MG PO TABS: 50.0000 mg | ORAL_TABLET | Freq: Every day | ORAL | 0 refills | Status: AC

## 2024-10-10 MED ORDER — PREDNISONE 50 MG PO TABS
60.0000 mg | ORAL_TABLET | Freq: Once | ORAL | Status: AC
  Administered 2024-10-10: 60 mg via ORAL
  Filled 2024-10-10: qty 1

## 2024-10-10 NOTE — ED Triage Notes (Signed)
 Patient believes she is having an allergic reaction to medication that she is taking (Oxycodone  due to knee surgery, Ozempic  on Sunday).  Monday patient began vomiting all throughout the day. Today she began having hives and rash that has progessed.  Patient took benadryl  about 40 minutes ago that helped with lip swelling.  Patient reports lumps all over head, back, trunk, and legs.

## 2024-10-10 NOTE — ED Provider Notes (Signed)
 [facility] Provider Note   CSN: [id number] Arrival date & time: 10/10/24  2123    Patient presents with allergic reaction   Alexis Mcneil is a 68 y.o. female here for evaluation of rash and allergic reaction.  Patient states she was previously on Ozempic  however went off due to having knee surgery.  She started back on Sunday.  She took a dose on Sunday had some nausea and vomiting.  Today she has been able to eat and drink without any difficulty however earlier this morning she noted diffuse what she thought was urticaria.  Pruritic in nature.  Initially started at her underwear line and bra line she thought was possibly due to detergent however throughout the day developed rash to her bilateral lower legs, posterior trunk, scalp.  Friend thought maybe her lips look swollen subsequently recommend coming here.  She took Benadryl  about 4 to 5 hours PTA.  Helps with the swelling and the pruritus.  No further nausea vomiting today.  No chest pain, abdominal pain, dysuria or hematuria.  No sensation of throat closing, angioedema.  No known insect bite, exposure to bedbugs.  Blood sugars typically well controlled. CBG 92 today   HPI     Prior to Admission medications   Medication Sig Start Date End Date Taking? Authorizing Provider  cetirizine (ZYRTEC ALLERGY ) 10 MG tablet Take 1 tablet (10 mg total) by mouth daily. 10/10/24  Yes Nabria Nevin A, PA-C  predniSONE  (DELTASONE ) 50 MG tablet Take 1 tablet (50 mg total) by mouth daily for 4 days. 10/10/24 10/14/24 Yes Yamileth Hayse A, PA-C  acetaminophen  (TYLENOL ) 500 MG tablet Take 2 tablets (1,000 mg total) by mouth every 8 (eight) hours as needed. 09/20/24 10/20/24  Renae Bernarda HERO, PA-C  allopurinol  (ZYLOPRIM ) 300 MG tablet Take 0.5 tablets (150 mg total) by mouth every evening. 09/18/24   [name], MD  AMBULATORY NON FORMULARY MEDICATION Medication Name: CPAP machine and supplies  with humidifier.  Please set her CPAP to 9 cm water pressure. Then download after one week so can review her apneas on 9 cm water pressure.  [facility]. Dx. OSA G47.33 12/08/16   [name], MD  aspirin  EC 81 MG tablet Take 1 tablet (81 mg total) by mouth 2 (two) times daily for 28 days. Swallow whole. 09/21/24 10/19/24  Renae Bernarda HERO, PA-C  atorvastatin  (LIPITOR) 40 MG tablet TAKE 1 TABLET BY MOUTH IN THE  EVENING AT 6 PM 08/07/24   [name], MD  Blood Glucose Monitoring Suppl (FREESTYLE LITE) [facility]  11/06/19   [provider]  chlorhexidine  (HIBICLENS ) 4 % external liquid Apply 15 mLs (1 Application total) topically as directed for 30 doses. Use as directed daily for 5 days every other week for 6 weeks. 09/20/24   Renae Bernarda HERO, PA-C  chlorthalidone  (HYGROTON ) 25 MG tablet Take 1 tablet (25 mg total) by mouth daily. Patient not taking: Reported on 09/29/2024 07/12/24   [name], MD  DULoxetine  (CYMBALTA ) 60 MG capsule TAKE 1 CAPSULE BY MOUTH DAILY 11/12/23 11/11/24  [name], MD  gabapentin  (NEURONTIN ) 100 MG capsule Take 1 capsule (100 mg total) by mouth 3 (three) times daily. 08/07/24   [name], MD  glucose blood test strip Freestyle Lite test strips.  FOR CHECKING BLOOD SUGAR ONCE DAILY 07/03/22   [name], MD  Lancets Misc. (ACCU-CHEK FASTCLIX LANCET) KIT Use to check blood sugar once daily.  Dx code: E11.69 02/16/17   [name], MD  levothyroxine  (SYNTHROID ) 137 MCG tablet Take 1 tablet (137 mcg total) by mouth daily before breakfast. 09/20/24   [name], MD  losartan  (COZAAR ) 100 MG tablet TAKE 1 TABLET BY MOUTH ONCE  DAILY 08/21/24 08/21/25  [name], MD  MAGNESIUM  OXIDE PO Take 400 mg by mouth in the morning.    [provider]  mupirocin  ointment (BACTROBAN ) 2 % Place 1 Application into the nose 2 (two) times daily for 60 doses. Use as directed 2 times daily  for 5 days every other week for 6 weeks. 09/20/24 10/20/24  Renae Bernarda HERO, PA-C  pantoprazole  (PROTONIX ) 40 MG tablet TAKE 1 TABLET BY MOUTH DAILY 02/28/24   Metheney, Catherine D, MD  polyethylene glycol (MIRALAX ) 17 g packet Take 17 g by mouth daily. 09/20/24   Renae Bernarda HERO, PA-C  potassium chloride  SA (KLOR-CON  M) 20 MEQ tablet Take 2 tablets (40 mEq total) by mouth daily. Patient taking differently: Take 40 mEq by mouth 2 (two) times daily. 04/27/24   [name], MD  Pyridoxine HCl (B-6 PO) Take 1 tablet by mouth in the morning.    [provider]  Semaglutide , 2 MG/DOSE, 8 MG/3ML SOPN Inject 2 mg as directed once a week. Patient not taking: Reported on 09/29/2024 10/22/22   [name], MD    Allergies: Gabapentin , Niacin, and Niacin and related    Review of Systems  Constitutional: Negative.   HENT: Negative.    Respiratory: Negative.    Cardiovascular: Negative.   Gastrointestinal:  Positive for nausea (resolved) and vomiting (resolved).  Genitourinary: Negative.   Musculoskeletal: Negative.   Skin:  Positive for rash.  Neurological: Negative.   All other systems reviewed and are negative.   Updated Vital Signs BP (!) 149/92 (BP Location: Right Arm)   Pulse 85   Temp 98.2 F (36.8 C) (Oral)   Resp 16   Ht 5' 5 (1.651 m)   Wt 85.7 kg   SpO2 95%   BMI 31.45 kg/m   Physical Exam Vitals and nursing note reviewed.  Constitutional:      General: She is not in acute distress.    Appearance: She is well-developed. She is not ill-appearing, toxic-appearing or diaphoretic.  HENT:     Head: Normocephalic and atraumatic.     Jaw: There is normal jaw occlusion.     Comments: No drooling, dysphagia or trismus.  No angioedema.    Nose: Nose normal.     Mouth/Throat:     Lips: Pink.     Mouth: Mucous membranes are moist.     Pharynx: Oropharynx is clear. Uvula midline.     Comments: Tongue midline.  Posterior pharynx clear Eyes:      Pupils: Pupils are equal, round, and reactive to light.  Neck:     Trachea: Phonation normal.     Comments: Full range of motion Cardiovascular:     Rate and Rhythm: Normal rate.     Pulses: Normal pulses.     Heart sounds: Normal heart sounds.  Pulmonary:     Effort: Pulmonary effort is normal. No respiratory distress.     Breath sounds: Normal breath sounds and air entry.  Abdominal:     General: Bowel sounds are normal. There is no distension.     Palpations: Abdomen is soft.     Tenderness: There is no abdominal tenderness. There is no right CVA tenderness, left CVA  tenderness, guarding or rebound.  Musculoskeletal:        General: Normal range of motion.     Cervical back: Full passive range of motion without pain and normal range of motion.  Skin:    General: Skin is warm and dry.     Comments: Urticaria as well as erythematous macules anterior posterior trunk as well as scalp and posterior legs.  No fluctuance, induration, vesicles, bulla, desquamated skin, petechiae, purpura.  No rash to palms or soles  Neurological:     General: No focal deficit present.     Mental Status: She is alert.  Psychiatric:        Mood and Affect: Mood normal.    (all labs ordered are listed, but only abnormal results are displayed) Labs Reviewed - No data to display  EKG: None  Radiology: No results found.   Procedures   Medications Ordered in the ED  predniSONE  (DELTASONE ) tablet 60 mg (60 mg Oral Given 10/10/24 2223)  famotidine (PEPCID) tablet 20 mg (20 mg Oral Given 10/10/24 2223)  diphenhydrAMINE  (BENADRYL ) capsule 25 mg (25 mg Oral Given 10/10/24 682)   68 year old here for evaluation of rash and facial swelling.  Sounds like restarted her Ozempic  medication after being off for a month developed some nausea and vomiting over 24 hours.  Today feels improved able to eat and drink without difficulty however developed diffuse rash initially started at beltline, extended down her legs  up trunk and onto scalp.  She denies any known insect bite, bedbug exposure.  Friend thought she had some lower lip swelling.  Here she has no evidence of angioedema.  Her posterior pharynx is clear.  No evidence of anaphylaxis.  Offered check blood work, IV fluids given her nausea and vomiting however patient declines at this time.  Will treat for possible allergic reaction.  Patient reassessed.  No angioedema.  Still some urticaria however no evidence of anaphylaxis.  Will send her home with short course of steroids, antihistamine.  She has follow-up with PCP tomorrow.  She is tolerating p.o. intake here.  She will check her blood sugars more frequently at home, stop taking steroids blood sugars greater than 200.  She will return for any new or worsening symptoms.                                  Medical Decision Making Amount and/or Complexity of Data Reviewed Independent Historian: friend External Data Reviewed: labs, radiology and notes.  Risk OTC drugs. Prescription drug management. Decision regarding hospitalization. Diagnosis or treatment significantly limited by social determinants of health.        Final diagnoses:  Rash  Allergic reaction, initial encounter    ED Discharge Orders          Ordered    predniSONE  (DELTASONE ) 50 MG tablet  Daily        10/10/24 2303    cetirizine (ZYRTEC ALLERGY ) 10 MG tablet  Daily        10/10/24 2303               Avier Jech A, PA-C 10/10/24 2315    Neysa Caron PARAS, DO 10/10/24 2340

## 2024-10-10 NOTE — Telephone Encounter (Signed)
 FYI Only or Action Required?: FYI only for provider: appointment scheduled on 10/11/2024.  Patient was last seen in primary care on 08/07/2024 by [name], MD.  Called Nurse Triage reporting Medication Reaction.  Symptoms began several days ago.  Interventions attempted: Nothing.  Symptoms are: unchanged.  Triage Disposition: See PCP When Office is Open (Within 3 Days)  Patient/caregiver understands and will follow disposition?:   Copied from CRM #[id number]. Topic: Clinical - Red Word Triage >> Oct 10, 2024  2:32 PM Mercer PEDLAR wrote: Red Word that prompted transfer to Nurse Triage: Patient stated that on 10/08/24 she took Semaglutide , 2 MG/DOSE, (OZEMPIC , 2 MG/DOSE,) 8 MG/3ML SOPN pen and she had nausea and vomiting the next day on 10/09/24 as well as a rash. Reason for Disposition  Localized rash present > 7 days  Answer Assessment - Initial Assessment Questions Welt and hives around underwear line, legs and buttocks.  1. VOMITING SEVERITY: How many times have you vomited in the past 24 hours?      4 times yesterday. None today 2. ONSET: When did the vomiting begin?      Sunday night 3. FLUIDS: What fluids or food have you vomited up today? Have you been able to keep any fluids down?     Kept ginger ale, toast and egg down from this morning. 4. ABDOMEN PAIN: Are your having any abdomen pain? If Yes : How bad is it and what does it feel like? (e.g., crampy, dull, intermittent, constant)      Denies 8. HYDRATION STATUS: Any signs of dehydration? (e.g., dry mouth [not only dry lips], too weak to stand) When did you last urinate?     States she always feels dehydrated  Answer Assessment - Initial Assessment Questions 1. APPEARANCE of RASH: What does the rash look like? (e.g., blisters, dry flaky skin, red spots, redness, sores)     Welts 2. LOCATION: Where is the rash located?      Lower back and buttocks 3. NUMBER: How many spots are there?       50  4. SIZE: How big are the spots? (e.g., inches, cm; or compare to size of pinhead, tip of pen, eraser, pea)      Various sizes up to the size of a nail 5. ONSET: When did the rash start?      Sunday night 6. ITCHING: Does the rash itch? If Yes, ask: How bad is the itch?  (Scale 0-10; or none, mild, moderate, severe)     Itches if touched  Protocols used: Vomiting-A-AH, Rash or Redness - Localized-A-AH

## 2024-10-10 NOTE — Discharge Instructions (Signed)
 It was a pleasure taking care of you here today  As we discussed we have started you on steroids and an antihistamine Zyrtec .  You may also take 25 mg Benadryl  every 8 hours as needed.  Make sure to keep a close eye on your symptoms.  If you notice worsening facial swelling, difficulty speaking, sensation of throat closing you need to be seen in the emergency department right away.  Keep your follow-up appoint with your primary care doctor tomorrow.  Check your blood sugars more frequently at home.  If blood sugar greater than 200 stop taking the prednisone   Return for new or worsening symptoms

## 2024-10-10 NOTE — ED Notes (Signed)
 Pt. Reports she feels she had an allergic reaction to Ozempic  .. Pt. Said she is on Oxycodone  as well for 3 wks also due to knee replacement.

## 2024-10-10 NOTE — Therapy (Signed)
 OUTPATIENT PHYSICAL THERAPY LOWER EXTREMITY TREATMENT  Patient Name: Alexis Mcneil MRN: [id number] DOB:Dec 23, 1955, 68 y.o., female Today's Date: 10/10/2024  END OF SESSION:  PT End of Session - 10/10/24 1154     Visit Number 6    Number of Visits 16    Date for Recertification  11/21/24    Authorization Type Medicare Part A and B    Progress Note Due on Visit 10    PT Start Time 1147    PT Stop Time 1245    PT Time Calculation (min) 58 min    Activity Tolerance Patient tolerated treatment well    Behavior During Therapy [facility] for tasks assessed/performed          Past Medical History:  Diagnosis Date   Anemia    Arthritis    KNEE,SHOULDERS   Cataract January 2021   Eye appt visit   Diabetes mellitus without complication (HCC)    GERD (gastroesophageal reflux disease)    Heart murmur    Hyperlipidemia    Hypertension    IFG (impaired fasting glucose)    Neuromuscular disorder (HCC)    NEUROPATHY FEET   OSA (obstructive sleep apnea)    CPAP   Sleep apnea    CPAP   Thyroid  disease    Past Surgical History:  Procedure Laterality Date   COLONOSCOPY     endometrial polyp     January 2014   EYE SURGERY  1988   Cyst on eye removed   mouth palate surgery repair  1961   large tear   ROOT CANEL     2 WEEKS AGO   rt knee surgery arthroscopy  2009   torn meniscus/medial   torn ligament shoulder  1975   repaired   TOTAL KNEE ARTHROPLASTY Right 09/20/2024   Procedure: ARTHROPLASTY, KNEE, TOTAL;  Surgeon: Edna Toribio LABOR, MD;  Location: [facility];  Service: Orthopedics;  Laterality: Right;   Patient Active Problem List   Diagnosis Date Noted   Localized osteoarthritis of right knee 09/20/2024   B12 deficiency 08/17/2024   Primary osteoarthritis of right knee 12/04/2021   CKD (chronic kidney disease) stage 3, GFR 30-59 ml/min (HCC) 11/12/2021   Pre-ulcerative corn or callous 05/20/2020   Excessive sweating 05/20/2020   Gout 03/07/2018   Hereditary and  idiopathic peripheral neuropathy 09/24/2015   Trochanteric bursitis of both hips 10/08/2014   Lumbar spinal stenosis 09/06/2014   Right knee pain 09/06/2014   Obstructive sleep apnea 09/26/2010   Left rotator cuff tear 03/18/2010   PARESTHESIA, HANDS 03/18/2010   Diabetes mellitus type 2 in obese 02/07/2008   Hypothyroidism 01/06/2007   Hyperlipidemia 01/06/2007   ANEMIA NOS 01/06/2007   HYPERTENSION, BENIGN ESSENTIAL 01/06/2007    PCP: Dorothyann Byars, MD REFERRING PROVIDER: Toribio Edna, MD REFERRING DIAG:  Diagnosis  [phone] (ICD-10-CM) - Presence of right artificial knee joint   THERAPY DIAG:  Acute pain of right knee  Other abnormalities of gait and mobility  Muscle weakness (generalized)  Chronic pain of right knee  Rationale for Evaluation and Treatment: Rehabilitation  ONSET DATE: 09/20/2024  SUBJECTIVE:  SUBJECTIVE STATEMENT: The patient stood on Thanksgiving for a couple of hours and noted significant swelling on the ride home. She had to lay down and elevate her leg in the car. The swelling reduced. The medial knee is sore at times to the touch.  The patient reports that she saw rheumatologist and she has 2 markers indicative of rheumatoid- she may starts meds to reduce  joint pain.   EVAL: The patient reports nerve block wore off yesterday. She c/o tightness sensation in stitching. She is using RW at home.   PERTINENT HISTORY: Diabetes Neuropathy in feet  Paresthesia hands HTN PAIN:  Are you having pain? Yes: NPRS scale: 1/10 Pain location: R knee Pain description: soreness, stiffness Aggravating factors: weight bearing Relieving factors: rest  PRECAUTIONS: Fall  WEIGHT BEARING RESTRICTIONS: Yes WBAT  FALLS:  Has patient fallen in last 6 months? Yes. Number of falls knees gave way, and then neuropathy-- couldn't baance  LIVING ENVIRONMENT: Lives with: lives with friend Lives in: House/apartment Stairs: Yes: External: 1 steps; can use  walker Has following equipment at home: Walker - 2 wheeled, BSC over toilet, shower chair  PATIENT GOALS: pain free walking, being able to travel without pain  NEXT MD VISIT: 11/07/24  OBJECTIVE:  Note: Objective measures were completed at Evaluation unless otherwise noted.  PATIENT SURVEYS:  LEFS =15%  COGNITION: Overall cognitive status: Within functional limits for tasks assessed     SENSATION: Numbness in bilat feet  EDEMA:  Circumferential: 41cm R and 39 cm L  POSTURE: rounded shoulders  PALPATION: Edema noted in R LE   LOWER EXTREMITY ROM: Passive ROM Right eval Left eval 09/29/24 Right AROM Right 10/10/24  Hip flexion      Hip extension      Hip abduction      Hip adduction      Hip internal rotation      Hip external rotation      Knee flexion   89 108 AROM supine heel slide  Knee extension -10 Quad set with towel under foot  78 deg  AROM to 64 supine heel slide Lacking 5  -6 degrees   Ankle dorsiflexion      Ankle plantarflexion      Ankle inversion      Ankle eversion       (Blank rows = not tested)  LOWER EXTREMITY MMT: NT due to pain today/ post surgical MMT Right eval Left eval  Hip flexion    Hip extension    Hip abduction    Hip adduction    Hip internal rotation    Hip external rotation    Knee flexion    Knee extension <3/5 --unable to lift fully against gravity   Ankle dorsiflexion    Ankle plantarflexion    Ankle inversion    Ankle eversion     (Blank rows = not tested)  FUNCTIONAL TESTS:  Sit<>stand x 2 reps with UE support slowed pace  GAIT: Distance walked: x 75 feet Assistive device utilized: Walker - 2 wheeled Level of assistance: SBA Comments: step to pattern Stair negotiation step to pattern with SBA x 2 steps   [facility] Adult PT Treatment:                                                DATE: 10/10/24 Therapeutic Exercise: Recumbent bike x 6 minutes with full revolutions SLR with ER x 12 reps x 2 sets Supine heel  slides x 8 reps Seated knee flexion blue theraband x 12 reps Manual Therapy: STM gastrocs with IASTM Scar massage along healed portion of incision Therapeutic Activity: Sit<>stand x 10 reps  Step ups to 1 foam pad latrally and anterior/posterior Standing 4 lateral step ups x 10 reps Standing 4  anterior up/up, down/down x 10 reps Isometrics for HS in sitting against physioball x 10 reps Isometrics for quad engagement pressing into physioball x 4 reps-- c/o pain in medial patellar Gait: Gait without device x 300 feet  Backwards walking x 10 reps Slow marching working on balance and stability Modalities: Vaso (Rt) knee, 34 deg, med compression x 10 min   [facility] Adult PT Treatment:                                                DATE: 10/04/2024 Therapeutic Exercise: Seated gastroc stretch (long sitting) with strap 10x10 sec Self-myofascial release with two tennis balls in pillowcase Standing gastroc stretch at wall Manual Therapy: STM/TPR proximal gastroc (L>M) Scar massage along healed portion of incision Neuromuscular re-ed: Quad set  Small range SLR Seated TKE on bosu ball 2x10 Therapeutic Activity: Rocking on recumbent bike x 5 min + subjective intake Supine knee flexion AROM with orange PB Seated heel slides  Walking without AD --> focus on heel strike, equal stride length  Standing knee flexion Modalities: Vaso (Rt) knee, 34 deg, med compression x 10 min    [facility] Adult PT Treatment:                                                DATE: 10/02/24 Therapeutic Exercise: Recumbent bike rocking x 3 minutes for warm up, no reistance, for ROM Supine Quad set Heel slide x 3 reps--painful on anterior surface SLR x 10 reps HS stretch with strap x 3 x 20 seconds  Prone Knee flexion AROM with passive overpressure Contract/relax HS Manual Therapy: Medial HS STM and gastroc STM Neuromuscular re-ed: Terminal knee extension into deflated ball for quad engagement x 5 reps x 3  seconds Therapeutic Activity: Standing  heel cord stretch R and L  heel raises x 10 reps with UE support toe raises x 10 reps marches knee flexion Gait: Gait activities with SPC (adjusted to correct height, then no device. Patient demonstrates good R heel strike with equal weight shift R and L sides Modalities: Vaso 34 degrees x 10 minutes R knee   PATIENT EDUCATION:  Education details: HEP update  Person educated: Patient Education method: Explanation, Demonstration, and Handouts Education comprehension: verbalized understanding, returned demonstration, and needs further education  HOME EXERCISE PROGRAM: Access Code: [email or web address] URL: [email or web address] Date: 09/29/2024 Prepared by: Lucie Meeter  Exercises - Supine Quad Set  - 3 x daily - 7 x weekly - 1 sets - 10 reps - 10 hold - Supine Active Straight Leg Raise  - 2 x daily - 7 x weekly - 1 sets - 10 reps - Supine Heel Slide with Strap  - 3 x daily - 7 x weekly - 1 sets - 10 reps - 10 hold - Sidelying Hip Abduction  - 2 x daily - 7 x weekly - 1 sets - 10 reps - Seated Long Arc Quad  - 3 x daily - 7 x weekly - 1 sets - 10 reps - Seated Ankle Pumps  - 2 x daily - 7 x weekly - 1 sets - 10 reps - Heel Raises with Counter Support  - 2 x daily - 7 x weekly - 1 sets -  10 reps - Seated Knee Flexion Extension AAROM with Overpressure  - 2 x daily - 7 x weekly - 1 sets - 10 reps - 5 sec  hold  Access Code: [email or web address] URL: [email or web address] Date: 10/04/2024 Prepared by: Lamarr Price  Patient Education - Scar Massage  ASSESSMENT: CLINICAL IMPRESSION: The patient is progressing AROM per measurements today. She has met all STGs ahead of schedule today. Her calf muscle feels improved since STM last session. She continues with mild tightness medial aspect and serratus today. Plan to continue to work to [employer].   EVAL:  Patient is a 68 y.o. female who was seen today for physical therapy evaluation and  treatment for s/p R TKR. She presents with impairments in AROM, muscle strength, gait mechanics, transfers, and pain. PT to address deficits with goals to return to community ambulation mod indep.    OBJECTIVE IMPAIRMENTS: Abnormal gait, decreased activity tolerance, decreased balance, decreased mobility, decreased ROM, decreased strength, increased fascial restrictions, impaired flexibility, impaired tone, and pain.    GOALS: Goals reviewed with patient? Yes  SHORT TERM GOALS: Target date: 10/21/24  The patient will be indep with initial HEP Baseline: initiated at eval Goal status: MET on 10/10/24  2.  The patient will improve PROM to 100 degrees R knee flexion  Baseline: see above Goal status: MET on 10/10/24  3.  The patient will improve quad set knee extension (towel under heel) to -5 degrees from full extension. Baseline:  see above Goal status: MET 10/02/24  LONG TERM GOALS: Target date: 11/20/2024   The patient will be indep with progression of HEP. Baseline:  initiated at eval Goal status: INITIAL  2.  The patient will improve LEFS by 30%  to demonstrate improved functional abilities. Baseline: see above Goal status: INITIAL  3.  The patient will negotiate 4 steps with reciprocal pattern mod indep. Baseline:  step to pattern Goal status: INITIAL  4.  The patient will imrpove AROM R knee to 110 degrees flexion. Baseline:  see above Goal status: INITIAL  5.  The patient will improve AROM R knee to full extension. Baseline:  see above Goal status: INITIAL  6.  The patient will ambulate without device mod indep. Baseline:  RW Goal status: INITIAL  PLAN:  PT FREQUENCY: 2x/week  PT DURATION: 8 weeks  PLANNED INTERVENTIONS: 97164- PT Re-evaluation, 97750- Physical Performance Testing, 97110-Therapeutic exercises, 97530- Therapeutic activity, [id number]- Neuromuscular re-education, 97535- Self Care, 02859- Manual therapy, [phone]- Gait training, [phone]- Aquatic Therapy,  [phone]- Electrical stimulation (unattended), 97016- Vasopneumatic device, 20560 (1-2 muscles), 20561 (3+ muscles)- Dry Needling, Patient/Family education, Balance training, Stair training, Taping, Joint mobilization, Cryotherapy, and Moist heat  PLAN FOR NEXT SESSION: progress HEP to patient tolerance, gait training, balance, knee flexion ROM and continued quad engagement.   Deleah Tison, PT 10/10/24 12:44 PM

## 2024-10-10 NOTE — ED Notes (Signed)
 Pt. Is alert and oriented with no resp. Distress noted.  Pt. Has some noted lip swelling on the L upper lip.  Pt. Reports she started with vomiting on Sunday night after she took ozempic  on Sun. Morning.  Pt. Has had rash started yesterday and it got worse today and with bumps in her head.

## 2024-10-11 ENCOUNTER — Ambulatory Visit: Admitting: Family Medicine

## 2024-10-11 ENCOUNTER — Encounter: Payer: Self-pay | Admitting: Family Medicine

## 2024-10-11 VITALS — BP 124/69 | HR 85 | Ht 65.0 in | Wt 185.0 lb

## 2024-10-11 DIAGNOSIS — E538 Deficiency of other specified B group vitamins: Secondary | ICD-10-CM

## 2024-10-11 DIAGNOSIS — N1831 Chronic kidney disease, stage 3a: Secondary | ICD-10-CM

## 2024-10-11 DIAGNOSIS — M0580 Other rheumatoid arthritis with rheumatoid factor of unspecified site: Secondary | ICD-10-CM | POA: Insufficient documentation

## 2024-10-11 DIAGNOSIS — T50905A Adverse effect of unspecified drugs, medicaments and biological substances, initial encounter: Secondary | ICD-10-CM | POA: Diagnosis not present

## 2024-10-11 DIAGNOSIS — E1122 Type 2 diabetes mellitus with diabetic chronic kidney disease: Secondary | ICD-10-CM

## 2024-10-11 MED ORDER — CYANOCOBALAMIN 1000 MCG/ML IJ SOLN: 1000.0000 ug | Freq: Once | INTRAMUSCULAR | Status: DC

## 2024-10-11 MED ORDER — CYANOCOBALAMIN 1000 MCG/ML IJ SOLN: 1000.0000 ug | INTRAMUSCULAR | Status: DC

## 2024-10-11 NOTE — Progress Notes (Signed)
 B12 injection given at OV today. Injection  tolerated well given in RD.  Pt reports no negative side effects from medication. Denies any dizziness, chest pain or palpitations, and no GI problems. She will RTC in 14 days for next injection

## 2024-10-11 NOTE — Progress Notes (Signed)
 Established Patient Office Visit  Patient ID: Alexis Mcneil, female    DOB: August 08, 1956  Age: 68 y.o. MRN: [id number] PCP: [name], MD  Chief Complaint  Patient presents with   Rash   B12 Injection    Subjective:     HPI  Discussed the use of AI scribe software for clinical note transcription with the patient, who gave verbal consent to proceed.  History of Present Illness Alexis Mcneil is a 68 year old female who presents with a possible allergic reaction following Ozempic  administration.  Adverse reaction to ozempic  - First dose of Ozempic  administered on Sunday (after not using for about 3 week b/o surgery) - Developed queasiness and severe headache after administration - Persistent vomiting throughout the night and following day, triggered even by small sips of water - Emesis initially clear, later yellow and bile-like - Able to tolerate ginger ale and a banana by Monday night  Cutaneous and soft tissue swelling - On Tuesday, developed swelling and bumps on scalp, face, and back - Progressive worsening of swelling throughout the day - Significant swelling of lips and jowls - Received treatment in emergency room with prednisone  and antihistamine - Prescribed prednisone  and Zyrtec for ongoing management - Swelling has since subsided - no resp sxs  Knee pain and swelling - History of knee surgery - Currently participating in physical therapy with good progress - Experienced significant calf swelling after prolonged standing during Thanksgiving - Calf swelling resolved with elevation  Rheumatologic symptoms - Elevated rheumatoid factor during recent knee flare - Rheumatologist recommended initiation of medication for rheumatoid arthritis - Currently taking meloxicam   Diabetes mellitus - Managing diabetes      ROS    Objective:     BP 124/69   Pulse 85   Ht 5' 5 (1.651 m)   Wt 185 lb (83.9 kg)   SpO2 100%   BMI 30.79 kg/m      Physical Exam Vitals reviewed.  Constitutional:      Appearance: Normal appearance.  HENT:     Head: Normocephalic.  Pulmonary:     Effort: Pulmonary effort is normal.  Skin:    Comments: Erythematous maculopapules scattered around the left hip in horizontal pattern.  A few on right side/hip  Neurological:     Mental Status: She is alert and oriented to person, place, and time.  Psychiatric:        Mood and Affect: Mood normal.        Behavior: Behavior normal.      No results found for any visits on 10/11/24.    The 10-year ASCVD risk score (Arnett DK, et al., 2019) is: 17.7%    Assessment & Plan:   Problem List Items Addressed This Visit       Endocrine   Type 2 diabetes mellitus with diabetic chronic kidney disease (HCC)     Musculoskeletal and Integument   Polyarthritis with positive rheumatoid factor (HCC)     Other   B12 deficiency - Primary   Relevant Medications   cyanocobalamin  (VITAMIN B12) injection 1,000 mcg   Other Visit Diagnoses       Adverse effect of drug, initial encounter           Assessment and Plan Assessment & Plan Adverse reaction to semaglutide  (Ozempic ) Recent adverse reaction with queasiness, headache, vomiting, and facial swelling after initial dose. Differential includes allergic reaction versus medication side effect. Decision to retry medication at a lower dose to assess  tolerance. - Retry semaglutide  at 0.25 mg dose on Sunday if symptoms have resolved. - Keep Benadryl  on hand for potential allergic reaction. - Consider prednisone  if severe reaction recurs.  Rheumatoid arthritis, possible  Positive rheumatoid factor with recent knee flare and elevated inflammatory markers. Rheumatology discussed methotrexate and folic acid  for autoimmune management. Decision to delay methotrexate due to immunosuppression and kidney concerns. - Continue meloxicam  for pain management. - Monitor for signs of rheumatoid flare.   Type 2  diabetes mellitus Discussed potential impact of prednisone  on blood glucose levels. - Monitor blood glucose levels more frequently if prednisone  is used.  General Health Maintenance Scheduled B12 injection. - Administered B12 injection today.   Return in about 2 weeks (around 10/25/2024) for b12 injection.    Dorothyann Byars, MD [facility] at [facility]

## 2024-10-12 ENCOUNTER — Ambulatory Visit

## 2024-10-12 DIAGNOSIS — M25561 Pain in right knee: Secondary | ICD-10-CM

## 2024-10-12 NOTE — Therapy (Addendum)
 OUTPATIENT PHYSICAL THERAPY LOWER EXTREMITY TREATMENT  Patient Name: Alexis Mcneil MRN: [id number] DOB:1956-09-29, 68 y.o., female Today's Date: 10/12/2024  END OF SESSION:  PT End of Session - 10/12/24 1147     Visit Number 7    Number of Visits 16    Date for Recertification  11/21/24    Authorization Type Medicare Part A and B    Progress Note Due on Visit 10    PT Start Time 1147    PT Stop Time 1235    PT Time Calculation (min) 48 min    Activity Tolerance Patient tolerated treatment well    Behavior During Therapy [facility] for tasks assessed/performed           Past Medical History:  Diagnosis Date   Anemia    Arthritis    KNEE,SHOULDERS   Cataract January 2021   Eye appt visit   Diabetes mellitus without complication (HCC)    GERD (gastroesophageal reflux disease)    Heart murmur    Hyperlipidemia    Hypertension    IFG (impaired fasting glucose)    Neuromuscular disorder (HCC)    NEUROPATHY FEET   OSA (obstructive sleep apnea)    CPAP   Sleep apnea    CPAP   Thyroid  disease    Past Surgical History:  Procedure Laterality Date   COLONOSCOPY     endometrial polyp     January 2014   EYE SURGERY  1988   Cyst on eye removed   mouth palate surgery repair  1961   large tear   ROOT CANEL     2 WEEKS AGO   rt knee surgery arthroscopy  2009   torn meniscus/medial   torn ligament shoulder  1975   repaired   TOTAL KNEE ARTHROPLASTY Right 09/20/2024   Procedure: ARTHROPLASTY, KNEE, TOTAL;  Surgeon: Edna Toribio LABOR, MD;  Location: [facility];  Service: Orthopedics;  Laterality: Right;   Patient Active Problem List   Diagnosis Date Noted   Polyarthritis with positive rheumatoid factor (HCC) 10/11/2024   Localized osteoarthritis of right knee 09/20/2024   B12 deficiency 08/17/2024   Primary osteoarthritis of right knee 12/04/2021   CKD (chronic kidney disease) stage 3, GFR 30-59 ml/min (HCC) 11/12/2021   Pre-ulcerative corn or callous 05/20/2020    Excessive sweating 05/20/2020   Gout 03/07/2018   Hereditary and idiopathic peripheral neuropathy 09/24/2015   Trochanteric bursitis of both hips 10/08/2014   Lumbar spinal stenosis 09/06/2014   Right knee pain 09/06/2014   Obstructive sleep apnea 09/26/2010   Left rotator cuff tear 03/18/2010   PARESTHESIA, HANDS 03/18/2010   Type 2 diabetes mellitus with diabetic chronic kidney disease (HCC) 02/07/2008   Hypothyroidism 01/06/2007   Hyperlipidemia 01/06/2007   ANEMIA NOS 01/06/2007   HYPERTENSION, BENIGN ESSENTIAL 01/06/2007    PCP: Dorothyann Byars, MD REFERRING PROVIDER: Toribio Edna, MD REFERRING DIAG:  Diagnosis  [phone] (ICD-10-CM) - Presence of right artificial knee joint   THERAPY DIAG:  Acute pain of right knee  Rationale for Evaluation and Treatment: Rehabilitation  ONSET DATE: 09/20/2024  SUBJECTIVE:  SUBJECTIVE STATEMENT: Pt reports doctor's are going to put her back on Ozempic  she had to stop taking because of surgery, starting with low doses. Still takes oxycodone  at night for pain. Right knee feels good, a little sore.   EVAL: The patient reports nerve block wore off yesterday. She c/o tightness sensation in stitching. She is using RW at home.   PERTINENT HISTORY: Diabetes Neuropathy in feet  Paresthesia hands HTN PAIN:  Are you having pain? Yes: NPRS scale: 1/10 Pain location: R knee Pain description: soreness, stiffness Aggravating factors: weight bearing Relieving factors: rest  PRECAUTIONS: Fall  WEIGHT BEARING RESTRICTIONS: Yes WBAT  FALLS:  Has patient fallen in last 6 months? Yes. Number of falls knees gave way, and then neuropathy-- couldn't baance  LIVING ENVIRONMENT: Lives with: lives with friend Lives in: House/apartment Stairs: Yes: External: 1 steps; can use walker Has following equipment at home: Walker - 2 wheeled, BSC over toilet, shower chair  PATIENT GOALS: pain free walking, being able to travel without pain  NEXT  MD VISIT: 11/07/24  OBJECTIVE:  Note: Objective measures were completed at Evaluation unless otherwise noted.  PATIENT SURVEYS:  LEFS =15%  COGNITION: Overall cognitive status: Within functional limits for tasks assessed     SENSATION: Numbness in bilat feet  EDEMA:  Circumferential: 41cm R and 39 cm L  POSTURE: rounded shoulders  PALPATION: Edema noted in R LE   LOWER EXTREMITY ROM: Passive ROM Right eval Left eval 09/29/24 Right AROM Right 10/10/24  Hip flexion      Hip extension      Hip abduction      Hip adduction      Hip internal rotation      Hip external rotation      Knee flexion   89 108 AROM supine heel slide  Knee extension -10 Quad set with towel under foot  78 deg  AROM to 64 supine heel slide Lacking 5  -6 degrees   Ankle dorsiflexion      Ankle plantarflexion      Ankle inversion      Ankle eversion       (Blank rows = not tested)  LOWER EXTREMITY MMT: NT due to pain today/ post surgical MMT Right eval Left eval  Hip flexion    Hip extension    Hip abduction    Hip adduction    Hip internal rotation    Hip external rotation    Knee flexion    Knee extension <3/5 --unable to lift fully against gravity   Ankle dorsiflexion    Ankle plantarflexion    Ankle inversion    Ankle eversion     (Blank rows = not tested)  FUNCTIONAL TESTS:  Sit<>stand x 2 reps with UE support slowed pace  GAIT: Distance walked: x 75 feet Assistive device utilized: Walker - 2 wheeled Level of assistance: SBA Comments: step to pattern Stair negotiation step to pattern with SBA x 2 steps  [facility] Adult PT Treatment:                                                DATE: 10/12/24 Therapeutic Exercise: Recumbent bike x 6 minutes with full revolutions SLR with ER x 10 reps x 2 sets Supine heel slides x 10 reps Seated knee flexion blue theraband x 12 reps Active adductor stretch with strap right leg 2x10  Heel press into stability ball 2x10 - for knee flexion   Standing abd kicks x10 both sides without UE support (hand hovered) extra set of right leg kicks HEP review  Manual Therapy: STM to medial right knee - tissue restriction in region medial to tibial tuberosity and medial patella Therapeutic Activity: Forward step up 2x10 right leg  Sit to stands x3 Modalities: Vaso (  Rt) knee, 34 deg, med compression x 10 min   [facility] Adult PT Treatment:                                                DATE: 10/10/24 Therapeutic Exercise: Recumbent bike x 6 minutes with full revolutions SLR with ER x 12 reps x 2 sets Supine heel slides x 8 reps Seated knee flexion blue theraband x 12 reps Manual Therapy: STM gastrocs with IASTM Scar massage along healed portion of incision Therapeutic Activity: Sit<>stand x 10 reps  Step ups to 1 foam pad latrally and anterior/posterior Standing 4 lateral step ups x 10 reps Standing 4 anterior up/up, down/down x 10 reps Isometrics for HS in sitting against physioball x 10 reps Isometrics for quad engagement pressing into physioball x 4 reps-- c/o pain in medial patellar Gait: Gait without device x 300 feet  Backwards walking x 10 reps Slow marching working on balance and stability Modalities: Vaso (Rt) knee, 34 deg, med compression x 10 min   [facility] Adult PT Treatment:                                                DATE: 10/04/2024 Therapeutic Exercise: Seated gastroc stretch (long sitting) with strap 10x10 sec Self-myofascial release with two tennis balls in pillowcase Standing gastroc stretch at wall Manual Therapy: STM/TPR proximal gastroc (L>M) Scar massage along healed portion of incision Neuromuscular re-ed: Quad set  Small range SLR Seated TKE on bosu ball 2x10 Therapeutic Activity: Rocking on recumbent bike x 5 min + subjective intake Supine knee flexion AROM with orange PB Seated heel slides  Walking without AD --> focus on heel strike, equal stride length  Standing knee  flexion Modalities: Vaso (Rt) knee, 34 deg, med compression x 10 min       PATIENT EDUCATION:  Education details: See treatment  Person educated: Patient Education method: Explanation and Demonstration Education comprehension: verbalized understanding, returned demonstration, and needs further education  HOME EXERCISE PROGRAM: Access Code: [email or web address] URL: [email or web address] Date: 09/29/2024 Prepared by: Lucie Meeter  Exercises - Supine Quad Set  - 3 x daily - 7 x weekly - 1 sets - 10 reps - 10 hold - Supine Active Straight Leg Raise  - 2 x daily - 7 x weekly - 1 sets - 10 reps - Supine Heel Slide with Strap  - 3 x daily - 7 x weekly - 1 sets - 10 reps - 10 hold - Sidelying Hip Abduction  - 2 x daily - 7 x weekly - 1 sets - 10 reps - Seated Long Arc Quad  - 3 x daily - 7 x weekly - 1 sets - 10 reps - Seated Ankle Pumps  - 2 x daily - 7 x weekly - 1 sets - 10 reps - Heel Raises with Counter Support  - 2 x daily - 7 x weekly - 1 sets - 10 reps - Seated Knee Flexion Extension AAROM with Overpressure  - 2 x daily - 7 x weekly - 1 sets - 10 reps - 5 sec  hold  Access Code: [email or web address] URL: [email or web address] Date: 10/04/2024 Prepared by: Lamarr Price  Patient Education - Scar Massage  ASSESSMENT: CLINICAL IMPRESSION: Pt  is progressing well post op, targeting adductor/VMO muscle strength today via hip ER during SLR; reported soreness in medial right knee. Thus STM that area, finding tissue restriction in region medial to tibial tuberosity and medial patella. No reported pain/soreness in right knee during heel slides. Required verbal cues throughout standing hip abd kicks to maintain neutral hips and decrease hip ROM for improved mechanics. Challenged with balance during exercise when instructed to not use UE support. Demonstrated good carryover of proper form for sit to stands. No adverse reactions. Will continue to progress as able.   EVAL:  Patient  is a 68 y.o. female who was seen today for physical therapy evaluation and treatment for s/p R TKR. She presents with impairments in AROM, muscle strength, gait mechanics, transfers, and pain. PT to address deficits with goals to return to community ambulation mod indep.    OBJECTIVE IMPAIRMENTS: Abnormal gait, decreased activity tolerance, decreased balance, decreased mobility, decreased ROM, decreased strength, increased fascial restrictions, impaired flexibility, impaired tone, and pain.    GOALS: Goals reviewed with patient? Yes  SHORT TERM GOALS: Target date: 10/21/24  The patient will be indep with initial HEP Baseline: initiated at eval Goal status: MET on 10/10/24  2.  The patient will improve PROM to 100 degrees R knee flexion  Baseline: see above Goal status: MET on 10/10/24  3.  The patient will improve quad set knee extension (towel under heel) to -5 degrees from full extension. Baseline:  see above Goal status: MET 10/02/24  LONG TERM GOALS: Target date: 11/20/2024   The patient will be indep with progression of HEP. Baseline:  initiated at eval Goal status: INITIAL  2.  The patient will improve LEFS by 30%  to demonstrate improved functional abilities. Baseline: see above Goal status: INITIAL  3.  The patient will negotiate 4 steps with reciprocal pattern mod indep. Baseline:  step to pattern Goal status: INITIAL  4.  The patient will imrpove AROM R knee to 110 degrees flexion. Baseline:  see above Goal status: INITIAL  5.  The patient will improve AROM R knee to full extension. Baseline:  see above Goal status: INITIAL  6.  The patient will ambulate without device mod indep. Baseline:  RW Goal status: INITIAL  PLAN:  PT FREQUENCY: 2x/week  PT DURATION: 8 weeks  PLANNED INTERVENTIONS: 97164- PT Re-evaluation, 97750- Physical Performance Testing, 97110-Therapeutic exercises, 97530- Therapeutic activity, [id number]- Neuromuscular re-education, 97535- Self  Care, 02859- Manual therapy, [phone]- Gait training, [phone]- Aquatic Therapy, [phone]- Electrical stimulation (unattended), 97016- Vasopneumatic device, 20560 (1-2 muscles), 20561 (3+ muscles)- Dry Needling, Patient/Family education, Balance training, Stair training, Taping, Joint mobilization, Cryotherapy, and Moist heat  PLAN FOR NEXT SESSION: progress HEP to patient tolerance, gait training, balance, knee flexion ROM and continued quad engagement. Progress closed chain gradually,    Lavanda Cleverly, Student-PT 10/12/24 12:39 PM

## 2024-10-13 ENCOUNTER — Ambulatory Visit

## 2024-10-16 ENCOUNTER — Encounter: Payer: Self-pay | Admitting: Rehabilitative and Restorative Service Providers"

## 2024-10-16 ENCOUNTER — Ambulatory Visit: Admitting: Rehabilitative and Restorative Service Providers"

## 2024-10-16 DIAGNOSIS — M25561 Pain in right knee: Secondary | ICD-10-CM

## 2024-10-16 DIAGNOSIS — M6281 Muscle weakness (generalized): Secondary | ICD-10-CM

## 2024-10-16 DIAGNOSIS — R2689 Other abnormalities of gait and mobility: Secondary | ICD-10-CM

## 2024-10-16 NOTE — Therapy (Signed)
 OUTPATIENT PHYSICAL THERAPY LOWER EXTREMITY TREATMENT  Patient Name: Alexis Mcneil MRN: [id number] DOB:June 21, 1956, 68 y.o., female Today's Date: 10/16/2024  END OF SESSION:  PT End of Session - 10/16/24 1011     Visit Number 8    Number of Visits 16    Date for Recertification  11/21/24    Authorization Type Medicare Part A and B    Progress Note Due on Visit 10    PT Start Time 1012    PT Stop Time 1100    PT Time Calculation (min) 48 min    Activity Tolerance Patient tolerated treatment well    Behavior During Therapy [facility] for tasks assessed/performed          Past Medical History:  Diagnosis Date   Anemia    Arthritis    KNEE,SHOULDERS   Cataract January 2021   Eye appt visit   Diabetes mellitus without complication (HCC)    GERD (gastroesophageal reflux disease)    Heart murmur    Hyperlipidemia    Hypertension    IFG (impaired fasting glucose)    Neuromuscular disorder (HCC)    NEUROPATHY FEET   OSA (obstructive sleep apnea)    CPAP   Sleep apnea    CPAP   Thyroid  disease    Past Surgical History:  Procedure Laterality Date   COLONOSCOPY     endometrial polyp     January 2014   EYE SURGERY  1988   Cyst on eye removed   mouth palate surgery repair  1961   large tear   ROOT CANEL     2 WEEKS AGO   rt knee surgery arthroscopy  2009   torn meniscus/medial   torn ligament shoulder  1975   repaired   TOTAL KNEE ARTHROPLASTY Right 09/20/2024   Procedure: ARTHROPLASTY, KNEE, TOTAL;  Surgeon: Edna Toribio LABOR, MD;  Location: [facility];  Service: Orthopedics;  Laterality: Right;   Patient Active Problem List   Diagnosis Date Noted   Polyarthritis with positive rheumatoid factor (HCC) 10/11/2024   Localized osteoarthritis of right knee 09/20/2024   B12 deficiency 08/17/2024   Primary osteoarthritis of right knee 12/04/2021   CKD (chronic kidney disease) stage 3, GFR 30-59 ml/min (HCC) 11/12/2021   Pre-ulcerative corn or callous 05/20/2020   Excessive  sweating 05/20/2020   Gout 03/07/2018   Hereditary and idiopathic peripheral neuropathy 09/24/2015   Trochanteric bursitis of both hips 10/08/2014   Lumbar spinal stenosis 09/06/2014   Right knee pain 09/06/2014   Obstructive sleep apnea 09/26/2010   Left rotator cuff tear 03/18/2010   PARESTHESIA, HANDS 03/18/2010   Type 2 diabetes mellitus with diabetic chronic kidney disease (HCC) 02/07/2008   Hypothyroidism 01/06/2007   Hyperlipidemia 01/06/2007   ANEMIA NOS 01/06/2007   HYPERTENSION, BENIGN ESSENTIAL 01/06/2007    PCP: Dorothyann Byars, MD REFERRING PROVIDER: Toribio Edna, MD REFERRING DIAG:  Diagnosis  [phone] (ICD-10-CM) - Presence of right artificial knee joint   THERAPY DIAG:  Acute pain of right knee  Other abnormalities of gait and mobility  Muscle weakness (generalized)  Rationale for Evaluation and Treatment: Rehabilitation  ONSET DATE: 09/20/2024  SUBJECTIVE:  SUBJECTIVE STATEMENT: Pt reports she is taking pain meds at night to help her sleep. The right knee is pain free today. She can feel a tight spot in the medial knee, but that is improving.  EVAL: The patient reports nerve block wore off yesterday. She c/o tightness sensation in stitching. She is using RW at home.  PERTINENT HISTORY: Diabetes Neuropathy in feet  Paresthesia hands HTN PAIN:  Are you having pain? Yes: NPRS scale: none today Pain location: R knee Pain description: soreness, stiffness Aggravating factors: weight bearing Relieving factors: rest  PRECAUTIONS: Fall  WEIGHT BEARING RESTRICTIONS: Yes WBAT  FALLS:  Has patient fallen in last 6 months? Yes. Number of falls knees gave way, and then neuropathy-- couldn't baance  LIVING ENVIRONMENT: Lives with: lives with friend Lives in: House/apartment Stairs: Yes: External: 1 steps; can use walker Has following equipment at home: Walker - 2 wheeled, BSC over toilet, shower chair  PATIENT GOALS: pain free walking, being  able to travel without pain  NEXT MD VISIT: 11/07/24  OBJECTIVE:  Note: Objective measures were completed at Evaluation unless otherwise noted.  PATIENT SURVEYS:  LEFS =15%  COGNITION: Overall cognitive status: Within functional limits for tasks assessed     SENSATION: Numbness in bilat feet  EDEMA:  Circumferential: 41cm R and 39 cm L  POSTURE: rounded shoulders  PALPATION: Edema noted in R LE   LOWER EXTREMITY ROM: Passive ROM Right eval Left eval 09/29/24 Right AROM Right 10/10/24  Hip flexion      Hip extension      Hip abduction      Hip adduction      Hip internal rotation      Hip external rotation      Knee flexion   89 108 AROM supine heel slide  Knee extension -10 Quad set with towel under foot  78 deg  AROM to 64 supine heel slide Lacking 5  -6 degrees   Ankle dorsiflexion      Ankle plantarflexion      Ankle inversion      Ankle eversion       (Blank rows = not tested)  LOWER EXTREMITY MMT: NT due to pain today/ post surgical MMT Right eval Left eval  Hip flexion    Hip extension    Hip abduction    Hip adduction    Hip internal rotation    Hip external rotation    Knee flexion    Knee extension <3/5 --unable to lift fully against gravity   Ankle dorsiflexion    Ankle plantarflexion    Ankle inversion    Ankle eversion     (Blank rows = not tested)  FUNCTIONAL TESTS:  Sit<>stand x 2 reps with UE support slowed pace  GAIT: Distance walked: x 75 feet Assistive device utilized: Walker - 2 wheeled Level of assistance: SBA Comments: step to pattern Stair negotiation step to pattern with SBA x 2 steps    [facility] Adult PT Treatment:                                                DATE: 10/16/24 Therapeutic Exercise: Recumbent bike x 5 minutes with full revolution Prone Knee flexion x 8 reps Supine Heel slides with AAROM end range overpressure x 8 reps with 10 second holds-- gets to 113 degrees ER x 12 reps with SLR  Manual  Therapy: STM and IASTM R soleous, gastroc and medial right knee Therapeutic Activity: Sit<>stand x 10 reps no UE support Step ups 4 anteriorly x 12 reps with cues knee extension Standing terminal knee extension with green band R LE  Step downs x 4 step leading with L LE x 10 reps Step  ups 4 lateral x 10 reps Toe raises/heel raises x 12 reps near UE support Single leg standing R and L sides dec'ing UE support Gait: Stair negotiation x reciprocal pattern x 4 and 6 steps x 2 reps x 3 steps with UE support Gait with farmer's carry x 5# x 300 ft  Modalities: Vaso x 10 minutes    [facility] Adult PT Treatment:                                                DATE: 10/12/24 Therapeutic Exercise: Recumbent bike x 6 minutes with full revolutions SLR with ER x 10 reps x 2 sets Supine heel slides x 10 reps Seated knee flexion blue theraband x 12 reps Active adductor stretch with strap right leg 2x10  Heel press into stability ball 2x10 - for knee flexion  Standing abd kicks x10 both sides without UE support (hand hovered) extra set of right leg kicks HEP review  Manual Therapy: STM to medial right knee - tissue restriction in region medial to tibial tuberosity and medial patella Therapeutic Activity: Forward step up 2x10 right leg  Sit to stands x3 Modalities: Vaso (Rt) knee, 34 deg, med compression x 10 min   [facility] Adult PT Treatment:                                                DATE: 10/10/24 Therapeutic Exercise: Recumbent bike x 6 minutes with full revolutions SLR with ER x 12 reps x 2 sets Supine heel slides x 8 reps Seated knee flexion blue theraband x 12 reps Manual Therapy: STM gastrocs with IASTM Scar massage along healed portion of incision Therapeutic Activity: Sit<>stand x 10 reps  Step ups to 1 foam pad latrally and anterior/posterior Standing 4 lateral step ups x 10 reps Standing 4 anterior up/up, down/down x 10 reps Isometrics for HS in sitting against physioball  x 10 reps Isometrics for quad engagement pressing into physioball x 4 reps-- c/o pain in medial patellar Gait: Gait without device x 300 feet  Backwards walking x 10 reps Slow marching working on balance and stability Modalities: Vaso (Rt) knee, 34 deg, med compression x 10 min   PATIENT EDUCATION:  Education details: See treatment  Person educated: Patient Education method: Explanation and Demonstration Education comprehension: verbalized understanding, returned demonstration, and needs further education  HOME EXERCISE PROGRAM: Access Code: [email or web address] URL: [email or web address] Date: 09/29/2024 Prepared by: Lucie Meeter  Exercises - Supine Quad Set  - 3 x daily - 7 x weekly - 1 sets - 10 reps - 10 hold - Supine Active Straight Leg Raise  - 2 x daily - 7 x weekly - 1 sets - 10 reps - Supine Heel Slide with Strap  - 3 x daily - 7 x weekly - 1 sets - 10 reps - 10 hold - Sidelying Hip Abduction  - 2 x daily - 7 x weekly - 1 sets - 10 reps - Seated Long Arc Quad  - 3 x daily - 7 x weekly - 1 sets - 10 reps - Seated Ankle Pumps  - 2 x daily - 7 x weekly - 1 sets - 10 reps - Heel Raises with Counter Support  -  2 x daily - 7 x weekly - 1 sets - 10 reps - Seated Knee Flexion Extension AAROM with Overpressure  - 2 x daily - 7 x weekly - 1 sets - 10 reps - 5 sec  hold  Access Code: [email or web address] URL: [email or web address] Date: 10/04/2024 Prepared by: Lamarr Price  Patient Education - Scar Massage  ASSESSMENT: CLINICAL IMPRESSION: The patient is progressing well with therapy gaining ROM and functional strength. PT continuing to progress strengthening increasing standing activities today. PT and patient discussed potential to reduce frequency as she meets ROM goals.  Plan to reassess and modify as able since she is progressing well.   EVAL:  Patient is a 68 y.o. female who was seen today for physical therapy evaluation and treatment for s/p R TKR. She presents  with impairments in AROM, muscle strength, gait mechanics, transfers, and pain. PT to address deficits with goals to return to community ambulation mod indep.    OBJECTIVE IMPAIRMENTS: Abnormal gait, decreased activity tolerance, decreased balance, decreased mobility, decreased ROM, decreased strength, increased fascial restrictions, impaired flexibility, impaired tone, and pain.    GOALS: Goals reviewed with patient? Yes  SHORT TERM GOALS: Target date: 10/21/24  The patient will be indep with initial HEP Baseline: initiated at eval Goal status: MET on 10/10/24  2.  The patient will improve PROM to 100 degrees R knee flexion  Baseline: see above Goal status: MET on 10/10/24  3.  The patient will improve quad set knee extension (towel under heel) to -5 degrees from full extension. Baseline:  see above Goal status: MET 10/02/24  LONG TERM GOALS: Target date: 11/20/2024   The patient will be indep with progression of HEP. Baseline:  initiated at eval Goal status: INITIAL  2.  The patient will improve LEFS by 30%  to demonstrate improved functional abilities. Baseline: see above Goal status: INITIAL  3.  The patient will negotiate 4 steps with reciprocal pattern mod indep. Baseline:  step to pattern Goal status: MET on 10/16/24  4.  The patient will imrpove AROM R knee to 110 degrees flexion. Baseline:  see above Goal status: INITIAL  5.  The patient will improve AROM R knee to full extension. Baseline:  see above Goal status: INITIAL  6.  The patient will ambulate without device mod indep. Baseline:  RW Goal status: MET on 10/16/24  PLAN:  PT FREQUENCY: 2x/week  PT DURATION: 8 weeks  PLANNED INTERVENTIONS: 97164- PT Re-evaluation, 97750- Physical Performance Testing, 97110-Therapeutic exercises, 97530- Therapeutic activity, 97112- Neuromuscular re-education, 97535- Self Care, 02859- Manual therapy, [phone]- Gait training, [phone]- Aquatic Therapy, [phone]- Electrical  stimulation (unattended), 97016- Vasopneumatic device, 20560 (1-2 muscles), 20561 (3+ muscles)- Dry Needling, Patient/Family education, Balance training, Stair training, Taping, Joint mobilization, Cryotherapy, and Moist heat  PLAN FOR NEXT SESSION: progress HEP to patient tolerance, gait training, balance, knee flexion ROM and continued quad engagement. Progress closed chain gradually,     Idabell Picking, PT 10/16/24 10:12 AM

## 2024-10-19 ENCOUNTER — Ambulatory Visit: Admitting: Rehabilitative and Restorative Service Providers"

## 2024-10-19 ENCOUNTER — Encounter: Payer: Self-pay | Admitting: Rehabilitative and Restorative Service Providers"

## 2024-10-19 DIAGNOSIS — M6281 Muscle weakness (generalized): Secondary | ICD-10-CM

## 2024-10-19 DIAGNOSIS — R2689 Other abnormalities of gait and mobility: Secondary | ICD-10-CM

## 2024-10-19 DIAGNOSIS — M25561 Pain in right knee: Secondary | ICD-10-CM | POA: Diagnosis not present

## 2024-10-19 NOTE — Therapy (Signed)
 OUTPATIENT PHYSICAL THERAPY LOWER EXTREMITY TREATMENT  Patient Name: Alexis Mcneil MRN: [id number] DOB:07-21-1956, 68 y.o., female Today's Date: 10/19/2024  END OF SESSION:  PT End of Session - 10/19/24 1016     Visit Number 9    Number of Visits 16    Date for Recertification  11/21/24    Authorization Type Medicare Part A and B    Progress Note Due on Visit 10    PT Start Time 1016    PT Stop Time 1100    PT Time Calculation (min) 44 min    Activity Tolerance Patient tolerated treatment well    Behavior During Therapy [facility] for tasks assessed/performed          Past Medical History:  Diagnosis Date   Anemia    Arthritis    KNEE,SHOULDERS   Cataract January 2021   Eye appt visit   Diabetes mellitus without complication (HCC)    GERD (gastroesophageal reflux disease)    Heart murmur    Hyperlipidemia    Hypertension    IFG (impaired fasting glucose)    Neuromuscular disorder (HCC)    NEUROPATHY FEET   OSA (obstructive sleep apnea)    CPAP   Sleep apnea    CPAP   Thyroid  disease    Past Surgical History:  Procedure Laterality Date   COLONOSCOPY     endometrial polyp     January 2014   EYE SURGERY  1988   Cyst on eye removed   mouth palate surgery repair  1961   large tear   ROOT CANEL     2 WEEKS AGO   rt knee surgery arthroscopy  2009   torn meniscus/medial   torn ligament shoulder  1975   repaired   TOTAL KNEE ARTHROPLASTY Right 09/20/2024   Procedure: ARTHROPLASTY, KNEE, TOTAL;  Surgeon: Edna Toribio LABOR, MD;  Location: [facility];  Service: Orthopedics;  Laterality: Right;   Patient Active Problem List   Diagnosis Date Noted   Polyarthritis with positive rheumatoid factor (HCC) 10/11/2024   Localized osteoarthritis of right knee 09/20/2024   B12 deficiency 08/17/2024   Primary osteoarthritis of right knee 12/04/2021   CKD (chronic kidney disease) stage 3, GFR 30-59 ml/min (HCC) 11/12/2021   Pre-ulcerative corn or callous 05/20/2020   Excessive  sweating 05/20/2020   Gout 03/07/2018   Hereditary and idiopathic peripheral neuropathy 09/24/2015   Trochanteric bursitis of both hips 10/08/2014   Lumbar spinal stenosis 09/06/2014   Right knee pain 09/06/2014   Obstructive sleep apnea 09/26/2010   Left rotator cuff tear 03/18/2010   PARESTHESIA, HANDS 03/18/2010   Type 2 diabetes mellitus with diabetic chronic kidney disease (HCC) 02/07/2008   Hypothyroidism 01/06/2007   Hyperlipidemia 01/06/2007   ANEMIA NOS 01/06/2007   HYPERTENSION, BENIGN ESSENTIAL 01/06/2007    PCP: Dorothyann Byars, MD REFERRING PROVIDER: Toribio Edna, MD REFERRING DIAG:  Diagnosis  [phone] (ICD-10-CM) - Presence of right artificial knee joint   THERAPY DIAG:  Acute pain of right knee  Other abnormalities of gait and mobility  Muscle weakness (generalized)  Rationale for Evaluation and Treatment: Rehabilitation  ONSET DATE: 09/20/2024  SUBJECTIVE:  SUBJECTIVE STATEMENT: Pt is doing well with her knee and home exercises. She used her bike pedals (on floor) 2 days ago and got soreness.  The patient does feel like she has any functional limitations. Her next f/u is 11/07/24.   EVAL: The patient reports nerve block wore off yesterday. She c/o tightness sensation in stitching. She is  using RW at home.   PERTINENT HISTORY: Diabetes Neuropathy in feet  Paresthesia hands HTN PAIN:  Are you having pain? Yes: NPRS scale: none today Pain location: R knee Pain description: soreness, stiffness Aggravating factors: weight bearing Relieving factors: rest  PRECAUTIONS: Fall  WEIGHT BEARING RESTRICTIONS: Yes WBAT  FALLS:  Has patient fallen in last 6 months? Yes. Number of falls knees gave way, and then neuropathy-- couldn't baance  LIVING ENVIRONMENT: Lives with: lives with friend Lives in: House/apartment Stairs: Yes: External: 1 steps; can use walker Has following equipment at home: Walker - 2 wheeled, BSC over toilet, shower  chair  PATIENT GOALS: pain free walking, being able to travel without pain  NEXT MD VISIT: 11/07/24  OBJECTIVE:  Note: Objective measures were completed at Evaluation unless otherwise noted.  PATIENT SURVEYS:  LEFS =15%  COGNITION: Overall cognitive status: Within functional limits for tasks assessed     SENSATION: Numbness in bilat feet  EDEMA:  Circumferential: 41cm R and 39 cm L  POSTURE: rounded shoulders  PALPATION: Edema noted in R LE   LOWER EXTREMITY ROM: Passive ROM Right eval Left eval 09/29/24 Right AROM Right 10/10/24 Right 10/19/24  Hip flexion       Hip extension       Hip abduction       Hip adduction       Hip internal rotation       Hip external rotation       Knee flexion   89 108 AROM supine heel slide 108 AROM Supine heel slide  Knee extension -10 Quad set with towel under foot  78 deg  AROM to 64 supine heel slide Lacking 5  -6 degrees  -2 deg from extension  Ankle dorsiflexion       Ankle plantarflexion       Ankle inversion       Ankle eversion        (Blank rows = not tested)  LOWER EXTREMITY MMT: NT due to pain today/ post surgical MMT Right eval Left eval  Hip flexion    Hip extension    Hip abduction    Hip adduction    Hip internal rotation    Hip external rotation    Knee flexion    Knee extension <3/5 --unable to lift fully against gravity   Ankle dorsiflexion    Ankle plantarflexion    Ankle inversion    Ankle eversion     (Blank rows = not tested)  FUNCTIONAL TESTS:  Sit<>stand x 2 reps with UE support slowed pace  GAIT: Distance walked: x 75 feet Assistive device utilized: Walker - 2 wheeled Level of assistance: SBA Comments: step to pattern Stair negotiation step to pattern with SBA x 2 steps   [facility] Adult PT Treatment:                                                DATE: 10/19/24 Therapeutic Exercise: Recumbent bike x 5 minutes full revolution Supine  Heel slides x 10 reps Knee to chest with passive  overpressure Self mobilization with towel stretch Seated AAROM end range knee flexion Therapeutic Activity: Up/up down/down x 10 reps R and L sides Step ups anterior x 10 reps Single leg standing R and L near countertop for support Feet together eyes closed in corner Gait: Toe walking Backward walking  Marching Gait without device Self Care: [facility] Discussed reducing frequency to 1x/week and working towards early d/c-- PT emphasizing getting to 115 ROM knee fleixon is able.   [facility] Adult PT Treatment:                                                DATE: 10/16/24 Therapeutic Exercise: Recumbent bike x 5 minutes with full revolution Prone Knee flexion x 8 reps Supine Heel slides with AAROM end range overpressure x 8 reps with 10 second holds-- gets to 113 degrees ER x 12 reps with SLR  Manual Therapy: STM and IASTM R soleous, gastroc and medial right knee Therapeutic Activity: Sit<>stand x 10 reps no UE support Step ups 4 anteriorly x 12 reps with cues knee extension Standing terminal knee extension with green band R LE  Step downs x 4 step leading with L LE x 10 reps Step ups 4 lateral x 10 reps Toe raises/heel raises x 12 reps near UE support Single leg standing R and L sides dec'ing UE support Gait: Stair negotiation x reciprocal pattern x 4 and 6 steps x 2 reps x 3 steps with UE support Gait with farmer's carry x 5# x 300 ft  Modalities: Vaso x 10 minutes    [facility] Adult PT Treatment:                                                DATE: 10/12/24 Therapeutic Exercise: Recumbent bike x 6 minutes with full revolutions SLR with ER x 10 reps x 2 sets Supine heel slides x 10 reps Seated knee flexion blue theraband x 12 reps Active adductor stretch with strap right leg 2x10  Heel press into stability ball 2x10 - for knee flexion  Standing abd kicks x10 both sides without UE support (hand hovered) extra set of right leg kicks HEP  review  Manual Therapy: STM to medial right knee - tissue restriction in region medial to tibial tuberosity and medial patella Therapeutic Activity: Forward step up 2x10 right leg  Sit to stands x3 Modalities: Vaso (Rt) knee, 34 deg, med compression x 10 min   [facility] Adult PT Treatment:                                                DATE: 10/10/24 Therapeutic Exercise: Recumbent bike x 6 minutes with full revolutions SLR with ER x 12 reps x 2 sets Supine heel slides x 8 reps Seated knee flexion blue theraband x 12 reps Manual Therapy: STM gastrocs with IASTM Scar massage along healed portion of incision Therapeutic Activity: Sit<>stand x 10 reps  Step ups to 1 foam pad latrally and anterior/posterior Standing 4 lateral step ups x 10 reps Standing 4 anterior up/up, down/down x 10 reps Isometrics for HS in sitting against physioball x 10 reps Isometrics for quad engagement pressing into physioball x 4 reps-- c/o pain in medial patellar Gait: Gait without device x 300 feet  Backwards walking x 10 reps Slow marching working on balance and stability Modalities: Vaso (Rt) knee, 34  deg, med compression x 10 min   PATIENT EDUCATION:  Education details: See treatment  Person educated: Patient Education method: Explanation and Demonstration Education comprehension: verbalized understanding, returned demonstration, and needs further education  HOME EXERCISE PROGRAM: Access Code: [email or web address] URL: [email or web address] Date: 10/19/2024 Prepared by: Tawni Ferrier  Exercises - Supine Active Straight Leg Raise  - 1 x daily - 4 x weekly - 1 sets - 10 reps - Supine Heel Slide with Strap  - 1 x daily - 4 x weekly - 1 sets - 10 reps - 10 hold - Supine Knee Posterior Glide Self-Mobilization  - 1 x daily - 4 x weekly - 1 sets - 10 reps - Sidelying Hip Abduction  - 1 x daily - 4 x weekly - 1 sets - 10 reps - Seated Long Arc Quad  - 1 x daily - 4 x weekly - 1 sets - 10  reps - Heel Raises with Counter Support  - 1 x daily - 4 x weekly - 1 sets - 10 reps - Single Leg Stance with Support  - 1 x daily - 4 x weekly - 1 sets - 3 reps - 10-15 seconds  hold - Corner Balance Feet Together With Eyes Closed  - 1 x daily - 4 x weekly - 1 sets - 3 reps - 30 seconds  hold  ASSESSMENT: CLINICAL IMPRESSION: The patient feels her goals for therapy are met. PT to do one more check since we added knee mobilization to increase knee flexion ROM (if able) and 2 exercises for balance due to h/o neuropathy. PT to check remaining LTGs next visit and assess for d/c. PT and patient also discussed walking program in community for endurance and strengthening post d/c.   EVAL:  Patient is a 68 y.o. female who was seen today for physical therapy evaluation and treatment for s/p R TKR. She presents with impairments in AROM, muscle strength, gait mechanics, transfers, and pain. PT to address deficits with goals to return to community ambulation mod indep.    OBJECTIVE IMPAIRMENTS: Abnormal gait, decreased activity tolerance, decreased balance, decreased mobility, decreased ROM, decreased strength, increased fascial restrictions, impaired flexibility, impaired tone, and pain.   GOALS: Goals reviewed with patient? Yes  SHORT TERM GOALS: Target date: 10/21/24  The patient will be indep with initial HEP Baseline: initiated at eval Goal status: MET on 10/10/24  2.  The patient will improve PROM to 100 degrees R knee flexion  Baseline: see above Goal status: MET on 10/10/24  3.  The patient will improve quad set knee extension (towel under heel) to -5 degrees from full extension. Baseline:  see above Goal status: MET 10/02/24  LONG TERM GOALS: Target date: 11/20/2024   The patient will be indep with progression of HEP. Baseline:  initiated at eval Goal status: MET on 10/19/24   2.  The patient will improve LEFS by 30%  to demonstrate improved functional abilities. Baseline: see  above Goal status: INITIAL  3.  The patient will negotiate 4 steps with reciprocal pattern mod indep. Baseline:  step to pattern Goal status: MET on 10/16/24  4.  The patient will imrpove AROM R knee to 110 degrees flexion. Baseline:  see above Goal status: INITIAL  5.  The patient will improve AROM R knee to full extension. Baseline:  see above Goal status: INITIAL  6.  The patient will ambulate without device mod indep. Baseline:  RW Goal status: MET on 10/16/24  PLAN:  PT FREQUENCY:  2x/week  PT DURATION: 8 weeks  PLANNED INTERVENTIONS: 97164- PT Re-evaluation, 97750- Physical Performance Testing, 97110-Therapeutic exercises, 97530- Therapeutic activity, [id number]- Neuromuscular re-education, 97535- Self Care, [id number]- Manual therapy, [phone]- Gait training, [phone]- Aquatic Therapy, [phone]- Electrical stimulation (unattended), 97016- Vasopneumatic device, 20560 (1-2 muscles), 20561 (3+ muscles)- Dry Needling, Patient/Family education, Balance training, Stair training, Taping, Joint mobilization, Cryotherapy, and Moist heat  PLAN FOR NEXT SESSION: progress HEP to patient tolerance, gait training, balance, knee flexion ROM and continued quad engagement. The patient is ready for early d/c-- subjectively reports no limitations-- PT wanting to ensure we maximize knee ROM prior to d/c. Check goals and determine if we can d/c next session or continue 1x/week.   Danylle Ouk, PT 10/19/2024 10:20 AM

## 2024-10-23 ENCOUNTER — Other Ambulatory Visit: Payer: Self-pay | Admitting: Family Medicine

## 2024-10-23 ENCOUNTER — Ambulatory Visit

## 2024-10-23 DIAGNOSIS — Z1231 Encounter for screening mammogram for malignant neoplasm of breast: Secondary | ICD-10-CM

## 2024-10-24 ENCOUNTER — Inpatient Hospital Stay: Admission: RE | Admit: 2024-10-24 | Discharge: 2024-10-24 | Attending: Family Medicine | Admitting: Family Medicine

## 2024-10-24 DIAGNOSIS — Z1231 Encounter for screening mammogram for malignant neoplasm of breast: Secondary | ICD-10-CM

## 2024-10-24 NOTE — Progress Notes (Unsigned)
° °  Subjective:    Patient ID: Alexis Mcneil, female    DOB: 1956-03-11, 68 y.o.   MRN: [id number]  HPI  Patient is here for her 3rd injection of her every 2 wk B12 series. Patient did receive an extra weekly injection. Denies muscle cramps, weakness or irregular heart rate.   Review of Systems     Objective:   Physical Exam        Assessment & Plan:   Patient given in her LD. Tolerated well no redness or swelling. Noted at the site. Reported to Dr. Alvan that patient had received an extra weekly injection, and per her Patient can go ahead and transition to monthly injections. She is due for her next one 11/22/24. She is coming in on 11/27/24 for an OV. States she will get injection while in that visit.

## 2024-10-25 ENCOUNTER — Other Ambulatory Visit

## 2024-10-25 ENCOUNTER — Ambulatory Visit

## 2024-10-25 VITALS — BP 118/66 | HR 87 | Resp 18 | Ht 65.0 in | Wt 185.0 lb

## 2024-10-25 DIAGNOSIS — E538 Deficiency of other specified B group vitamins: Secondary | ICD-10-CM

## 2024-10-25 MED ORDER — CYANOCOBALAMIN 1000 MCG/ML IJ SOLN
1000.0000 ug | INTRAMUSCULAR | Status: AC
  Administered 2024-11-27: 1000 ug via INTRAMUSCULAR

## 2024-10-26 ENCOUNTER — Ambulatory Visit

## 2024-10-26 DIAGNOSIS — R2689 Other abnormalities of gait and mobility: Secondary | ICD-10-CM

## 2024-10-26 DIAGNOSIS — M25561 Pain in right knee: Secondary | ICD-10-CM

## 2024-10-26 DIAGNOSIS — M6281 Muscle weakness (generalized): Secondary | ICD-10-CM

## 2024-10-26 NOTE — Therapy (Signed)
 OUTPATIENT PHYSICAL THERAPY LOWER EXTREMITY TREATMENT PHYSICAL THERAPY DISCHARGE SUMMARY Progress Note Reporting Period 09/22/24 to 10/26/24  See note below for Objective Data and Assessment of Progress/Goals.      Visits from Start of Care: 10  Current functional level related to goals / functional outcomes: All goals met   Remaining deficits: Mild limitation Rt knee flexion AROM   Education / Equipment: See education    Patient agrees to discharge. Patient goals were met. Patient is being discharged due to meeting the stated rehab goals.  Patient Name: Alexis Mcneil MRN: [id number] DOB:03/31/1956, 68 y.o., female Today's Date: 10/26/2024  END OF SESSION:  PT End of Session - 10/26/24 1013     Visit Number 10    Number of Visits 16    Date for Recertification  11/21/24    Authorization Type Medicare Part A and B    Progress Note Due on Visit 10    PT Start Time 1013    PT Stop Time 1051    PT Time Calculation (min) 38 min    Activity Tolerance Patient tolerated treatment well    Behavior During Therapy [facility] for tasks assessed/performed           Past Medical History:  Diagnosis Date   Anemia    Arthritis    KNEE,SHOULDERS   Cataract January 2021   Eye appt visit   Diabetes mellitus without complication (HCC)    GERD (gastroesophageal reflux disease)    Heart murmur    Hyperlipidemia    Hypertension    IFG (impaired fasting glucose)    Neuromuscular disorder (HCC)    NEUROPATHY FEET   OSA (obstructive sleep apnea)    CPAP   Sleep apnea    CPAP   Thyroid  disease    Past Surgical History:  Procedure Laterality Date   COLONOSCOPY     endometrial polyp     January 2014   EYE SURGERY  1988   Cyst on eye removed   mouth palate surgery repair  1961   large tear   ROOT CANEL     2 WEEKS AGO   rt knee surgery arthroscopy  2009   torn meniscus/medial   torn ligament shoulder  1975   repaired   TOTAL KNEE ARTHROPLASTY Right 09/20/2024    Procedure: ARTHROPLASTY, KNEE, TOTAL;  Surgeon: Edna Toribio LABOR, MD;  Location: [facility];  Service: Orthopedics;  Laterality: Right;   Patient Active Problem List   Diagnosis Date Noted   Polyarthritis with positive rheumatoid factor (HCC) 10/11/2024   Localized osteoarthritis of right knee 09/20/2024   B12 deficiency 08/17/2024   Primary osteoarthritis of right knee 12/04/2021   CKD (chronic kidney disease) stage 3, GFR 30-59 ml/min (HCC) 11/12/2021   Pre-ulcerative corn or callous 05/20/2020   Excessive sweating 05/20/2020   Gout 03/07/2018   Hereditary and idiopathic peripheral neuropathy 09/24/2015   Trochanteric bursitis of both hips 10/08/2014   Lumbar spinal stenosis 09/06/2014   Right knee pain 09/06/2014   Obstructive sleep apnea 09/26/2010   Left rotator cuff tear 03/18/2010   PARESTHESIA, HANDS 03/18/2010   Type 2 diabetes mellitus with diabetic chronic kidney disease (HCC) 02/07/2008   Hypothyroidism 01/06/2007   Hyperlipidemia 01/06/2007   ANEMIA NOS 01/06/2007   HYPERTENSION, BENIGN ESSENTIAL 01/06/2007    PCP: Dorothyann Byars, MD REFERRING PROVIDER: Toribio Edna, MD REFERRING DIAG:  Diagnosis  [phone] (ICD-10-CM) - Presence of right artificial knee joint   THERAPY DIAG:  Acute pain of right  knee  Other abnormalities of gait and mobility  Muscle weakness (generalized)  Rationale for Evaluation and Treatment: Rehabilitation  ONSET DATE: 09/20/2024  SUBJECTIVE:  SUBJECTIVE STATEMENT: Patient reports she is doing really well. She feels that she is ready for discharge.   EVAL: The patient reports nerve block wore off yesterday. She c/o tightness sensation in stitching. She is using RW at home.   PERTINENT HISTORY: Diabetes Neuropathy in feet  Paresthesia hands HTN PAIN:  Are you having pain? No  PRECAUTIONS: Fall  WEIGHT BEARING RESTRICTIONS: Yes WBAT  FALLS:  Has patient fallen in last 6 months? Yes. Number of falls knees gave way,  and then neuropathy-- couldn't baance  LIVING ENVIRONMENT: Lives with: lives with friend Lives in: House/apartment Stairs: Yes: External: 1 steps; can use walker Has following equipment at home: Walker - 2 wheeled, BSC over toilet, shower chair  PATIENT GOALS: pain free walking, being able to travel without pain  NEXT MD VISIT: 11/07/24  OBJECTIVE:  Note: Objective measures were completed at Evaluation unless otherwise noted.  PATIENT SURVEYS:  LEFS =15%  10/26/24: 60/80; 75%   COGNITION: Overall cognitive status: Within functional limits for tasks assessed     SENSATION: Numbness in bilat feet  EDEMA:  Circumferential: 41cm R and 39 cm L  POSTURE: rounded shoulders  PALPATION: Edema noted in R LE   LOWER EXTREMITY ROM: Passive ROM Right eval Left eval 09/29/24 Right AROM Right 10/10/24 Right 10/19/24 10/26/24 Right AROM  Hip flexion        Hip extension        Hip abduction        Hip adduction        Hip internal rotation        Hip external rotation        Knee flexion   89 108 AROM supine heel slide 108 AROM Supine heel slide 110  Knee extension -10 Quad set with towel under foot  78 deg  AROM to 64 supine heel slide Lacking 5  -6 degrees  -2 deg from extension 0  Ankle dorsiflexion        Ankle plantarflexion        Ankle inversion        Ankle eversion         (Blank rows = not tested)  LOWER EXTREMITY MMT: NT due to pain today/ post surgical MMT Right eval Left eval  Hip flexion    Hip extension    Hip abduction    Hip adduction    Hip internal rotation    Hip external rotation    Knee flexion    Knee extension <3/5 --unable to lift fully against gravity   Ankle dorsiflexion    Ankle plantarflexion    Ankle inversion    Ankle eversion     (Blank rows = not tested)  FUNCTIONAL TESTS:  Sit<>stand x 2 reps with UE support slowed pace  GAIT: Distance walked: x 75 feet Assistive device utilized: Walker - 2 wheeled Level of  assistance: SBA Comments: step to pattern Stair negotiation step to pattern with SBA x 2 steps  [facility] Adult PT Treatment:                                                DATE: 10/26/24 Therapeutic Exercise: Reviewed and updated HEP discussing frequency, sets,  reps, and ways to progress independently  Therapeutic Activity: Re-assessment of goals, educating patient on overall functional progress  Self Care: Discussed gyms in the community and appropriate equipment to utilize  Continue ice as needed  Scar tissue massage   [facility] Adult PT Treatment:                                                DATE: 10/19/24 Therapeutic Exercise: Recumbent bike x 5 minutes full revolution Supine  Heel slides x 10 reps Knee to chest with passive overpressure Self mobilization with towel stretch Seated AAROM end range knee flexion Therapeutic Activity: Up/up down/down x 10 reps R and L sides Step ups anterior x 10 reps Single leg standing R and L near countertop for support Feet together eyes closed in corner Gait: Toe walking Backward walking Marching Gait without device Self Care: Walking track at [place] [facility] Discussed reducing frequency to 1x/week and working towards early d/c-- PT emphasizing getting to 115 ROM knee fleixon is able.   [facility] Adult PT Treatment:                                                DATE: 10/16/24 Therapeutic Exercise: Recumbent bike x 5 minutes with full revolution Prone Knee flexion x 8 reps Supine Heel slides with AAROM end range overpressure x 8 reps with 10 second holds-- gets to 113 degrees ER x 12 reps with SLR  Manual Therapy: STM and IASTM R soleous, gastroc and medial right knee Therapeutic Activity: Sit<>stand x 10 reps no UE support Step ups 4 anteriorly x 12 reps with cues knee extension Standing terminal knee extension with green band R LE  Step downs x 4 step leading with L LE x 10 reps Step ups 4 lateral x 10 reps Toe  raises/heel raises x 12 reps near UE support Single leg standing R and L sides dec'ing UE support Gait: Stair negotiation x reciprocal pattern x 4 and 6 steps x 2 reps x 3 steps with UE support Gait with farmer's carry x 5# x 300 ft  Modalities: Vaso x 10 minutes    [facility] Adult PT Treatment:                                                DATE: 10/12/24 Therapeutic Exercise: Recumbent bike x 6 minutes with full revolutions SLR with ER x 10 reps x 2 sets Supine heel slides x 10 reps Seated knee flexion blue theraband x 12 reps Active adductor stretch with strap right leg 2x10  Heel press into stability ball 2x10 - for knee flexion  Standing abd kicks x10 both sides without UE support (hand hovered) extra set of right leg kicks HEP review  Manual Therapy: STM to medial right knee - tissue restriction in region medial to tibial tuberosity and medial patella Therapeutic Activity: Forward step up 2x10 right leg  Sit to stands x3 Modalities: Vaso (Rt) knee, 34 deg, med compression x 10 min    PATIENT EDUCATION:  Education details: See treatment; d/c education  Person educated: Patient Education  method: Explanation and Demonstration, handout  Education comprehension: verbalized understanding and returned demonstration  HOME EXERCISE PROGRAM: Access Code: [email or web address] URL: [email or web address] Date: 10/26/2024 Prepared by: Lucie Meeter  Exercises - Supine Heel Slide with Strap  - 1 x daily - 7 x weekly - 1 sets - 10 reps - 10 hold - Supine Knee Posterior Glide Self-Mobilization  - 1 x daily - 7 x weekly - 1 sets - 10 reps - Seated Knee Flexion AAROM  - 1 x daily - 7 x weekly - 1 sets - 10 reps - 5 sec  hold - Supine Active Straight Leg Raise  - 1 x daily - 4 x weekly - 1 sets - 10 reps - Sidelying Hip Abduction  - 1 x daily - 4 x weekly - 1 sets - 10 reps - Seated Long Arc Quad  - 1 x daily - 4 x weekly - 1 sets - 10 reps - Heel Raises with Counter Support  - 1 x  daily - 4 x weekly - 1 sets - 10 reps - Single Leg Stance with Support  - 1 x daily - 4 x weekly - 1 sets - 3 reps - 10-15 seconds  hold - Corner Balance Feet Together With Eyes Closed  - 1 x daily - 4 x weekly - 1 sets - 3 reps - 30 seconds  hold  ASSESSMENT: CLINICAL IMPRESSION: Shacoya has made excellent progress in PT s/p Rt TKA. She demonstrates overall improvements in Rt knee ROM, strength, gait, and balance. She has met all established functional goals and is independent with HEP. She does have mild limitation remaining in knee flexion and understands importance of continuing with prescribed HEP to address this deficit. She is therefore appropriate for discharge at this time with patient in agreement with this plan.   EVAL:  Patient is a 68 y.o. female who was seen today for physical therapy evaluation and treatment for s/p R TKR. She presents with impairments in AROM, muscle strength, gait mechanics, transfers, and pain. PT to address deficits with goals to return to community ambulation mod indep.    OBJECTIVE IMPAIRMENTS: Abnormal gait, decreased activity tolerance, decreased balance, decreased mobility, decreased ROM, decreased strength, increased fascial restrictions, impaired flexibility, impaired tone, and pain.   GOALS: Goals reviewed with patient? Yes  SHORT TERM GOALS: Target date: 10/21/24  The patient will be indep with initial HEP Baseline: initiated at eval Goal status: MET on 10/10/24  2.  The patient will improve PROM to 100 degrees R knee flexion  Baseline: see above Goal status: MET on 10/10/24  3.  The patient will improve quad set knee extension (towel under heel) to -5 degrees from full extension. Baseline:  see above Goal status: MET 10/02/24  LONG TERM GOALS: Target date: 11/20/2024   The patient will be indep with progression of HEP. Baseline:  initiated at eval Goal status: MET on 10/19/24   2.  The patient will improve LEFS by 30%  to demonstrate  improved functional abilities. Baseline: see above Goal status: MET  3.  The patient will negotiate 4 steps with reciprocal pattern mod indep. Baseline:  step to pattern Goal status: MET on 10/16/24  4.  The patient will imrpove AROM R knee to 110 degrees flexion. Baseline:  see above Goal status: MET  5.  The patient will improve AROM R knee to full extension. Baseline:  see above Goal status: MET  6.  The patient will ambulate without device mod indep.  Baseline:  RW Goal status: MET on 10/16/24  PLAN:  PT FREQUENCY: 2x/week  PT DURATION: 8 weeks  PLANNED INTERVENTIONS: 97164- PT Re-evaluation, 97750- Physical Performance Testing, 97110-Therapeutic exercises, 97530- Therapeutic activity, 97112- Neuromuscular re-education, 97535- Self Care, 02859- Manual therapy, [phone]- Gait training, [phone]- Aquatic Therapy, [phone]- Electrical stimulation (unattended), 97016- Vasopneumatic device, [phone] (1-2 muscles), [phone] (3+ muscles)- Dry Needling, Patient/Family education, Balance training, Stair training, Taping, Joint mobilization, Cryotherapy, and Moist heat     Lucie Meeter, PT, DPT, ATC 10/26/2024 12:38 PM

## 2024-10-29 ENCOUNTER — Other Ambulatory Visit: Payer: Self-pay | Admitting: Family Medicine

## 2024-10-29 DIAGNOSIS — G609 Hereditary and idiopathic neuropathy, unspecified: Secondary | ICD-10-CM

## 2024-10-30 ENCOUNTER — Ambulatory Visit: Payer: Self-pay | Admitting: Family Medicine

## 2024-10-30 ENCOUNTER — Ambulatory Visit: Admitting: Rehabilitative and Restorative Service Providers"

## 2024-10-30 NOTE — Progress Notes (Signed)
 Please call patient. Normal mammogram.  Repeat in 1 year.

## 2024-11-01 ENCOUNTER — Ambulatory Visit

## 2024-11-06 ENCOUNTER — Ambulatory Visit: Admitting: Rehabilitative and Restorative Service Providers"

## 2024-11-07 ENCOUNTER — Telehealth: Payer: Self-pay

## 2024-11-07 NOTE — Telephone Encounter (Signed)
 Patient came into office to bring her Pre-Op Clearance Form to be completed by PCP, form placed in Dr. Vernida box, thanks.

## 2024-11-08 ENCOUNTER — Ambulatory Visit

## 2024-11-08 NOTE — Telephone Encounter (Signed)
 Form completed,faxed, confirmation received and scanned into pt's chart.

## 2024-11-10 ENCOUNTER — Encounter: Payer: Self-pay | Admitting: Family Medicine

## 2024-11-14 ENCOUNTER — Ambulatory Visit: Admitting: Family Medicine

## 2024-11-27 ENCOUNTER — Encounter: Payer: Self-pay | Admitting: Family Medicine

## 2024-11-27 ENCOUNTER — Ambulatory Visit: Admitting: Family Medicine

## 2024-11-27 VITALS — BP 126/72 | HR 88 | Ht 65.0 in | Wt 189.0 lb

## 2024-11-27 DIAGNOSIS — E1122 Type 2 diabetes mellitus with diabetic chronic kidney disease: Secondary | ICD-10-CM | POA: Diagnosis not present

## 2024-11-27 DIAGNOSIS — N1831 Chronic kidney disease, stage 3a: Secondary | ICD-10-CM | POA: Diagnosis not present

## 2024-11-27 DIAGNOSIS — I10 Essential (primary) hypertension: Secondary | ICD-10-CM

## 2024-11-27 DIAGNOSIS — G4733 Obstructive sleep apnea (adult) (pediatric): Secondary | ICD-10-CM | POA: Diagnosis not present

## 2024-11-27 DIAGNOSIS — E538 Deficiency of other specified B group vitamins: Secondary | ICD-10-CM | POA: Diagnosis not present

## 2024-11-27 LAB — POCT GLYCOSYLATED HEMOGLOBIN (HGB A1C): Hemoglobin A1C: 5.5 % (ref 4.0–5.6)

## 2024-11-27 MED ORDER — CYANOCOBALAMIN 1000 MCG/ML IJ SOLN: 1000.0000 ug | INTRAMUSCULAR | Status: AC

## 2024-11-27 NOTE — Progress Notes (Signed)
 "  Established Patient Office Visit  Patient ID: Alexis Mcneil, female    DOB: 1956-03-03  Age: 69 y.o. MRN: [id number] PCP: [name], MD  Chief Complaint  Patient presents with   Diabetes   B12 Injection    Subjective:     HPI  Discussed the use of AI scribe software for clinical note transcription with the patient, who gave verbal consent to proceed.  History of Present Illness Alexis Mcneil is a 69 year old female with osteoarthritis who presents for follow-up regarding her left knee surgery and ongoing joint issues.  Knee pain and functional limitation - Scheduled for left knee surgery on February 16th - History of right knee surgery with significant improvement, but persistent difficulty squatting - Left knee limits activities and prevents walking long distances - Anticipates improved mobility after both knees are functional  Polyarticular joint pain and stiffness - Ongoing joint symptoms despite methotrexate therapy - Methotrexate started in January (four pills once weekly) after delay due to other medications - Arthritis affects multiple joints, particularly right wrist and ankle - Symptoms described as 'jumping from spot to spot'  Sleep disturbance and daytime fatigue - Uses CPAP machine nightly for sleep apnea (provided by [employer]) - Difficulty maintaining CPAP use for more than four hours due to waking and discomfort - Persistent daytime fatigue and tendency to fall asleep while inactive - No significant improvement in sleep quality since starting CPAP  Unintentional weight loss and decreased appetite - Significant weight loss - Decreased appetite with frequent nibbling on small amounts of food     ROS    Objective:     BP 126/72   Pulse 88   Ht 5' 5 (1.651 m)   Wt 189 lb (85.7 kg)   SpO2 100%   BMI 31.45 kg/m    Physical Exam Vitals and nursing note reviewed.  Constitutional:      Appearance: Normal appearance.  HENT:      Head: Normocephalic and atraumatic.  Eyes:     Conjunctiva/sclera: Conjunctivae normal.  Cardiovascular:     Rate and Rhythm: Normal rate and regular rhythm.  Pulmonary:     Effort: Pulmonary effort is normal.     Breath sounds: Normal breath sounds.  Skin:    General: Skin is warm and dry.  Neurological:     Mental Status: She is alert.  Psychiatric:        Mood and Affect: Mood normal.      Results for orders placed or performed in visit on 11/27/24  POCT HgB A1C  Result Value Ref Range   Hemoglobin A1C 5.5 4.0 - 5.6 %   HbA1c POC (<> result, manual entry)     HbA1c, POC (prediabetic range)     HbA1c, POC (controlled diabetic range)        The 10-year ASCVD risk score (Arnett DK, et al., 2019) is: 18.2%    Assessment & Plan:   Problem List Items Addressed This Visit       Cardiovascular and Mediastinum   HYPERTENSION, BENIGN ESSENTIAL   Relevant Medications   aspirin  EC 81 MG tablet     Respiratory   Obstructive sleep apnea     Endocrine   Type 2 diabetes mellitus with diabetic chronic kidney disease (HCC) - Primary   Relevant Medications   aspirin  EC 81 MG tablet   Other Relevant Orders   POCT HgB A1C (Completed)     Other   B12 deficiency  Relevant Medications   cyanocobalamin  (VITAMIN B12) injection 1,000 mcg (Start on 12/28/2024 12:00 AM)    Assessment and Plan Assessment & Plan Left knee osteoarthritis, planned total knee arthroplasty Left knee osteoarthritis with significant functional impairment. Scheduled for total knee arthroplasty on February 16th. - Proceed with total knee arthroplasty on February 16th. - Discussed medication management preoperatively, including holding GLP-1 agonist the week before surgery and resuming the following week.  Type 2 diabetes mellitus with stage 3a chronic kidney disease Type 2 diabetes mellitus with well-controlled A1c at 5.5. On GLP-1 agonist therapy, contributing to weight loss and decreased appetite. -  Continue GLP-1 agonist therapy, holding the week before surgery and resuming the following week. - Ensure adequate protein intake to prevent muscle atrophy and weakness  Obstructive sleep apnea Managed with CPAP therapy. Reports feeling tired despite usage, possibly due to inadequate duration. - Encouraged consistent use of CPAP for at least four hours per night. - She when she for started the CPAP she noticed a big difference in how she feels though she has still been struggling with fatigue lately - Obtain CPAP usage data from [facility] for documentation.  Inflammatory polyarthritis Managed with methotrexate. Reports some improvement but still experiences joint pain, particularly in the right wrist and ankles. - Continue methotrexate therapy. - Follow up with rheumatologist on March 4th to assess response and consider dose adjustment.   Return in about 4 months (around 03/27/2025).    Dorothyann Byars, MD [facility] at [facility]   "

## 2024-11-27 NOTE — Progress Notes (Signed)
 Pt given monthly B12 injection while in office today.    Pt reports no negative side effects from medication.   Denies any dizziness, chest pain or palpitations, and no GI problems.  Injection given in LD, pt tolerated well. She will RTC in 1 month for next injection.

## 2024-12-12 NOTE — Progress Notes (Signed)
 Sent message, via epic in basket, requesting orders in epic from [facility].

## 2024-12-13 NOTE — Patient Instructions (Signed)
 SURGICAL WAITING ROOM VISITATION Patients having surgery or a procedure may have no more than 2 support people in the waiting area - these visitors may rotate in the visitor waiting room.   Due to an increase in RSV and influenza rates and associated hospitalizations, children ages 2 and under may not visit patients in [facility] hospitals. If the patient needs to stay at the hospital during part of their recovery, the visitor guidelines for inpatient rooms apply.  PRE-OP VISITATION  Pre-op nurse will coordinate an appropriate time for 1 support person to accompany the patient in pre-op.  This support person may not rotate.  This visitor will be contacted when the time is appropriate for the visitor to come back in the pre-op area.  Please refer to the [facility] website for the visitor guidelines for Inpatients (after your surgery is over and you are in a regular room).  You are not required to quarantine at this time prior to your surgery. However, you must do this: Hand Hygiene often Do NOT share personal items Notify your provider if you are in close contact with someone who has COVID or you develop fever 100.4 or greater, new onset of sneezing, cough, sore throat, shortness of breath or body aches.  If you test positive for Covid or have been in contact with anyone that has tested positive in the last 10 days please notify you surgeon.    Your procedure is scheduled on:  12/25/24  Report to [facility] Main Entrance: [place] entrance where the [employer] is available.   Report to admitting at: 8:15 AM  Call this number if you have any questions or problems the morning of surgery [phone]  FOLLOW ANY ADDITIONAL PRE OP INSTRUCTIONS YOU RECEIVED FROM YOUR SURGEON'S OFFICE!!!  Do not eat food after Midnight the night prior to your surgery/procedure.  After Midnight you may have the following liquids until : 7:45 AM DAY OF SURGERY  Clear Liquid Diet Water Black  Coffee (sugar ok, NO MILK/CREAM OR CREAMERS)  Tea (sugar ok, NO MILK/CREAM OR CREAMERS) regular and decaf                             Plain Jell-O  with no fruit (NO RED)                                           Fruit ices (not with fruit pulp, NO RED)                                     Popsicles (NO RED)                                                                  Juice: NO CITRUS JUICES: only apple, WHITE grape, WHITE cranberry Sports drinks like Gatorade or Powerade (NO RED)   The day of surgery:  Drink ONE (1) Pre-Surgery Clear G2 at : 7:45 AM the morning of surgery. Drink in one sitting. Do not sip.  This drink was  given to you during your hospital pre-op appointment visit. Nothing else to drink after completing the Pre-Surgery Clear Ensure or G2 : No candy, chewing gum or throat lozenges.    Oral Hygiene is also important to reduce your risk of infection.        Remember - BRUSH YOUR TEETH THE MORNING OF SURGERY WITH YOUR REGULAR TOOTHPASTE  Do NOT smoke after Midnight the night before surgery.  STOP TAKING all Vitamins, Herbs and supplements 1 week before your surgery.   Take ONLY these medicines the morning of surgery with A SIP OF WATER: duloxetine ,levothyroxine ,pantoprazole .Tylenol  as needed.  If You have been diagnosed with Sleep Apnea - Bring CPAP mask and tubing day of surgery. We will provide you with a CPAP machine on the day of your surgery.                   You may not have any metal on your body including hair pins, jewelry, and body piercing  Do not wear make-up, lotions, powders, perfumes / cologne, or deodorant  Do not wear nail polish including gel and S&S, artificial / acrylic nails, or any other type of covering on natural nails including finger and toenails. If you have artificial nails, gel coating, etc., that needs to be removed by a nail salon, Please have this removed prior to surgery. Not doing so may mean that your surgery could be cancelled or  delayed if the Surgeon or anesthesia staff feels like they are unable to monitor you safely.   Do not shave 48 hours prior to surgery to avoid nicks in your skin which may contribute to postoperative infections.   Contacts, Hearing Aids, dentures or bridgework may not be worn into surgery. DENTURES WILL BE REMOVED PRIOR TO SURGERY PLEASE DO NOT APPLY Poly grip OR ADHESIVES!!!  You may bring a small overnight bag with you on the day of surgery, only pack items that are not valuable. [facility] IS NOT RESPONSIBLE   FOR VALUABLES THAT ARE LOST OR STOLEN.   Patients discharged on the day of surgery will not be allowed to drive home.  Someone NEEDS to stay with you for the first 24 hours after anesthesia.  Do not bring your home medications to the hospital. The Pharmacy will dispense medications listed on your medication list to you during your admission in the Hospital.  Special Instructions: Bring a copy of your healthcare power of attorney and living will documents the day of surgery, if you wish to have them scanned into your [facility] Records- EPIC  Please read over the following fact sheets you were given: IF YOU HAVE QUESTIONS ABOUT YOUR PRE-OP INSTRUCTIONS, PLEASE CALL [phone]  PATIENT SIGNATURE_________________________________  NURSE SIGNATURE__________________________________  ________________________________________________________________________  Pre-operative 4 CHG Bath Instructions  DYNA-Hex 4 Chlorhexidine  Gluconate 4% Solution Antiseptic 4 fl. oz   You can play a key role in reducing the risk of infection after surgery. Your skin needs to be as free of germs as possible. You can reduce the number of germs on your skin by washing with CHG (chlorhexidine  gluconate) soap before surgery. CHG is an antiseptic soap that kills germs and continues to kill germs even after washing.   DO NOT use if you have an allergy  to chlorhexidine /CHG or antibacterial soaps. If your  skin becomes reddened or irritated, stop using the CHG and notify one of our RNs at   Please shower with the CHG soap starting 4 days before surgery using the following schedule:  Please keep in mind the following:  DO NOT shave, including legs and underarms, starting the day of your first shower.   You may shave your face at any point before/day of surgery.  Place clean sheets on your bed the day you start using CHG soap. Use a clean washcloth (not used since being washed) for each shower. DO NOT sleep with pets once you start using the CHG.  CHG Shower Instructions:  If you choose to wash your hair and private area, wash first with your normal shampoo/soap.  After you use shampoo/soap, rinse your hair and body thoroughly to remove shampoo/soap residue.  Turn the water OFF and apply about 3 tablespoons (45 ml) of CHG soap to a CLEAN washcloth.  Apply CHG soap ONLY FROM YOUR NECK DOWN TO YOUR TOES (washing for 3-5 minutes)  DO NOT use CHG soap on face, private areas, open wounds, or sores.  Pay special attention to the area where your surgery is being performed.  If you are having back surgery, having someone wash your back for you may be helpful. Wait 2 minutes after CHG soap is applied, then you may rinse off the CHG soap.  Pat dry with a clean towel  Put on clean clothes/pajamas   If you choose to wear lotion, please use ONLY the CHG-compatible lotions on the back of this paper.     Additional instructions for the day of surgery: DO NOT APPLY any lotions, deodorants, cologne, or perfumes.   Put on clean/comfortable clothes.  Brush your teeth.  Ask your nurse before applying any prescription medications to the skin.   CHG Compatible Lotions   Aveeno Moisturizing lotion  Cetaphil Moisturizing Cream  Cetaphil Moisturizing Lotion  Clairol Herbal Essence Moisturizing Lotion, Dry Skin  Clairol Herbal Essence Moisturizing Lotion, Extra Dry Skin  Clairol Herbal Essence  Moisturizing Lotion, Normal Skin  Curel Age Defying Therapeutic Moisturizing Lotion with Alpha Hydroxy  Curel Extreme Care Body Lotion  Curel Soothing Hands Moisturizing Hand Lotion  Curel Therapeutic Moisturizing Cream, Fragrance-Free  Curel Therapeutic Moisturizing Lotion, Fragrance-Free  Curel Therapeutic Moisturizing Lotion, Original Formula  Eucerin Daily Replenishing Lotion  Eucerin Dry Skin Therapy Plus Alpha Hydroxy Crme  Eucerin Dry Skin Therapy Plus Alpha Hydroxy Lotion  Eucerin Original Crme  Eucerin Original Lotion  Eucerin Plus Crme Eucerin Plus Lotion  Eucerin TriLipid Replenishing Lotion  Keri Anti-Bacterial Hand Lotion  Keri Deep Conditioning Original Lotion Dry Skin Formula Softly Scented  Keri Deep Conditioning Original Lotion, Fragrance Free Sensitive Skin Formula  Keri Lotion Fast Absorbing Fragrance Free Sensitive Skin Formula  Keri Lotion Fast Absorbing Softly Scented Dry Skin Formula  Keri Original Lotion  Keri Skin Renewal Lotion Keri Silky Smooth Lotion  Keri Silky Smooth Sensitive Skin Lotion  Nivea Body Creamy Conditioning Oil  Nivea Body Extra Enriched Lotion  Nivea Body Original Lotion  Nivea Body Sheer Moisturizing Lotion Nivea Crme  Nivea Skin Firming Lotion  NutraDerm 30 Skin Lotion  NutraDerm Skin Lotion  NutraDerm Therapeutic Skin Cream  NutraDerm Therapeutic Skin Lotion  ProShield Protective Hand Cream  Provon moisturizing lotion  Incentive Spirometer  An incentive spirometer is a tool that can help keep your lungs clear and active. This tool measures how well you are filling your lungs with each breath. Taking long deep breaths may help reverse or decrease the chance of developing breathing (pulmonary) problems (especially infection) following: A long period of time when you are unable to move or be active. BEFORE THE PROCEDURE  If  the spirometer includes an indicator to show your best effort, your nurse or respiratory therapist will set  it to a desired goal. If possible, sit up straight or lean slightly forward. Try not to slouch. Hold the incentive spirometer in an upright position. INSTRUCTIONS FOR USE  Sit on the edge of your bed if possible, or sit up as far as you can in bed or on a chair. Hold the incentive spirometer in an upright position. Breathe out normally. Place the mouthpiece in your mouth and seal your lips tightly around it. Breathe in slowly and as deeply as possible, raising the piston or the ball toward the top of the column. Hold your breath for 3-5 seconds or for as long as possible. Allow the piston or ball to fall to the bottom of the column. Remove the mouthpiece from your mouth and breathe out normally. Rest for a few seconds and repeat Steps 1 through 7 at least 10 times every 1-2 hours when you are awake. Take your time and take a few normal breaths between deep breaths. The spirometer may include an indicator to show your best effort. Use the indicator as a goal to work toward during each repetition. After each set of 10 deep breaths, practice coughing to be sure your lungs are clear. If you have an incision (the cut made at the time of surgery), support your incision when coughing by placing a pillow or rolled up towels firmly against it. Once you are able to get out of bed, walk around indoors and cough well. You may stop using the incentive spirometer when instructed by your caregiver.  RISKS AND COMPLICATIONS Take your time so you do not get dizzy or light-headed. If you are in pain, you may need to take or ask for pain medication before doing incentive spirometry. It is harder to take a deep breath if you are having pain. AFTER USE Rest and breathe slowly and easily. It can be helpful to keep track of a log of your progress. Your caregiver can provide you with a simple table to help with this. If you are using the spirometer at home, follow these instructions: SEEK MEDICAL CARE IF:  You are  having difficultly using the spirometer. You have trouble using the spirometer as often as instructed. Your pain medication is not giving enough relief while using the spirometer. You develop fever of 100.5 F (38.1 C) or higher. SEEK IMMEDIATE MEDICAL CARE IF:  You cough up bloody sputum that had not been present before. You develop fever of 102 F (38.9 C) or greater. You develop worsening pain at or near the incision site. MAKE SURE YOU:  Understand these instructions. Will watch your condition. Will get help right away if you are not doing well or get worse. Document Released: 03/08/2007 Document Revised: 01/18/2012 Document Reviewed: 05/09/2007 [facility] Patient Information 2014 [place], [place].   ________________________________________________________________________

## 2024-12-14 ENCOUNTER — Other Ambulatory Visit: Payer: Self-pay

## 2024-12-14 ENCOUNTER — Ambulatory Visit: Payer: Self-pay | Admitting: Emergency Medicine

## 2024-12-14 ENCOUNTER — Encounter (HOSPITAL_COMMUNITY)
Admission: RE | Admit: 2024-12-14 | Discharge: 2024-12-14 | Attending: Orthopedic Surgery | Admitting: Orthopedic Surgery

## 2024-12-14 ENCOUNTER — Encounter (HOSPITAL_COMMUNITY): Payer: Self-pay

## 2024-12-14 ENCOUNTER — Encounter: Payer: Self-pay | Admitting: Family Medicine

## 2024-12-14 VITALS — BP 126/71 | HR 86 | Temp 98.3°F | Ht 65.0 in | Wt 181.0 lb

## 2024-12-14 DIAGNOSIS — Z01818 Encounter for other preprocedural examination: Secondary | ICD-10-CM

## 2024-12-14 DIAGNOSIS — G8929 Other chronic pain: Secondary | ICD-10-CM

## 2024-12-14 DIAGNOSIS — I10 Essential (primary) hypertension: Secondary | ICD-10-CM

## 2024-12-14 DIAGNOSIS — E1122 Type 2 diabetes mellitus with diabetic chronic kidney disease: Secondary | ICD-10-CM

## 2024-12-14 HISTORY — DX: Hypothyroidism, unspecified: E03.9

## 2024-12-14 LAB — COMPREHENSIVE METABOLIC PANEL WITH GFR
ALT: 16 U/L (ref 0–44)
AST: 19 U/L (ref 15–41)
Albumin: 4.1 g/dL (ref 3.5–5.0)
Alkaline Phosphatase: 111 U/L (ref 38–126)
Anion gap: 11 (ref 5–15)
BUN: 19 mg/dL (ref 8–23)
CO2: 28 mmol/L (ref 22–32)
Calcium: 10.6 mg/dL — ABNORMAL HIGH (ref 8.9–10.3)
Chloride: 98 mmol/L (ref 98–111)
Creatinine, Ser: 0.95 mg/dL (ref 0.44–1.00)
GFR, Estimated: >60 mL/min
Glucose, Bld: 115 mg/dL — ABNORMAL HIGH (ref 70–99)
Potassium: 3.6 mmol/L (ref 3.5–5.1)
Sodium: 137 mmol/L (ref 135–145)
Total Bilirubin: 0.7 mg/dL (ref 0.0–1.2)
Total Protein: 7.6 g/dL (ref 6.5–8.1)

## 2024-12-14 LAB — CBC WITH DIFFERENTIAL/PLATELET
Abs Immature Granulocytes: 0.03 10*3/uL (ref 0.00–0.07)
Basophils Absolute: 0.1 10*3/uL (ref 0.0–0.1)
Basophils Relative: 0 %
Eosinophils Absolute: 0.3 10*3/uL (ref 0.0–0.5)
Eosinophils Relative: 2 %
HCT: 35.9 % — ABNORMAL LOW (ref 36.0–46.0)
Hemoglobin: 11.5 g/dL — ABNORMAL LOW (ref 12.0–15.0)
Immature Granulocytes: 0 %
Lymphocytes Relative: 9 %
Lymphs Abs: 1.1 10*3/uL (ref 0.7–4.0)
MCH: 28.4 pg (ref 26.0–34.0)
MCHC: 32 g/dL (ref 30.0–36.0)
MCV: 88.6 fL (ref 80.0–100.0)
Monocytes Absolute: 0.7 10*3/uL (ref 0.1–1.0)
Monocytes Relative: 6 %
Neutro Abs: 9.8 10*3/uL — ABNORMAL HIGH (ref 1.7–7.7)
Neutrophils Relative %: 83 %
Platelets: 372 10*3/uL (ref 150–400)
RBC: 4.05 MIL/uL (ref 3.87–5.11)
RDW: 16.8 % — ABNORMAL HIGH (ref 11.5–15.5)
WBC: 12 10*3/uL — ABNORMAL HIGH (ref 4.0–10.5)
nRBC: 0 % (ref 0.0–0.2)

## 2024-12-14 LAB — SURGICAL PCR SCREEN
MRSA, PCR: NEGATIVE
Staphylococcus aureus: NEGATIVE

## 2024-12-14 LAB — GLUCOSE, CAPILLARY: Glucose-Capillary: 116 mg/dL — ABNORMAL HIGH (ref 70–99)

## 2024-12-14 LAB — TYPE AND SCREEN
ABO/RH(D): A POS
Antibody Screen: NEGATIVE

## 2024-12-14 NOTE — H&P (Signed)
 TOTAL KNEE ADMISSION H&P  Patient is being admitted for left total knee arthroplasty.  Subjective:  Chief Complaint:left knee pain.  HPI: Alexis Mcneil, 69 y.o. female, has a history of pain and functional disability in the left knee due to arthritis and has failed non-surgical conservative treatments for greater than 12 weeks to includeNSAID's and/or analgesics, flexibility and strengthening excercises, and activity modification.  Onset of symptoms was gradual, starting several years ago with gradually worsening course since that time. The patient noted no past surgery on the left knee(s).  Patient currently rates pain in the left knee(s) at 10 out of 10 with activity. Patient has night pain, worsening of pain with activity and weight bearing, pain that interferes with activities of daily living, and pain with passive range of motion.  Patient has evidence of periarticular osteophytes and joint space narrowing by imaging studies. There is no active infection.  Patient Active Problem List   Diagnosis Date Noted   Polyarthritis with positive rheumatoid factor (HCC) 10/11/2024   Localized osteoarthritis of right knee 09/20/2024   B12 deficiency 08/17/2024   Primary osteoarthritis of right knee 12/04/2021   CKD (chronic kidney disease) stage 3, GFR 30-59 ml/min (HCC) 11/12/2021   Pre-ulcerative corn or callous 05/20/2020   Excessive sweating 05/20/2020   Gout 03/07/2018   Hereditary and idiopathic peripheral neuropathy 09/24/2015   Trochanteric bursitis of both hips 10/08/2014   Lumbar spinal stenosis 09/06/2014   Right knee pain 09/06/2014   Obstructive sleep apnea 09/26/2010   Left rotator cuff tear 03/18/2010   PARESTHESIA, HANDS 03/18/2010   Type 2 diabetes mellitus with diabetic chronic kidney disease (HCC) 02/07/2008   Hypothyroidism 01/06/2007   Hyperlipidemia 01/06/2007   ANEMIA NOS 01/06/2007   HYPERTENSION, BENIGN ESSENTIAL 01/06/2007   Past Medical History:  Diagnosis  Date   Anemia    Arthritis    KNEE,SHOULDERS   Cataract January 2021   Eye appt visit   Diabetes mellitus without complication (HCC)    GERD (gastroesophageal reflux disease)    Heart murmur    Hyperlipidemia    Hypertension    IFG (impaired fasting glucose)    Neuromuscular disorder (HCC)    NEUROPATHY FEET   OSA (obstructive sleep apnea)    CPAP   Sleep apnea    CPAP   Thyroid  disease     Past Surgical History:  Procedure Laterality Date   COLONOSCOPY     endometrial polyp     January 2014   EYE SURGERY  1988   Cyst on eye removed   mouth palate surgery repair  1961   large tear   ROOT CANEL     2 WEEKS AGO   rt knee surgery arthroscopy  2009   torn meniscus/medial   torn ligament shoulder  1975   repaired   TOTAL KNEE ARTHROPLASTY Right 09/20/2024   Procedure: ARTHROPLASTY, KNEE, TOTAL;  Surgeon: Edna Toribio LABOR, MD;  Location: [facility]S;  Service: Orthopedics;  Laterality: Right;    Current Outpatient Medications  Medication Sig Dispense Refill Last Dose/Taking   acetaminophen  (TYLENOL ) 500 MG tablet Take 500-1,000 mg by mouth every 6 (six) hours as needed (pain.).      allopurinol  (ZYLOPRIM ) 300 MG tablet Take 0.5 tablets (150 mg total) by mouth every evening. 45 tablet 3    AMBULATORY NON FORMULARY MEDICATION Medication Name: CPAP machine and supplies with humidifier.  Please set her CPAP to 9 cm water pressure. Then download after one week so can review her  apneas on 9 cm water pressure.  [facility]. Dx. OSA G47.33 1 vial 0    aspirin  EC 81 MG tablet Take 81 mg by mouth daily. Swallow whole.      atorvastatin  (LIPITOR) 40 MG tablet TAKE 1 TABLET BY MOUTH IN THE  EVENING AT 6 PM 90 tablet 3    chlorthalidone  (HYGROTON ) 25 MG tablet Take 1 tablet (25 mg total) by mouth daily. (Patient taking differently: Take 12.5 mg by mouth in the morning.) 1 tablet     DULoxetine  (CYMBALTA ) 60 MG capsule TAKE 1 CAPSULE BY MOUTH DAILY 90 capsule 3    folic acid  (FOLVITE ) 1  MG tablet Take 1 mg by mouth in the morning.      Lancets Misc. (ACCU-CHEK FASTCLIX LANCET) KIT Use to check blood sugar once daily. Dx code: E11.69 1 kit 1    levothyroxine  (SYNTHROID ) 137 MCG tablet Take 1 tablet (137 mcg total) by mouth daily before breakfast. 90 tablet 1    losartan  (COZAAR ) 100 MG tablet TAKE 1 TABLET BY MOUTH ONCE  DAILY 90 tablet 3    MAGNESIUM  OXIDE PO Take 400 mg by mouth in the morning.      meloxicam  (MOBIC ) 15 MG tablet Take 15 mg by mouth in the morning.      methotrexate (RHEUMATREX) 2.5 MG tablet Take 10 mg by mouth every Monday. In the morning.      pantoprazole  (PROTONIX ) 40 MG tablet TAKE 1 TABLET BY MOUTH DAILY 90 tablet 3    polyethylene glycol (MIRALAX ) 17 g packet Take 17 g by mouth daily. (Patient taking differently: Take 17 g by mouth daily as needed (constipation).) 14 each 0    potassium chloride  SA (KLOR-CON  M) 20 MEQ tablet Take 2 tablets (40 mEq total) by mouth daily. (Patient taking differently: Take 20 mEq by mouth 2 (two) times daily.) 90 tablet 3    Pyridoxine HCl (B-6 PO) Take 1 tablet by mouth in the morning.      Semaglutide , 2 MG/DOSE, 8 MG/3ML SOPN Inject 2 mg as directed once a week. (Patient taking differently: Inject 2 mg into the skin every Sunday.) 9 mL 2    Current Facility-Administered Medications  Medication Dose Route Frequency Provider Last Rate Last Admin   cyanocobalamin  (VITAMIN B12) injection 1,000 mcg  1,000 mcg Intramuscular Q30 days Metheney, Catherine D, MD   1,000 mcg at 11/27/24 1051   [START ON 12/28/2024] cyanocobalamin  (VITAMIN B12) injection 1,000 mcg  1,000 mcg Intramuscular Q30 days [name], MD       Allergies[1]  Social History   Tobacco Use   Smoking status: Never   Smokeless tobacco: Never  Substance Use Topics   Alcohol  use: Not Currently    Alcohol /week: 2.0 standard drinks of alcohol     Types: 2 Cans of beer per week    Comment: occasional beer on the weekends.      Family History   Problem Relation Age of Onset   Hyperlipidemia Mother        Living   Hypertension Mother    Arthritis Mother    Colon cancer Father 27       colon/ polyps   Heart attack Father    Stroke Father    Cancer Father    Diabetes Father    Supraventricular tachycardia Sister    Cancer Paternal Aunt    Cancer Paternal Uncle    Hyperlipidemia Maternal Grandmother    Varicose Veins Maternal Grandmother    Colon  cancer Paternal Grandmother    Stroke Paternal Grandmother    Heart attack Paternal Grandmother        heart attack   Cancer Paternal Grandmother    Diabetes Paternal Grandmother    Diabetes Other        grandmother   Esophageal cancer Neg Hx    Stomach cancer Neg Hx    Rectal cancer Neg Hx    Breast cancer Neg Hx      Review of Systems  Musculoskeletal:  Positive for arthralgias.  All other systems reviewed and are negative.   Objective:  Physical Exam Constitutional:      General: She is not in acute distress.    Appearance: Normal appearance. She is not ill-appearing.  HENT:     Head: Normocephalic and atraumatic.     Right Ear: External ear normal.     Left Ear: External ear normal.     Nose: Nose normal.     Mouth/Throat:     Mouth: Mucous membranes are moist.     Pharynx: Oropharynx is clear.  Eyes:     Extraocular Movements: Extraocular movements intact.     Conjunctiva/sclera: Conjunctivae normal.  Cardiovascular:     Rate and Rhythm: Normal rate.     Pulses: Normal pulses.  Pulmonary:     Effort: Pulmonary effort is normal.  Abdominal:     General: Bowel sounds are normal.     Palpations: Abdomen is soft.  Musculoskeletal:        General: Tenderness present.     Cervical back: Normal range of motion and neck supple.     Comments: TTP over medial and lateral joint line, medial worse than lateral.  No calf tenderness, swelling, or erythema.  No overlying lesions of area of chief complaint.  Decreased strength and ROM due to elicited pain.   Dorsiflexion and plantarflexion intact.  Stable to varus and valgus stress.  BLE appear grossly neurovascularly intact.  Gait mildly antalgic.   Skin:    General: Skin is warm and dry.  Neurological:     Mental Status: She is alert and oriented to person, place, and time. Mental status is at baseline.  Psychiatric:        Mood and Affect: Mood normal.        Behavior: Behavior normal.     Vital signs in last 24 hours: @VSRANGES @  Labs:   Estimated body mass index is 31.45 kg/m as calculated from the following:   Height as of 11/27/24: 5' 5 (1.651 m).   Weight as of 11/27/24: 85.7 kg.   Imaging Review Plain radiographs demonstrate severe degenerative joint disease of the left knee(s). The overall alignment issignificant varus. The bone quality appears to be fair for age and reported activity level.      Assessment/Plan:  End stage arthritis, left knee   The patient history, physical examination, clinical judgment of the provider and imaging studies are consistent with end stage degenerative joint disease of the left knee(s) and total knee arthroplasty is deemed medically necessary. The treatment options including medical management, injection therapy arthroscopy and arthroplasty were discussed at length. The risks and benefits of total knee arthroplasty were presented and reviewed. The risks due to aseptic loosening, infection, stiffness, patella tracking problems, thromboembolic complications and other imponderables were discussed. The patient acknowledged the explanation, agreed to proceed with the plan and consent was signed. Patient is being admitted for inpatient treatment for surgery, pain control, PT, OT, prophylactic antibiotics, VTE prophylaxis,  progressive ambulation and ADL's and discharge planning. The patient is planning to be discharged home with OPPT    Anticipated LOS equal to or greater than 2 midnights due to - Age 66 and older with one or more of the  following:  - Obesity  - Expected need for hospital services (PT, OT, Nursing) required for safe  discharge  - Anticipated need for postoperative skilled nursing care or inpatient rehab  - Active co-morbidities: geriatric, diabetes with neuropathy, HLD, HTN, CKD stage 3, OSA w/ CPAP, gout, rheumatoid arthritis OR   - Unanticipated findings during/Post Surgery: None  - Patient is a high risk of re-admission due to: None    [1]  Allergies Allergen Reactions   Gabapentin  Other (See Comments)    Vision changes   Niacin Other (See Comments)    Threw up and passed out   Niacin And Related Nausea And Vomiting    Passed out and threw up

## 2024-12-14 NOTE — Progress Notes (Signed)
 For Anesthesia: PCP - Dorothyann Byars, MD  Cardiologist - N/A  Bowel Prep reminder:  Chest x-ray -  EKG - 09/11/24 Epic/chart  Stress Test -  ECHO -  Cardiac Cath -  Pacemaker/ICD device last checked: Pacemaker orders received: Device Rep notified:  Spinal Cord Stimulator:N/A  Sleep Study - Yes CPAP - Yes  Fasting Blood Sugar - 100's Checks Blood Sugar __1___ times a day Date and result of last Hgb A1c-5.5: 11/27/24  Last dose of GLP1 agonist- Ozempic : will be on hold after: 12/17/24 GLP1 instructions: Hold 7 days prior to schedule (Hold 24 hours-daily)   Last dose of SGLT-2 inhibitors- N/A SGLT-2 instructions: Hold 72 hours prior to surgery  Blood Thinner Instructions:N/A Last Dose: Time last taken:  Aspirin  Instructions: Last Dose: Time last taken:  Activity level: Can go up a flight of stairs and activities of daily living without stopping and without chest pain and/or shortness of breath   Able to exercise without chest pain and/or shortness of breath  Anesthesia review: Hx: HTN,Murmur,DIA,OSA(CPAP)  Patient denies shortness of breath, fever, cough and chest pain at PAT appointment   Patient verbalized understanding of instructions that were reviewed over the telephone.

## 2024-12-15 MED ORDER — ATORVASTATIN CALCIUM 40 MG PO TABS: 60.0000 mg | ORAL_TABLET | Freq: Every day | ORAL | 1 refills | Status: AC

## 2024-12-25 ENCOUNTER — Encounter (HOSPITAL_COMMUNITY): Admission: RE | Payer: Self-pay | Source: Home / Self Care

## 2024-12-25 ENCOUNTER — Ambulatory Visit (HOSPITAL_COMMUNITY): Admission: RE | Admit: 2024-12-25 | Admitting: Orthopedic Surgery

## 2024-12-28 ENCOUNTER — Ambulatory Visit

## 2024-12-28 ENCOUNTER — Ambulatory Visit: Attending: Family Medicine | Admitting: Rehabilitative and Restorative Service Providers"

## 2025-03-27 ENCOUNTER — Ambulatory Visit: Admitting: Family Medicine

## 2025-09-27 ENCOUNTER — Ambulatory Visit
